# Patient Record
Sex: Female | Born: 1955
Health system: Southern US, Community
[De-identification: ages and names within clinical notes are randomized; demographics above are authoritative.]

## PROBLEM LIST (undated history)

## (undated) DIAGNOSIS — I214 Non-ST elevation (NSTEMI) myocardial infarction: Secondary | ICD-10-CM

## (undated) DIAGNOSIS — G35D Multiple sclerosis, unspecified: Secondary | ICD-10-CM

## (undated) DIAGNOSIS — Z86010 Personal history of colonic polyps: Secondary | ICD-10-CM

## (undated) DIAGNOSIS — C4491 Basal cell carcinoma of skin, unspecified: Secondary | ICD-10-CM

## (undated) DIAGNOSIS — R011 Cardiac murmur, unspecified: Secondary | ICD-10-CM

## (undated) DIAGNOSIS — E039 Hypothyroidism, unspecified: Secondary | ICD-10-CM

## (undated) DIAGNOSIS — Z87891 Personal history of nicotine dependence: Secondary | ICD-10-CM

## (undated) DIAGNOSIS — E119 Type 2 diabetes mellitus without complications: Secondary | ICD-10-CM

## (undated) DIAGNOSIS — Z720 Tobacco use: Secondary | ICD-10-CM

## (undated) DIAGNOSIS — I619 Nontraumatic intracerebral hemorrhage, unspecified: Secondary | ICD-10-CM

## (undated) DIAGNOSIS — G35 Multiple sclerosis: Secondary | ICD-10-CM

## (undated) DIAGNOSIS — B029 Zoster without complications: Secondary | ICD-10-CM

## (undated) DIAGNOSIS — IMO0002 Reserved for concepts with insufficient information to code with codable children: Secondary | ICD-10-CM

## (undated) DIAGNOSIS — E049 Nontoxic goiter, unspecified: Secondary | ICD-10-CM

## (undated) DIAGNOSIS — I10 Essential (primary) hypertension: Secondary | ICD-10-CM

## (undated) DIAGNOSIS — C4492 Squamous cell carcinoma of skin, unspecified: Secondary | ICD-10-CM

## (undated) HISTORY — DX: Multiple sclerosis, unspecified: G35.D

## (undated) HISTORY — DX: Essential (primary) hypertension: I10

## (undated) HISTORY — PX: TONSILLECTOMY: SUR1361

## (undated) HISTORY — DX: Basal cell carcinoma of skin, unspecified: C44.91

## (undated) HISTORY — DX: Type 2 diabetes mellitus without complications: E11.9

## (undated) HISTORY — DX: Reserved for concepts with insufficient information to code with codable children: IMO0002

## (undated) HISTORY — DX: Non-ST elevation (NSTEMI) myocardial infarction: I21.4

## (undated) HISTORY — DX: Personal history of colonic polyps: Z86.010

## (undated) HISTORY — PX: CORONARY ANGIOPLASTY WITH STENT PLACEMENT: SHX49

## (undated) HISTORY — PX: KNEE SURGERY: SHX244

## (undated) HISTORY — DX: Squamous cell carcinoma of skin, unspecified: C44.92

## (undated) HISTORY — DX: Zoster without complications: B02.9

## (undated) HISTORY — DX: Nontoxic goiter, unspecified: E04.9

## (undated) HISTORY — DX: Personal history of nicotine dependence: Z87.891

## (undated) HISTORY — DX: Tobacco use: Z72.0

## (undated) HISTORY — PX: VESICOVAGINAL FISTULA CLOSURE W/ TAH: SUR271

## (undated) HISTORY — DX: Hypothyroidism, unspecified: E03.9

## (undated) HISTORY — PX: OTHER SURGICAL HISTORY: SHX169

## (undated) HISTORY — DX: Multiple sclerosis: G35

---

## 2003-08-15 ENCOUNTER — Other Ambulatory Visit: Payer: Self-pay

## 2004-07-09 ENCOUNTER — Ambulatory Visit: Payer: Self-pay | Admitting: Unknown Physician Specialty

## 2005-07-12 ENCOUNTER — Ambulatory Visit: Payer: Self-pay | Admitting: Unknown Physician Specialty

## 2005-11-10 ENCOUNTER — Ambulatory Visit: Payer: Self-pay | Admitting: Unknown Physician Specialty

## 2005-11-12 ENCOUNTER — Ambulatory Visit: Payer: Self-pay | Admitting: Unknown Physician Specialty

## 2006-01-13 ENCOUNTER — Encounter: Admission: RE | Admit: 2006-01-13 | Discharge: 2006-01-13 | Payer: Self-pay | Admitting: Psychiatry

## 2006-07-14 ENCOUNTER — Ambulatory Visit: Payer: Self-pay | Admitting: Unknown Physician Specialty

## 2007-07-25 ENCOUNTER — Ambulatory Visit: Payer: Self-pay | Admitting: Unknown Physician Specialty

## 2008-07-30 ENCOUNTER — Ambulatory Visit: Payer: Self-pay | Admitting: Unknown Physician Specialty

## 2009-06-19 ENCOUNTER — Encounter: Payer: Self-pay | Admitting: Cardiovascular Disease

## 2009-07-02 HISTORY — PX: OTHER SURGICAL HISTORY: SHX169

## 2009-08-02 HISTORY — PX: CORONARY ANGIOPLASTY WITH STENT PLACEMENT: SHX49

## 2009-08-07 ENCOUNTER — Ambulatory Visit: Payer: Self-pay | Admitting: Unknown Physician Specialty

## 2009-09-29 ENCOUNTER — Ambulatory Visit: Payer: Self-pay | Admitting: Family Medicine

## 2009-09-29 ENCOUNTER — Ambulatory Visit: Payer: Self-pay | Admitting: Cardiovascular Disease

## 2009-09-29 ENCOUNTER — Inpatient Hospital Stay (HOSPITAL_COMMUNITY): Admission: EM | Admit: 2009-09-29 | Discharge: 2009-10-01 | Payer: Self-pay | Admitting: Emergency Medicine

## 2009-09-29 DIAGNOSIS — I214 Non-ST elevation (NSTEMI) myocardial infarction: Secondary | ICD-10-CM

## 2009-09-29 HISTORY — DX: Non-ST elevation (NSTEMI) myocardial infarction: I21.4

## 2009-10-03 ENCOUNTER — Encounter: Payer: Self-pay | Admitting: Cardiovascular Disease

## 2009-10-15 DIAGNOSIS — E039 Hypothyroidism, unspecified: Secondary | ICD-10-CM | POA: Insufficient documentation

## 2009-10-15 DIAGNOSIS — I10 Essential (primary) hypertension: Secondary | ICD-10-CM | POA: Insufficient documentation

## 2009-10-15 DIAGNOSIS — G35 Multiple sclerosis: Secondary | ICD-10-CM | POA: Insufficient documentation

## 2009-10-15 DIAGNOSIS — E119 Type 2 diabetes mellitus without complications: Secondary | ICD-10-CM | POA: Insufficient documentation

## 2009-10-15 DIAGNOSIS — E049 Nontoxic goiter, unspecified: Secondary | ICD-10-CM | POA: Insufficient documentation

## 2009-10-23 ENCOUNTER — Ambulatory Visit: Payer: Self-pay | Admitting: Cardiovascular Disease

## 2009-10-23 DIAGNOSIS — E782 Mixed hyperlipidemia: Secondary | ICD-10-CM | POA: Insufficient documentation

## 2009-10-23 DIAGNOSIS — R0602 Shortness of breath: Secondary | ICD-10-CM | POA: Insufficient documentation

## 2009-10-23 DIAGNOSIS — I2510 Atherosclerotic heart disease of native coronary artery without angina pectoris: Secondary | ICD-10-CM | POA: Insufficient documentation

## 2009-10-23 DIAGNOSIS — R011 Cardiac murmur, unspecified: Secondary | ICD-10-CM | POA: Insufficient documentation

## 2009-10-28 ENCOUNTER — Encounter: Payer: Self-pay | Admitting: Cardiovascular Disease

## 2009-11-06 ENCOUNTER — Telehealth: Payer: Self-pay | Admitting: Cardiovascular Disease

## 2009-12-11 ENCOUNTER — Telehealth (INDEPENDENT_AMBULATORY_CARE_PROVIDER_SITE_OTHER): Payer: Self-pay

## 2009-12-15 ENCOUNTER — Encounter (HOSPITAL_COMMUNITY): Admission: RE | Admit: 2009-12-15 | Discharge: 2009-12-15 | Payer: Self-pay | Admitting: Cardiovascular Disease

## 2009-12-15 ENCOUNTER — Ambulatory Visit: Payer: Self-pay | Admitting: Internal Medicine

## 2009-12-15 ENCOUNTER — Ambulatory Visit: Payer: Self-pay

## 2010-01-01 ENCOUNTER — Encounter: Payer: Self-pay | Admitting: Cardiovascular Disease

## 2010-01-13 ENCOUNTER — Ambulatory Visit: Payer: Self-pay | Admitting: Cardiovascular Disease

## 2010-07-16 ENCOUNTER — Encounter: Payer: Self-pay | Admitting: Cardiovascular Disease

## 2010-07-22 ENCOUNTER — Ambulatory Visit: Payer: Self-pay | Admitting: Cardiovascular Disease

## 2010-08-02 DIAGNOSIS — Z860100 Personal history of colon polyps, unspecified: Secondary | ICD-10-CM

## 2010-08-02 HISTORY — DX: Personal history of colon polyps, unspecified: Z86.0100

## 2010-08-02 HISTORY — DX: Personal history of colonic polyps: Z86.010

## 2010-08-10 ENCOUNTER — Ambulatory Visit: Payer: Self-pay | Admitting: Unknown Physician Specialty

## 2010-09-01 NOTE — Assessment & Plan Note (Signed)
Summary: per check out/sf   Visit Type:  Follow-up  CC:  c/o chest burning .  History of Present Illness: Daisy Bennett is seen today post hospital D/C.  She had a non ST elevation MI with subsequent stenting of an OM.  She has diffuse 3VD with a total right that is collateralized.  She had smoewhat atypical presentation with her DM.  She had a myriad of questions.  I told her to wait on her final dental implant surgery fo r3 months but a cleaning was ok.  She has a benign systolic murmur and does not need SBE prophylaxis.  She does not want to do cardiac rehab.  She is walking two miles/day and is motivated so I think this is ok.  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  She continues to have some exertional dyspnea. Recent myovue reviewed and showed no significant ishcemia.  .  She had labs at Dr Elige Ko office and they were reviewed.  HbA1c 6.0.  LDL 100 LFT's ok and TSH mildly elevated now on low dose synthroid. She denies SSCP, edema, palpitations.  Her cath site has healed well  She has a lot of gas with fish oil and I told her to try a different formulations  Current Problems (verified): 1)  Cardiac Murmur  (ICD-785.2) 2)  Mixed Hyperlipidemia  (ICD-272.2) 3)  Cad  (ICD-414.00) 4)  Dyspnea  (ICD-786.05) 5)  Multiple Sclerosis  (ICD-340) 6)  Hypertension  (ICD-401.9) 7)  Dm  (ICD-250.00) 8)  Goiter  (ICD-240.9) 9)  Hypothyroidism  (ICD-244.9)  Current Medications (verified): 1)  Lotrel 10-20 Mg Caps (Amlodipine Besy-Benazepril Hcl) .[name].. 1 Tab By Mouth Once Daily 2)  Metformin Hcl 500 Mg Tabs (Metformin Hcl) .[name].. 1 Tab By Mouth Once Daily 3)  Levothroid 100 Mcg Tabs (Levothyroxine Sodium) .[name].. 1 Tab By Mouth Once Daily 4)  Neurontin 600 Mg Tabs (Gabapentin) .[name].. 1  Tab By Mouth Three Times A Day 5)  Amitriptyline Hcl 50 Mg Tabs (Amitriptyline Hcl) .[name].. 1 Once Daily 6)  Carvedilol 3.125 Mg Tabs (Carvedilol) .... Take One Tablet By Mouth Twice A Day 7)  Crestor 10 Mg  Tabs (Rosuvastatin Calcium) .... Take One Tablet By Mouth Daily. 8)  Aspirin Ec 325 Mg Tbec (Aspirin) .... Take One Tablet By Mouth Daily 9)  Effient 10 Mg Tabs (Prasugrel Hcl) .[name].. 1 Tab By Mouth Once Daily 10)  Nitrostat 0.4 Mg Subl (Nitroglycerin) .[name].. 1 Tablet Under Tongue At Onset of Chest Pain; You May Repeat Every 5 Minutes For Up To 3 Doses. 11)  Multivitamins   Tabs (Multiple Vitamin) .[name].. 1 Tab By Mouth Once Daily 12)  Prilosec Otc 20 Mg Tbec (Omeprazole Magnesium) .[name].. 1 Tab By Mouth Once Daily 13)  Fish Oil   Oil (Fish Oil) .[name].. 1  Tab By Mouth Once Daily 14)  Cinnamon 2000 Mg Caps (Cinnamon) .... Daily 15)  Effexor Xr 37.5 Mg Xr24h-Cap (Venlafaxine Hcl) .[name].. 1  Once Daily 16)  Betaseron 0.3 Mg Solr (Interferon Beta-1b) .... 0.25 Mg Every Other Day Inj Subq  Allergies (verified): No Known Drug Allergies  Past History:  Past Medical History: Last updated: 10/15/2009 Current Problems:  SEMI: 09/29/09:  Total RCA with stent to OM branch. MULTIPLE SCLEROSIS (ICD-340) HYPERTENSION (ICD-401.9) DM (ICD-250.00) GOITER (ICD-240.9) HYPOTHYROIDISM (ICD-244.9) Non-ST-segment elevation myocardial infarction.   Past Surgical History: Last updated: 10/15/2009  The patient did have cataract surgery in  December 2010.   She also had  a hysterectomy  2 C-sections  tonsillectomy.      Family History: Last updated: 10/15/2009   Her mother has hypertension.  Father is deceased   recently within the last 2 weeks, died of congestive heart failure   complications.  She also has a brother who has multiple aneurysms in his   body, which include his thoracic aneurysm and abdominal aneurysm.      REVIEW OF SYSTEMS:  The patient was only positive for what was said in   HPI.      Social History: Last updated: 10/15/2009  The patient lives with husband.  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  She does not smoke, quit   more than 2 years ago, but does have a  history of smoking 1 pack per day   off and on for approximately 10 years.  Occasional alcohol use and no   drugs.   Review of Systems       Denies fever, malais, weight loss, blurry vision, decreased visual acuity, cough, sputum, SOB, hemoptysis, pleuritic pain, palpitaitons, heartburn, abdominal pain, melena, lower extremity edema, claudication, or rash.   Vital Signs:  Patient profile:   55 year old female Height:      66 inches Weight:      187 pounds BMI:     30.29 Pulse rate:   69 / minute Pulse rhythm:   regular BP sitting:   154 / 80  (left arm) Cuff size:   large  Vitals Entered By: Scherrie Bateman, LPN (January 13, 2010 2:02 PM)  Physical Exam  General:  Affect appropriate Healthy:  appears stated age HEENT: normal Neck supple with no adenopathy JVP normal no bruits no thyromegaly Lungs clear with no wheezing and good diaphragmatic motion Heart:  S1/S2 sytolic  murmur no ,rub, gallop or click PMI normal Abdomen: benighn, BS positve, no tenderness, no AAA no bruit.  No HSM or HJR Distal pulses intact with no bruits No edema Neuro non-focal Skin warm and dry    Impression & Recommendations:  Problem # 1:  CARDIAC MURMUR (ICD-785.2) Benign no need for echo Her updated medication list for this problem includes:    Lotrel 10-20 Mg Caps (Amlodipine besy-benazepril hcl) .[name]... 1 tab by mouth once daily    Carvedilol 3.125 Mg Tabs (Carvedilol) .[name]... Take one tablet by mouth twice a day    Nitrostat 0.4 Mg Subl (Nitroglycerin) .[name]... 1 tablet under tongue at onset of chest pain; you may repeat every 5 minutes for up to 3 doses.  Problem # 2:  MIXED HYPERLIPIDEMIA (ICD-272.2)  At goal continue statin Her updated medication list for this problem includes:    Crestor 10 Mg Tabs (Rosuvastatin calcium) .[name]... Take one tablet by mouth daily.  Her updated medication list for this problem includes:    Crestor 10 Mg Tabs (Rosuvastatin calcium) .[name]... Take one tablet by mouth  daily.  Problem # 3:  CAD (ICD-414.00) Stable no angina and nonischemic myovue 5/11  Normal ECG today Her updated medication list for this problem includes:    Lotrel 10-20 Mg Caps (Amlodipine besy-benazepril hcl) .[name]... 1 tab by mouth once daily    Carvedilol 3.125 Mg Tabs (Carvedilol) .[name]... Take one tablet by mouth twice a day    Aspirin Ec 325 Mg Tbec (Aspirin) .[name]... Take one tablet by mouth daily    Effient 10 Mg Tabs (Prasugrel hcl) .[name]... 1 tab by mouth once daily    Nitrostat 0.4  Mg Subl (Nitroglycerin) .[name]... 1 tablet under tongue at onset of chest pain; you may repeat every 5 minutes for up to 3 doses.  Problem # 4:  HYPERTENSION (ICD-401.9)  Well controlled Her updated medication list for this problem includes:    Lotrel 10-20 Mg Caps (Amlodipine besy-benazepril hcl) .[name]... 1 tab by mouth once daily    Carvedilol 3.125 Mg Tabs (Carvedilol) .[name]... Take one tablet by mouth twice a day    Aspirin Ec 325 Mg Tbec (Aspirin) .[name]... Take one tablet by mouth daily  Orders: EKG w/ Interpretation (93000)  Her updated medication list for this problem includes:    Lotrel 10-20 Mg Caps (Amlodipine besy-benazepril hcl) .[name]... 1 tab by mouth once daily    Carvedilol 3.125 Mg Tabs (Carvedilol) .[name]... Take one tablet by mouth twice a day    Aspirin Ec 325 Mg Tbec (Aspirin) .[name]... Take one tablet by mouth daily  Patient Instructions: 1)  Your physician recommends that you schedule a follow-up appointment in: 6 months with dr Eden Emms 2)  Your physician recommends that you continue on your current medications as directed. Please refer to the Current Medication list given to you today.   EKG Report  Procedure date:  01/13/2010  Findings:      NSR 73 Normal ECG

## 2010-09-01 NOTE — Assessment & Plan Note (Signed)
Summary: wt 182/c r/s/786.05/nishan/uch prec. req/saf  Nuclear Med Background Indications for Stress Test: Evaluation for Ischemia, Stent Patency  Indications Comments: Discharged 10/02/09 NSTEMI, assess to rule out large residual ischemic burden.  History: Heart Catheterization, Myocardial Infarction, Stents  History Comments: 09/29/09 NSTEMI> Cath: diffuse 3 VD with total RCA with collaterals>Stent-OM2, EF=55%.  Symptoms: Chest Pain, DOE, Palpitations  Symptoms Comments: Last episode of CP:1 month ago, "burning".   Nuclear Pre-Procedure Cardiac Risk Factors: History of Smoking, Hypertension, Lipids, NIDDM, Overweight Caffeine/Decaff Intake: none Lungs: Clear IV 0.9% NS with Angio Cath: 22g     IV Site: (L) inner wrist IV Started by: Burna Mortimer Deal RT-N Chest Size (in) 38     Cup Size C     Height (in): 66 Weight (lb): 186 BMI: 30.13  Nuclear Med Study 1 or 2 day study:  1 day     Stress Test Type:  Stress Reading MD:  Dietrich Pates, MD     Referring MD:  Charlton Haws, MD Resting Radionuclide:  Technetium 72m Tetrofosmin     Resting Radionuclide Dose:  11 mCi  Stress Radionuclide:  Technetium 79m Tetrofosmin     Stress Radionuclide Dose:  33 mCi   Stress Protocol Exercise Time (min):  11:00 min     Max HR:  148 bpm     Predicted Max HR:  165 bpm  Max Systolic BP: 186 mm Hg     Percent Max HR:  89.70 %     METS: 13.5 Rate Pressure Product:  45409    Stress Test Technologist:  Rea College CMA-N     Nuclear Technologist:  Domenic Polite CNMT  Rest Procedure  Myocardial perfusion imaging was performed at rest 45 minutes following the intravenous administration of Myoview Technetium 52m Tetrofosmin.  Stress Procedure  The patient exercised for eleven minutes.  The patient stopped due to fatigue and denied any chest pain.  There were no diagnostic ST-T wave changes, only nonspecific changes and occasional PAC's in recovery.  Myoview was injected at peak exercise and myocardial  perfusion imaging was performed after a brief delay.  QPS Raw Data Images:  Soft tissue (diaphragm, breast tissue) surround heart. Stress Images:  Normal perfusion and mild apical thinning. Rest Images:  No significant change from the stress images Subtraction (SDS):  No evidence of ischemia. Transient Ischemic Dilatation:  1.04  (Normal <1.22)  Lung/Heart Ratio:  .26  (Normal <0.45)  Quantitative Gated Spect Images QGS EDV:  88 ml QGS ESV:  23 ml QGS EF:  74 %   Overall Impression  Exercise Capacity: Excellent exercise capacity. BP Response: Normal blood pressure response. Clinical Symptoms: No chest pain ECG Impression: 1 mm flat ST depression in Stage III (leadsV5/V6  Increased to 2 mm in Stage IV (leads V4-V5)  1 mm flat to upsloping ST depression III, AVF in recovery.  All  normalizing by 2 min recovery. Overall Impression Comments: Clincically negatvie.  Electrically positive.  Note excellent exercise tolerance.  Myoview scan with normal perfusion.  Appended Document: wt 182/c r/s/786.05/nishan/uch prec. req/saf low risk scan continue medical Rx  Appended Document: wt 182/c r/s/786.05/nishan/uch prec. req/saf pt aware of results

## 2010-09-01 NOTE — Miscellaneous (Signed)
Summary: [facility] Cardiac Physician Order/Treatment Plan  [facility] Card[facility] Physician Order/Treatment Plan   Imported By: Roderic Ovens 10/17/2009 14:25:07  _____________________________________________________________________  External Attachment:    Type:   Image     Comment:   External Document

## 2010-09-01 NOTE — Progress Notes (Signed)
Summary: Nuc. Pre-Procedure  Phone Note Outgoing Call Call back at [facility] Phone [phone]   Call placed by: Irean Hong, RN,  Dec 11, 2009 12:05 PM Summary of Call: Reviewed information on Myoview Information Sheet (see scanned document for further details).  Spoke with patient.     Nuclear Med Background Indications for Stress Test: Evaluation for Ischemia, Stent Patency  Indications Comments: Discharged 10/02/09 NSTEMI, assess to rule out large residual ischemic burden.  History: Heart Catheterization, Myocardial Infarction, Stents  History Comments: 09/29/09 NSTEMI> Cath: diffuse 3 VD with total RCA with collaterals> stent OM2, EF=55%.  Symptoms: DOE    Nuclear Pre-Procedure Cardiac Risk Factors: History of Smoking, Hypertension, Lipids, NIDDM Height (in): 66

## 2010-09-01 NOTE — Progress Notes (Signed)
Summary: PT REQUEST CALL FEELING DEPRESSED QUESTION ABOT MEDICATIONS  Phone Note Call from Patient Call back at Home Phone [phone]   Caller: Patient Reason for Call: Talk to Nurse Initial call taken by: Judie Grieve,  November 06, 2009 9:34 AM  Follow-up for Phone Call        spoke with pt, when she left the hosp her estradiol was stopped. she is now in full menopause, not sleeping and can not stop crying. pt states her GYN had told her she could give her something for the menopause that was not hormones. the pt to call GYN Deliah Goody, RN  November 06, 2009 9:40 AM

## 2010-09-01 NOTE — Assessment & Plan Note (Signed)
Summary: post cath/lg   History of Present Illness: Daisy Bennett is seen today post hospital D/C.  She had a non ST elevation MI with subsequent stenting of an OM.  She has diffuse 3VD with a total right that is collateralized.  She had smoewhat atypical presentation with her DM.  She had a myriad of questions.  I told her to wait on her final dental implant surgery fo r3 months but a cleaning was ok.  She has a benign systolic murmur and does not need SBE prophylaxis.  She does not want to do cardiac rehab.  She is walking two miles/day and is motivated so I think this is ok.  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  She continues to have some exertional dyspnea.  Since she was not completly revascularized I think it would be good to do an 8 week myovue to R/O any large residual ischemic burden.  She had labs at Dr Elige Ko office and they were reviewed.  HbA1c 6.0.  LDL 100 LFT's ok and TSH mildly elevated now on low dose synthroid. She denies SSCP, edema, palpitations.  Her cath site has healed well  Current Problems (verified): 1)  Dyspnea  (ICD-786.05) 2)  Multiple Sclerosis  (ICD-340) 3)  Hypertension  (ICD-401.9) 4)  Dm  (ICD-250.00) 5)  Goiter  (ICD-240.9) 6)  Hypothyroidism  (ICD-244.9)  Current Medications (verified): 1)  Lotrel 10-20 Mg Caps (Amlodipine Besy-Benazepril Hcl) .[name].. 1 Tab By Mouth Once Daily 2)  Metformin Hcl 500 Mg Tabs (Metformin Hcl) .[name].. 1 Tab By Mouth Once Daily 3)  Levothroid 100 Mcg Tabs (Levothyroxine Sodium) .[name].. 1 Tab By Mouth Once Daily 4)  Neurontin 600 Mg Tabs (Gabapentin) .[name].. 1  Tab By Mouth Three Times A Day 5)  Amitriptyline Hcl 25 Mg Tabs (Amitriptyline Hcl) .[name].. 1 Tab By Mouth Once Daily 6)  Carvedilol 3.125 Mg Tabs (Carvedilol) .... Take One Tablet By Mouth Twice A Day 7)  Crestor 10 Mg Tabs (Rosuvastatin Calcium) .... Take One Tablet By Mouth Daily. 8)  Aspirin Ec 325 Mg Tbec (Aspirin) .... Take One Tablet By Mouth Daily 9)   Effient 10 Mg Tabs (Prasugrel Hcl) .[name].. 1 Tab By Mouth Once Daily 10)  Nitrostat 0.4 Mg Subl (Nitroglycerin) .[name].. 1 Tablet Under Tongue At Onset of Chest Pain; You May Repeat Every 5 Minutes For Up To 3 Doses. 11)  Multivitamins   Tabs (Multiple Vitamin) .[name].. 1 Tab By Mouth Once Daily 12)  Prilosec Otc 20 Mg Tbec (Omeprazole Magnesium) .[name].. 1 Tab By Mouth Once Daily 13)  Fish Oil   Oil (Fish Oil) .[name].. 1  Tab By Mouth Once Daily 14)  Cinnamon 2000 Mg Caps (Cinnamon) .... Daily  Allergies (verified): No Known Drug Allergies  Past History:  Past Medical History: Last updated: 10/15/2009 Current Problems:  SEMI: 09/29/09:  Total RCA with stent to OM branch. MULTIPLE SCLEROSIS (ICD-340) HYPERTENSION (ICD-401.9) DM (ICD-250.00) GOITER (ICD-240.9) HYPOTHYROIDISM (ICD-244.9) Non-ST-segment elevation myocardial infarction.   Past Surgical History: Last updated: 10/15/2009  The patient did have cataract surgery in  December 2010.   She also had a hysterectomy  2 C-sections  tonsillectomy.      Family History: Last updated: 10/15/2009   Her mother has hypertension.  Father is deceased   recently within the last 2 weeks, died of congestive heart failure   complications.  She also has a brother who has multiple aneurysms in his   body, which include  his thoracic aneurysm and abdominal aneurysm.      REVIEW OF SYSTEMS:  The patient was only positive for what was said in   HPI.      Social History: Last updated: 10/15/2009  The patient lives with husband.  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  She does not smoke, quit   more than 2 years ago, but does have a history of smoking 1 pack per day   off and on for approximately 10 years.  Occasional alcohol use and no   drugs.   Review of Systems       Denies fever, malais, weight loss, blurry vision, decreased visual acuity, cough, sputum, SOB, hemoptysis, pleuritic pain, palpitaitons, heartburn, abdominal  pain, melena, lower extremity edema, claudication, or rash. All other systems reviewed and negative except as noted in HPI  Vital Signs:  Patient profile:   55 year old female Height:      66 inches Weight:      182 pounds BMI:     29.48 Pulse rate:   74 / minute Resp:     12 per minute BP sitting:   140 / 80  (left arm)  Vitals Entered By: Kem Parkinson (October 23, 2009 1:44 PM)  Physical Exam  General:  Affect appropriate Healthy:  appears stated age HEENT: normal Neck supple with no adenopathy JVP normal no bruits no thyromegaly Lungs clear with no wheezing and good diaphragmatic motion Heart:  S1/S2 SEMmurmur,rub, gallop or click PMI normal Abdomen: benighn, BS positve, no tenderness, no AAA no bruit.  No HSM or HJR Distal pulses intact with no bruits No edema Neuro non-focal Skin warm and dry    Impression & Recommendations:  Problem # 1:  HYPERTENSION (ICD-401.9) Well contorlled Her updated medication list for this problem includes:    Lotrel 10-20 Mg Caps (Amlodipine besy-benazepril hcl) .[name]... 1 tab by mouth once daily    Carvedilol 3.125 Mg Tabs (Carvedilol) .[name]... Take one tablet by mouth twice a day    Aspirin Ec 325 Mg Tbec (Aspirin) .[name]... Take one tablet by mouth daily  Problem # 2:  CAD (ICD-414.00) SEMI with stent OM. Effient for residual 3VD and collateralized RCA.  8 week myovue.  Nitro to carry Her updated medication list for this problem includes:    Lotrel 10-20 Mg Caps (Amlodipine besy-benazepril hcl) .[name]... 1 tab by mouth once daily    Carvedilol 3.125 Mg Tabs (Carvedilol) .[name]... Take one tablet by mouth twice a day    Aspirin Ec 325 Mg Tbec (Aspirin) .[name]... Take one tablet by mouth daily    Effient 10 Mg Tabs (Prasugrel hcl) .[name]... 1 tab by mouth once daily    Nitrostat 0.4 Mg Subl (Nitroglycerin) .[name]... 1 tablet under tongue at onset of chest pain; you may repeat every 5 minutes for up to 3 doses.  Problem # 3:  DM (ICD-250.00) A1c in good range  low carb diet Her updated medication list for this problem includes:    Lotrel 10-20 Mg Caps (Amlodipine besy-benazepril hcl) .[name]... 1 tab by mouth once daily    Metformin Hcl 500 Mg Tabs (Metformin hcl) .[name]... 1 tab by mouth once daily    Aspirin Ec 325 Mg Tbec (Aspirin) .[name]... Take one tablet by mouth daily  Problem # 4:  MIXED HYPERLIPIDEMIA (ICD-272.2) Continue statin.  Consdier increasing dose next visit Her updated medication list for this problem includes:    Crestor 10 Mg Tabs (Rosuvastatin  calcium) .[name]... Take one tablet by mouth daily.  Problem # 5:  HYPOTHYROIDISM (ICD-244.9) Careful with replacement On low dose synthroid Her updated medication list for this problem includes:    Levothroid 100 Mcg Tabs (Levothyroxine sodium) .[name]... 1 tab by mouth once daily  Problem # 6:  CARDIAC MURMUR (ICD-785.2) Benign no SBE prophlaxis.  But with MI postpone dental implant for at least 3 months Her updated medication list for this problem includes:    Lotrel 10-20 Mg Caps (Amlodipine besy-benazepril hcl) .[name]... 1 tab by mouth once daily    Carvedilol 3.125 Mg Tabs (Carvedilol) .[name]... Take one tablet by mouth twice a day    Nitrostat 0.4 Mg Subl (Nitroglycerin) .[name]... 1 tablet under tongue at onset of chest pain; you may repeat every 5 minutes for up to 3 doses.  Other Orders: Nuclear Stress Test (Nuc Stress Test)  Patient Instructions: 1)  Your physician has requested that you have an exercise stress myoview in 8 weeks.  For further information please visit [email or web address]  Please follow instruction sheet, as given. 2)  Your physician recommends that you schedule a follow-up appointment in: 12 weeks with Dr Eden Emms 3)  Your physician recommends that you continue on your current medications as directed. Please refer to the Current Medication list given to you today. Prescriptions: EFFIENT 10 MG TABS (PRASUGREL HCL) 1 tab by mouth once daily  #90 x 3   Entered by:   Optometrist BSN    Authorized by:   Colon Branch, MD, [facility]   Signed by:   Gypsy Balsam RN BSN on 10/23/2009   Method used:   Electronically to        [facility]* (mail-order)             ,          Ph: [phone]       Fax: [phone]   RxID:   [id number] CRESTOR 10 MG TABS (ROSUVASTATIN CALCIUM) Take one tablet by mouth daily.  #90 x 3   Entered by:   Optometrist BSN   Authorized by:   Colon Branch, MD, [facility]   Signed by:   Gypsy Balsam RN BSN on 10/23/2009   Method used:   Electronically to        [facility]* (mail-order)             ,          Ph: [phone]       Fax: [phone]   RxID:   [id number] CARVEDILOL 3.125 MG TABS (CARVEDILOL) Take one tablet by mouth twice a day  #180 x 3   Entered by:   Optometrist BSN   Authorized by:   Colon Branch, MD, [facility]   Signed by:   Gypsy Balsam RN BSN on 10/23/2009   Method used:   Electronically to        [facility]* (mail-order)             ,          Ph: [phone]       Fax: [phone]   RxID:   [id number]    EKG Report  Procedure date:  10/23/2009  Findings:      NSR 77 Normal ECG

## 2010-09-01 NOTE — Miscellaneous (Signed)
Summary: [facility] Cardiac Progress Note  [facility] Cardiac Progress Note   Imported By: Roderic Ovens 11/17/2009 11:12:52  _____________________________________________________________________  External Attachment:    Type:   Image     Comment:   External Document

## 2010-09-03 NOTE — Assessment & Plan Note (Signed)
Summary: 6 mo f/u ./cy   History of Present Illness: Daisy Bennett is seen today post hospital D/C 2/11 .  She had a non ST elevation MI with subsequent stenting of an OM.  She has diffuse 3VD with a total right that is collateralized.  She had smoewhat atypical presentation with her DM.  She had a myriad of questions.  I told her to wait on her final dental implant surgery fo r3 months but a cleaning was ok.  She has a benign systolic murmur and does not need SBE prophylaxis.  She does not want to do cardiac rehab.  She is walking two miles/day and is motivated so I think this is ok.  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  She continues to have some exertional dyspnea. Recent myovue 5/11  reviewed and showed no significant ishcemia.  .  She had labs at Dr Elige Ko office and they were reviewed.  HbA1c 6.0.  LDL 100 LFT's ok and TSH mildly elevated now on low dose synthroid. She denies SSCP, edema, palpitations.  Her cath site has healed well  Reviewed labs from Dr Letta Kocher office. 07/16/10: K 4.0 LFT;s mildly elevated AST 55 ULN 39   ALT 47 ULN 38 Cr .7 LDL 58  BP been high.  Discussed changing lotrel to Hyzaar  Current Problems (verified): 1)  Cardiac Murmur  (ICD-785.2) 2)  Mixed Hyperlipidemia  (ICD-272.2) 3)  Cad  (ICD-414.00) 4)  Dyspnea  (ICD-786.05) 5)  Multiple Sclerosis  (ICD-340) 6)  Hypertension  (ICD-401.9) 7)  Dm  (ICD-250.00) 8)  Goiter  (ICD-240.9) 9)  Hypothyroidism  (ICD-244.9)  Current Medications (verified): 1)  Lotrel 10-20 Mg Caps (Amlodipine Besy-Benazepril Hcl) .[name].. 1 Tab By Mouth Once Daily 2)  Metformin Hcl 500 Mg Tabs (Metformin Hcl) .[name].. 1 Tab By Mouth Two Times A Day 3)  Levothroid 100 Mcg Tabs (Levothyroxine Sodium) .[name].. 1 Tab By Mouth Once Daily 4)  Neurontin 600 Mg Tabs (Gabapentin) .[name].. 1  Tab By Mouth Three Times A Day 5)  Amitriptyline Hcl 50 Mg Tabs (Amitriptyline Hcl) .[name].. 1 Once Daily 6)  Carvedilol 3.125 Mg Tabs (Carvedilol) ....  Take One Tablet By Mouth Twice A Day 7)  Crestor 10 Mg Tabs (Rosuvastatin Calcium) .... Take One Tablet By Mouth Daily. 8)  Aspirin Ec 325 Mg Tbec (Aspirin) .... Take One Tablet By Mouth Daily 9)  Effient 10 Mg Tabs (Prasugrel Hcl) .[name].. 1 Tab By Mouth Once Daily 10)  Nitrostat 0.4 Mg Subl (Nitroglycerin) .[name].. 1 Tablet Under Tongue At Onset of Chest Pain; You May Repeat Every 5 Minutes For Up To 3 Doses. 11)  Multivitamins   Tabs (Multiple Vitamin) .[name].. 1 Tab By Mouth Once Daily 12)  Prilosec Otc 20 Mg Tbec (Omeprazole Magnesium) .[name].. 1 Tab By Mouth Once Daily 13)  Fish Oil   Oil (Fish Oil) .[name].. 1  Tab By Mouth Once Daily 14)  Cinnamon 2000 Mg Caps (Cinnamon) .... Daily 15)  Effexor Xr 37.5 Mg Xr24h-Cap (Venlafaxine Hcl) .[name].. 1  Once Daily 16)  Betaseron 0.3 Mg Solr (Interferon Beta-1b) .... 0.25 Mg Every Other Day Inj Subq  Allergies (verified): No Known Drug Allergies  Past History:  Past Medical History: Last updated: 10/15/2009 Current Problems:  SEMI: 09/29/09:  Total RCA with stent to OM branch. MULTIPLE SCLEROSIS (ICD-340) HYPERTENSION (ICD-401.9) DM (ICD-250.00) GOITER (ICD-240.9) HYPOTHYROIDISM (ICD-244.9) Non-ST-segment elevation myocardial infarction.   Past Surgical History: Last updated: 10/15/2009  The patient  did have cataract surgery in  December 2010.   She also had a hysterectomy  2 C-sections  tonsillectomy.      Family History: Last updated: 10/15/2009   Her mother has hypertension.  Father is deceased   recently within the last 2 weeks, died of congestive heart failure   complications.  She also has a brother who has multiple aneurysms in his   body, which include his thoracic aneurysm and abdominal aneurysm.      REVIEW OF SYSTEMS:  The patient was only positive for what was said in   HPI.      Social History: Last updated: 10/15/2009  The patient lives with husband.  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  She does not  smoke, quit   more than 2 years ago, but does have a history of smoking 1 pack per day   off and on for approximately 10 years.  Occasional alcohol use and no   drugs.   Review of Systems       Denies fever, malais, weight loss, blurry vision, decreased visual acuity, cough, sputum,  hemoptysis, pleuritic pain, palpitaitons, heartburn, abdominal pain, melena, lower extremity edema, claudication, or rash.   Vital Signs:  Patient profile:   55 year old female Height:      66 inches Weight:      188 pounds BMI:     30.45 Pulse rate:   70 / minute Resp:     14 per minute BP sitting:   160 / 78  (left arm)  Vitals Entered By: Kem Parkinson (July 22, 2010 9:12 AM)  Physical Exam  General:  Affect appropriate Healthy:  appears stated age HEENT: normal Neck supple with no adenopathy JVP normal no bruits no thyromegaly Lungs clear with no wheezing and good diaphragmatic motion Heart:  S1/S2 SEM  murmur,rub, gallop or click PMI normal Abdomen: benighn, BS positve, no tenderness, no AAA no bruit.  No HSM or HJR Distal pulses intact with no bruits No edema Neuro non-focal Skin warm and dry    Impression & Recommendations:  Problem # 1:  HYPERTENSION (ICD-401.9) Adjust meds.  Low sodium diet  F/U 8 weeks Her updated medication list for this problem includes:    Hyzaar 100-25 Mg Tabs (Losartan potassium-hctz) .[name]... Take 1 tablet daily    Carvedilol 3.125 Mg Tabs (Carvedilol) .[name]... Take one tablet by mouth twice a day    Aspirin Ec 325 Mg Tbec (Aspirin) .[name]... Take one tablet by mouth daily  Problem # 2:  MIXED HYPERLIPIDEMIA (ICD-272.2) Follow LFT;s may need to decrease crestor LDL is fine Her updated medication list for this problem includes:    Crestor 10 Mg Tabs (Rosuvastatin calcium) .[name]... Take one tablet by mouth daily.  Problem # 3:  MULTIPLE SCLEROSIS (ICD-340) Stable  Elevated LFTs may be related to [facility]  F/U neurology  Problem # 4:  CAD  (ICD-414.00) Stable no angina nonischemic myovue 5/11  [place] EFFIENT  Copay card give n Her updated medication list for this problem includes:    Carvedilol 3.125 Mg Tabs (Carvedilol) .[name]... Take one tablet by mouth twice a day    Aspirin Ec 325 Mg Tbec (Aspirin) .[name]... Take one tablet by mouth daily    Effient 10 Mg Tabs (Prasugrel hcl) .[name]... 1 tab by mouth once daily    Nitrostat 0.4 Mg Subl (Nitroglycerin) .[name]... 1 tablet under tongue at onset of chest pain; you may repeat every 5  minutes for up to 3 doses.  Problem # 5:  CARDIAC MURMUR (ICD-785.2) Benign SEM  consider echo in a year Her updated medication list for this problem includes:    Hyzaar 100-25 Mg Tabs (Losartan potassium-hctz) .[name]... Take 1 tablet daily    Carvedilol 3.125 Mg Tabs (Carvedilol) .[name]... Take one tablet by mouth twice a day    Nitrostat 0.4 Mg Subl (Nitroglycerin) .[name]... 1 tablet under tongue at onset of chest pain; you may repeat every 5 minutes for up to 3 doses.  Patient Instructions: 1)  Your physician recommends that you schedule a follow-up appointment in: 8 weeks with Dr. Eden Emms 09-23-10 at 9:00am 2)  Your physician has recommended you make the following change in your medication:  Prescriptions: HYZAAR 100-25 MG TABS (LOSARTAN POTASSIUM-HCTZ) Take 1 tablet daily  #30 x 3   Entered by:   Lisabeth Devoid RN   Authorized by:   Colon Branch, MD, [facility]   Signed by:   Lisabeth Devoid RN on 07/22/2010   Method used:   [facility]  [address] [phone][facility]ail)       [address] [place], [place]  [address]       Ph: [phone] or [phone]       Fax: [phone]   RxID:   [id number]

## 2010-09-23 ENCOUNTER — Encounter: Payer: Self-pay | Admitting: Cardiovascular Disease

## 2010-09-23 ENCOUNTER — Ambulatory Visit (INDEPENDENT_AMBULATORY_CARE_PROVIDER_SITE_OTHER): Payer: 59 | Admitting: Cardiovascular Disease

## 2010-09-23 DIAGNOSIS — E785 Hyperlipidemia, unspecified: Secondary | ICD-10-CM

## 2010-09-23 DIAGNOSIS — E119 Type 2 diabetes mellitus without complications: Secondary | ICD-10-CM

## 2010-09-23 DIAGNOSIS — I10 Essential (primary) hypertension: Secondary | ICD-10-CM

## 2010-09-23 DIAGNOSIS — I2510 Atherosclerotic heart disease of native coronary artery without angina pectoris: Secondary | ICD-10-CM

## 2010-09-29 NOTE — Assessment & Plan Note (Signed)
Summary: F8W/DFG/JT appt confirm=mj   CC:  check up.  History of Present Illness: Daisy Bennett is seen today post stenting 2/11  She had a non ST elevation MI with subsequent stenting of an OM.  She has diffuse 3VD with a total right that is collateralized.  She had smoewhat atypical presentation with her DM. [name]  She has a benign systolic murmur and does not need SBE prophylaxis. She is walking two miles/day and is motivated so I think this is ok.  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  She continues to have some exertional dyspnea. MJyovue 5/11  reviewed and showed no significant ischemia.  .  She had labs at Dr Elige Ko office  12/11 and they were reviewed.  HbA1c 6.0.  LDL 57 LFT's ok and TSH mildly elevated now on low dose synthroid. She denies SSCP, edema, palpitations.  Her cath site has healed well  Her BP has been running high and needs further Rx  Current Problems (verified): 1)  Cardiac Murmur  (ICD-785.2) 2)  Mixed Hyperlipidemia  (ICD-272.2) 3)  Cad  (ICD-414.00) 4)  Dyspnea  (ICD-786.05) 5)  Multiple Sclerosis  (ICD-340) 6)  Hypertension  (ICD-401.9) 7)  Dm  (ICD-250.00) 8)  Goiter  (ICD-240.9) 9)  Hypothyroidism  (ICD-244.9)  Current Medications (verified): 1)  Hyzaar 100-25 Mg Tabs (Losartan Potassium-Hctz) .... Take 1 Tablet Daily 2)  Metformin Hcl 500 Mg Tabs (Metformin Hcl) .[name].. 1 Tab By Mouth Two Times A Day 3)  Levothroid 100 Mcg Tabs (Levothyroxine Sodium) .[name].. 1 Tab By Mouth Once Daily 4)  Neurontin 600 Mg Tabs (Gabapentin) .[name].. 1  Tab By Mouth Three Times A Day 5)  Amitriptyline Hcl 50 Mg Tabs (Amitriptyline Hcl) .[name].. 1 Once Daily 6)  Carvedilol 3.125 Mg Tabs (Carvedilol) .... Take One Tablet By Mouth Twice A Day 7)  Crestor 10 Mg Tabs (Rosuvastatin Calcium) .... Take One Tablet By Mouth Daily. 8)  Aspirin Ec 325 Mg Tbec (Aspirin) .... Take One Tablet By Mouth Daily 9)  Effient 10 Mg Tabs (Prasugrel Hcl) .[name].. 1 Tab By Mouth Once Daily 10)   Nitrostat 0.4 Mg Subl (Nitroglycerin) .[name].. 1 Tablet Under Tongue At Onset of Chest Pain; You May Repeat Every 5 Minutes For Up To 3 Doses. 11)  Multivitamins   Tabs (Multiple Vitamin) .[name].. 1 Tab By Mouth Once Daily 12)  Prilosec Otc 20 Mg Tbec (Omeprazole Magnesium) .[name].. 1 Tab By Mouth Once Daily 13)  Fish Oil   Oil (Fish Oil) .[name].. 1  Tab By Mouth Once Daily 14)  Cinnamon 2000 Mg Caps (Cinnamon) .... Daily 15)  Effexor Xr 75 Mg Xr24h-Cap (Venlafaxine Hcl) .[name].. 1 Tab By Mouth Once Daily 16)  Betaseron 0.3 Mg Solr (Interferon Beta-1b) .... 0.25 Mg Every Other Day Inj Subq 17)  Lotemax 0.5 % Susp (Loteprednol Etabonate) .... As Directed 18)  Restasis 0.05 % Emul (Cyclosporine) .... As Diected 19)  Refresh Eye Lubricant .... As Directed  Allergies (verified): No Known Drug Allergies  Past History:  Past Medical History: Last updated: 10/15/2009 Current Problems:  SEMI: 09/29/09:  Total RCA with stent to OM branch. MULTIPLE SCLEROSIS (ICD-340) HYPERTENSION (ICD-401.9) DM (ICD-250.00) GOITER (ICD-240.9) HYPOTHYROIDISM (ICD-244.9) Non-ST-segment elevation myocardial infarction.   Past Surgical History: Last updated: 10/15/2009  The patient did have cataract surgery in  December 2010.   She also had a hysterectomy  2 C-sections  tonsillectomy.      Family History: Last updated: 10/15/2009  Her mother has hypertension.  Father is deceased   recently within the last 2 weeks, died of congestive heart failure   complications.  She also has a brother who has multiple aneurysms in his   body, which include his thoracic aneurysm and abdominal aneurysm.      REVIEW OF SYSTEMS:  The patient was only positive for what was said in   HPI.      Social History: Last updated: 10/15/2009  The patient lives with husband.  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  She does not smoke, quit   more than 2 years ago, but does have a history of smoking 1 pack per day   off  and on for approximately 10 years.  Occasional alcohol use and no   drugs.   Review of Systems       Denies fever, malais, weight loss, blurry vision, decreased visual acuity, cough, sputum, SOB, hemoptysis, pleuritic pain, palpitaitons, heartburn, abdominal pain, melena, lower extremity edema, claudication, or rash.   Vital Signs:  Patient profile:   55 year old female Height:      66 inches Weight:      188 pounds BMI:     30.45 Resp:     14 per minute BP sitting:   170 / 100  (left arm)  Vitals Entered By: Kem Parkinson (September 23, 2010 9:14 AM)  Physical Exam  General:  Affect appropriate Healthy:  appears stated age HEENT: normal Neck supple with no adenopathy JVP normal no bruits no thyromegaly Lungs clear with no wheezing and good diaphragmatic motion Heart:  S1/S2 soft systolic murmur no ,rub, gallop or click PMI normal Abdomen: benighn, BS positve, no tenderness, no AAA no bruit.  No HSM or HJR Distal pulses intact with no bruits No edema Neuro non-focal Skin warm and dry    Impression & Recommendations:  Problem # 1:  CARDIAC MURMUR (ICD-785.2) Benign Follow Her updated medication list for this problem includes:    Hyzaar 100-25 Mg Tabs (Losartan potassium-hctz) .[name]... Take 1 tablet daily    Carvedilol 3.125 Mg Tabs (Carvedilol) .[name]... Take one tablet by mouth twice a day    Nitrostat 0.4 Mg Subl (Nitroglycerin) .[name]... 1 tablet under tongue at onset of chest pain; you may repeat every 5 minutes for up to 3 doses.  Problem # 2:  MIXED HYPERLIPIDEMIA (ICD-272.2) Continue statin  Do not think she needs to be in Reveal trial Her updated medication list for this problem includes:    Crestor 10 Mg Tabs (Rosuvastatin calcium) .[name]... Take one tablet by mouth daily.  Problem # 3:  CAD (ICD-414.00) Stable no angina Her updated medication list for this problem includes:    Carvedilol 3.125 Mg Tabs (Carvedilol) .[name]... Take one tablet by mouth twice a day     Aspirin Ec 325 Mg Tbec (Aspirin) .[name]... Take one tablet by mouth daily    Effient 10 Mg Tabs (Prasugrel hcl) .[name]... 1 tab by mouth once daily    Nitrostat 0.4 Mg Subl (Nitroglycerin) .[name]... 1 tablet under tongue at onset of chest pain; you may repeat every 5 minutes for up to 3 doses.  Orders: EKG w/ Interpretation (93000)  Problem # 4:  HYPERTENSION (ICD-401.9) Suboptimal control  Add norvasc and increase coreg Her updated medication list for this problem includes:    Hyzaar 100-25 Mg Tabs (Losartan potassium-hctz) .[name]... Take 1 tablet daily    Carvedilol 3.125 Mg Tabs (Carvedilol) .[name]... Take  one tablet by mouth twice a day    Aspirin Ec 325 Mg Tbec (Aspirin) .[name]... Take one tablet by mouth daily  Orders: EKG w/ Interpretation ([address])  Appended Document: F8W/DFG/JT appt confirm=mj    Clinical Lists Changes  Medications: Changed medication from CARVEDILOL 3.125 MG TABS (CARVEDILOL) Take one tablet by mouth twice a day to CARVEDILOL 6.25 MG TABS (CARVEDILOL) Take one tablet by mouth twice a day - Signed Added new medication of AMLODIPINE BESYLATE 10 MG TABS (AMLODIPINE BESYLATE) Take one tablet by mouth daily - Signed Rx of CARVEDILOL 6.25 MG TABS (CARVEDILOL) Take one tablet by mouth twice a day;  #60 x 3;  Signed;  Entered by: Lisabeth Devoid RN;  Authorized by: Colon Branch, MD, [facility];  Method used: [facility]  [phone] [address] [place], [place], [place]  [address], Ph: [phone] or [phone], Fax: [phone] Rx of AMLODIPINE BESYLATE 10 MG TABS (AMLODIPINE BESYLATE) Take one tablet by mouth daily;  #30 x 3;  Signed;  Entered by: Lisabeth Devoid RN;  Authorized by: Colon Branch, MD, [facility];  Method used: [facility]  [address] [phone][facility] [address] [place], [place], [place]  [address], Ph: [phone] or [phone], Fax: [phone] Rx of HYZAAR 100-25 MG TABS (LOSARTAN POTASSIUM-HCTZ) Take 1 tablet daily;  #90 x 3;  Signed;  Entered by: Lisabeth Devoid RN;   Authorized by: Colon Branch, MD, [facility];  Method used: Faxed to [facility], , ,   , Ph: , Fax: [phone] Rx of CARVEDILOL 6.25 MG TABS (CARVEDILOL) Take one tablet by mouth twice a day;  #180 x 3;  Signed;  Entered by: Lisabeth Devoid RN;  Authorized by: Colon Branch, MD, [facility];  Method used: Faxed to [facility], , ,   , Ph: , Fax: [phone] Rx of AMLODIPINE BESYLATE 10 MG TABS (AMLODIPINE BESYLATE) Take one tablet by mouth daily;  #90 x 3;  Signed;  Entered by: Lisabeth Devoid RN;  Authorized by: Colon Branch, MD, [facility];  Method used: Faxed to [facility], , , [place]  , Ph: , Fax: [phone] Observations: Added new observation of PI CARDIO: Your physician recommends that you schedule a follow-up appointment in: 3 months with Dr. Eden Emms 12-07-10 at 9:00 Your physician has recommended you make the following change in your medication: increase carvedilol ( coreg)  start norvasc ( amlodipine) (09/23/2010 9:36)    Prescriptions: AMLODIPINE BESYLATE 10 MG TABS (AMLODIPINE BESYLATE) Take one tablet by mouth daily  #90 x 3   Entered by:   Lisabeth Devoid RN   Authorized by:   Colon Branch, MD, [facility]   Signed by:   Lisabeth Devoid RN on 09/23/2010   Method used:   Faxed to ...       [facility] (mail-order)             , [place]         Ph:        Fax: [phone]   RxID:   [id number] CARVEDILOL 6.25 MG TABS (CARVEDILOL) Take one tablet by mouth twice a day  #180 x 3   Entered by:   Lisabeth Devoid RN   Authorized by:   Colon Branch, MD, [facility]   Signed by:   Lisabeth Devoid RN on 09/23/2010   Method used:   Faxed to ...       [facility])             ,  [place]         Ph:        Fax: [phone]   RxID:   [id number] HYZAAR 100-25 MG TABS (LOSARTAN POTASSIUM-HCTZ) Take 1 tablet daily  #90 x 3   Entered by:   Lisabeth Devoid RN   Authorized by:   Colon Branch, MD, [facility]   Signed by:   Lisabeth Devoid RN on 09/23/2010   Method used:   Faxed to ...       [facility]  (mail-order)             , [place]         Ph:        Fax: [phone]   RxID:   [id number] AMLODIPINE BESYLATE 10 MG TABS (AMLODIPINE BESYLATE) Take one tablet by mouth daily  #30 x 3   Entered by:   Lisabeth Devoid RN   Authorized by:   Colon Branch, MD, [facility]   Signed by:   Lisabeth Devoid RN on 09/23/2010   Method used:   [facility]        [facility]  [address] [phone]* (retail)       [address]       [place], [place]  [address]       Ph: [phone] or [phone]       Fax: [phone]   RxID:   [id number] CARVEDILOL 6.25 MG TABS (CARVEDILOL) Take one tablet by mouth twice a day  #60 x 3   Entered by:   Lisabeth Devoid RN   Authorized by:   Colon Branch, MD, [facility]   Signed by:   Lisabeth Devoid RN on 09/23/2010   Method used:   [facility]        [facility]  [address] [phone]* (retail)       [address] [place], [place]  [address]       Ph: [phone] or [phone]       Fax: [phone]   RxID:   [id number]     Patient Instructions: 1)  Your physician recommends that you schedule a follow-up appointment in: 3 months with Dr. Eden Emms 12-07-10 at 9:00 2)  Your physician has recommended you make the following change in your medication: increase carvedilol ( coreg)  start norvasc ( amlodipine)

## 2010-10-22 LAB — TROPONIN I: Troponin I: 0.27 ng/mL — ABNORMAL HIGH (ref 0.00–0.06)

## 2010-10-22 LAB — URINALYSIS, ROUTINE W REFLEX MICROSCOPIC
Bilirubin Urine: NEGATIVE
Glucose, UA: NEGATIVE mg/dL
Hgb urine dipstick: NEGATIVE
Ketones, ur: NEGATIVE mg/dL
Nitrite: NEGATIVE
Protein, ur: NEGATIVE mg/dL
Specific Gravity, Urine: 1.003 — ABNORMAL LOW (ref 1.005–1.030)
Urobilinogen, UA: 0.2 mg/dL (ref 0.0–1.0)
pH: 5.5 (ref 5.0–8.0)

## 2010-10-22 LAB — BASIC METABOLIC PANEL
BUN: 10 mg/dL (ref 6–23)
CO2: 24 mEq/L (ref 19–32)
Calcium: 9.8 mg/dL (ref 8.4–10.5)
Chloride: 104 mEq/L (ref 96–112)
Creatinine, Ser: 0.73 mg/dL (ref 0.4–1.2)
GFR calc Af Amer: >60 mL/min (ref >60)
GFR calc non Af Amer: >60 mL/min (ref >60)
Glucose, Bld: 144 mg/dL — ABNORMAL HIGH (ref 70–99)
Potassium: 4.1 mEq/L (ref 3.5–5.1)
Sodium: 137 mEq/L (ref 135–145)

## 2010-10-22 LAB — POCT CARDIAC MARKERS
CKMB, poc: 1.1 ng/mL (ref 1.0–8.0)
CKMB, poc: 1.2 ng/mL (ref 1.0–8.0)
Myoglobin, poc: 49.3 ng/mL (ref 12–200)
Myoglobin, poc: 61.9 ng/mL (ref 12–200)
Troponin i, poc: 0.06 ng/mL (ref 0.00–0.09)
Troponin i, poc: <0.05 ng/mL (ref 0.00–0.09)

## 2010-10-22 LAB — CBC
HCT: 35.7 % — ABNORMAL LOW (ref 36.0–46.0)
Hemoglobin: 12.8 g/dL (ref 12.0–15.0)
MCHC: 35.9 g/dL (ref 30.0–36.0)
MCV: 94.5 fL (ref 78.0–100.0)
Platelets: 219 10*3/uL (ref 150–400)
RBC: 3.78 MIL/uL — ABNORMAL LOW (ref 3.87–5.11)
RDW: 11.8 % (ref 11.5–15.5)
WBC: 6.4 10*3/uL (ref 4.0–10.5)

## 2010-10-22 LAB — DIFFERENTIAL
Basophils Absolute: 0 10*3/uL (ref 0.0–0.1)
Basophils Relative: 1 % (ref 0–1)
Eosinophils Absolute: 0.2 10*3/uL (ref 0.0–0.7)
Eosinophils Relative: 3 % (ref 0–5)
Lymphocytes Relative: 24 % (ref 12–46)
Lymphs Abs: 1.5 10*3/uL (ref 0.7–4.0)
Monocytes Absolute: 0.4 10*3/uL (ref 0.1–1.0)
Monocytes Relative: 6 % (ref 3–12)
Neutro Abs: 4.3 10*3/uL (ref 1.7–7.7)
Neutrophils Relative %: 67 % (ref 43–77)

## 2010-10-22 LAB — CK TOTAL AND CKMB (NOT AT ARMC)
CK, MB: 4.5 ng/mL — ABNORMAL HIGH (ref 0.3–4.0)
Relative Index: 4.5 — ABNORMAL HIGH (ref 0.0–2.5)
Total CK: 101 U/L (ref 7–177)

## 2010-10-26 LAB — CBC
HCT: 28.3 % — ABNORMAL LOW (ref 36.0–46.0)
HCT: 33 % — ABNORMAL LOW (ref 36.0–46.0)
Hemoglobin: 10 g/dL — ABNORMAL LOW (ref 12.0–15.0)
Hemoglobin: 11.6 g/dL — ABNORMAL LOW (ref 12.0–15.0)
MCHC: 35.2 g/dL (ref 30.0–36.0)
MCHC: 35.4 g/dL (ref 30.0–36.0)
MCV: 94.7 fL (ref 78.0–100.0)
MCV: 95.3 fL (ref 78.0–100.0)
Platelets: 169 10*3/uL (ref 150–400)
Platelets: 207 10*3/uL (ref 150–400)
RBC: 2.99 MIL/uL — ABNORMAL LOW (ref 3.87–5.11)
RBC: 3.46 MIL/uL — ABNORMAL LOW (ref 3.87–5.11)
RDW: 11.6 % (ref 11.5–15.5)
RDW: 12 % (ref 11.5–15.5)
WBC: 4.7 10*3/uL (ref 4.0–10.5)
WBC: 5.6 10*3/uL (ref 4.0–10.5)

## 2010-10-26 LAB — COMPREHENSIVE METABOLIC PANEL
ALT: 43 U/L — ABNORMAL HIGH (ref 0–35)
AST: 47 U/L — ABNORMAL HIGH (ref 0–37)
Albumin: 3.6 g/dL (ref 3.5–5.2)
Alkaline Phosphatase: 88 U/L (ref 39–117)
BUN: 12 mg/dL (ref 6–23)
CO2: 26 mEq/L (ref 19–32)
Calcium: 8.8 mg/dL (ref 8.4–10.5)
Chloride: 103 mEq/L (ref 96–112)
Creatinine, Ser: 0.82 mg/dL (ref 0.4–1.2)
GFR calc Af Amer: >60 mL/min (ref >60)
GFR calc non Af Amer: >60 mL/min (ref >60)
Glucose, Bld: 135 mg/dL — ABNORMAL HIGH (ref 70–99)
Potassium: 4 mEq/L (ref 3.5–5.1)
Sodium: 136 mEq/L (ref 135–145)
Total Bilirubin: 0.4 mg/dL (ref 0.3–1.2)
Total Protein: 6.6 g/dL (ref 6.0–8.3)

## 2010-10-26 LAB — GLUCOSE, CAPILLARY
Glucose-Capillary: 118 mg/dL — ABNORMAL HIGH (ref 70–99)
Glucose-Capillary: 119 mg/dL — ABNORMAL HIGH (ref 70–99)
Glucose-Capillary: 124 mg/dL — ABNORMAL HIGH (ref 70–99)
Glucose-Capillary: 125 mg/dL — ABNORMAL HIGH (ref 70–99)
Glucose-Capillary: 127 mg/dL — ABNORMAL HIGH (ref 70–99)
Glucose-Capillary: 177 mg/dL — ABNORMAL HIGH (ref 70–99)
Glucose-Capillary: 83 mg/dL (ref 70–99)

## 2010-10-26 LAB — LIPID PANEL
Cholesterol: 179 mg/dL (ref 0–200)
HDL: 35 mg/dL — ABNORMAL LOW (ref >39)
LDL Cholesterol: UNDETERMINED mg/dL (ref 0–99)
Total CHOL/HDL Ratio: 5.1 RATIO
Triglycerides: 726 mg/dL — ABNORMAL HIGH (ref <150)
VLDL: UNDETERMINED mg/dL (ref 0–40)

## 2010-10-26 LAB — BASIC METABOLIC PANEL
BUN: 11 mg/dL (ref 6–23)
CO2: 25 mEq/L (ref 19–32)
Calcium: 8.2 mg/dL — ABNORMAL LOW (ref 8.4–10.5)
Chloride: 106 mEq/L (ref 96–112)
Creatinine, Ser: 0.71 mg/dL (ref 0.4–1.2)
GFR calc Af Amer: >60 mL/min (ref >60)
GFR calc non Af Amer: >60 mL/min (ref >60)
Glucose, Bld: 153 mg/dL — ABNORMAL HIGH (ref 70–99)
Potassium: 3.2 mEq/L — ABNORMAL LOW (ref 3.5–5.1)
Sodium: 137 mEq/L (ref 135–145)

## 2010-10-26 LAB — CARDIAC PANEL(CRET KIN+CKTOT+MB+TROPI)
CK, MB: 11.6 ng/mL (ref 0.3–4.0)
CK, MB: 15.2 ng/mL (ref 0.3–4.0)
Relative Index: 7 — ABNORMAL HIGH (ref 0.0–2.5)
Relative Index: 8.1 — ABNORMAL HIGH (ref 0.0–2.5)
Total CK: 165 U/L (ref 7–177)
Total CK: 188 U/L — ABNORMAL HIGH (ref 7–177)
Troponin I: 1.72 ng/mL (ref 0.00–0.06)
Troponin I: 2.26 ng/mL (ref 0.00–0.06)

## 2010-10-26 LAB — MRSA PCR SCREENING: MRSA by PCR: NEGATIVE

## 2010-10-26 LAB — HEMOGLOBIN A1C
Hgb A1c MFr Bld: 6.1 % (ref 4.6–6.1)
Mean Plasma Glucose: 128 mg/dL

## 2010-10-26 LAB — PROTIME-INR
INR: 0.95 (ref 0.00–1.49)
Prothrombin Time: 12.6 seconds (ref 11.6–15.2)

## 2010-10-26 LAB — TSH: TSH: 0.414 u[IU]/mL (ref 0.350–4.500)

## 2010-10-26 LAB — HEPARIN LEVEL (UNFRACTIONATED)
Heparin Unfractionated: 0.17 IU/mL — ABNORMAL LOW (ref 0.30–0.70)
Heparin Unfractionated: <0.1 IU/mL — ABNORMAL LOW (ref 0.30–0.70)

## 2010-10-26 LAB — PLATELET FUNCTION ASSAY
Collagen / ADP: >300 seconds (ref 0–114)
Collagen / Epinephrine: 280 seconds (ref 0–184)

## 2010-12-01 ENCOUNTER — Other Ambulatory Visit: Payer: Self-pay | Admitting: Cardiovascular Disease

## 2010-12-02 ENCOUNTER — Encounter: Payer: Self-pay | Admitting: Cardiovascular Disease

## 2010-12-07 ENCOUNTER — Ambulatory Visit (INDEPENDENT_AMBULATORY_CARE_PROVIDER_SITE_OTHER): Payer: 59 | Admitting: Cardiovascular Disease

## 2010-12-07 ENCOUNTER — Encounter: Payer: Self-pay | Admitting: Cardiovascular Disease

## 2010-12-07 DIAGNOSIS — I2510 Atherosclerotic heart disease of native coronary artery without angina pectoris: Secondary | ICD-10-CM

## 2010-12-07 DIAGNOSIS — E782 Mixed hyperlipidemia: Secondary | ICD-10-CM

## 2010-12-07 DIAGNOSIS — I10 Essential (primary) hypertension: Secondary | ICD-10-CM

## 2010-12-07 NOTE — Progress Notes (Signed)
Daisy Bennett is seen today post stenting 2/11  She had a non ST elevation MI with subsequent stenting of an OM.  She has diffuse 3VD with a total right that is collateralized.  She had smoewhat atypical presentation with her DM. [name]  She has a benign systolic murmur and does not need SBE prophylaxis. She is walking two miles/day and is motivated so I think this is ok.  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 Myovue 5/11  reviewed and showed no significant ischemia.  .  She had labs at Dr Elige Ko office  12/11 and they were reviewed.  HbA1c 6.0.  LDL 57 LFT's ok and TSH mildly elevated now on low dose synthroid. She denies SSCP, edema, palpitations. Her BP has been running high and needs further Rx Last visit she had her coreg increased and norvasc added.  BP improved.  Some easy bruising   ROS: Denies fever, malais, weight loss, blurry vision, decreased visual acuity, cough, sputum, SOB, hemoptysis, pleuritic pain, palpitaitons, heartburn, abdominal pain, melena, lower extremity edema, claudication, or rash.   General: Affect appropriate Healthy:  appears stated age HEENT: normal Neck supple with no adenopathy JVP normal no bruits no thyromegaly Lungs clear with no wheezing and good diaphragmatic motion Heart:  S1/S2 SEM murmur,rub, gallop or click PMI normal Abdomen: benighn, BS positve, no tenderness, no AAA no bruit.  No HSM or HJR Distal pulses intact with no bruits No edema Neuro non-focal Skin warm and dry No muscular weakness   Current Outpatient Prescriptions  Medication Sig Dispense Refill  . amitriptyline (ELAVIL) 50 MG tablet Take 50 mg by mouth daily.        [name] amLODipine (NORVASC) 10 MG tablet Take 10 mg by mouth daily.        [name] aspirin EC 325 MG EC tablet Take 325 mg by mouth daily.        . carboxymethylcellulose (REFRESH PLUS) 0.5 % SOLN as directed.        . carvedilol (COREG) 6.25 MG tablet Take 1 tablet (6.25 mg total) by mouth 2 (two) times daily.  180  tablet  3  . CINNAMON PO Take by mouth. 2000 mg caps. daily       . cycloSPORINE (RESTASIS) 0.05 % ophthalmic emulsion as directed.        . fish oil-omega-3 fatty acids 1000 MG capsule Take 1 g by mouth daily.        [name] gabapentin (NEURONTIN) 600 MG tablet Take 600 mg by mouth 3 (three) times daily.        . interferon beta-1b (BETASERON) 0.3 MG injection Inject 0.25 mg into the skin every other day.        . levothyroxine (SYNTHROID, LEVOTHROID) 100 MCG tablet Take 100 mcg by mouth daily.        [name] losartan-hydrochlorothiazide (HYZAAR) 100-25 MG per tablet Take 1 tablet by mouth daily.        [name] loteprednol (LOTEMAX) 0.5 % ophthalmic suspension as directed.        . metFORMIN (GLUCOPHAGE) 500 MG tablet Take 500 mg by mouth 2 (two) times daily with a meal.        . Multiple Vitamin (MULTIVITAMINS PO) Take by mouth. daily       . nitroGLYCERIN (NITROSTAT) 0.4 MG SL tablet Place 0.4 mg under the tongue every 5 (five) minutes as needed.        [name] omeprazole (PRILOSEC OTC) 20 MG tablet  Take 20 mg by mouth daily.        . prasugrel (EFFIENT) 10 MG TABS Take by mouth. daily       . rosuvastatin (CRESTOR) 10 MG tablet Take 10 mg by mouth daily.        [name] venlafaxine (EFFEXOR-XR) 75 MG 24 hr capsule Take 75 mg by mouth daily.          Allergies  Review of patient's allergies indicates not on file.  Electrocardiogram:  Assessment and Plan

## 2010-12-07 NOTE — Assessment & Plan Note (Signed)
Well controlled.  Continue current medications and low sodium Dash type diet.    

## 2010-12-07 NOTE — Assessment & Plan Note (Signed)
Cholesterol is at goal.  Continue current dose of statin and diet Rx.  No myalgias or side effects.  F/U  LFT's in 6 months. Lab Results  Component Value Date   LDLCALC  Value: UNABLE TO CALCULATE IF TRIGLYCERIDE OVER 400 mg/dL        Total Cholesterol/HDL:CHD Risk Coronary Heart Disease Risk Table                     Men   Women  1/2 Average Risk   3.4   3.3  Average Risk       5.0   4.4  2 X Average Risk   9.6   7.1  3 X Average Risk  23.4   11.0        Use the calculated Patient Ratio above and the CHD Risk Table to determine the patient's CHD Risk.        ATP III CLASSIFICATION (LDL):  <100     mg/dL   Optimal  100-129  mg/dL   Near or Above                    Optimal  130-159  mg/dL   Borderline  160-189  mg/dL   High  >190     mg/dL   Very High 09/30/2009             

## 2010-12-07 NOTE — Assessment & Plan Note (Signed)
Stable no angina.  Myovue nonischemic 5/11

## 2011-01-15 ENCOUNTER — Ambulatory Visit: Payer: Self-pay | Admitting: Unknown Physician Specialty

## 2011-05-08 ENCOUNTER — Other Ambulatory Visit: Payer: Self-pay | Admitting: Cardiovascular Disease

## 2011-06-06 ENCOUNTER — Other Ambulatory Visit: Payer: Self-pay | Admitting: Cardiovascular Disease

## 2011-07-15 ENCOUNTER — Ambulatory Visit (INDEPENDENT_AMBULATORY_CARE_PROVIDER_SITE_OTHER): Payer: 59 | Admitting: Cardiovascular Disease

## 2011-07-15 ENCOUNTER — Encounter: Payer: Self-pay | Admitting: Cardiovascular Disease

## 2011-07-15 ENCOUNTER — Telehealth: Payer: Self-pay | Admitting: Cardiovascular Disease

## 2011-07-15 VITALS — BP 140/82 | HR 75 | Ht 66.0 in | Wt 187.0 lb

## 2011-07-15 DIAGNOSIS — I2510 Atherosclerotic heart disease of native coronary artery without angina pectoris: Secondary | ICD-10-CM

## 2011-07-15 MED ORDER — NITROGLYCERIN 0.4 MG SL SUBL: 0.4000 mg | SUBLINGUAL_TABLET | SUBLINGUAL | Status: DC | PRN

## 2011-07-15 NOTE — Assessment & Plan Note (Signed)
Stable with no angina and good activity level.  Continue medical Rx  

## 2011-07-15 NOTE — Assessment & Plan Note (Signed)
Discussed low carb diet.  Target hemoglobin A1c is 6.5 or less.  Continue current medications.  

## 2011-07-15 NOTE — Telephone Encounter (Signed)
Close  

## 2011-07-15 NOTE — Assessment & Plan Note (Signed)
Well controlled.  Continue current medications and low sodium Dash type diet.    

## 2011-07-15 NOTE — Assessment & Plan Note (Signed)
Cholesterol is at goal.  Continue current dose of statin and diet Rx.  No myalgias or side effects.  F/U  LFT's in 6 months. Lab Results  Component Value Date   LDLCALC  Value: UNABLE TO CALCULATE IF TRIGLYCERIDE OVER 400 mg/dL        Total Cholesterol/HDL:CHD Risk Coronary Heart Disease Risk Table                     Men   Women  1/2 Average Risk   3.4   3.3  Average Risk       5.0   4.4  2 X Average Risk   9.6   7.1  3 X Average Risk  23.4   11.0        Use the calculated Patient Ratio above and the CHD Risk Table to determine the patient's CHD Risk.        ATP III CLASSIFICATION (LDL):  <100     mg/dL   Optimal  100-129  mg/dL   Near or Above                    Optimal  130-159  mg/dL   Borderline  160-189  mg/dL   High  >190     mg/dL   Very High 09/30/2009             

## 2011-07-15 NOTE — Patient Instructions (Signed)
Your physician wants you to follow-up in:  6 months. You will receive a reminder letter in the mail two months in advance. If you don't receive a letter, please call our office to schedule the follow-up appointment.   

## 2011-07-15 NOTE — Progress Notes (Signed)
Daisy Bennett is seen today post stenting 2/11 She had a non ST elevation MI with subsequent stenting of an OM. She has diffuse 3VD with a total right that is collateralized. She had smoewhat atypical presentation with her DM. [name] She has a benign systolic murmur and does not need SBE prophylaxis. She is walking two miles/day and is motivated so I think this is ok. 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 Myovue 5/11 reviewed and showed no significant ischemia. . She had labs at Dr Elige Ko office 12/11 and they were reviewed. HbA1c 6.0. LDL 57 LFT's ok and TSH mildly elevated now on low dose synthroid. She denies edema, palpitations. Her BP has been running high and needs further Rx Last visit she had her coreg increased and norvasc added. BP improved. Some easy bruising   Some atypical sharp fleeting chest pain that she has not taken nitro for.  Needs refill on nitro  ROS: Denies fever, malais, weight loss, blurry vision, decreased visual acuity, cough, sputum, SOB, hemoptysis, pleuritic pain, palpitaitons, heartburn, abdominal pain, melena, lower extremity edema, claudication, or rash.  All other systems reviewed and negative  General: Affect appropriate Healthy:  appears stated age HEENT: normal Neck supple with no adenopathy JVP normal no bruits no thyromegaly Lungs clear with no wheezing and good diaphragmatic motion Heart:  S1/S2 no murmur,rub, gallop or click PMI normal Abdomen: benighn, BS positve, no tenderness, no AAA no bruit.  No HSM or HJR Distal pulses intact with no bruits No edema Neuro non-focal Skin warm and dry No muscular weakness   Current Outpatient Prescriptions  Medication Sig Dispense Refill  . amitriptyline (ELAVIL) 50 MG tablet Take 50 mg by mouth daily.        [name] amLODipine (NORVASC) 10 MG tablet Take 10 mg by mouth daily.        [name] aspirin EC 325 MG EC tablet Take 325 mg by mouth daily.        . carboxymethylcellulose (REFRESH PLUS) 0.5 % SOLN as  directed.        . carvedilol (COREG) 6.25 MG tablet Take 1 tablet (6.25 mg total) by mouth 2 (two) times daily.  180 tablet  3  . CINNAMON PO Take by mouth. 2000 mg caps. daily       . CRESTOR 10 MG tablet TAKE 1 TABLET DAILY  90 tablet  1  . cycloSPORINE (RESTASIS) 0.05 % ophthalmic emulsion as directed.        [name] EFFIENT 10 MG TABS TAKE 1 TABLET DAILY  30 tablet  12  . Ferrous Sulfate (IRON) 325 (65 FE) MG TABS Take 1 tablet by mouth daily.        . fish oil-omega-3 fatty acids 1000 MG capsule Take 1 g by mouth daily.        [name] gabapentin (NEURONTIN) 600 MG tablet Take 600 mg by mouth 3 (three) times daily.        . interferon beta-1b (BETASERON) 0.3 MG injection Inject 0.25 mg into the skin every other day.        . levothyroxine (SYNTHROID, LEVOTHROID) 100 MCG tablet Take 100 mcg by mouth daily.        [name] losartan-hydrochlorothiazide (HYZAAR) 100-25 MG per tablet Take 1 tablet by mouth daily.        [name] loteprednol (LOTEMAX) 0.5 % ophthalmic suspension as directed.        . metFORMIN (GLUCOPHAGE) 500 MG tablet Take 500 mg by  mouth 2 (two) times daily with a meal.        . Multiple Vitamin (MULTIVITAMINS PO) Take by mouth. daily       . nitroGLYCERIN (NITROSTAT) 0.4 MG SL tablet Place 0.4 mg under the tongue every 5 (five) minutes as needed.        [name] omeprazole (PRILOSEC OTC) 20 MG tablet Take 20 mg by mouth daily.        [name] venlafaxine (EFFEXOR-XR) 75 MG 24 hr capsule Take 75 mg by mouth daily.          Allergies  Review of patient's allergies indicates no known allergies.  Electrocardiogram:  NSR 69  Normal ECG  Assessment and Plan

## 2011-08-05 ENCOUNTER — Other Ambulatory Visit: Payer: Self-pay | Admitting: Cardiovascular Disease

## 2011-08-12 ENCOUNTER — Other Ambulatory Visit: Payer: Self-pay | Admitting: Cardiovascular Disease

## 2011-08-12 MED ORDER — CARVEDILOL 6.25 MG PO TABS: 6.2500 mg | ORAL_TABLET | Freq: Two times a day (BID) | ORAL | Status: DC

## 2011-08-12 MED ORDER — AMLODIPINE BESYLATE 10 MG PO TABS: 10.0000 mg | ORAL_TABLET | Freq: Every day | ORAL | Status: DC

## 2011-08-12 MED ORDER — PRASUGREL HCL 10 MG PO TABS: 10.0000 mg | ORAL_TABLET | Freq: Every day | ORAL | Status: DC

## 2011-08-12 MED ORDER — ROSUVASTATIN CALCIUM 10 MG PO TABS: 10.0000 mg | ORAL_TABLET | Freq: Every day | ORAL | Status: DC

## 2011-08-12 MED ORDER — LOSARTAN POTASSIUM-HCTZ 100-25 MG PO TABS: 1.0000 | ORAL_TABLET | Freq: Every day | ORAL | Status: DC

## 2011-08-18 ENCOUNTER — Ambulatory Visit: Payer: Self-pay | Admitting: Unknown Physician Specialty

## 2012-01-27 ENCOUNTER — Other Ambulatory Visit: Payer: Self-pay | Admitting: *Deleted

## 2012-01-27 MED ORDER — PRASUGREL HCL 10 MG PO TABS: 10.0000 mg | ORAL_TABLET | Freq: Every day | ORAL | Status: DC

## 2012-03-06 ENCOUNTER — Ambulatory Visit: Payer: 59 | Admitting: Cardiovascular Disease

## 2012-03-21 ENCOUNTER — Ambulatory Visit (INDEPENDENT_AMBULATORY_CARE_PROVIDER_SITE_OTHER): Payer: 59 | Admitting: Cardiovascular Disease

## 2012-03-21 ENCOUNTER — Encounter: Payer: Self-pay | Admitting: Cardiovascular Disease

## 2012-03-21 VITALS — BP 135/77 | HR 67 | Ht 66.0 in | Wt 193.0 lb

## 2012-03-21 DIAGNOSIS — I2510 Atherosclerotic heart disease of native coronary artery without angina pectoris: Secondary | ICD-10-CM

## 2012-03-21 DIAGNOSIS — E782 Mixed hyperlipidemia: Secondary | ICD-10-CM

## 2012-03-21 DIAGNOSIS — E119 Type 2 diabetes mellitus without complications: Secondary | ICD-10-CM

## 2012-03-21 DIAGNOSIS — I10 Essential (primary) hypertension: Secondary | ICD-10-CM

## 2012-03-21 DIAGNOSIS — R011 Cardiac murmur, unspecified: Secondary | ICD-10-CM

## 2012-03-21 MED ORDER — METFORMIN HCL 1000 MG PO TABS: 1000.0000 mg | ORAL_TABLET | Freq: Two times a day (BID) | ORAL | Status: DC

## 2012-03-21 NOTE — Assessment & Plan Note (Signed)
Cholesterol is at goal.  Continue current dose of statin and diet Rx.  No myalgias or side effects.  F/U  LFT's in 6 months. Lab Results  Component Value Date   LDLCALC  Value: UNABLE TO CALCULATE IF TRIGLYCERIDE OVER 400 mg/dL        Total Cholesterol/HDL:CHD Risk Coronary Heart Disease Risk Table                     Men   Women  1/2 Average Risk   3.4   3.3  Average Risk       5.0   4.4  2 X Average Risk   9.6   7.1  3 X Average Risk  23.4   11.0        Use the calculated Patient Ratio above and the CHD Risk Table to determine the patient's CHD Risk.        ATP III CLASSIFICATION (LDL):  <100     mg/dL   Optimal  100-129  mg/dL   Near or Above                    Optimal  130-159  mg/dL   Borderline  160-189  mg/dL   High  >190     mg/dL   Very High 09/30/2009             

## 2012-03-21 NOTE — Assessment & Plan Note (Signed)
AV sclerosis murmur unchanged on exam

## 2012-03-21 NOTE — Patient Instructions (Signed)
Your physician wants you to follow-up in:  6 MONTHS WITH DR NISHAN  You will receive a reminder letter in the mail two months in advance. If you don't receive a letter, please call our office to schedule the follow-up appointment. Your physician recommends that you continue on your current medications as directed. Please refer to the Current Medication list given to you today. 

## 2012-03-21 NOTE — Progress Notes (Signed)
Patient ID: Daisy Bennett, female   DOB: 1955/12/28, 56 y.o.   MRN: [id number] Daisy Bennett is seen today post stenting 2/11 She had a non ST elevation MI with subsequent stenting of an OM. She has diffuse 3VD with a total right that is collateralized. She had smoewhat atypical presentation with her DM. [name] She has a benign systolic murmur and does not need SBE prophylaxis. She is walking two miles/day and is motivated so I think this is ok. 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 Myovue 5/11 reviewed and showed no significant ischemia. . She had labs at Dr Elige Ko office recently  and they were reviewed. HbA1c 7.2 . LDL 57 LFT's ok and TSH mildly elevated now on low dose synthroid. She denies SSCP, edema, palpitations. Her BP has been running high and needs further Rx Last visit she had her coreg increased and norvasc added. BP improved. Some easy bruising   Right foot surgery healing well.     ROS: Denies fever, malais, weight loss, blurry vision, decreased visual acuity, cough, sputum, SOB, hemoptysis, pleuritic pain, palpitaitons, heartburn, abdominal pain, melena, lower extremity edema, claudication, or rash.  All other systems reviewed and negative  General: Affect appropriate Healthy:  appears stated age HEENT: normal Neck supple with no adenopathy JVP normal no bruits no thyromegaly Lungs clear with no wheezing and good diaphragmatic motion Heart:  S1/S2 SEM murmur, no rub, gallop or click PMI normal Abdomen: benighn, BS positve, no tenderness, no AAA no bruit.  No HSM or HJR Distal pulses intact with no bruits No edema Neuro non-focal Skin warm and dry No muscular weakness   Current Outpatient Prescriptions  Medication Sig Dispense Refill  . amitriptyline (ELAVIL) 50 MG tablet Take 50 mg by mouth daily.        [name] amLODipine (NORVASC) 10 MG tablet Take 1 tablet (10 mg total) by mouth daily.  90 tablet  2  . aspirin EC 325 MG EC tablet Take 325 mg by mouth daily.         . carvedilol (COREG) 6.25 MG tablet Take 1 tablet (6.25 mg total) by mouth 2 (two) times daily.  180 tablet  3  . CINNAMON PO Take by mouth. 2000 mg caps. daily       . Ferrous Sulfate (IRON) 325 (65 FE) MG TABS Take 1 tablet by mouth daily.        . fish oil-omega-3 fatty acids 1000 MG capsule Take 1 g by mouth daily.        [name] gabapentin (NEURONTIN) 600 MG tablet Take 600 mg by mouth 3 (three) times daily.        [name] levothyroxine (SYNTHROID, LEVOTHROID) 100 MCG tablet Take 100 mcg by mouth daily.        [name] losartan-hydrochlorothiazide (HYZAAR) 100-25 MG per tablet Take 1 tablet by mouth daily.  90 tablet  2  . metFORMIN (GLUCOPHAGE) 500 MG tablet Take 500 mg by mouth 2 (two) times daily with a meal.        . nitroGLYCERIN (NITROSTAT) 0.4 MG SL tablet Place 1 tablet (0.4 mg total) under the tongue every 5 (five) minutes as needed for chest pain.  100 tablet  3  . omeprazole (PRILOSEC OTC) 20 MG tablet Take 20 mg by mouth daily.        . prasugrel (EFFIENT) 10 MG TABS Take 1 tablet (10 mg total) by mouth daily.  90 tablet  2  . rosuvastatin (  CRESTOR) 10 MG tablet Take 1 tablet (10 mg total) by mouth daily.  90 tablet  1  . venlafaxine (EFFEXOR-XR) 75 MG 24 hr capsule Take 75 mg by mouth daily.        . Multiple Vitamin (MULTIVITAMINS PO) Take by mouth. daily       . DISCONTD: nitroGLYCERIN (NITROSTAT) 0.4 MG SL tablet Place 0.4 mg under the tongue every 5 (five) minutes as needed.          Allergies  Review of patient's allergies indicates no known allergies.  Electrocardiogram:  NSR rate 63 normal ECCG  Assessment and Plan

## 2012-03-21 NOTE — Assessment & Plan Note (Signed)
Stable with no angina and good activity level.  Continue medical Rx  

## 2012-03-21 NOTE — Addendum Note (Signed)
Addended by: Scherrie Bateman E on: 03/21/2012 04:58 PM   Modules accepted: Orders

## 2012-03-21 NOTE — Assessment & Plan Note (Signed)
Discussed low carb diet.  Target hemoglobin A1c is 6.5 or less.  Continue current medications. glucophage increased to 1gr bid

## 2012-03-21 NOTE — Assessment & Plan Note (Signed)
Well controlled.  Continue current medications and low sodium Dash type diet.    

## 2012-05-04 ENCOUNTER — Other Ambulatory Visit: Payer: Self-pay | Admitting: *Deleted

## 2012-05-04 MED ORDER — LOSARTAN POTASSIUM-HCTZ 100-25 MG PO TABS: 1.0000 | ORAL_TABLET | Freq: Every day | ORAL | Status: DC

## 2012-05-09 ENCOUNTER — Other Ambulatory Visit: Payer: Self-pay | Admitting: *Deleted

## 2012-05-09 MED ORDER — LOSARTAN POTASSIUM-HCTZ 100-25 MG PO TABS: 1.0000 | ORAL_TABLET | Freq: Every day | ORAL | Status: DC

## 2012-06-27 ENCOUNTER — Other Ambulatory Visit: Payer: Self-pay | Admitting: *Deleted

## 2012-06-27 MED ORDER — AMLODIPINE BESYLATE 10 MG PO TABS: 10.0000 mg | ORAL_TABLET | Freq: Every day | ORAL | Status: DC

## 2012-06-27 MED ORDER — ROSUVASTATIN CALCIUM 10 MG PO TABS: 10.0000 mg | ORAL_TABLET | Freq: Every day | ORAL | Status: DC

## 2012-07-11 ENCOUNTER — Telehealth: Payer: Self-pay | Admitting: Cardiovascular Disease

## 2012-07-11 NOTE — Telephone Encounter (Signed)
New Problem: ° ° ° °Patient called in needing a refill of her rosuvastatin (CRESTOR) 10 MG tablet. °

## 2012-07-12 MED ORDER — ROSUVASTATIN CALCIUM 10 MG PO TABS: 10.0000 mg | ORAL_TABLET | Freq: Every day | ORAL | Status: DC

## 2012-09-20 ENCOUNTER — Telehealth: Payer: Self-pay | Admitting: Cardiovascular Disease

## 2012-09-20 NOTE — Telephone Encounter (Signed)
New problem   C/o chest pain. Pain level is  3-4. On/off for couples of days.  Her nitro tables has expire.

## 2012-09-22 ENCOUNTER — Ambulatory Visit (INDEPENDENT_AMBULATORY_CARE_PROVIDER_SITE_OTHER): Payer: 59 | Admitting: Cardiovascular Disease

## 2012-09-22 ENCOUNTER — Encounter: Payer: Self-pay | Admitting: Cardiovascular Disease

## 2012-09-22 ENCOUNTER — Other Ambulatory Visit: Payer: Self-pay | Admitting: *Deleted

## 2012-09-22 VITALS — BP 144/88 | HR 81 | Ht 66.0 in | Wt 199.1 lb

## 2012-09-22 DIAGNOSIS — I2510 Atherosclerotic heart disease of native coronary artery without angina pectoris: Secondary | ICD-10-CM

## 2012-09-22 DIAGNOSIS — E782 Mixed hyperlipidemia: Secondary | ICD-10-CM

## 2012-09-22 DIAGNOSIS — I10 Essential (primary) hypertension: Secondary | ICD-10-CM

## 2012-09-22 DIAGNOSIS — R079 Chest pain, unspecified: Secondary | ICD-10-CM

## 2012-09-22 DIAGNOSIS — G35 Multiple sclerosis: Secondary | ICD-10-CM

## 2012-09-22 MED ORDER — NITROGLYCERIN 0.4 MG SL SUBL: 0.4000 mg | SUBLINGUAL_TABLET | SUBLINGUAL | Status: DC | PRN

## 2012-09-22 NOTE — Telephone Encounter (Signed)
PT HERE FOR APPT WITH DR Eden Emms TODAY  2.21.14 STRESS MYOVIEW ORDERED AND FRESH NTG SENT TO PHARMACY VIA EPIC .Zack Seal

## 2012-09-22 NOTE — Assessment & Plan Note (Signed)
F/U stress myovue Symptoms have never been typical  Myovue may show inferior ischemia from collateralized RCA but will r/o high risk features that would warrant cath

## 2012-09-22 NOTE — Assessment & Plan Note (Signed)
Well controlled.  Continue current medications and low sodium Dash type diet.    

## 2012-09-22 NOTE — Assessment & Plan Note (Signed)
Cholesterol is at goal.  Continue current dose of statin and diet Rx.  No myalgias or side effects.  F/U  LFT's in 6 months. Lab Results  Component Value Date   LDLCALC  Value: UNABLE TO CALCULATE IF TRIGLYCERIDE OVER 400 mg/dL        Total Cholesterol/HDL:CHD Risk Coronary Heart Disease Risk Table                     Men   Women  1/2 Average Risk   3.4   3.3  Average Risk       5.0   4.4  2 X Average Risk   9.6   7.1  3 X Average Risk  23.4   11.0        Use the calculated Patient Ratio above and the CHD Risk Table to determine the patient's CHD Risk.        ATP III CLASSIFICATION (LDL):  <100     mg/dL   Optimal  696-295  mg/dL   Near or Above                    Optimal  130-159  mg/dL   Borderline  284-132  mg/dL   High  >440     mg/dL   Very High 1/0/2725

## 2012-09-22 NOTE — Patient Instructions (Signed)
Your physician recommends that you schedule a follow-up appointment in:   3 MONTHS   WITH  DR NISHAN Your physician recommends that you continue on your current medications as directed. Please refer to the Current Medication list given to you today.  Your physician has requested that you have en exercise stress myoview. For further information please visit [email or web address]. Please follow instruction sheet, as given.  

## 2012-09-22 NOTE — Progress Notes (Signed)
Patient ID: Daisy Bennett, female   DOB: 11/06/1955, 57 y.o.   MRN: [id number] Daisy Bennett is seen today post stenting 2/11 She had a non ST elevation MI with subsequent stenting of an OM. She has diffuse 3VD with a total right that is collateralized. She had smoewhat atypical presentation with her DM. Daisy Bennett She has a benign systolic murmur and does not need SBE  Had SSCP Wendsday nitro not fresh.  Has been doing cross fit new exercises.  Pain started in shoulder blades but then to chest. Lasted less than an hour but recurred a couple times.  Working out last two days with no issues  . Labs reviewed 08/21/12 LDL 49  Normal LFT;s and TSH HbA1c 6.4  ROS: Denies fever, malais, weight loss, blurry vision, decreased visual acuity, cough, sputum, SOB, hemoptysis, pleuritic pain, palpitaitons, heartburn, abdominal pain, melena, lower extremity edema, claudication, or rash.  All other systems reviewed and negative  General: Affect appropriate Healthy:  appears stated age HEENT: normal Neck supple with no adenopathy JVP normal no bruits no thyromegaly Lungs clear with no wheezing and good diaphragmatic motion Heart:  S1/S2 SEM  murmur, no rub, gallop or click PMI normal Abdomen: benighn, BS positve, no tenderness, no AAA no bruit.  No HSM or HJR Distal pulses intact with no bruits No edema Neuro non-focal Skin warm and dry No muscular weakness   Current Outpatient Prescriptions  Medication Sig Dispense Refill  . amitriptyline (ELAVIL) 50 MG tablet Take 50 mg by mouth daily.        Daisy Bennett amLODipine (NORVASC) 10 MG tablet Take 1 tablet (10 mg total) by mouth daily.  90 tablet  2  . aspirin EC 325 MG EC tablet Take 325 mg by mouth daily.        . carvedilol (COREG) 6.25 MG tablet Take 6.25 mg by mouth 2 (two) times daily with a meal.      . CINNAMON PO Take by mouth. 2000 mg caps. daily       . Ferrous Sulfate (IRON) 325 (65 FE) MG TABS Take 1 tablet by mouth daily.        . fish oil-omega-3 fatty acids  1000 MG capsule Take 1 g by mouth daily.        Daisy Bennett gabapentin (NEURONTIN) 600 MG tablet Take 600 mg by mouth 2 (two) times daily.       Daisy Bennett levothyroxine (SYNTHROID, LEVOTHROID) 100 MCG tablet Take 100 mcg by mouth daily.        Daisy Bennett losartan-hydrochlorothiazide (HYZAAR) 100-25 MG per tablet Take 1 tablet by mouth daily.  90 tablet  2  . metFORMIN (GLUCOPHAGE) 1000 MG tablet Take 1 tablet (1,000 mg total) by mouth 2 (two) times daily with a meal.      . Multiple Vitamin (MULTIVITAMINS PO) Take by mouth. daily       . nitroGLYCERIN (NITROSTAT) 0.4 MG SL tablet Place 1 tablet (0.4 mg total) under the tongue every 5 (five) minutes as needed for chest pain.  25 tablet  3  . omeprazole (PRILOSEC OTC) 20 MG tablet Take 20 mg by mouth daily.        . pioglitazone (ACTOS) 30 MG tablet Take 30 mg by mouth daily.      . prasugrel (EFFIENT) 10 MG TABS Take 1 tablet (10 mg total) by mouth daily.  90 tablet  2  . rosuvastatin (CRESTOR) 10 MG tablet Take 1 tablet (10 mg total) by mouth daily.  90 tablet  3  . TECFIDERA 240 MG CPDR 240 mg 2 (two) times daily.       Daisy Bennett venlafaxine (EFFEXOR-XR) 75 MG 24 hr capsule Take 75 mg by mouth daily.         No current facility-administered medications for this visit.    Allergies  Review of patient's allergies indicates no known allergies.  Electrocardiogram:  Assessment and Plan

## 2012-09-22 NOTE — Assessment & Plan Note (Signed)
STable LFT;s down to normal with stopping beta serone

## 2012-09-27 ENCOUNTER — Ambulatory Visit (HOSPITAL_COMMUNITY): Payer: 59 | Attending: Cardiovascular Disease | Admitting: Radiology

## 2012-09-27 VITALS — BP 136/82 | Ht 66.0 in | Wt 200.0 lb

## 2012-09-27 DIAGNOSIS — R0602 Shortness of breath: Secondary | ICD-10-CM | POA: Insufficient documentation

## 2012-09-27 DIAGNOSIS — I10 Essential (primary) hypertension: Secondary | ICD-10-CM | POA: Insufficient documentation

## 2012-09-27 DIAGNOSIS — R42 Dizziness and giddiness: Secondary | ICD-10-CM | POA: Insufficient documentation

## 2012-09-27 DIAGNOSIS — R0789 Other chest pain: Secondary | ICD-10-CM | POA: Insufficient documentation

## 2012-09-27 DIAGNOSIS — E119 Type 2 diabetes mellitus without complications: Secondary | ICD-10-CM | POA: Insufficient documentation

## 2012-09-27 DIAGNOSIS — R000 Tachycardia, unspecified: Secondary | ICD-10-CM | POA: Insufficient documentation

## 2012-09-27 DIAGNOSIS — I252 Old myocardial infarction: Secondary | ICD-10-CM | POA: Insufficient documentation

## 2012-09-27 DIAGNOSIS — R079 Chest pain, unspecified: Secondary | ICD-10-CM

## 2012-09-27 DIAGNOSIS — R002 Palpitations: Secondary | ICD-10-CM | POA: Insufficient documentation

## 2012-09-27 DIAGNOSIS — E785 Hyperlipidemia, unspecified: Secondary | ICD-10-CM | POA: Insufficient documentation

## 2012-09-27 DIAGNOSIS — R9431 Abnormal electrocardiogram [ECG] [EKG]: Secondary | ICD-10-CM | POA: Insufficient documentation

## 2012-09-27 DIAGNOSIS — I2510 Atherosclerotic heart disease of native coronary artery without angina pectoris: Secondary | ICD-10-CM | POA: Insufficient documentation

## 2012-09-27 MED ORDER — TECHNETIUM TC 99M SESTAMIBI GENERIC - CARDIOLITE
11.0000 | Freq: Once | INTRAVENOUS | Status: AC | PRN
  Administered 2012-09-27: 11 via INTRAVENOUS

## 2012-09-27 MED ORDER — TECHNETIUM TC 99M SESTAMIBI GENERIC - CARDIOLITE
33.0000 | Freq: Once | INTRAVENOUS | Status: AC | PRN
  Administered 2012-09-27: 33 via INTRAVENOUS

## 2012-09-27 NOTE — Progress Notes (Signed)
[facility] SITE 3 NUCLEAR MED [address] [place], [place] [address] [phone]    Cardiology Nuclear Med Study  Daisy Bennett is a 57 y.o. female     MRN : [id number]     DOB: [birth year]  Procedure Date: 09/27/2012  Nuclear Med Background Indication for Stress Test:  Evaluation for Ischemia and Stent Patency History:  2/11 MI, Heart Catheterization-Ef 55%, Diffuse CAD, Stent-OM,  11/2009- MPS EF 74% Normal Cardiac Risk Factors: History of Smoking, Hypertension, Lipids and NIDDM  Symptoms:  Chest Pain, Dizziness, Palpitations, Rapid HR and SOB   Nuclear Pre-Procedure Caffeine/Decaff Intake:  None NPO After: 7:30pm   Lungs:  clear O2 Sat: 97% on room air. IV 0.9% NS with Angio Cath:  22g  IV Site: R Wrist  IV Started by:  Doyne Keel, CNMT  Chest Size (in):  38 Cup Size: C  Height: 5\' 6"  (1.676 m)  Weight:  200 lb (90.719 kg)  BMI:  Body mass index is 32.3 kg/(m^2). Tech Comments:  Held metformin and actos this am; pt took Visual merchandiser Med Study 1 or 2 day study: 1 day  Stress Test Type:  Stress  Reading MD: Cassell Clement, MD  Order Authorizing Provider:  P. Eden Emms, MD  Resting Radionuclide: Technetium 47m Sestamibi  Resting Radionuclide Dose: 11.0 mCi   Stress Radionuclide:  Technetium 8m Sestamibi  Stress Radionuclide Dose: 33.0 mCi           Stress Protocol Rest HR: 72 Stress HR: 141  Rest BP: 136/82 Stress BP: 175/78  Exercise Time (min): 9:44 METS: 10.1   Predicted Max HR: 162 bpm % Max HR: 87.04 bpm Rate Pressure Product: 21308   Dose of Adenosine (mg):  n/a Dose of Lexiscan: n/a mg  Dose of Atropine (mg): n/a Dose of Dobutamine: n/a mcg/kg/min (at max HR)  Stress Test Technologist: Bonnita Levan, RN  Nuclear Technologist:  Domenic Polite, CNMT     Rest Procedure:  Myocardial perfusion imaging was performed at rest 45 minutes following the intravenous administration of Technetium 15m Sestamibi. Rest ECG: NSR. No ischemic changes at  rest.  Stress Procedure:  The patient exercised on the treadmill utilizing the Bruce Protocol for 9:44 minutes. The patient stopped due to fatigue and dyspnea. She complained of CP (2/10) at peak exercise. The CP lasted into recovery and resolved after NTG SL was given x 1. Technetium 55m Sestamibi was injected at peak exercise and myocardial perfusion imaging was performed after a brief delay. Stress ECG: With exercise develops downsloping ST segments in inferolatera leads.  QPS Raw Data Images:  Normal; no motion artifact; normal heart/lung ratio. Stress Images:  Normal homogeneous uptake in all areas of the myocardium. Rest Images:  Normal homogeneous uptake in all areas of the myocardium. Subtraction (SDS):  No evidence of ischemia. Transient Ischemic Dilatation (Normal <1.22):  1.08 Lung/Heart Ratio (Normal <0.45):  0.26  Quantitative Gated Spect Images QGS EDV:  88 ml QGS ESV:  23 ml  Impression Exercise Capacity:  Fair exercise capacity. BP Response:  Normal blood pressure response. Clinical Symptoms:  Mild chest pain/dyspnea. ECG Impression:  Downsloping ST segments in inferolateral leads with exercise. Comparison with Prior Nuclear Study: No images to compare  Overall Impression:  Low risk stress nuclear study. No evidence of perfusion defect to suggest ischemia or scar.   Good LV systolic function. Abnormal EKG response to exercise.  LV Ejection Fraction: 74%.  LV Wall Motion:  NL LV Function;  NL Wall Motion   Signed by Ricky Ala on 09/27/2012 at 11:52 AM.  Cassell Clement

## 2012-09-28 ENCOUNTER — Encounter: Payer: Self-pay | Admitting: Cardiovascular Disease

## 2012-09-28 NOTE — Telephone Encounter (Signed)
Pt was called :overall impression of stress test given to pt. Pt is aware that Dr.Nisan will review test and make recommendations. We will let her know then. Pt will hold on doing normal exercises until hearing the official stress test results.

## 2012-10-02 ENCOUNTER — Telehealth: Payer: Self-pay | Admitting: *Deleted

## 2012-10-02 NOTE — Telephone Encounter (Signed)
Follow Up ° ° ° ° ° ° ° °Pt returning phone call °

## 2012-10-02 NOTE — Progress Notes (Unsigned)
Follow Up ° ° ° ° ° ° ° °Pt returning phone call °

## 2012-10-02 NOTE — Telephone Encounter (Signed)
PT AWARE OF MYOVIEW RESULTS./CY 

## 2012-11-06 ENCOUNTER — Encounter: Payer: Self-pay | Admitting: Cardiovascular Disease

## 2012-11-06 MED ORDER — CARVEDILOL 6.25 MG PO TABS: 6.2500 mg | ORAL_TABLET | Freq: Two times a day (BID) | ORAL | Status: DC

## 2012-11-08 ENCOUNTER — Encounter: Payer: Self-pay | Admitting: Cardiovascular Disease

## 2012-11-08 ENCOUNTER — Other Ambulatory Visit: Payer: Self-pay | Admitting: *Deleted

## 2012-11-08 MED ORDER — CARVEDILOL 6.25 MG PO TABS: 6.2500 mg | ORAL_TABLET | Freq: Two times a day (BID) | ORAL | Status: DC

## 2012-12-20 ENCOUNTER — Ambulatory Visit: Payer: 59 | Admitting: Cardiovascular Disease

## 2013-01-12 ENCOUNTER — Encounter: Payer: Self-pay | Admitting: Cardiovascular Disease

## 2013-01-12 ENCOUNTER — Other Ambulatory Visit: Payer: Self-pay | Admitting: *Deleted

## 2013-01-12 MED ORDER — LOSARTAN POTASSIUM-HCTZ 100-25 MG PO TABS: 1.0000 | ORAL_TABLET | Freq: Every day | ORAL | Status: DC

## 2013-02-01 ENCOUNTER — Encounter: Payer: Self-pay | Admitting: Cardiovascular Disease

## 2013-02-01 ENCOUNTER — Other Ambulatory Visit: Payer: Self-pay

## 2013-02-01 MED ORDER — PRASUGREL HCL 10 MG PO TABS: 10.0000 mg | ORAL_TABLET | Freq: Every day | ORAL | Status: DC

## 2013-02-01 NOTE — Telephone Encounter (Signed)
Effient 10 mg refill sent to pharmacy 90 day supply.

## 2013-02-15 ENCOUNTER — Ambulatory Visit: Payer: Self-pay | Admitting: Neurology

## 2013-02-19 ENCOUNTER — Ambulatory Visit (INDEPENDENT_AMBULATORY_CARE_PROVIDER_SITE_OTHER): Payer: 59 | Admitting: Neurology

## 2013-02-19 ENCOUNTER — Encounter: Payer: Self-pay | Admitting: Neurology

## 2013-02-19 VITALS — BP 146/79 | HR 79 | Ht 65.0 in | Wt 202.0 lb

## 2013-02-19 DIAGNOSIS — G35 Multiple sclerosis: Secondary | ICD-10-CM

## 2013-02-19 NOTE — Patient Instructions (Addendum)
Overall you are doing fairly well but I do want to suggest a few things today:   As far as your medications are concerned, we are not making any changes to your regimen  As far as diagnostic testing: we would like to check your blood work. You reported you will have this done with your PCP next month. Please send [place] a copy of these results.  I would like to see you back in 1 year, sooner if we need to. Please call us with any interim questions, concerns, problems, updates or refill requests.   Please also call us for any test results so we can go over those with you on the phone.  My clinical assistant and will answer any of your questions and relay your messages to me and also relay most of my messages to you.   Our phone number is [phone]. We also have an after hours call service for urgent matters and there is a physician on-call for urgent questions. For any emergencies you know to call 911 or go to the nearest emergency room  Please email me a copy of your blood work. My email is [email or web address]

## 2013-02-19 NOTE — Progress Notes (Signed)
HPI: Daisy Bennett is a 57y/o woman with hx of RRMS, currently well controlled on Tecfidera, presenting for routine follow up visit with last appointment being 07/2012.   Overall: Reports doing well. Denies any acute interval changes since her last visit. No new symptos/flare ups. Continues to take tecfidera bid. Tolerating it well, denies any GI upset or flushing. Reports brief episodes upon initiating the medication but this has since resolved. She has been on this medication since Oct 2013 after having taken Betaseron. She noted good response from Betaseron but discontinued due to skin irritation and ? Elevated LFTs. She reports she has not had any MRI imaging in >2 years. Last blood work in Dec 2013 and scheduled to have repeat blood work with PCP next month.  Reports that her back pain is stable and is not currently an issue. Does report intermittent episodes of muscle spams in LLE. Does note some tenderness to palpation in L post tibial region. Also notes increased swelling in bilat LE. Is scheduled to see cardiology in 1 month.     ROS: Constitutional: Denies fever, weight loss, weight gain Eyes: Denies blurry vision, loss of vision, eye pain CV: Denies chest pain, palpitations, syncope Pulm: Denies SOB, dyspnea, cough GI:  Denies constipation, diarrhea, abdominal pain MSK: Denies spasms, muscle pain, weakness Neuro:+ dizziness, + memory loss Denies HA, vertigo, falls, tremor Psyc:  Denies depression, hallucinatons, confusion Hem/lymph: Denies easy bleeding, bruising, no swollen nodes Allergic: No runny nose, hives, rashes  All other ROS are negative   Exam: Gen: NAD, conversant Eyes: anicteric sclerae, moist conjunctivae HENT: Atraumati Neck: Trachea midline; supple,  Lungs: CTA, no wheezing, rales, rhonic                          CV: RRR, no MRG Abdomen: Soft, non-tender;  Extremities: marked bilat LE pitting edema Skin: Normal temperature, no rash,  Psych:  Appropriate affect, pleasant   Neuro: Daisy: AA&Ox3, appropriately interactive, normal affect   Attention: WORLD backwards  Speech: fluent w/o paraphasic error  Memory: good recent and remote recall  CN: S/p cataract surgery L pupil, EOMI no nystagmus, no ptosis, sensation intact to LT V1-V3 bilat, face symmetric, no weakness, hearing decreased on L, palate elevates symmetrically, shoulder shrug 5/5 bilat,  tongue protrudes midline, no fasiculations noted.  Motor: normal bulk and tone Strength: 5/5  In all extremities  Coord: mild bilat extension tremor of hands, mild difficulty with RAM bilat hands  Reflexes: symmetrical, bilat downgoing toes  Sens: LT intact in all extremities  Gait: posture, stance, stride and arm-swing normal. Tandem gait difficult to do. Able to walk on heels and toes.     Assessment/Plan: 57y/o woman with hx of RRMS for >20years, currently well controlled on regimen of Tecfidera 240mg  bid. Returns to clinic today for follow up visit, reports she is doing well overall, no acute flare ups. Notes that her back pain and LLE pain are improved compared to prior visit.   1) RRMS  -appears clinically stable -will continue on Tecfidera  -patient scheduled to have CBC checked with PCP next month and email results to our office -will continue with q73month CBC checks for now -discussed likely need to repeat MRI imaging in the future, patient wishes to hold off at this point. It has been >69yrs since imaging (suggest every 1 to 2 years).   2)Chronic lumbar back pain:  -appears clinically stable -continue on Neurontin for symptomatic relief -in future if  symptoms worsen could consider increase neurontin, PT referral, steroid injection and/or botox injections for LE spasticity   A total of 30 minutes was spent in with this patient. Over half this time was spent on counseling patient on the diagnosis and different therapeutic options available. We discussed  progression and prognosis of Daisy. Questions about Tecfidera, including monitoring was discussed. The need for follow up with cardiology for further evaluation of LE edema was also discussed in detail.

## 2013-03-14 ENCOUNTER — Ambulatory Visit (INDEPENDENT_AMBULATORY_CARE_PROVIDER_SITE_OTHER): Payer: 59 | Admitting: Cardiovascular Disease

## 2013-03-14 VITALS — BP 140/80 | HR 64 | Ht 66.0 in | Wt 204.0 lb

## 2013-03-14 DIAGNOSIS — E782 Mixed hyperlipidemia: Secondary | ICD-10-CM

## 2013-03-14 DIAGNOSIS — I10 Essential (primary) hypertension: Secondary | ICD-10-CM

## 2013-03-14 DIAGNOSIS — E119 Type 2 diabetes mellitus without complications: Secondary | ICD-10-CM

## 2013-03-14 DIAGNOSIS — I2510 Atherosclerotic heart disease of native coronary artery without angina pectoris: Secondary | ICD-10-CM

## 2013-03-14 DIAGNOSIS — R609 Edema, unspecified: Secondary | ICD-10-CM

## 2013-03-14 MED ORDER — NITROGLYCERIN 0.4 MG SL SUBL: 0.4000 mg | SUBLINGUAL_TABLET | SUBLINGUAL | Status: DC | PRN

## 2013-03-14 MED ORDER — HYDROCHLOROTHIAZIDE 12.5 MG PO CAPS: 12.5000 mg | ORAL_CAPSULE | Freq: Every day | ORAL | Status: DC | PRN

## 2013-03-14 NOTE — Patient Instructions (Addendum)
Your physician wants you to follow-up in:   6 MONTHS WITH DR Haywood Filler will receive a reminder letter in the mail two months in advance. If you don't receive a letter, please call our office to schedule the follow-up appointment. Your physician has recommended you make the following change in your medication: HCTZ 12.5 MG  1 DAILY  AS NEEDED FOR  SWELLING Your physician has requested that you have a lower or upper extremity venous duplex. This test is an ultrasound of the veins in the legs or arms. It looks at venous blood flow that carries blood from the heart to the legs or arms. Allow one hour for a Lower Venous exam. Allow thirty minutes for an Upper Venous exam. There are no restrictions or special instructions.

## 2013-03-14 NOTE — Progress Notes (Signed)
Patient ID: Daisy Bennett, female   DOB: 01/21/1956, 57 y.o.   MRN: [id number] Daisy Bennett is seen today post stenting 2/11 She had a non ST elevation MI with subsequent stenting of an OM. She has diffuse 3VD with a total right that is collateralized. She had smoewhat atypical presentation with her DM. [name] She has a benign systolic murmur and does not need SBE No chest pain.  [place] more.  Has LE edema that seems to be getting worse  . Labs reviewed 02/27/13  LDL 43  Normal LFT;s and TSH  HbA1c 6.3  ROS: Denies fever, malais, weight loss, blurry vision, decreased visual acuity, cough, sputum, SOB, hemoptysis, pleuritic pain, palpitaitons, heartburn, abdominal pain, melena, lower extremity edema, claudication, or rash.  All other systems reviewed and negative  General: Affect appropriate Healthy:  appears stated age HEENT: normal Neck supple with no adenopathy JVP normal no bruits no thyromegaly Lungs clear with no wheezing and good diaphragmatic motion Heart:  S1/S2 no murmur, no rub, gallop or click PMI normal Abdomen: benighn, BS positve, no tenderness, no AAA no bruit.  No HSM or HJR Distal pulses intact with no bruits Trace bilateral edema Neuro non-focal Skin warm and dry No muscular weakness   Current Outpatient Prescriptions  Medication Sig Dispense Refill  . amitriptyline (ELAVIL) 50 MG tablet Take 50 mg by mouth daily.        [name] amLODipine (NORVASC) 10 MG tablet Take 1 tablet (10 mg total) by mouth daily.  90 tablet  2  . aspirin EC 325 MG EC tablet Take 325 mg by mouth daily.        . carvedilol (COREG) 6.25 MG tablet Take 1 tablet (6.25 mg total) by mouth 2 (two) times daily with a meal.  60 tablet  1  . CINNAMON PO Take by mouth. 2000 mg caps. daily       . Ferrous Sulfate (IRON) 325 (65 FE) MG TABS Take 1 tablet by mouth daily.        . fish oil-omega-3 fatty acids 1000 MG capsule Take 1 g by mouth daily.        [name] gabapentin (NEURONTIN) 600 MG tablet Take 600 mg by mouth 2  (two) times daily.       [name] levothyroxine (SYNTHROID, LEVOTHROID) 100 MCG tablet Take 100 mcg by mouth daily.        [name] losartan-hydrochlorothiazide (HYZAAR) 100-25 MG per tablet Take 1 tablet by mouth daily.  90 tablet  3  . metFORMIN (GLUCOPHAGE) 1000 MG tablet Take 1 tablet (1,000 mg total) by mouth 2 (two) times daily with a meal.      . Multiple Vitamin (MULTIVITAMINS PO) Take by mouth. daily       . Nitrofurantoin Macrocrystal (MACRODANTIN PO) Take by mouth as needed.      . nitroGLYCERIN (NITROSTAT) 0.4 MG SL tablet Place 1 tablet (0.4 mg total) under the tongue every 5 (five) minutes as needed for chest pain.  25 tablet  3  . omeprazole (PRILOSEC OTC) 20 MG tablet Take 20 mg by mouth daily.        . pioglitazone (ACTOS) 30 MG tablet Take 30 mg by mouth daily.      . prasugrel (EFFIENT) 10 MG TABS Take 1 tablet (10 mg total) by mouth daily.  90 tablet  3  . rosuvastatin (CRESTOR) 10 MG tablet Take 1 tablet (10 mg total) by mouth daily.  90 tablet  3  . TECFIDERA 240 MG CPDR  240 mg 2 (two) times daily.       [name] venlafaxine (EFFEXOR-XR) 75 MG 24 hr capsule Take 75 mg by mouth daily.        . carvedilol (COREG) 6.25 MG tablet Take 1 tablet (6.25 mg total) by mouth 2 (two) times daily.  180 tablet  3   No current facility-administered medications for this visit.    Allergies  Review of patient's allergies indicates no known allergies.  Electrocardiogram:  SR rate 64 normal ECG   Assessment and Plan

## 2013-03-14 NOTE — Assessment & Plan Note (Signed)
Well controlled.  Continue current medications and low sodium Dash type diet.    

## 2013-03-14 NOTE — Assessment & Plan Note (Signed)
Low sodium diet  Stop Actos if possible PRN HCTZ  F/U LE duplex for venous insuficiency

## 2013-03-14 NOTE — Assessment & Plan Note (Signed)
Stable with no angina and good activity level.  Continue medical Rx Nitro called into [facility]

## 2013-03-14 NOTE — Assessment & Plan Note (Signed)
Cholesterol is at goal.  Continue current dose of statin and diet Rx.  No myalgias or side effects.  F/U  LFT's in 6 months. Lab Results  Component Value Date   LDLCALC  Value: UNABLE TO CALCULATE IF TRIGLYCERIDE OVER 400 mg/dL        Total Cholesterol/HDL:CHD Risk Coronary Heart Disease Risk Table                     Men   Women  1/2 Average Risk   3.4   3.3  Average Risk       5.0   4.4  2 X Average Risk   9.6   7.1  3 X Average Risk  23.4   11.0        Use the calculated Patient Ratio above and the CHD Risk Table to determine the patient's CHD Risk.        ATP III CLASSIFICATION (LDL):  <100     mg/dL   Optimal  161-096  mg/dL   Near or Above                    Optimal  130-159  mg/dL   Borderline  045-409  mg/dL   High  >811     mg/dL   Very High 04/02/4781

## 2013-03-14 NOTE — Assessment & Plan Note (Signed)
Would stop Actos given her worsening LE edema

## 2013-03-15 ENCOUNTER — Encounter (INDEPENDENT_AMBULATORY_CARE_PROVIDER_SITE_OTHER): Payer: 59

## 2013-03-15 DIAGNOSIS — R609 Edema, unspecified: Secondary | ICD-10-CM

## 2013-03-29 ENCOUNTER — Encounter: Payer: Self-pay | Admitting: Cardiovascular Disease

## 2013-03-29 MED ORDER — AMLODIPINE BESYLATE 10 MG PO TABS: 10.0000 mg | ORAL_TABLET | Freq: Every day | ORAL | Status: DC

## 2013-04-23 ENCOUNTER — Telehealth: Payer: Self-pay | Admitting: Neurology

## 2013-04-24 ENCOUNTER — Other Ambulatory Visit: Payer: Self-pay | Admitting: Neurology

## 2013-04-24 DIAGNOSIS — G35 Multiple sclerosis: Secondary | ICD-10-CM

## 2013-04-24 NOTE — Telephone Encounter (Signed)
Called patient back. Will get MRI brain and C spine. Follow up appointment 1 wk after MRI

## 2013-04-25 ENCOUNTER — Telehealth: Payer: Self-pay

## 2013-04-25 NOTE — Telephone Encounter (Signed)
I called patient and let her know that MRI has been approved by [employer]. She should her from someone within about a week now to schedule that. Please call us as soon as MRI is scheduled sop we can set up follow up. Patient stated she will.

## 2013-05-09 ENCOUNTER — Other Ambulatory Visit: Payer: Self-pay | Admitting: *Deleted

## 2013-05-09 DIAGNOSIS — F40240 Claustrophobia: Secondary | ICD-10-CM

## 2013-05-09 MED ORDER — ALPRAZOLAM 0.5 MG PO TABS: ORAL_TABLET | ORAL | Status: DC

## 2013-05-10 ENCOUNTER — Ambulatory Visit (INDEPENDENT_AMBULATORY_CARE_PROVIDER_SITE_OTHER): Payer: 59

## 2013-05-10 DIAGNOSIS — G35 Multiple sclerosis: Secondary | ICD-10-CM

## 2013-05-10 MED ORDER — GADOPENTETATE DIMEGLUMINE 469.01 MG/ML IV SOLN: 20.0000 mL | Freq: Once | INTRAVENOUS | Status: AC | PRN

## 2013-05-17 ENCOUNTER — Ambulatory Visit: Payer: 59 | Admitting: Neurology

## 2013-05-31 ENCOUNTER — Encounter: Payer: Self-pay | Admitting: Neurology

## 2013-06-07 ENCOUNTER — Other Ambulatory Visit: Payer: Self-pay

## 2013-06-11 ENCOUNTER — Other Ambulatory Visit: Payer: Self-pay | Admitting: Neurology

## 2013-06-11 MED ORDER — DIMETHYL FUMARATE 240 MG PO CPDR: 240.0000 mg | DELAYED_RELEASE_CAPSULE | Freq: Two times a day (BID) | ORAL | Status: DC

## 2013-06-13 ENCOUNTER — Other Ambulatory Visit: Payer: Self-pay

## 2013-06-13 MED ORDER — DIMETHYL FUMARATE 240 MG PO CPDR: 240.0000 mg | DELAYED_RELEASE_CAPSULE | Freq: Two times a day (BID) | ORAL | Status: DC

## 2013-06-14 ENCOUNTER — Encounter: Payer: Self-pay | Admitting: Neurology

## 2013-06-15 ENCOUNTER — Other Ambulatory Visit: Payer: Self-pay | Admitting: Neurology

## 2013-06-15 MED ORDER — AMITRIPTYLINE HCL 50 MG PO TABS: 50.0000 mg | ORAL_TABLET | Freq: Every day | ORAL | Status: DC

## 2013-06-15 MED ORDER — GABAPENTIN 600 MG PO TABS: 600.0000 mg | ORAL_TABLET | Freq: Two times a day (BID) | ORAL | Status: DC

## 2013-07-01 ENCOUNTER — Telehealth: Payer: Self-pay

## 2013-07-01 NOTE — Telephone Encounter (Signed)
[employer] sent [place] a letter stating the Prior Auth for Daisy Bennett has been approved effective until 05/12/2014.  No reference number was provided.

## 2013-07-23 ENCOUNTER — Other Ambulatory Visit: Payer: Self-pay | Admitting: Cardiovascular Disease

## 2013-07-25 ENCOUNTER — Other Ambulatory Visit: Payer: Self-pay | Admitting: Cardiovascular Disease

## 2013-07-27 ENCOUNTER — Other Ambulatory Visit: Payer: Self-pay | Admitting: Cardiovascular Disease

## 2013-07-30 NOTE — Telephone Encounter (Signed)
July is fine. Thanks!

## 2013-07-30 NOTE — Telephone Encounter (Signed)
Patient has had MRI and per last message was suppose to follow up one week after test was done. The patient is scheduled in July. Is this fine or needs to be seen sooner? Please advise so the encounter can be closed.

## 2013-08-24 ENCOUNTER — Encounter: Payer: Self-pay | Admitting: Cardiovascular Disease

## 2013-08-27 ENCOUNTER — Other Ambulatory Visit: Payer: Self-pay

## 2013-08-27 MED ORDER — ROSUVASTATIN CALCIUM 10 MG PO TABS: 10.0000 mg | ORAL_TABLET | Freq: Every day | ORAL | Status: DC

## 2013-08-31 ENCOUNTER — Encounter: Payer: Self-pay | Admitting: Cardiovascular Disease

## 2013-09-03 ENCOUNTER — Other Ambulatory Visit: Payer: Self-pay

## 2013-09-03 MED ORDER — ROSUVASTATIN CALCIUM 10 MG PO TABS: 10.0000 mg | ORAL_TABLET | Freq: Every day | ORAL | Status: DC

## 2013-09-13 ENCOUNTER — Ambulatory Visit (INDEPENDENT_AMBULATORY_CARE_PROVIDER_SITE_OTHER): Payer: 59 | Admitting: Cardiovascular Disease

## 2013-09-13 ENCOUNTER — Encounter: Payer: Self-pay | Admitting: Cardiovascular Disease

## 2013-09-13 VITALS — BP 140/64 | HR 72 | Ht 66.0 in | Wt 203.1 lb

## 2013-09-13 DIAGNOSIS — R011 Cardiac murmur, unspecified: Secondary | ICD-10-CM

## 2013-09-13 DIAGNOSIS — I10 Essential (primary) hypertension: Secondary | ICD-10-CM

## 2013-09-13 DIAGNOSIS — E119 Type 2 diabetes mellitus without complications: Secondary | ICD-10-CM

## 2013-09-13 DIAGNOSIS — I2510 Atherosclerotic heart disease of native coronary artery without angina pectoris: Secondary | ICD-10-CM

## 2013-09-13 DIAGNOSIS — E782 Mixed hyperlipidemia: Secondary | ICD-10-CM

## 2013-09-13 NOTE — Progress Notes (Signed)
Patient ID: Daisy Bennett, female   DOB: 10-31-1955, 58 y.o.   MRN: [id number] Daisy Bennett is seen today post stenting 2/11 She had a non ST elevation MI with subsequent stenting of an OM. She has diffuse 3VD with a total right that is collateralized. She had smoewhat atypical presentation with her DM. [name] She has a benign systolic murmur and does not need SBE No chest pain. [place] more. Has LE edema that seems to be getting worse  . Labs reviewed 02/27/13  LDL 43  Normal LFT;s and TSH  HbA1c 6.3       ROS: Denies fever, malais, weight loss, blurry vision, decreased visual acuity, cough, sputum, SOB, hemoptysis, pleuritic pain, palpitaitons, heartburn, abdominal pain, melena, lower extremity edema, claudication, or rash.  All other systems reviewed and negative  General: Affect appropriate Healthy:  appears stated age 58: normal Neck supple with no adenopathy JVP normal no bruits no thyromegaly Lungs clear with no wheezing and good diaphragmatic motion Heart:  S1/S2 1/6 SEM  murmur, no rub, gallop or click PMI normal Abdomen: benighn, BS positve, no tenderness, no AAA no bruit.  No HSM or HJR Distal pulses intact with no bruits No edema Neuro non-focal Skin warm and dry No muscular weakness   Current Outpatient Prescriptions  Medication Sig Dispense Refill  . ALPRAZolam (XANAX) 0.5 MG tablet Pt to take 1-2 tablets 30 minutes prior to MRI.  Can take an additional tablet when entering scanner if needed.  Will cause drowsiness.  3 tablet  0  . amitriptyline (ELAVIL) 50 MG tablet Take 1 tablet (50 mg total) by mouth daily.  90 tablet  3  . amLODipine (NORVASC) 10 MG tablet Take 1 tablet (10 mg total) by mouth daily.  90 tablet  3  . aspirin EC 325 MG EC tablet Take 325 mg by mouth daily.        . carvedilol (COREG) 6.25 MG tablet Take 1 tablet (6.25 mg total) by mouth 2 (two) times daily.  180 tablet  3  . carvedilol (COREG) 6.25 MG tablet Take 1 tablet (6.25 mg total) by mouth 2 (two)  times daily with a meal.  60 tablet  1  . CINNAMON PO Take by mouth. 2000 mg caps. daily       . Dimethyl Fumarate (TECFIDERA) 240 MG CPDR Take 1 capsule (240 mg total) by mouth 2 (two) times daily.  60 capsule  11  . Ferrous Sulfate (IRON) 325 (65 FE) MG TABS Take 1 tablet by mouth daily.        . fish oil-omega-3 fatty acids 1000 MG capsule Take 1 g by mouth daily.        [name] gabapentin (NEURONTIN) 600 MG tablet Take 1 tablet (600 mg total) by mouth 2 (two) times daily.  180 tablet  3  . glimepiride (AMARYL) 2 MG tablet Take 2 mg by mouth 2 (two) times daily.      . hydrochlorothiazide (MICROZIDE) 12.5 MG capsule TAKE 1 CAPSULE BY MOUTH EVERY DAY AS NEEDED.  30 capsule  3  . levothyroxine (SYNTHROID, LEVOTHROID) 100 MCG tablet Take 100 mcg by mouth daily.        [name] losartan-hydrochlorothiazide (HYZAAR) 100-25 MG per tablet Take 1 tablet by mouth daily.  90 tablet  3  . metFORMIN (GLUCOPHAGE) 1000 MG tablet Take 1 tablet (1,000 mg total) by mouth 2 (two) times daily with a meal.      . Multiple Vitamin (MULTIVITAMINS PO) Take by mouth. daily       .  Nitrofurantoin Macrocrystal (MACRODANTIN PO) Take by mouth as needed.      . nitroGLYCERIN (NITROSTAT) 0.4 MG SL tablet Place 1 tablet (0.4 mg total) under the tongue every 5 (five) minutes as needed for chest pain.  25 tablet  4  . omeprazole (PRILOSEC OTC) 20 MG tablet Take 20 mg by mouth daily.        . prasugrel (EFFIENT) 10 MG TABS Take 1 tablet (10 mg total) by mouth daily.  90 tablet  3  . rosuvastatin (CRESTOR) 10 MG tablet Take 1 tablet (10 mg total) by mouth daily.  90 tablet  3  . venlafaxine (EFFEXOR-XR) 75 MG 24 hr capsule Take 75 mg by mouth daily.         No current facility-administered medications for this visit.    Allergies  Review of patient's allergies indicates no known allergies.  Electrocardiogram:  8/13  SR normal rate 64   Assessment and Plan

## 2013-09-13 NOTE — Assessment & Plan Note (Signed)
Well controlled.  Continue current medications and low sodium Dash type diet.    

## 2013-09-13 NOTE — Assessment & Plan Note (Signed)
Discussed low carb diet.  Target hemoglobin A1c is 6.5 or less.  Continue current medications. Reviewed labs and A1c excellent low 6 range

## 2013-09-13 NOTE — Patient Instructions (Signed)
Your physician wants you to follow-up in: YEAR WITH DR NISHAN  You will receive a reminder letter in the mail two months in advance. If you don't receive a letter, please call our office to schedule the follow-up appointment.  Your physician recommends that you continue on your current medications as directed. Please refer to the Current Medication list given to you today. 

## 2013-09-13 NOTE — Assessment & Plan Note (Signed)
Stable with no angina and good activity level.  Continue medical Rx  

## 2013-09-13 NOTE — Assessment & Plan Note (Signed)
Benign soft systolic murmur no change no need for echo

## 2013-09-13 NOTE — Assessment & Plan Note (Signed)
Cholesterol is at goal.  Continue current dose of statin and diet Rx.  No myalgias or side effects.  F/U  LFT's in 6 months. Lab Results  Component Value Date   LDLCALC  Value: UNABLE TO CALCULATE IF TRIGLYCERIDE OVER 400 mg/dL        Total Cholesterol/HDL:CHD Risk Coronary Heart Disease Risk Table                     Men   Women  1/2 Average Risk   3.4   3.3  Average Risk       5.0   4.4  2 X Average Risk   9.6   7.1  3 X Average Risk  23.4   11.0        Use the calculated Patient Ratio above and the CHD Risk Table to determine the patient's CHD Risk.        ATP III CLASSIFICATION (LDL):  <100     mg/dL   Optimal  100-129  mg/dL   Near or Above                    Optimal  130-159  mg/dL   Borderline  160-189  mg/dL   High  >190     mg/dL   Very High 09/30/2009  Reviewed labs from primary 2014

## 2013-09-26 ENCOUNTER — Other Ambulatory Visit: Payer: Self-pay

## 2013-09-26 MED ORDER — CARVEDILOL 6.25 MG PO TABS: 6.2500 mg | ORAL_TABLET | Freq: Two times a day (BID) | ORAL | Status: DC

## 2013-09-26 MED ORDER — AMLODIPINE BESYLATE 10 MG PO TABS: 10.0000 mg | ORAL_TABLET | Freq: Every day | ORAL | Status: DC

## 2013-09-26 MED ORDER — PRASUGREL HCL 10 MG PO TABS: 10.0000 mg | ORAL_TABLET | Freq: Every day | ORAL | Status: DC

## 2013-09-26 MED ORDER — LOSARTAN POTASSIUM-HCTZ 100-25 MG PO TABS: 1.0000 | ORAL_TABLET | Freq: Every day | ORAL | Status: DC

## 2013-12-26 ENCOUNTER — Emergency Department (HOSPITAL_COMMUNITY): Payer: 59

## 2013-12-26 ENCOUNTER — Emergency Department (HOSPITAL_COMMUNITY)
Admission: EM | Admit: 2013-12-26 | Discharge: 2013-12-27 | Disposition: A | Discharge status: Home / Self Care | Payer: 59 | Attending: Emergency Medicine | Admitting: Emergency Medicine

## 2013-12-26 ENCOUNTER — Encounter (HOSPITAL_COMMUNITY): Payer: Self-pay | Admitting: Emergency Medicine

## 2013-12-26 DIAGNOSIS — Y9389 Activity, other specified: Secondary | ICD-10-CM | POA: Insufficient documentation

## 2013-12-26 DIAGNOSIS — Z87891 Personal history of nicotine dependence: Secondary | ICD-10-CM | POA: Insufficient documentation

## 2013-12-26 DIAGNOSIS — Z79899 Other long term (current) drug therapy: Secondary | ICD-10-CM | POA: Insufficient documentation

## 2013-12-26 DIAGNOSIS — S46909A Unspecified injury of unspecified muscle, fascia and tendon at shoulder and upper arm level, unspecified arm, initial encounter: Secondary | ICD-10-CM | POA: Insufficient documentation

## 2013-12-26 DIAGNOSIS — S7000XA Contusion of unspecified hip, initial encounter: Secondary | ICD-10-CM | POA: Insufficient documentation

## 2013-12-26 DIAGNOSIS — I10 Essential (primary) hypertension: Secondary | ICD-10-CM | POA: Insufficient documentation

## 2013-12-26 DIAGNOSIS — Y92009 Unspecified place in unspecified non-institutional (private) residence as the place of occurrence of the external cause: Secondary | ICD-10-CM | POA: Insufficient documentation

## 2013-12-26 DIAGNOSIS — M25511 Pain in right shoulder: Secondary | ICD-10-CM

## 2013-12-26 DIAGNOSIS — E039 Hypothyroidism, unspecified: Secondary | ICD-10-CM | POA: Insufficient documentation

## 2013-12-26 DIAGNOSIS — G35 Multiple sclerosis: Secondary | ICD-10-CM | POA: Insufficient documentation

## 2013-12-26 DIAGNOSIS — IMO0002 Reserved for concepts with insufficient information to code with codable children: Secondary | ICD-10-CM | POA: Insufficient documentation

## 2013-12-26 DIAGNOSIS — W19XXXA Unspecified fall, initial encounter: Secondary | ICD-10-CM

## 2013-12-26 DIAGNOSIS — E119 Type 2 diabetes mellitus without complications: Secondary | ICD-10-CM | POA: Insufficient documentation

## 2013-12-26 DIAGNOSIS — W1809XA Striking against other object with subsequent fall, initial encounter: Secondary | ICD-10-CM | POA: Insufficient documentation

## 2013-12-26 DIAGNOSIS — S4980XA Other specified injuries of shoulder and upper arm, unspecified arm, initial encounter: Secondary | ICD-10-CM | POA: Insufficient documentation

## 2013-12-26 DIAGNOSIS — Z7982 Long term (current) use of aspirin: Secondary | ICD-10-CM | POA: Insufficient documentation

## 2013-12-26 DIAGNOSIS — Z7902 Long term (current) use of antithrombotics/antiplatelets: Secondary | ICD-10-CM | POA: Insufficient documentation

## 2013-12-26 DIAGNOSIS — T148XXA Other injury of unspecified body region, initial encounter: Secondary | ICD-10-CM

## 2013-12-26 DIAGNOSIS — S7001XA Contusion of right hip, initial encounter: Secondary | ICD-10-CM

## 2013-12-26 DIAGNOSIS — I252 Old myocardial infarction: Secondary | ICD-10-CM | POA: Insufficient documentation

## 2013-12-26 DIAGNOSIS — W010XXA Fall on same level from slipping, tripping and stumbling without subsequent striking against object, initial encounter: Secondary | ICD-10-CM | POA: Insufficient documentation

## 2013-12-26 LAB — CBC WITH DIFFERENTIAL/PLATELET
Basophils Absolute: 0 10*3/uL (ref 0.0–0.1)
Basophils Relative: 0 % (ref 0–1)
Eosinophils Absolute: 0.2 10*3/uL (ref 0.0–0.7)
Eosinophils Relative: 2 % (ref 0–5)
HCT: 32.2 % — ABNORMAL LOW (ref 36.0–46.0)
Hemoglobin: 11.2 g/dL — ABNORMAL LOW (ref 12.0–15.0)
Lymphocytes Relative: 7 % — ABNORMAL LOW (ref 12–46)
Lymphs Abs: 0.7 10*3/uL (ref 0.7–4.0)
MCH: 32.2 pg (ref 26.0–34.0)
MCHC: 34.8 g/dL (ref 30.0–36.0)
MCV: 92.5 fL (ref 78.0–100.0)
Monocytes Absolute: 0.8 10*3/uL (ref 0.1–1.0)
Monocytes Relative: 8 % (ref 3–12)
Neutro Abs: 8.3 10*3/uL — ABNORMAL HIGH (ref 1.7–7.7)
Neutrophils Relative %: 83 % — ABNORMAL HIGH (ref 43–77)
Platelets: 227 10*3/uL (ref 150–400)
RBC: 3.48 MIL/uL — ABNORMAL LOW (ref 3.87–5.11)
RDW: 13.2 % (ref 11.5–15.5)
WBC: 10 10*3/uL (ref 4.0–10.5)

## 2013-12-26 LAB — BASIC METABOLIC PANEL
BUN: 15 mg/dL (ref 6–23)
CO2: 26 mEq/L (ref 19–32)
Calcium: 9.1 mg/dL (ref 8.4–10.5)
Chloride: 95 mEq/L — ABNORMAL LOW (ref 96–112)
Creatinine, Ser: 0.75 mg/dL (ref 0.50–1.10)
GFR calc Af Amer: >90 mL/min (ref >90)
GFR calc non Af Amer: >90 mL/min (ref >90)
Glucose, Bld: 125 mg/dL — ABNORMAL HIGH (ref 70–99)
Potassium: 3.8 mEq/L (ref 3.7–5.3)
Sodium: 137 mEq/L (ref 137–147)

## 2013-12-26 LAB — PROTIME-INR
INR: 0.88 (ref 0.00–1.49)
Prothrombin Time: 11.8 seconds (ref 11.6–15.2)

## 2013-12-26 LAB — TYPE AND SCREEN
ABO/RH(D): B POS
Antibody Screen: NEGATIVE

## 2013-12-26 LAB — ABO/RH: ABO/RH(D): B POS

## 2013-12-26 MED ORDER — FENTANYL CITRATE 0.05 MG/ML IJ SOLN
50.0000 ug | INTRAMUSCULAR | Status: DC | PRN
  Administered 2013-12-26 – 2013-12-27 (×3): 50 ug via INTRAVENOUS
  Filled 2013-12-26 (×3): qty 2

## 2013-12-26 MED ORDER — TETANUS-DIPHTH-ACELL PERTUSSIS 5-2.5-18.5 LF-MCG/0.5 IM SUSP
0.5000 mL | Freq: Once | INTRAMUSCULAR | Status: AC
  Administered 2013-12-26: 0.5 mL via INTRAMUSCULAR
  Filled 2013-12-26: qty 0.5

## 2013-12-26 MED ORDER — HYDROMORPHONE HCL PF 1 MG/ML IJ SOLN
0.5000 mg | INTRAMUSCULAR | Status: DC | PRN
  Administered 2013-12-26: 0.5 mg via INTRAVENOUS
  Filled 2013-12-26: qty 1

## 2013-12-26 NOTE — ED Notes (Signed)
Bed: WA21 Expected date:  Expected time:  Means of arrival:  Comments: ems 

## 2013-12-26 NOTE — ED Notes (Signed)
Per EMS: Pt was out in yard, took a step backwards and tripped over some stones behind her, fell onto R hip and R shoulder. Lots of swelling and pain to R hip. No deformity noted to shoulder. Small laceration on R hand. Pt given 250 mcg Fentanyl.

## 2013-12-26 NOTE — ED Provider Notes (Signed)
CSN: [id number]     Arrival date & time 12/26/13  1913 History   First MD Initiated Contact with Patient 12/26/13 1922     Chief Complaint  Patient presents with  . Hip Pain  . Shoulder Pain  . Fall     (Consider location/radiation/quality/duration/timing/severity/associated sxs/prior Treatment) HPI Comments: Patient is a 58 year old female with history of multiple sclerosis, NSTEMI, hypertension, diabetes, hypothyroidism who presents today after a fall. She was digging a hole in her yard when she stepped back and fell against a 1 foot brick wall. She landed directly on her right hip and right shoulder. She has not been ambulatory since that time. She has abrasions on her right knee and elbow, but is able to move both of these joints. She did not hit her head or lose consciousness. No other injuries. She has had frequent falls due to losing her balance from her MS. She was feeling well prior to the fall.   Patient is a 58 y.o. female presenting with hip pain, shoulder pain, and fall. The history is provided by the patient. No language interpreter was used.  Hip Pain Associated symptoms include arthralgias, joint swelling and myalgias. Pertinent negatives include no abdominal pain, chest pain, chills, fever, nausea or vomiting.  Shoulder Pain Associated symptoms include arthralgias, joint swelling and myalgias. Pertinent negatives include no abdominal pain, chest pain, chills, fever, nausea or vomiting.  Fall Associated symptoms include arthralgias, joint swelling and myalgias. Pertinent negatives include no abdominal pain, chest pain, chills, fever, nausea or vomiting.    Past Medical History  Diagnosis Date  . Multiple sclerosis   . SEMI (subendocardial myocardial infarction) 09/29/09    TOTAL RCA WITH STENT TO OM BRANCH  . Unspecified essential hypertension   . DM (diabetes mellitus)   . Goiter, unspecified   . Unspecified hypothyroidism   . Non-ST elevated myocardial infarction  (non-STEMI)    Past Surgical History  Procedure Laterality Date  . Cataract surgery  12/10  . Vesicovaginal fistula closure w/ tah    . Cesarean section    . Tonsillectomy     Family History  Problem Relation Age of Onset  . Hypertension Mother   . Heart failure Father     RECENTLY WITHIN THE LAST 2 WKS  . Aneurysm Brother     THORACIC/ABD ANEURYSM   History  Substance Use Topics  . Smoking status: Former Research scientist (life sciences)  . Smokeless tobacco: Never Used     Comment: QUIT MORE THAN 2 YRS. AGO  . Alcohol Use: Yes     Comment: OCCASIONAL ALCOHOL USE.   OB History   Grav Para Term Preterm Abortions TAB SAB Ect Mult Living                 Review of Systems  Constitutional: Negative for fever and chills.  Respiratory: Negative for shortness of breath.   Cardiovascular: Negative for chest pain.  Gastrointestinal: Negative for nausea, vomiting and abdominal pain.  Musculoskeletal: Positive for arthralgias, back pain, gait problem, joint swelling and myalgias.  Skin: Positive for wound.  All other systems reviewed and are negative.     Allergies  Review of patient's allergies indicates no known allergies.  Home Medications   Prior to Admission medications   Medication Sig Start Date End Date Taking? Authorizing Provider  amitriptyline (ELAVIL) 50 MG tablet Take 1 tablet (50 mg total) by mouth daily. 06/15/13  Yes Hulen Luster, DO  amLODipine (NORVASC) 10 MG tablet Take 1  tablet (10 mg total) by mouth daily. 09/26/13  Yes Josue Hector, MD  aspirin EC 325 MG EC tablet Take 325 mg by mouth at bedtime.    Yes Historical Provider, MD  carvedilol (COREG) 6.25 MG tablet Take 1 tablet (6.25 mg total) by mouth 2 (two) times daily with a meal. 09/26/13  Yes Josue Hector, MD  CINNAMON PO Take 2,000 mg by mouth daily.    Yes Historical Provider, MD  Dimethyl Fumarate (TECFIDERA) 240 MG CPDR Take 1 capsule (240 mg total) by mouth 2 (two) times daily. 06/13/13  Yes Hulen Luster,  DO  esomeprazole (NEXIUM) 20 MG capsule Take 20 mg by mouth daily at 12 noon.   Yes Historical Provider, MD  Ferrous Sulfate (IRON) 325 (65 FE) MG TABS Take 1 tablet by mouth daily.     Yes Historical Provider, MD  fish oil-omega-3 fatty acids 1000 MG capsule Take 1-2 g by mouth daily.    Yes Historical Provider, MD  gabapentin (NEURONTIN) 600 MG tablet Take 1 tablet (600 mg total) by mouth 2 (two) times daily. 06/15/13  Yes Hulen Luster, DO  glimepiride (AMARYL) 2 MG tablet Take 2 mg by mouth 2 (two) times daily.   Yes Historical Provider, MD  hydrochlorothiazide (MICROZIDE) 12.5 MG capsule Take 12.5 mg by mouth every Monday, Wednesday, and Friday.   Yes Historical Provider, MD  levothyroxine (SYNTHROID, LEVOTHROID) 100 MCG tablet Take 100 mcg by mouth daily.     Yes Historical Provider, MD  losartan-hydrochlorothiazide (HYZAAR) 100-25 MG per tablet Take 1 tablet by mouth daily. 09/26/13  Yes Josue Hector, MD  metFORMIN (GLUCOPHAGE) 1000 MG tablet Take 1 tablet (1,000 mg total) by mouth 2 (two) times daily with a meal. 03/21/12  Yes Josue Hector, MD  Multiple Vitamin (MULTIVITAMINS PO) Take 1 tablet by mouth at bedtime. daily   Yes Historical Provider, MD  nitroGLYCERIN (NITROSTAT) 0.4 MG SL tablet Place 1 tablet (0.4 mg total) under the tongue every 5 (five) minutes as needed for chest pain. 03/14/13 03/14/14 Yes Josue Hector, MD  prasugrel (EFFIENT) 10 MG TABS tablet Take 1 tablet (10 mg total) by mouth daily. 09/26/13  Yes Josue Hector, MD  rosuvastatin (CRESTOR) 10 MG tablet Take 1 tablet (10 mg total) by mouth daily. 09/03/13  Yes Josue Hector, MD  venlafaxine (EFFEXOR-XR) 75 MG 24 hr capsule Take 75 mg by mouth daily.     Yes Historical Provider, MD   BP 127/60  Pulse 71  Temp(Src) 98.2 F (36.8 C) (Oral)  Resp 22  SpO2 97% Physical Exam  Nursing note and vitals reviewed. Constitutional: She is oriented to person, place, and time. She appears well-developed and  well-nourished. No distress.  HENT:  Head: Normocephalic and atraumatic.  Right Ear: External ear normal.  Left Ear: External ear normal.  Nose: Nose normal.  Mouth/Throat: Oropharynx is clear and moist.  Eyes: Conjunctivae are normal.  Neck: Normal range of motion.  Cardiovascular: Normal rate, regular rhythm, normal heart sounds, intact distal pulses and normal pulses.   Pulses:      Radial pulses are 2+ on the right side, and 2+ on the left side.       Dorsalis pedis pulses are 2+ on the right side, and 2+ on the left side.       Posterior tibial pulses are 2+ on the right side, and 2+ on the left side.  Pulmonary/Chest: Effort normal and breath sounds normal. No  stridor. No respiratory distress. She has no wheezes. She has no rales.  Abdominal: Soft. She exhibits no distension.  Musculoskeletal: Normal range of motion.  Significant swelling to right hip. The swelling extends into her entire right leg. Sensation intact. Compartment soft.  There is a small abrasion to her anterior right knee 3 cm abrasion to right elbow.   Neurological: She is alert and oriented to person, place, and time. She has normal strength. GCS eye subscore is 4. GCS verbal subscore is 5. GCS motor subscore is 6.  Neurovascularly intact in both right arm and leg.   Skin: Skin is warm and dry. She is not diaphoretic. No erythema.  Psychiatric: She has a normal mood and affect. Her behavior is normal.    ED Course  Procedures (including critical care time) Labs Review Labs Reviewed  BASIC METABOLIC PANEL - Abnormal; Notable for the following:    Chloride 95 (*)    Glucose, Bld 125 (*)    All other components within normal limits  CBC WITH DIFFERENTIAL - Abnormal; Notable for the following:    RBC 3.48 (*)    Hemoglobin 11.2 (*)    HCT 32.2 (*)    Neutrophils Relative % 83 (*)    Neutro Abs 8.3 (*)    Lymphocytes Relative 7 (*)    All other components within normal limits  PROTIME-INR  TYPE AND SCREEN   ABO/RH    Imaging Review Dg Shoulder Right  12/26/2013   CLINICAL DATA:  Right shoulder pain secondary to a fall today.  EXAM: RIGHT SHOULDER - 2+ VIEW  COMPARISON:  None.  FINDINGS: There is no evidence of fracture or dislocation. There is no evidence of arthropathy or other focal bone abnormality. Soft tissues are unremarkable.  IMPRESSION: Normal exam.   Electronically Signed   By: Rozetta Nunnery M.D.   On: 12/26/2013 21:36   Dg Hip Complete Right  12/26/2013   CLINICAL DATA:  Status post fall; pain, swelling and bruising about the right hip.  EXAM: RIGHT HIP - COMPLETE 2+ VIEW  COMPARISON:  None.  FINDINGS: There is no evidence of fracture or dislocation. Both femoral heads are seated normally within their respective acetabula. The proximal right femur appears intact. No significant degenerative change is appreciated. The sacroiliac joints are unremarkable in appearance.  The visualized bowel gas pattern is grossly unremarkable in appearance. An external partially metallic device is seen overlying the upper pelvis.  IMPRESSION: No evidence of fracture or dislocation.   Electronically Signed   By: Garald Balding M.D.   On: 12/26/2013 21:37   Dg Femur Right  12/26/2013   CLINICAL DATA:  Status post fall; right hip pain, radiating to the knee.  EXAM: RIGHT FEMUR - 2 VIEW  COMPARISON:  None.  FINDINGS: The right femur appears intact. There is no evidence of fracture or dislocation. The right femoral head remains seated at the acetabulum. The knee joint is grossly unremarkable in appearance. No knee joint effusion is identified. No significant soft tissue abnormalities are characterized on radiograph. A partially metallic device is seen overlying the upper right hemipelvis.  IMPRESSION: No evidence of fracture or dislocation.   Electronically Signed   By: Garald Balding M.D.   On: 12/26/2013 21:37   Ct Hip Right Wo Contrast  12/26/2013   CLINICAL DATA:  Right hip pain, status post fall.  EXAM: CT OF THE  RIGHT HIP WITHOUT CONTRAST  TECHNIQUE: Multidetector CT imaging was performed according to the standard protocol.  Multiplanar CT image reconstructions were also generated.  COMPARISON:  Right hip radiographs performed earlier today at 9:13 p.m.  FINDINGS: There is no evidence of fracture or dislocation. The right femoral neck appears intact. The right femoral head remains seated at the acetabulum. The superior and inferior pubic rami remain intact. The pubic symphysis is grossly unremarkable in appearance. The visualized portions of the right sacroiliac joint are within normal limits. No significant joint space narrowing is seen.  A prominent soft tissue hematoma is noted along the right hip, superficial to the right greater femoral trochanter, measuring approximately 9.9 x 6.9 x 3.8 cm, with surrounding soft tissue injury extending along the right thigh and right flank. There is no additional evidence of soft tissue injury.  The visualized small and large bowel loops are grossly unremarkable in appearance. The bladder is mildly distended and grossly unremarkable. The patient is status post hysterectomy. No suspicious adnexal masses are seen. Minimal vascular calcifications are noted.  IMPRESSION: 1. No evidence of fracture or dislocation. 2. Prominent soft tissue hematoma along the right hip, superficial to the right greater femoral trochanter, measuring 9.9 x 6.9 x 3.8 cm, with surrounding soft tissue injury extending along the right thigh and right flank.   Electronically Signed   By: Garald Balding M.D.   On: 12/26/2013 23:52   Dg Knee Complete 4 Views Right  12/26/2013   CLINICAL DATA:  Status post fall; right knee pain.  EXAM: RIGHT KNEE - COMPLETE 4+ VIEW  COMPARISON:  None.  FINDINGS: There is no evidence of fracture or dislocation. The joint spaces are preserved. No significant degenerative change is seen; the patellofemoral joint is grossly unremarkable in appearance.  No significant joint effusion is  seen. The visualized soft tissues are normal in appearance.  IMPRESSION: No evidence of fracture or dislocation.   Electronically Signed   By: Garald Balding M.D.   On: 12/26/2013 21:38     EKG Interpretation None      MDM   Final diagnoses:  Fall  Hematoma of right hip  Right shoulder pain  Abrasion    Patient presents after a mechanical fall. XR show no acute abnormality. CT scan was done of right hip due to significant amount of swelling. Ct scan shows no fx or dislocation, prominent soft tissue hematoma without active bleeding. A compression wrap was applied around the hematoma and patient was given crutches as well as a prescription for a walker. Her tdap was updated given abrasions. Patient was given strict return instructions. Follow up with PCP. Vital signs stable for discharge. Dr. Dina Rich evaluated this patient and agrees with plan. Patient / Family / Caregiver informed of clinical course, understand medical decision-making process, and agree with plan.     Elwyn Lade, PA-C 12/27/13 1226

## 2013-12-27 MED ORDER — ONDANSETRON HCL 4 MG PO TABS: 4.0000 mg | ORAL_TABLET | Freq: Four times a day (QID) | ORAL | Status: DC

## 2013-12-27 MED ORDER — OXYCODONE-ACETAMINOPHEN 5-325 MG PO TABS: 1.0000 | ORAL_TABLET | Freq: Four times a day (QID) | ORAL | Status: DC | PRN

## 2013-12-27 NOTE — Discharge Instructions (Signed)
Hematoma A hematoma is a collection of blood. The collection of blood can turn into a hard, painful lump under the skin. Your skin may turn blue or yellow if the hematoma is close to the surface of the skin. Most hematomas get better in a few days to weeks. Some hematomas are serious and need medical care. Hematomas can be very small or very big. HOME CARE  Apply ice to the injured area:  Put ice in a plastic bag.  Place a towel between your skin and the bag.  Leave the ice on for 20 minutes, 2 3 times a day for the first 1 to 2 days.  After the first 2 days, switch to using warm packs on the injured area.  Raise (elevate) the injured area to lessen pain and puffiness (swelling). You may also wrap the area with an elastic bandage. Make sure the bandage is not wrapped too tight.  If you have a painful hematoma on your leg or foot, you may use crutches for a couple days.  Only take medicines as told by your doctor. GET HELP RIGHT AWAY IF:   Your pain gets worse.  Your pain is not controlled with medicine.  You have a fever.  Your puffiness gets worse.  Your skin turns more blue or yellow.  Your skin over the hematoma breaks or starts bleeding.  Your hematoma is in your chest or belly (abdomen) and you are short of breath, feel weak, or have a change in consciousness.  Your hematoma is on your scalp and you have a headache that gets worse or a change in alertness or consciousness. MAKE SURE YOU:   Understand these instructions.  Will watch your condition.  Will get help right away if you are not doing well or get worse. Document Released: 08/26/2004 Document Revised: 03/21/2013 Document Reviewed: 12/27/2012 [facility] Patient Information 2014 [place].  Fall Prevention and Home Safety Falls cause injuries and can affect all age groups. It is possible to prevent falls.  HOW TO PREVENT FALLS  Wear shoes with rubber soles that do not have an opening for your  toes.  Keep the inside and outside of your house well lit.  Use night lights throughout your home.  Remove clutter from floors.  Clean up floor spills.  Remove throw rugs or fasten them to the floor with carpet tape.  Do not place electrical cords across pathways.  Put grab bars by your tub, shower, and toilet. Do not use towel bars as grab bars.  Put handrails on both sides of the stairway. Fix loose handrails.  Do not climb on stools or stepladders, if possible.  Do not wax your floors.  Repair uneven or unsafe sidewalks, walkways, or stairs.  Keep items you use a lot within reach.  Be aware of pets.  Keep emergency numbers next to the telephone.  Put smoke detectors in your home and near bedrooms. Ask your doctor what other things you can do to prevent falls. Document Released: 05/15/2009 Document Revised: 01/18/2012 Document Reviewed: 10/19/2011 [facility] Patient Information 2014 [place], [place].  Abrasions An abrasion is a cut or scrape of the skin. Abrasions do not go through all layers of the skin. HOME CARE  If a bandage (dressing) was put on your wound, change it as told by your doctor. If the bandage sticks, soak it off with warm.  Wash the area with water and soap 2 times a day. Rinse off the soap. Pat the area dry with a  clean towel.  Put on medicated cream (ointment) as told by your doctor.  Change your bandage right away if it gets wet or dirty.  Only take medicine as told by your doctor.  See your doctor within 24 48 hours to get your wound checked.  Check your wound for redness, puffiness (swelling), or yellowish-white fluid (pus). GET HELP RIGHT AWAY IF:   You have more pain in the wound.  You have redness, swelling, or tenderness around the wound.  You have pus coming from the wound.  You have a fever or lasting symptoms for more than 2 3 days.  You have a fever and your symptoms suddenly get worse.  You have a bad smell coming from the  wound or bandage. MAKE SURE YOU:   Understand these instructions.  Will watch your condition.  Will get help right away if you are not doing well or get worse. Document Released: 01/05/2008 Document Revised: 04/12/2012 Document Reviewed: 06/22/2011 [facility] Patient Information 2014 [place], [place].

## 2013-12-28 NOTE — ED Provider Notes (Signed)
Medical screening examination/treatment/procedure(s) were conducted as a shared visit with non-physician practitioner(s) and myself.  I personally evaluated the patient during the encounter.   EKG Interpretation None      Patient presents following mechanical fall.  Nontoxic.  No LOC.  Large hematoma to the right lateral thigh.  Xrays neg.  CT with hematoma of the RLE.  Will compression and ice.  Supportive care.  Merryl Hacker, MD 12/28/13 2044

## 2014-02-06 ENCOUNTER — Other Ambulatory Visit: Payer: Self-pay | Admitting: *Deleted

## 2014-02-06 MED ORDER — ROSUVASTATIN CALCIUM 10 MG PO TABS: 10.0000 mg | ORAL_TABLET | Freq: Every day | ORAL | Status: DC

## 2014-02-19 ENCOUNTER — Encounter: Payer: Self-pay | Admitting: Neurology

## 2014-02-19 ENCOUNTER — Ambulatory Visit (INDEPENDENT_AMBULATORY_CARE_PROVIDER_SITE_OTHER): Payer: 59 | Admitting: Neurology

## 2014-02-19 VITALS — BP 145/72 | HR 76 | Ht 66.5 in | Wt 204.0 lb

## 2014-02-19 DIAGNOSIS — G35 Multiple sclerosis: Secondary | ICD-10-CM

## 2014-02-19 NOTE — Patient Instructions (Signed)
Overall you are doing fairly well but I do want to suggest a few things today:   Remember to drink plenty of fluid, eat healthy meals and do not skip any meals. Try to eat protein with a every meal and eat a healthy snack such as fruit or nuts in between meals. Try to keep a regular sleep-wake schedule and try to exercise daily, particularly in the form of walking, 20-30 minutes a day, if you can.   As far as your medications are concerned, I would like to suggest not making any changes at this time. I suggest adding in a daily vitamin D supplement  As far as diagnostic testing:  1)I would like you to have some blood work done at your next primary care appointment  Follow up in 1 year. Please call if you have any changes.  Two good primary care doctors are Derrill Memo at [place] [phone]) and Joneen Caraway at [place] [phone]  Please also call us for any test results so we can go over those with you on the phone.  My clinical assistant and will answer any of your questions and relay your messages to me and also relay most of my messages to you.   Our phone number is [phone]. We also have an after hours call service for urgent matters and there is a physician on-call for urgent questions. For any emergencies you know to call 911 or go to the nearest emergency room

## 2014-02-19 NOTE — Progress Notes (Signed)
[facility]    Provider:  Dr Janann Colonel Referring Provider: No ref. provider found Primary Care Physician:  Jani Gravel   HPI:  Daisy Bennett is a 58 y.o. female here as a MS follow up. Last week had episode where she had shaking in her right knee and then had weakness in both legs. Felt very unsteady on her feet, felt there was no strength. It was an otherwise normal day. Had not skipped a meal, had hydrated well. Lasted around 12 hours and then resolved completely. Had severe fall in May, unsure why she fell, hit her right thigh and had a large hematoma. Repeat MRI of brain and C spine from 05/2013 is overall stable.   Notes some continued difficulty with short term memory, has difficulty getting the right words out. Remote memory remains good. Continues to work full time, notes some difficulty with short term memory.   Prior visit 01/2013 Reports doing well. Denies any acute interval changes since her last visit. No new symptos/flare ups. Continues to take tecfidera bid. Tolerating it well, denies any GI upset or flushing. Reports brief episodes upon initiating the medication but this has since resolved. She has been on this medication since Oct 2013 after having taken Betaseron. She noted good response from Betaseron but discontinued due to skin irritation and ? Elevated LFTs. She reports she has not had any MRI imaging in >2 years. Last blood work in Dec 2013 and scheduled to have repeat blood work with PCP next month.  Reports that her back pain is stable and is not currently an issue. Does report intermittent episodes of muscle spams in LLE. Does note some tenderness to palpation in L post tibial region. Also notes increased swelling in bilat LE. Is scheduled to see cardiology in 1 month.   Review of Systems: Out of a complete 14 system review, the patient complains of only the following symptoms, and all other reviewed systems are negative. + weakness, flushing,  fatigue, walking difficulty, anxiety  History   Social History  . Marital Status: Single    Spouse Name: N/A    Number of Children: N/A  . Years of Education: N/A   Occupational History  . Not on file.   Social History Main Topics  . Smoking status: Former Research scientist (life sciences)  . Smokeless tobacco: Never Used     Comment: QUIT MORE THAN 2 YRS. AGO  . Alcohol Use: Yes     Comment: OCCASIONAL ALCOHOL USE.  . Drug Use: No  . Sexual Activity: Not on file   Other Topics Concern  . Not on file   Social History Narrative   The patient lives with husband. 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     Family History  Problem Relation Age of Onset  . Hypertension Mother   . Heart failure Father     RECENTLY WITHIN THE LAST 2 WKS  . Aneurysm Brother     THORACIC/ABD ANEURYSM    Past Medical History  Diagnosis Date  . Multiple sclerosis   . SEMI (subendocardial myocardial infarction) 09/29/09    TOTAL RCA WITH STENT TO OM BRANCH  . Unspecified essential hypertension   . DM (diabetes mellitus)   . Goiter, unspecified   . Unspecified hypothyroidism   . Non-ST elevated myocardial infarction (non-STEMI)     Past Surgical History  Procedure Laterality Date  . Cataract surgery  12/10  . Vesicovaginal fistula closure w/ tah    .  Cesarean section    . Tonsillectomy      Current Outpatient Prescriptions  Medication Sig Dispense Refill  . amitriptyline (ELAVIL) 50 MG tablet Take 1 tablet (50 mg total) by mouth daily.  90 tablet  3  . amLODipine (NORVASC) 10 MG tablet Take 1 tablet (10 mg total) by mouth daily.  90 tablet  3  . aspirin EC 325 MG EC tablet Take 325 mg by mouth at bedtime.       . carvedilol (COREG) 6.25 MG tablet Take 1 tablet (6.25 mg total) by mouth 2 (two) times daily with a meal.  180 tablet  3  . CINNAMON PO Take 2,000 mg by mouth daily.       . Dimethyl Fumarate (TECFIDERA) 240 MG CPDR Take 1 capsule (240 mg total) by mouth 2 (two) times daily.  60  capsule  11  . esomeprazole (NEXIUM) 20 MG capsule Take 20 mg by mouth daily at 12 noon.      . Ferrous Sulfate (IRON) 325 (65 FE) MG TABS Take 1 tablet by mouth daily.        . fish oil-omega-3 fatty acids 1000 MG capsule Take 1-2 g by mouth daily.       [name] gabapentin (NEURONTIN) 600 MG tablet Take 1 tablet (600 mg total) by mouth 2 (two) times daily.  180 tablet  3  . glimepiride (AMARYL) 2 MG tablet Take 2 mg by mouth 2 (two) times daily.      . hydrochlorothiazide (MICROZIDE) 12.5 MG capsule Take 12.5 mg by mouth every Monday, Wednesday, and Friday.      . levothyroxine (SYNTHROID, LEVOTHROID) 100 MCG tablet Take 100 mcg by mouth daily.        [name] losartan-hydrochlorothiazide (HYZAAR) 100-25 MG per tablet Take 1 tablet by mouth daily.  90 tablet  3  . metFORMIN (GLUCOPHAGE) 1000 MG tablet Take 1 tablet (1,000 mg total) by mouth 2 (two) times daily with a meal.      . Multiple Vitamin (MULTIVITAMINS PO) Take 1 tablet by mouth at bedtime. daily      . nitroGLYCERIN (NITROSTAT) 0.4 MG SL tablet Place 1 tablet (0.4 mg total) under the tongue every 5 (five) minutes as needed for chest pain.  25 tablet  4  . ondansetron (ZOFRAN) 4 MG tablet Take 1 tablet (4 mg total) by mouth every 6 (six) hours.  12 tablet  0  . oxyCODONE-acetaminophen (PERCOCET/ROXICET) 5-325 MG per tablet Take 1-2 tablets by mouth every 6 (six) hours as needed for severe pain.  30 tablet  0  . prasugrel (EFFIENT) 10 MG TABS tablet Take 1 tablet (10 mg total) by mouth daily.  90 tablet  3  . rosuvastatin (CRESTOR) 10 MG tablet Take 1 tablet (10 mg total) by mouth daily.  30 tablet  0  . venlafaxine (EFFEXOR-XR) 75 MG 24 hr capsule Take 75 mg by mouth daily.         No current facility-administered medications for this visit.    Allergies as of 02/19/2014  . (No Known Allergies)    Vitals: BP 145/72  Pulse 76  Ht 5' 6.5" (1.689 m)  Wt 204 lb (92.534 kg)  BMI 32.44 kg/m2 Last Weight:  Wt Readings from Last 1 Encounters:    02/19/14 204 lb (92.534 kg)   Last Height:   Ht Readings from Last 1 Encounters:  02/19/14 5' 6.5" (1.689 m)     Exam: Gen: NAD, conversant Eyes: anicteric sclerae, moist conjunctivae  HENT: Atraumati Neck: Trachea midline; supple,  Lungs: CTA, no wheezing, rales, rhonic                          CV: RRR, no MRG Abdomen: Soft, non-tender;  Extremities: marked bilat LE pitting edema Skin: Normal temperature, no rash,  Psych: Appropriate affect, pleasant   Neuro: MS: AA&Ox3, appropriately interactive, normal affect   Speech: fluent w/o paraphasic error  Memory: good recent and remote recall  CN: S/p cataract surgery L pupil, EOMI no nystagmus, no ptosis, sensation intact to LT V1-V3 bilat, face symmetric, no weakness, hearing decreased on L, palate elevates symmetrically, shoulder shrug 5/5 bilat,  tongue protrudes midline, no fasiculations noted.  Motor: normal bulk and tone Strength: 5/5  In all extremities  Coord: mild bilat extension tremor of hands, mild difficulty with RAM bilat hands  Reflexes: symmetrical, bilat downgoing toes  Sens: LT intact in all extremities  Gait: posture, stance, stride and arm-swing normal. Tandem gait difficult to do. Able to walk on heels and toes.   Assessment:  After physical and neurologic examination, review of laboratory studies, imaging, neurophysiology testing and pre-existing records, assessment will be reviewed on the problem list.  Plan:  Treatment plan and additional workup will be reviewed under Problem List.  58y/o woman with hx of RRMS for >20years, currently well controlled on regimen of Tecfidera 240mg  bid. Returns to clinic today for follow up visit, reports she is doing well overall, no acute flare ups. Notes that her back pain and LLE pain are improved compared to prior visit.   1) RRMS -appears clinically stable -will continue on Tecfidera  -patient scheduled to have CBC checked with PCP next month and email  results to our office -start daily Vitamin D supplement   2)Chronic lumbar back pain: -appears clinically stable -continue on Neurontin for symptomatic relief -in future if symptoms worsen could consider increase neurontin, PT referral, steroid injection  3)Cognitive decline -will check B12, B1, TSH -follow up MRI brain   A total of 30 minutes was spent in with this patient. Over half this time was spent on counseling patient on the diagnosis and different therapeutic options available. We discussed progression and prognosis of MS. Questions about Tecfidera, including monitoring was discussed. Discussed possible causes of her cognitive decline   Jim Like, DO  [facility] [address] [place], [place] [address]  Phone [phone] Fax [phone]

## 2014-03-22 ENCOUNTER — Telehealth: Payer: Self-pay

## 2014-03-22 NOTE — Telephone Encounter (Signed)
[employer] notified us they have approved our request for coverage on Tecfidera effective until 03/21/2015 Ref # [id number]

## 2014-04-04 ENCOUNTER — Encounter: Payer: Self-pay | Admitting: Cardiovascular Disease

## 2014-04-05 ENCOUNTER — Other Ambulatory Visit: Payer: Self-pay

## 2014-04-05 MED ORDER — HYDROCHLOROTHIAZIDE 12.5 MG PO CAPS: 12.5000 mg | ORAL_CAPSULE | ORAL | Status: DC

## 2014-04-11 ENCOUNTER — Encounter: Payer: Self-pay | Admitting: Cardiovascular Disease

## 2014-04-15 ENCOUNTER — Encounter: Payer: Self-pay | Admitting: Neurology

## 2014-04-15 ENCOUNTER — Other Ambulatory Visit: Payer: Self-pay

## 2014-04-15 MED ORDER — GABAPENTIN 600 MG PO TABS: 600.0000 mg | ORAL_TABLET | Freq: Two times a day (BID) | ORAL | Status: DC

## 2014-04-15 MED ORDER — HYDROCHLOROTHIAZIDE 12.5 MG PO CAPS: 12.5000 mg | ORAL_CAPSULE | Freq: Every day | ORAL | Status: DC | PRN

## 2014-04-15 MED ORDER — AMITRIPTYLINE HCL 50 MG PO TABS: 50.0000 mg | ORAL_TABLET | Freq: Every day | ORAL | Status: DC

## 2014-04-15 MED ORDER — DIMETHYL FUMARATE 240 MG PO CPDR: 240.0000 mg | DELAYED_RELEASE_CAPSULE | Freq: Two times a day (BID) | ORAL | Status: DC

## 2014-04-15 NOTE — Telephone Encounter (Signed)
I have reviewed patient's chart and see documentation from Dr. Kyla Balzarine last ov with patient 09/13/13 that patient may take HCTZ 12.5 mg once daily as needed.  I sent refill of #90 with 3 refills to patient's pharmacy

## 2014-06-07 ENCOUNTER — Encounter: Payer: Self-pay | Admitting: *Deleted

## 2014-09-17 ENCOUNTER — Encounter: Payer: Self-pay | Admitting: Neurology

## 2014-09-17 MED ORDER — AMITRIPTYLINE HCL 50 MG PO TABS: 50.0000 mg | ORAL_TABLET | Freq: Every day | ORAL | Status: DC

## 2014-09-17 NOTE — Progress Notes (Signed)
Patient ID: POLA FURNO, female   DOB: 1956/03/22, 59 y.o.   MRN: [id number]   Daisy Bennett is seen today post stenting 2/11 She had a non ST elevation MI with subsequent stenting of an OM. She has diffuse 3VD with a total right that is collateralized. She had smoewhat atypical presentation with her DM. [name] She has a benign systolic murmur and does not need SBE No chest pain. [place] more. Has LE edema that seems to be getting worse    Labs 9/15   Remarkable for Ht 32 and Cr .75  LDL 45 TC 123 normal LFTs  She is walking 2 miles / day She started smoking again in January Counseled for less than 10 minutes on cessation I suspect she can quit as she had been off for 6 years   ROS: Denies fever, malais, weight loss, blurry vision, decreased visual acuity, cough, sputum, SOB, hemoptysis, pleuritic pain, palpitaitons, heartburn, abdominal pain, melena, lower extremity edema, claudication, or rash.  All other systems reviewed and negative  General: Affect appropriate Healthy:  appears stated age 60: normal Neck supple with no adenopathy JVP normal no bruits no thyromegaly Lungs clear with no wheezing and good diaphragmatic motion Heart:  S1/S2 1/6 SEM  murmur, no rub, gallop or click PMI normal Abdomen: benighn, BS positve, no tenderness, no AAA no bruit.  No HSM or HJR Distal pulses intact with no bruits No edema Neuro non-focal Skin warm and dry No muscular weakness   Current Outpatient Prescriptions  Medication Sig Dispense Refill  . amitriptyline (ELAVIL) 50 MG tablet Take 1 tablet (50 mg total) by mouth daily. 90 tablet 1  . amLODipine (NORVASC) 10 MG tablet Take 1 tablet (10 mg total) by mouth daily. 90 tablet 3  . aspirin EC 325 MG EC tablet Take 325 mg by mouth at bedtime.     . carvedilol (COREG) 6.25 MG tablet Take 1 tablet (6.25 mg total) by mouth 2 (two) times daily with a meal. 180 tablet 3  . CINNAMON PO Take 2,000 mg by mouth daily.     . Dimethyl Fumarate (TECFIDERA) 240 MG  CPDR Take 1 capsule (240 mg total) by mouth 2 (two) times daily. 60 capsule 11  . esomeprazole (NEXIUM) 20 MG capsule Take 20 mg by mouth daily at 12 noon.    . Ferrous Sulfate (IRON) 325 (65 FE) MG TABS Take 1 tablet by mouth daily.      . fish oil-omega-3 fatty acids 1000 MG capsule Take 1-2 g by mouth daily.     [name] gabapentin (NEURONTIN) 600 MG tablet Take 1 tablet (600 mg total) by mouth 2 (two) times daily. 180 tablet 3  . glimepiride (AMARYL) 2 MG tablet Take 2 mg by mouth daily with breakfast.     . hydrochlorothiazide (HYDRODIURIL) 25 MG tablet Take 25 mg by mouth daily.    [name] levothyroxine (SYNTHROID, LEVOTHROID) 100 MCG tablet Take 100 mcg by mouth daily.      [name] losartan-hydrochlorothiazide (HYZAAR) 100-25 MG per tablet Take 1 tablet by mouth daily. 90 tablet 3  . metFORMIN (GLUCOPHAGE) 1000 MG tablet Take 1 tablet (1,000 mg total) by mouth 2 (two) times daily with a meal.    . Multiple Vitamin (MULTIVITAMINS PO) Take 1 tablet by mouth at bedtime. daily    . ondansetron (ZOFRAN) 4 MG tablet Take 1 tablet (4 mg total) by mouth every 6 (six) hours. 12 tablet 0  . oxyCODONE-acetaminophen (PERCOCET/ROXICET) 5-325 MG per tablet Take 1-2 tablets  by mouth every 6 (six) hours as needed for severe pain. 30 tablet 0  . prasugrel (EFFIENT) 10 MG TABS tablet Take 1 tablet (10 mg total) by mouth daily. 90 tablet 3  . rosuvastatin (CRESTOR) 10 MG tablet Take 1 tablet (10 mg total) by mouth daily. 30 tablet 0  . venlafaxine (EFFEXOR-XR) 75 MG 24 hr capsule Take 75 mg by mouth daily.      . nitroGLYCERIN (NITROSTAT) 0.4 MG SL tablet Place 1 tablet (0.4 mg total) under the tongue every 5 (five) minutes as needed for chest pain. 25 tablet 4   No current facility-administered medications for this visit.    Allergies  Review of patient's allergies indicates no known allergies.  Electrocardiogram:  03/14/13   SR normal rate 64   SR rate 67  Normal no change 09/18/14   Assessment and Plan

## 2014-09-18 ENCOUNTER — Encounter: Payer: Self-pay | Admitting: Cardiovascular Disease

## 2014-09-18 ENCOUNTER — Ambulatory Visit (INDEPENDENT_AMBULATORY_CARE_PROVIDER_SITE_OTHER): Payer: 59 | Admitting: Cardiovascular Disease

## 2014-09-18 VITALS — BP 132/78 | HR 67 | Ht 66.0 in | Wt 195.0 lb

## 2014-09-18 DIAGNOSIS — I2510 Atherosclerotic heart disease of native coronary artery without angina pectoris: Secondary | ICD-10-CM

## 2014-09-18 DIAGNOSIS — I10 Essential (primary) hypertension: Secondary | ICD-10-CM

## 2014-09-18 DIAGNOSIS — I25119 Atherosclerotic heart disease of native coronary artery with unspecified angina pectoris: Secondary | ICD-10-CM

## 2014-09-18 MED ORDER — ROSUVASTATIN CALCIUM 10 MG PO TABS: 10.0000 mg | ORAL_TABLET | Freq: Every day | ORAL | Status: DC

## 2014-09-18 MED ORDER — LOSARTAN POTASSIUM-HCTZ 100-25 MG PO TABS
1.0000 | ORAL_TABLET | Freq: Every day | ORAL | Status: DC
Start: 2014-09-18 — End: 2015-05-19

## 2014-09-18 MED ORDER — CARVEDILOL 6.25 MG PO TABS: 6.2500 mg | ORAL_TABLET | Freq: Two times a day (BID) | ORAL | Status: DC

## 2014-09-18 MED ORDER — PRASUGREL HCL 10 MG PO TABS: 10.0000 mg | ORAL_TABLET | Freq: Every day | ORAL | Status: DC

## 2014-09-18 MED ORDER — NITROGLYCERIN 0.4 MG SL SUBL: 0.4000 mg | SUBLINGUAL_TABLET | SUBLINGUAL | Status: DC | PRN

## 2014-09-18 MED ORDER — AMLODIPINE BESYLATE 10 MG PO TABS: 10.0000 mg | ORAL_TABLET | Freq: Every day | ORAL | Status: DC

## 2014-09-18 NOTE — Assessment & Plan Note (Signed)
Discussed low carb diet.  Target hemoglobin A1c is 6.5 or less.  Continue current medications.  A1c with primary was 6.2

## 2014-09-18 NOTE — Assessment & Plan Note (Signed)
Improved no weakness f/u neuro labs for MS meds normal LFTls Thyroid and CBC September

## 2014-09-18 NOTE — Patient Instructions (Signed)
Your physician wants you to follow-up in:  6 MONTHS WITH DR NISHAN  You will receive a reminder letter in the mail two months in advance. If you don't receive a letter, please call our office to schedule the follow-up appointment. Your physician recommends that you continue on your current medications as directed. Please refer to the Current Medication list given to you today. 

## 2014-09-18 NOTE — Assessment & Plan Note (Signed)
Stable with no angina and good activity level.  Continue medical Rx Refill on nitro given

## 2014-09-18 NOTE — Assessment & Plan Note (Signed)
Well controlled.  Continue current medications and low sodium Dash type diet.    

## 2014-09-24 ENCOUNTER — Encounter: Payer: Self-pay | Admitting: Cardiovascular Disease

## 2014-11-19 ENCOUNTER — Encounter: Payer: Self-pay | Admitting: Neurology

## 2014-11-20 MED ORDER — AMITRIPTYLINE HCL 50 MG PO TABS: 50.0000 mg | ORAL_TABLET | Freq: Every day | ORAL | Status: DC

## 2014-11-20 NOTE — Telephone Encounter (Signed)
Originally ordered on 11/14

## 2015-01-27 ENCOUNTER — Other Ambulatory Visit: Payer: Self-pay

## 2015-02-25 ENCOUNTER — Ambulatory Visit: Payer: 59 | Admitting: Neurology

## 2015-03-10 ENCOUNTER — Ambulatory Visit (INDEPENDENT_AMBULATORY_CARE_PROVIDER_SITE_OTHER): Payer: 59 | Admitting: Neurology

## 2015-03-10 ENCOUNTER — Encounter: Payer: Self-pay | Admitting: Neurology

## 2015-03-10 VITALS — BP 162/77 | HR 78 | Ht 66.0 in | Wt 190.8 lb

## 2015-03-10 DIAGNOSIS — G35 Multiple sclerosis: Secondary | ICD-10-CM | POA: Diagnosis not present

## 2015-03-10 DIAGNOSIS — Z5181 Encounter for therapeutic drug level monitoring: Secondary | ICD-10-CM | POA: Diagnosis not present

## 2015-03-10 NOTE — Patient Instructions (Addendum)
   We will check blood work today, check the blood count. I will set you up for MRI evaluation of the brain to follow-up for the multiple sclerosis. We will continue Tecfidera for now, follow-up in about 6-8 months.   Multiple Sclerosis Multiple sclerosis (MS) is a disease of the central nervous system. It leads to the loss of the insulating covering of the nerves (myelin sheath) of your brain. When this happens, brain signals do not get sent properly or may not get sent at all. The age of onset of MS varies.  CAUSES The cause of MS is unknown. However, it is more common in the [place] than in the [place]. RISK FACTORS There is a higher number of women with MS than men. MS is not an illness that is passed down to you from your family members (inherited). However, your risk of MS is higher if you have a relative with MS. SIGNS AND SYMPTOMS  The symptoms of MS occur in episodes or attacks. These attacks may last weeks to months. There may be long periods of almost no symptoms between attacks. The symptoms of MS vary. This is because of the many different ways it affects the central nervous system. The main symptoms of MS include:  Vision problems and eye pain.  Numbness.  Weakness.  Inability to move your arms, hands, feet, or legs (paralysis).  Balance problems.  Tremors. DIAGNOSIS  Your health care provider can diagnose MS with the help of imaging exams and lab tests. These may include specialized X-ray exams and spinal fluid tests. The best imaging exam to confirm a diagnosis of MS is an MRI. TREATMENT  There is no known cure for MS, but there are medicines that can decrease the number and frequency of attacks. Steroids are often used for short-term relief. Physical and occupational therapy may also help. There are also many new alternative or complementary treatments available to help control the symptoms of MS. Ask your health care provider if any of these  other options are right for you. HOME CARE INSTRUCTIONS   Take medicines as directed by your health care provider.  Exercise as directed by your health care provider. SEEK MEDICAL CARE IF: You begin to feel depressed. SEEK IMMEDIATE MEDICAL CARE IF:  You develop paralysis.  You have problems with bladder, bowel, or sexual function.  You develop mental changes, such as forgetfulness or mood swings.  You have a period of uncontrolled movements (seizure). Document Released: 07/16/2000 Document Revised: 07/24/2013 Document Reviewed: 03/26/2013 [facility] Patient Information 2015 [place], [place]. This information is not intended to replace advice given to you by your health care provider. Make sure you discuss any questions you have with your health care provider.

## 2015-03-10 NOTE — Progress Notes (Signed)
Reason for visit: Multiple sclerosis  Daisy Bennett is an 59 y.o. female  History of present illness:  Daisy Bennett is a 59 year old right-handed white female with a history of multiple sclerosis that was diagnosed in 49. Initially, the patient was followed through [facility], she was placed on Betaseron, and was on this medication for a number of years. In October 2013, the patient went off of Betaseron in part because she did not tolerate the subcutaneous injections, and in part secondary to liver enzyme elevations from the Betaseron. The patient indicated that her diagnosis occurred after she lost hearing in the left ear, MRI evaluation showed brain lesions, and eventually she developed numbness on the left lower body including the left leg associated with a gait disorder. She never had lumbar puncture done, she was started on medications shortly thereafter. The patient has done relatively well without significant progression of her multiple sclerosis. The patient has diabetes, she denies any significant numbness or weakness of the extremities, she does have chronic gait instability, she will fall on occasion, but she does not use a cane for ambulation. She denies any issues controlling the bowels or the bladder. She denies any visual field changes, dizziness, double vision, or weakness. She recently had blood work done in March 2016 that included a liver panel that did not show a liver enzyme elevation. She has not had a CBC in greater than one year. She did sustain a bout of shingles on the right T4 level. She has had 2 episodes of near-syncope, one before the outbreak of shingles, one afterwards. The patient did not check her blood sugar during the episodes that left her with fatigue afterwards. She denies any palpitations of the heart. She does have a history of heart disease. She comes to this office for an evaluation.  Past Medical History  Diagnosis Date  . Multiple sclerosis   .  SEMI (subendocardial myocardial infarction) 09/29/09    TOTAL RCA WITH STENT TO OM BRANCH  . Unspecified essential hypertension   . DM (diabetes mellitus)   . Goiter, unspecified   . Unspecified hypothyroidism   . Non-ST elevated myocardial infarction (non-STEMI)   . Shingles     Past Surgical History  Procedure Laterality Date  . Cataract surgery  12/10  . Vesicovaginal fistula closure w/ tah    . Cesarean section    . Tonsillectomy      Family History  Problem Relation Age of Onset  . Hypertension Mother   . Heart failure Father     RECENTLY WITHIN THE LAST 2 WKS  . Aneurysm Brother     THORACIC/ABD ANEURYSM  . Multiple sclerosis Neg Hx   . Fibromyalgia Sister     Social history:  reports that she has been smoking.  She has never used smokeless tobacco. She reports that she drinks alcohol. She reports that she does not use illicit drugs.    Allergies  Allergen Reactions  . Betaseron [Interferon Beta-1b]     Increased LFT's    Medications:  Prior to Admission medications   Medication Sig Start Date End Date Taking? Authorizing Provider  amitriptyline (ELAVIL) 50 MG tablet Take 1 tablet (50 mg total) by mouth daily. 11/20/14  Yes Kathrynn Ducking, MD  amLODipine (NORVASC) 10 MG tablet Take 1 tablet (10 mg total) by mouth daily. 09/18/14  Yes Josue Hector, MD  aspirin EC 325 MG EC tablet Take 325 mg by mouth at bedtime.  Yes Historical Provider, MD  carvedilol (COREG) 6.25 MG tablet Take 1 tablet (6.25 mg total) by mouth 2 (two) times daily with a meal. 09/18/14  Yes Josue Hector, MD  CINNAMON PO Take 2,000 mg by mouth daily.    Yes Historical Provider, MD  Dimethyl Fumarate (TECFIDERA) 240 MG CPDR Take 1 capsule (240 mg total) by mouth 2 (two) times daily. 04/15/14  Yes Drema Dallas, DO  esomeprazole (NEXIUM) 20 MG capsule Take 20 mg by mouth daily at 12 noon.   Yes Historical Provider, MD  fenofibrate 160 MG tablet Take 160 mg by mouth daily.   Yes Historical  Provider, MD  Ferrous Sulfate (IRON) 325 (65 FE) MG TABS Take 1 tablet by mouth daily.     Yes Historical Provider, MD  fish oil-omega-3 fatty acids 1000 MG capsule Take 1-2 g by mouth daily.    Yes Historical Provider, MD  gabapentin (NEURONTIN) 600 MG tablet Take 1 tablet (600 mg total) by mouth 2 (two) times daily. 04/15/14  Yes Drema Dallas, DO  glimepiride (AMARYL) 2 MG tablet Take 2 mg by mouth 2 (two) times daily.    Yes Historical Provider, MD  hydrochlorothiazide (HYDRODIURIL) 25 MG tablet Take 25 mg by mouth daily.   Yes Historical Provider, MD  levothyroxine (SYNTHROID, LEVOTHROID) 100 MCG tablet Take 100 mcg by mouth daily.     Yes Historical Provider, MD  losartan-hydrochlorothiazide (HYZAAR) 100-25 MG per tablet Take 1 tablet by mouth daily. 09/18/14  Yes Josue Hector, MD  metFORMIN (GLUCOPHAGE) 1000 MG tablet Take 1 tablet (1,000 mg total) by mouth 2 (two) times daily with a meal. 03/21/12  Yes Josue Hector, MD  Multiple Vitamin (MULTIVITAMINS PO) Take 1 tablet by mouth at bedtime. daily   Yes Historical Provider, MD  nitroGLYCERIN (NITROSTAT) 0.4 MG SL tablet Place 1 tablet (0.4 mg total) under the tongue every 5 (five) minutes as needed for chest pain. 09/18/14 09/18/15 Yes Josue Hector, MD  prasugrel (EFFIENT) 10 MG TABS tablet Take 1 tablet (10 mg total) by mouth daily. 09/18/14  Yes Josue Hector, MD  rosuvastatin (CRESTOR) 10 MG tablet Take 1 tablet (10 mg total) by mouth daily. 09/18/14  Yes Josue Hector, MD  venlafaxine (EFFEXOR-XR) 75 MG 24 hr capsule Take 75 mg by mouth daily.     Yes Historical Provider, MD    ROS:  Out of a complete 14 system review of symptoms, the patient complains only of the following symptoms, and all other reviewed systems are negative.  Fatigue Hearing loss, ringing in the ears Heart murmur Bruising easily Memory loss Depression Joint pain  Blood pressure 162/77, pulse 78, height 5\' 6"  (1.676 m), weight 190 lb 12.8 oz (86.546  kg).  Physical Exam  General: The patient is alert and cooperative at the time of the examination.  Skin: No significant peripheral edema is noted.   Neurologic Exam  Mental status: The patient is alert and oriented x 3 at the time of the examination. The patient has apparent normal recent and remote memory, with an apparently normal attention span and concentration ability. Mini-Mental Status Examination done today shows a total score of 30/30. The patient is able to name 14 animals in 30 seconds.   Cranial nerves: Facial symmetry is present. Speech is normal, no aphasia or dysarthria is noted. Extraocular movements are full. Visual fields are full. Pupils are equal, round, and reactive to light. Discs are flat bilaterally.  Motor: The  patient has good strength in all 4 extremities.  Sensory examination: Soft touch sensation is symmetric on the face, arms, and legs.  Coordination: The patient has good finger-nose-finger and heel-to-shin bilaterally.  Gait and station: The patient has a normal gait. Tandem gait is minimally unsteady. Romberg is negative. No drift is seen.  Reflexes: Deep tendon reflexes are symmetric.   Assessment/Plan:  1. Multiple sclerosis  2. Episodes of near-syncope  3. Recent shingles outbreak, right T4 level  The patient is doing overall relatively well. Her last MRI of the brain was done in October 2014. The patient will have a repeat MRI done. She will have blood work done today to include a CBC. She will continue the Tecfidera for now. She will follow-up in 6 months. I have asked her to check a blood sugar if she has another event of near-syncope, this may represent a vasovagal event, likely not related to the multiple sclerosis.  Jill Alexanders MD 03/10/2015 8:13 PM  [facility] [name] [place], [place] [address]  Phone [phone] Fax [phone]

## 2015-03-11 ENCOUNTER — Encounter: Payer: Self-pay | Admitting: Family Medicine

## 2015-03-11 DIAGNOSIS — R55 Syncope and collapse: Secondary | ICD-10-CM | POA: Insufficient documentation

## 2015-03-11 LAB — BASIC METABOLIC PANEL
BUN/Creatinine Ratio: 19 (ref 11–26)
BUN: 13 mg/dL (ref 8–27)
CO2: 26 mmol/L (ref 18–29)
Calcium: 10.2 mg/dL (ref 8.7–10.3)
Chloride: 95 mmol/L — ABNORMAL LOW (ref 97–108)
Creatinine, Ser: 0.68 mg/dL (ref 0.57–1.00)
GFR calc Af Amer: 110 mL/min/{1.73_m2} (ref >59)
GFR calc non Af Amer: 95 mL/min/{1.73_m2} (ref >59)
Glucose: 168 mg/dL — ABNORMAL HIGH (ref 65–99)
Potassium: 3.4 mmol/L — ABNORMAL LOW (ref 3.5–5.2)
Sodium: 139 mmol/L (ref 134–144)

## 2015-03-11 LAB — CBC WITH DIFFERENTIAL/PLATELET
Basophils Absolute: 0 10*3/uL (ref 0.0–0.2)
Basos: 0 %
EOS (ABSOLUTE): 0.4 10*3/uL (ref 0.0–0.4)
Eos: 6 %
Hematocrit: 41.3 % (ref 34.0–46.6)
Hemoglobin: 13.6 g/dL (ref 11.1–15.9)
Immature Grans (Abs): 0 10*3/uL (ref 0.0–0.1)
Immature Granulocytes: 0 %
Lymphocytes Absolute: 0.7 10*3/uL (ref 0.7–3.1)
Lymphs: 9 %
MCH: 31.8 pg (ref 26.6–33.0)
MCHC: 32.9 g/dL (ref 31.5–35.7)
MCV: 97 fL (ref 79–97)
Monocytes Absolute: 0.5 10*3/uL (ref 0.1–0.9)
Monocytes: 6 %
Neutrophils Absolute: 6.1 10*3/uL (ref 1.4–7.0)
Neutrophils: 79 %
Platelets: 257 10*3/uL (ref 150–379)
RBC: 4.28 x10E6/uL (ref 3.77–5.28)
RDW: 13.4 % (ref 12.3–15.4)
WBC: 7.7 10*3/uL (ref 3.4–10.8)

## 2015-03-18 ENCOUNTER — Encounter: Payer: Self-pay | Admitting: Neurology

## 2015-03-18 ENCOUNTER — Encounter: Payer: Self-pay | Admitting: Family Medicine

## 2015-03-18 ENCOUNTER — Ambulatory Visit (INDEPENDENT_AMBULATORY_CARE_PROVIDER_SITE_OTHER): Payer: 59 | Admitting: Family Medicine

## 2015-03-18 VITALS — BP 131/89 | HR 69 | Temp 98.0°F | Resp 18 | Ht 66.5 in | Wt 189.0 lb

## 2015-03-18 DIAGNOSIS — E782 Mixed hyperlipidemia: Secondary | ICD-10-CM

## 2015-03-18 DIAGNOSIS — E119 Type 2 diabetes mellitus without complications: Secondary | ICD-10-CM

## 2015-03-18 DIAGNOSIS — G35 Multiple sclerosis: Secondary | ICD-10-CM

## 2015-03-18 DIAGNOSIS — I10 Essential (primary) hypertension: Secondary | ICD-10-CM

## 2015-03-18 DIAGNOSIS — E039 Hypothyroidism, unspecified: Secondary | ICD-10-CM

## 2015-03-18 DIAGNOSIS — Z716 Tobacco abuse counseling: Secondary | ICD-10-CM

## 2015-03-18 DIAGNOSIS — I2510 Atherosclerotic heart disease of native coronary artery without angina pectoris: Secondary | ICD-10-CM

## 2015-03-18 DIAGNOSIS — Z0000 Encounter for general adult medical examination without abnormal findings: Secondary | ICD-10-CM

## 2015-03-18 MED ORDER — GLIMEPIRIDE 1 MG PO TABS: 1.0000 mg | ORAL_TABLET | Freq: Every day | ORAL | Status: DC

## 2015-03-18 NOTE — Progress Notes (Signed)
Subjective:    Patient ID: Daisy Bennett, female    DOB: 12/19/1955, 59 y.o.   MRN: [id number]  HPI   Patient presents to office for establishment of care.  Multiple sclerosis: Patient has a history of multiple sclerosis she is followed by neurology closely. She sees Dr. Jannifer Franklin at [facility]. She currently takesTecfidera 240 mg 2 times daily. She has no concerns in her current medication regimen.  Diabetes:  Patient has had diabetes since 2008. Patient is currently prescribed glimepiride and metformin 1000 mg twice a day. She reports her glipizide dose has been changed a few times over the last year, she is now taking 1 mg twice a day. Used to exam completed February 2016.Last few a1c 6.5-->6.1. Patient denies any neuropathy symptoms in hands or feet. Patient denies any nonhealing wounds on feet.  Heart disease/hypertension/high cholesterol: Patient has had high blood pressure since 2002., She had a heart attack with stent placement in 2011. Her medications currently include amlodipine 10 mg daily, Coreg 6.25 mg twice a day, Crestor 10 mg daily, Effient 10 mg daily HCTZ 25 mg, losartan/HCTZ 100-25 milligrams daily and nitroglycerin when necessary for chest pain. She follows with [facility], Dr. Johnsie Cancel. Patient takes a daily 325 mg aspirin. And an iron supplement daily. She takes omega-3 and 6 fish oil 1200 mg daily. She follows regularly with her cardiologist, denies any chest pain, shortness of breath, orthopnea, lower extremity edema, dizziness, syncope or dyspnea on exertion.  Hypothyroid: Patient has had hyperthyroidism since 2010. Patient currently is prescribed levothyroxine 100 g daily. Patient denies any increased fatigue, constipation, dry skin or cold intolerance.  Tobacco abuse: Patient states she used to smoke, and quite successfully. She started back approximately 2 months ago.  Health maintenance:  Colonoscopy: 2012 Mammogram: May  2016 Cervical cancer screening: May 2016, no abnormal Paps, gravida 4 para 2. Immunizations: Tetanus shot up-to-date 2015, Pneumovax shot up-to-date 2009. Zostavax up-to-date 2014. Recent shingles outbreak resolving. Infectious disease screening: unknown  Current everyday smoker (quit once and recently restarted)  Past Medical History  Diagnosis Date  . Multiple sclerosis   . SEMI (subendocardial myocardial infarction) 09/29/09    TOTAL RCA WITH STENT TO OM BRANCH  . Unspecified essential hypertension   . DM (diabetes mellitus)   . Goiter, unspecified   . Unspecified hypothyroidism   . Non-ST elevated myocardial infarction (non-STEMI)   . Shingles   . History of colonic polyps 2012  . Tobacco abuse    Allergies  Allergen Reactions  . Betaseron [Interferon Beta-1b]     Increased LFT's   Past Surgical History  Procedure Laterality Date  . Cataract surgery  12/10  . Vesicovaginal fistula closure w/ tah    . Cesarean section    . Tonsillectomy    . Coronary angioplasty with stent placement     Family History  Problem Relation Age of Onset  . Hypertension Mother   . Heart failure Father     RECENTLY WITHIN THE LAST 2 WKS  . Aneurysm Brother     THORACIC/ABD ANEURYSM  . Prostate cancer Brother 36  . Multiple sclerosis Neg Hx   . Fibromyalgia Sister   . Brain cancer Maternal Grandmother 29   Social History   Social History  . Marital Status: Single    Spouse Name: N/A  . Number of Children: 2  . Years of Education: 14   Occupational History  . Carpenter  History Main Topics  . Smoking status: Current Every Day Smoker -- 1.00 packs/day  . Smokeless tobacco: Never Used     Comment: QUIT MORE THAN 2 YRS. AGO  . Alcohol Use: Yes     Comment: OCCASIONAL ALCOHOL USE.  . Drug Use: No  . Sexual Activity: Yes   Other Topics Concern  . Not on file   Social History Narrative   The patient lives with husband. 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       Patient drinks 2 cups of caffeine daily.   Patient is right handed.   Review of Systems  Constitutional: Negative.  Negative for fever, chills, diaphoresis, activity change, appetite change, fatigue and unexpected weight change.  HENT: Negative.  Negative for facial swelling, hearing loss, sore throat, trouble swallowing and voice change.   Eyes: Negative.   Respiratory: Negative.  Negative for cough, chest tightness, shortness of breath and wheezing.   Cardiovascular: Negative.  Negative for chest pain, palpitations and leg swelling.  Gastrointestinal: Negative.  Negative for nausea, vomiting, abdominal pain, diarrhea, constipation, blood in stool, abdominal distention, anal bleeding and rectal pain.  Endocrine: Negative.  Negative for cold intolerance, heat intolerance, polydipsia, polyphagia and polyuria.  Genitourinary: Negative.  Negative for dysuria, hematuria, flank pain, vaginal bleeding, enuresis, difficulty urinating and pelvic pain.  Musculoskeletal: Positive for myalgias and arthralgias.  Skin: Positive for rash. Negative for color change, pallor and wound.  Allergic/Immunologic: Negative.   Neurological: Positive for dizziness and speech difficulty. Negative for tremors, seizures, syncope, facial asymmetry, weakness, light-headedness, numbness and headaches.  Hematological: Bruises/bleeds easily.  Psychiatric/Behavioral: Negative for suicidal ideas, hallucinations, behavioral problems, confusion, sleep disturbance, self-injury, dysphoric mood, decreased concentration and agitation. The patient is not nervous/anxious and is not hyperactive.        Objective:   Physical Exam BP 131/89 mmHg  Pulse 69  Temp(Src) 98 F (36.7 C) (Temporal)  Resp 18  Ht 5' 6.5" (1.689 m)  Wt 189 lb (85.73 kg)  BMI 30.05 kg/m2  SpO2 98% Gen: NAD. Nontoxic appearance, well-developed, well-nourished, mildly obese, Caucasian female. Pleasant. HEENT: AT.  [place]. Bilateral TM visualized and normal in appearance. MMM. Bilateral nares without erythema or swelling. Throat without erythema or exudates.  EyesPupils Equal Round Reactive to light, Extraocular movements intact, Fundi without hemorrhage or visible lesions, Conjunctiva without redness or discharge CV: RRR, 1/6  systolic appreciated Chest: CTAB, no wheeze or crackles Abd: Soft. Round. NTND. BS present. No Masses palpated.  Ext: No erythema. No edema. +2/4 PT. Skin: No rashes, purpura or petechiae.  Neuro:  Normal gait. PERLA. EOMi. Alert. Oriented. Cranial nerves II through XII intact. No focal deficits. Muscle strength 5/5 bilateral upper and lower extremities. Psych: Normal affect, demeanor and speech. Normal mood, thought content and judgment.     Assessment & Plan:  LANGSTON SUMMERFIELD is a 59 y.o. female presents for new patient establishment. Patient presents to office for establishment of care.  Multiple sclerosis: Continue current regimen and routine follow ups with Neurology.  Diabetes:   Eye  exam completed. Foot exam completed. Possible hypoglycemic events reported and lower A1c by chart review. Patient to monitor diet and blood sugars at home. She will need test strips, but does not know current brand. She will call back in. Will check a1c in 4 weeks (prior to appt) and consider stopping amaryl if <7.0.    Heart disease/hypertension/high cholesterol:  Continue current regimen and routine follow up with  cardiology.  Hypothyroid: Continue  levothyroxine 100 g daily.   Tobacco abuse: Patient states she used to smoke, and quite successfully. She started back approximately 2 months ago. Discussed in great detail the improtnace of smoking cessation, especially with her cardiac condition.   Health maintenance:  Colonoscopy: 2012 Mammogram: May 2016 Cervical cancer screening: May 2016, no abnormal Paps, gravida 4 para 2. Immunizations: Tetanus shot up-to-date 2015, Pneumovax shot  up-to-date 2009. Zostavax up-to-date 2014. Recent shingles outbreak resolving. Infectious disease screening: HIV completed  4 week f/u on diabetes and a1c before rooming.

## 2015-03-18 NOTE — Progress Notes (Signed)
Pre visit review using our clinic review tool, if applicable. No additional management support is needed unless otherwise documented below in the visit note. 

## 2015-03-18 NOTE — Patient Instructions (Signed)

## 2015-03-19 ENCOUNTER — Encounter: Payer: Self-pay | Admitting: Family Medicine

## 2015-03-20 ENCOUNTER — Encounter: Payer: Self-pay | Admitting: Family Medicine

## 2015-03-20 ENCOUNTER — Other Ambulatory Visit: Payer: Self-pay

## 2015-03-20 DIAGNOSIS — Z716 Tobacco abuse counseling: Secondary | ICD-10-CM | POA: Insufficient documentation

## 2015-03-20 DIAGNOSIS — Z0000 Encounter for general adult medical examination without abnormal findings: Secondary | ICD-10-CM | POA: Insufficient documentation

## 2015-03-20 MED ORDER — DIMETHYL FUMARATE 240 MG PO CPDR: 240.0000 mg | DELAYED_RELEASE_CAPSULE | Freq: Two times a day (BID) | ORAL | Status: DC

## 2015-03-26 ENCOUNTER — Ambulatory Visit (INDEPENDENT_AMBULATORY_CARE_PROVIDER_SITE_OTHER): Payer: 59

## 2015-03-26 DIAGNOSIS — G35 Multiple sclerosis: Secondary | ICD-10-CM | POA: Diagnosis not present

## 2015-03-26 MED ORDER — GADOPENTETATE DIMEGLUMINE 469.01 MG/ML IV SOLN: 18.0000 mL | Freq: Once | INTRAVENOUS | Status: DC | PRN

## 2015-03-27 ENCOUNTER — Telehealth: Payer: Self-pay | Admitting: Neurology

## 2015-03-27 NOTE — Telephone Encounter (Signed)
I called the patient. The MRI of the brain was unchanged from 10/14. I discussed this with her.   MRI brain 03/27/2015:  IMPRESSION: This is an abnormal MRI of the brain with and without contrast showing T2/FLAIR hyperintense foci in the hemispheres in a pattern and configuration consistent with multiple sclerosis. There are no acute or enhancing foci. When compared to an MRI of the brain dated 05/10/2013, there is no interim change.

## 2015-04-03 ENCOUNTER — Ambulatory Visit: Payer: 59 | Admitting: Cardiovascular Disease

## 2015-04-16 ENCOUNTER — Encounter: Payer: Self-pay | Admitting: Family Medicine

## 2015-04-16 ENCOUNTER — Ambulatory Visit (INDEPENDENT_AMBULATORY_CARE_PROVIDER_SITE_OTHER): Payer: 59 | Admitting: Family Medicine

## 2015-04-16 VITALS — BP 153/86 | HR 72 | Temp 97.7°F | Resp 18 | Ht 66.5 in | Wt 189.0 lb

## 2015-04-16 DIAGNOSIS — E119 Type 2 diabetes mellitus without complications: Secondary | ICD-10-CM | POA: Diagnosis not present

## 2015-04-16 DIAGNOSIS — Z716 Tobacco abuse counseling: Secondary | ICD-10-CM

## 2015-04-16 DIAGNOSIS — Z23 Encounter for immunization: Secondary | ICD-10-CM

## 2015-04-16 DIAGNOSIS — Z0000 Encounter for general adult medical examination without abnormal findings: Secondary | ICD-10-CM | POA: Diagnosis not present

## 2015-04-16 DIAGNOSIS — I10 Essential (primary) hypertension: Secondary | ICD-10-CM

## 2015-04-16 LAB — POCT GLYCOSYLATED HEMOGLOBIN (HGB A1C): Hemoglobin A1C: 6.2

## 2015-04-16 MED ORDER — GLUCOSE BLOOD VI STRP: ORAL_STRIP | Status: DC

## 2015-04-16 NOTE — Progress Notes (Signed)
Pre visit review using our clinic review tool, if applicable. No additional management support is needed unless otherwise documented below in the visit note. 

## 2015-04-16 NOTE — Progress Notes (Signed)
Subjective:    Patient ID: Daisy Bennett, female    DOB: 07-22-1956, 59 y.o.   MRN: [id number]  HPI Patient presents for diabetes and hypertension follow-up.  Diabetes: Patient presents for scheduled office visit for follow-up on her diabetes. Patient did not bring any diabetic logs, and had not been taking her blood glucose. Patient had had a few hypoglycemic events prior to her first evaluation at this clinic a little over a month ago. At that time we decreased her Amaryl to daily only. Patient states that she had "one incident "around Labor Day where she felt dizzy. She states she was out of town at the time and was not eating her fruit like she usually does. Patient also continues to take metformin 1000 mg twice a day. Foot exam and eye exam completed to share. He denies any nonhealing wounds or numbness and tingling in her extremities.  Smoking cessation: <1 pack a day now, she states she's cut down a little bit at a time and will not be smoking when she sees me next time in December. Patient states she used to smoke, and quite successfully. She started back approximately 3 months ago.    Hypertension: Patient states that her blood pressure is elevated at all of her appointments. Sometimes she is able to calm herself down her blood pressure will be at goal, but at the higher end of her goals. Patient does not add salt to her diet, and states the only thing with a higher level of sodium in her diet is canned soup. She denies any visual changes, chest pain, palpitations or lower extremity edema. She does admit to some dizziness that is suspected to be from hypoglycemia. Patient does not monitor her blood pressures at home. She does exercise daily.  Health maintenance:  Colonoscopy: 2012, 10 year follow-up Mammogram: May 2016 Cervical cancer screening: May 2016, no abnormal Paps, gravida 4 para 2. Pelvics yearly through GYN (hysterectomy). Immunizations: Tetanus shot up-to-date 2015,  Pneumovax shot up-to-date 2009. Zostavax up-to-date 2014. Flu shot needed.  Infectious disease screening: HIV completed  Past Medical History  Diagnosis Date  . Multiple sclerosis   . SEMI (subendocardial myocardial infarction) 09/29/09    TOTAL RCA WITH STENT TO OM BRANCH  . Unspecified essential hypertension   . DM (diabetes mellitus)   . Goiter, unspecified   . Unspecified hypothyroidism   . Non-ST elevated myocardial infarction (non-STEMI)   . Shingles   . History of colonic polyps 2012  . Tobacco abuse    Allergies  Allergen Reactions  . Betaseron [Interferon Beta-1b]     Increased LFT's   Family History  Problem Relation Age of Onset  . Hypertension Mother   . Heart failure Father   . Aneurysm Brother     THORACIC/ABD ANEURYSM  . Prostate cancer Brother 68  . Multiple sclerosis Neg Hx   . Fibromyalgia Sister   . Brain cancer Maternal Grandmother 37   Social: current smoker  Review of Systems Negative, with the exception of above mentioned in HPI    Objective:   Physical Exam BP 153/86 mmHg  Pulse 72  Temp(Src) 97.7 F (36.5 C) (Temporal)  Resp 18  Ht 5' 6.5" (1.689 m)  Wt 189 lb (85.73 kg)  BMI 30.05 kg/m2  SpO2 100% Gen: Afebrile. No acute distress. Nontoxic in appearance, well-developed, well-nourished, obese Caucasian female HENT: WNL Eyes:Pupils Equal Round Reactive to light, Extraocular movements intact Conjunctiva without redness, discharge or icterus. CV: RRR 2/6 SM,  No edema, +2/4 P posterior tibialis pulses Chest: CTAB, no wheeze or crackles Abd: Soft. Obese. NTND. BS Present. No Masses palpated.  Skin: No rashes, purpura or petechiae. Skin intact, no non-healing wounds.      Assessment & Plan:  1. Diabetes type 2, controlled - A1c today 6.2, pt with possible reports of hypoglycemic events. Discontinue Amaryl. Continue metformin 1000 mg twice a day - Goal A1c 6.5-7 - Continue daily exercise and diabetic diet. - Prescription for test strips  given today (hand script and sent one to mail-in pharmacy as well), patient encouraged to check her fasting blood glucose daily and put this in a log book and bring to her next appointment. - POCT glycosylated hemoglobin (Hb A1C)  2. Essential hypertension - Blood pressure not at goal today, patient is to monitor her blood pressure at home 3 times a week and write them down. If her blood pressure is consistently above 140/80 we will need to add another blood pressure medication to her regimen. Patient currently on losartan 100 mg and a total of HCTZ 50 mg. - Continue daily exercise, totaling at least 150 minutes a week. Continue watching low-salt diet.  3. Health care maintenance - Flu Vaccine QUAD 36+ mos IM administered today  4. Tobacco abuse counseling - patient is beginning to slowly taper the amount of cigarettes she smokes daily. Patient has set a date to quit smoking by her appointment in December. Patient counseled extensively on the negative impact her tobacco use is having on her health especially with her cardiac and diabetic history. Discussed with patient, she  may also be having a knee surgery,  and tobacco use hinders proper healing.  - AVS on smoking cessation and 1-800 Quit number/assistance.  - Pt declines further assistance at this time.   Follow-up: 3 months, after December 15. With A1c before rooming.

## 2015-04-16 NOTE — Patient Instructions (Addendum)
Your a1c today 6.2 Discontinue the glimepiride all together , and take your metformin only. Continue to watch your diet and exercise.  - Low salt, low sugar/carb - Continue exercising as tolerated. I hope your knee gets better. - Flu shot given today.  - Take BP 3 x a week, write these down next to your sugars and if consistently above 140 on the top or 80 on the bottom, we will need to add another BP medication.   F/u 3 months (after 07/17/2015) for DM re-check. Sooner if needed or BP not at range. BRING YOUR LOG BOOK    Smoking Cessation Quitting smoking is important to your health and has many advantages. However, it is not always easy to quit since nicotine is a very addictive drug. Oftentimes, people try 3 times or more before being able to quit. This document explains the best ways for you to prepare to quit smoking. Quitting takes hard work and a lot of effort, but you can do it. ADVANTAGES OF QUITTING SMOKING  You will live longer, feel better, and live better.  Your body will feel the impact of quitting smoking almost immediately.  Within 20 minutes, blood pressure decreases. Your pulse returns to its normal level.  After 8 hours, carbon monoxide levels in the blood return to normal. Your oxygen level increases.  After 24 hours, the chance of having a heart attack starts to decrease. Your breath, hair, and body stop smelling like smoke.  After 48 hours, damaged nerve endings begin to recover. Your sense of taste and smell improve.  After 72 hours, the body is virtually free of nicotine. Your bronchial tubes relax and breathing becomes easier.  After 2 to 12 weeks, lungs can hold more air. Exercise becomes easier and circulation improves.  The risk of having a heart attack, stroke, cancer, or lung disease is greatly reduced.  After 1 year, the risk of coronary heart disease is cut in half.  After 5 years, the risk of stroke falls to the same as a nonsmoker.  After 10  years, the risk of lung cancer is cut in half and the risk of other cancers decreases significantly.  After 15 years, the risk of coronary heart disease drops, usually to the level of a nonsmoker.  If you are pregnant, quitting smoking will improve your chances of having a healthy baby.  The people you live with, especially any children, will be healthier.  You will have extra money to spend on things other than cigarettes. QUESTIONS TO THINK ABOUT BEFORE ATTEMPTING TO QUIT You may want to talk about your answers with your health care provider.  Why do you want to quit?  If you tried to quit in the past, what helped and what did not?  What will be the most difficult situations for you after you quit? How will you plan to handle them?  Who can help you through the tough times? Your family? Friends? A health care provider?  What pleasures do you get from smoking? What ways can you still get pleasure if you quit? Here are some questions to ask your health care provider:  How can you help me to be successful at quitting?  What medicine do you think would be best for me and how should I take it?  What should I do if I need more help?  What is smoking withdrawal like? How can I get information on withdrawal? GET READY  Set a quit date.  Change your environment  by getting rid of all cigarettes, ashtrays, matches, and lighters in your home, car, or work. Do not let people smoke in your home.  Review your past attempts to quit. Think about what worked and what did not. GET SUPPORT AND ENCOURAGEMENT You have a better chance of being successful if you have help. You can get support in many ways.  Tell your family, friends, and coworkers that you are going to quit and need their support. Ask them not to smoke around you.  Get individual, group, or telephone counseling and support. Programs are available at [employer] and health centers. Call your local health department for  information about programs in your area.  Spiritual beliefs and practices may help some smokers quit.  Download a "quit meter" on your computer to keep track of quit statistics, such as how long you have gone without smoking, cigarettes not smoked, and money saved.  Get a self-help book about quitting smoking and staying off tobacco. [place] yourself from urges to smoke. Talk to someone, go for a walk, or occupy your time with a task.  Change your normal routine. Take a different route to work. Drink tea instead of coffee. Eat breakfast in a different place.  Reduce your stress. Take a hot bath, exercise, or read a book.  Plan something enjoyable to do every day. Reward yourself for not smoking.  Explore interactive web-based programs that specialize in helping you quit. GET MEDICINE AND USE IT CORRECTLY Medicines can help you stop smoking and decrease the urge to smoke. Combining medicine with the above behavioral methods and support can greatly increase your chances of successfully quitting smoking.  Nicotine replacement therapy helps deliver nicotine to your body without the negative effects and risks of smoking. Nicotine replacement therapy includes nicotine gum, lozenges, inhalers, nasal sprays, and skin patches. Some may be available over-the-counter and others require a prescription.  Antidepressant medicine helps people abstain from smoking, but how this works is unknown. This medicine is available by prescription.  Nicotinic receptor partial agonist medicine simulates the effect of nicotine in your brain. This medicine is available by prescription. Ask your health care provider for advice about which medicines to use and how to use them based on your health history. Your health care provider will tell you what side effects to look out for if you choose to be on a medicine or therapy. Carefully read the information on the package. Do not use any  other product containing nicotine while using a nicotine replacement product.  RELAPSE OR DIFFICULT SITUATIONS Most relapses occur within the first 3 months after quitting. Do not be discouraged if you start smoking again. Remember, most people try several times before finally quitting. You may have symptoms of withdrawal because your body is used to nicotine. You may crave cigarettes, be irritable, feel very hungry, cough often, get headaches, or have difficulty concentrating. The withdrawal symptoms are only temporary. They are strongest when you first quit, but they will go away within 10-14 days. To reduce the chances of relapse, try to:  Avoid drinking alcohol. Drinking lowers your chances of successfully quitting.  Reduce the amount of caffeine you consume. Once you quit smoking, the amount of caffeine in your body increases and can give you symptoms, such as a rapid heartbeat, sweating, and anxiety.  Avoid smokers because they can make you want to smoke.  Do not let weight gain distract you. Many smokers will gain weight when they  quit, usually less than 10 pounds. Eat a healthy diet and stay active. You can always lose the weight gained after you quit.  Find ways to improve your mood other than smoking. FOR MORE INFORMATION  www.smokefree.gov  Document Released: 07/13/2001 Document Revised: 12/03/2013 Document Reviewed: 10/28/2011 [facility] Patient Information 2015 [place], [place]. This information is not intended to replace advice given to you by your health care provider. Make sure you discuss any questions you have with your health care provider.   Please think about quitting smoking.  This is very important for your health.  There are many things we can do to help you quit.  Let  us know if you are interested.  You can also call 1-800-QUIT-NOW 317-187-9236) for free smoking cessation counseling.

## 2015-05-09 ENCOUNTER — Encounter: Payer: Self-pay | Admitting: Neurology

## 2015-05-09 ENCOUNTER — Other Ambulatory Visit: Payer: Self-pay | Admitting: Neurology

## 2015-05-09 ENCOUNTER — Other Ambulatory Visit: Payer: Self-pay | Admitting: Family Medicine

## 2015-05-09 ENCOUNTER — Encounter: Payer: Self-pay | Admitting: Family Medicine

## 2015-05-09 MED ORDER — FENOFIBRATE 160 MG PO TABS: 160.0000 mg | ORAL_TABLET | Freq: Every day | ORAL | Status: DC

## 2015-05-09 MED ORDER — AMITRIPTYLINE HCL 50 MG PO TABS: 50.0000 mg | ORAL_TABLET | Freq: Every day | ORAL | Status: DC

## 2015-05-09 MED ORDER — LEVOTHYROXINE SODIUM 100 MCG PO TABS
100.0000 ug | ORAL_TABLET | Freq: Every day | ORAL | Status: DC
Start: 2015-05-09 — End: 2015-10-14

## 2015-05-09 MED ORDER — GABAPENTIN 600 MG PO TABS: 600.0000 mg | ORAL_TABLET | Freq: Two times a day (BID) | ORAL | Status: DC

## 2015-05-09 MED ORDER — METFORMIN HCL 1000 MG PO TABS: 1000.0000 mg | ORAL_TABLET | Freq: Two times a day (BID) | ORAL | Status: DC

## 2015-05-12 ENCOUNTER — Other Ambulatory Visit: Payer: Self-pay | Admitting: Family Medicine

## 2015-05-12 MED ORDER — METFORMIN HCL 1000 MG PO TABS: 1000.0000 mg | ORAL_TABLET | Freq: Two times a day (BID) | ORAL | Status: DC

## 2015-05-15 ENCOUNTER — Other Ambulatory Visit: Payer: Self-pay

## 2015-05-15 NOTE — Telephone Encounter (Signed)
rosuvastatin (CRESTOR) 10 MG tablet 90 tablet 3 09/18/2014      Sig - Route: Take 1 tablet (10 mg total) by mouth daily. - Oral    E-Prescribing Status: Receipt confirmed by pharmacy (09/18/2014 11:56 AM EST)     Pharmacy    [facility] - [place], [place] - [address]   Medication Detail      Disp Refills Start End     amLODipine (NORVASC) 10 MG tablet 90 tablet 3 09/18/2014     Sig - Route: Take 1 tablet (10 mg total) by mouth daily. - Oral    E-Prescribing Status: Receipt confirmed by pharmacy (09/18/2014 11:56 AM EST)     Pharmacy    [facility] - [place] - [address]

## 2015-05-15 NOTE — Telephone Encounter (Signed)
Medication Detail      Disp Refills Start End     carvedilol (COREG) 6.25 MG tablet 180 tablet 3 09/18/2014     Sig - Route: Take 1 tablet (6.25 mg total) by mouth 2 (two) times daily with a meal. - Oral    E-Prescribing Status: Receipt confirmed by pharmacy (09/18/2014 11:56 AM EST)

## 2015-05-15 NOTE — Telephone Encounter (Signed)
Medication Detail      Disp Refills Start End     losartan-hydrochlorothiazide (HYZAAR) 100-25 MG per tablet 90 tablet 3 09/18/2014     Sig - Route: Take 1 tablet by mouth daily. - Oral    E-Prescribing Status: Receipt confirmed by pharmacy (09/18/2014 11:56 AM EST)

## 2015-05-19 ENCOUNTER — Other Ambulatory Visit: Payer: Self-pay | Admitting: Cardiovascular Disease

## 2015-05-19 ENCOUNTER — Other Ambulatory Visit: Payer: Self-pay

## 2015-05-19 MED ORDER — LOSARTAN POTASSIUM-HCTZ 100-25 MG PO TABS: 1.0000 | ORAL_TABLET | Freq: Every day | ORAL | Status: DC

## 2015-05-19 MED ORDER — AMLODIPINE BESYLATE 10 MG PO TABS: 10.0000 mg | ORAL_TABLET | Freq: Every day | ORAL | Status: DC

## 2015-05-19 MED ORDER — CARVEDILOL 6.25 MG PO TABS: 6.2500 mg | ORAL_TABLET | Freq: Two times a day (BID) | ORAL | Status: DC

## 2015-05-19 MED ORDER — ROSUVASTATIN CALCIUM 10 MG PO TABS: 10.0000 mg | ORAL_TABLET | Freq: Every day | ORAL | Status: DC

## 2015-06-08 NOTE — Progress Notes (Signed)
Patient ID: Daisy Bennett, female   DOB: October 24, 1955, 59 y.o.   MRN: [id number]   Daisy Bennett is seen f/u CAD  post stenting 2011  She had a non ST elevation MI with subsequent stenting of an OM. She has diffuse 3VD with a total right that is collateralized. She had smoewhat atypical presentation with her DM. [name] She has a benign systolic murmur and does not need SBE No chest pain. [place] more. Has LE edema that seems to be getting worse    Labs 9/15   Remarkable for Ht 32 and Cr .75  LDL 45 TC 123 normal LFTs  She is walking 2 miles / day She started smoking again in January Counseled for less than 10 minutes on cessation I suspect she can quit as she had been off for 6 years  Did run first 5 K with her dog  Had shingles on right side of her back over to her breast  Had shingles shot  ROS: Denies fever, malais, weight loss, blurry vision, decreased visual acuity, cough, sputum, SOB, hemoptysis, pleuritic pain, palpitaitons, heartburn, abdominal pain, melena, lower extremity edema, claudication, or rash.  All other systems reviewed and negative  General: Affect appropriate Healthy:  appears stated age 38: normal Neck supple with no adenopathy JVP normal no bruits no thyromegaly Lungs clear with no wheezing and good diaphragmatic motion Heart:  S1/S2 1/6 SEM  murmur, no rub, gallop or click PMI normal Abdomen: benighn, BS positve, no tenderness, no AAA no bruit.  No HSM or HJR Distal pulses intact with no bruits No edema Neuro non-focal Skin warm and dry No muscular weakness   Current Outpatient Prescriptions  Medication Sig Dispense Refill  . amitriptyline (ELAVIL) 50 MG tablet Take 1 tablet (50 mg total) by mouth daily. 90 tablet 1  . amLODipine (NORVASC) 10 MG tablet Take 1 tablet (10 mg total) by mouth daily. 90 tablet 3  . aspirin EC 325 MG EC tablet Take 325 mg by mouth at bedtime.     . carvedilol (COREG) 6.25 MG tablet Take 1 tablet (6.25 mg total) by mouth 2 (two) times daily  with a meal. 180 tablet 1  . CINNAMON PO Take 2,000 mg by mouth daily.     . Dimethyl Fumarate (TECFIDERA) 240 MG CPDR Take 1 capsule (240 mg total) by mouth 2 (two) times daily. 60 capsule 6  . esomeprazole (NEXIUM) 20 MG capsule Take 20 mg by mouth daily at 12 noon.    . fenofibrate 160 MG tablet Take 1 tablet (160 mg total) by mouth daily. 90 tablet 0  . Ferrous Sulfate (IRON) 325 (65 FE) MG TABS Take 1 tablet by mouth daily.      . fish oil-omega-3 fatty acids 1000 MG capsule Take 1-2 g by mouth daily.     [name] gabapentin (NEURONTIN) 600 MG tablet Take 1 tablet (600 mg total) by mouth 2 (two) times daily. 180 tablet 3  . glucose blood test strip DX:E11.9, monitor fasting BG daily. 100 each 11  . hydrochlorothiazide (HYDRODIURIL) 25 MG tablet Take 25 mg by mouth daily.    [name] levothyroxine (SYNTHROID, LEVOTHROID) 100 MCG tablet Take 1 tablet (100 mcg total) by mouth daily. 90 tablet 0  . losartan-hydrochlorothiazide (HYZAAR) 100-25 MG tablet Take 1 tablet by mouth daily. 90 tablet 3  . metFORMIN (GLUCOPHAGE) 1000 MG tablet Take 1 tablet (1,000 mg total) by mouth 2 (two) times daily with a meal. 60 tablet 5  . Multiple Vitamin (MULTIVITAMINS  PO) Take 1 tablet by mouth at bedtime. daily    . nitroGLYCERIN (NITROSTAT) 0.4 MG SL tablet Place 1 tablet (0.4 mg total) under the tongue every 5 (five) minutes as needed for chest pain. 25 tablet 4  . prasugrel (EFFIENT) 10 MG TABS tablet Take 1 tablet (10 mg total) by mouth daily. 90 tablet 3  . rosuvastatin (CRESTOR) 10 MG tablet Take 1 tablet (10 mg total) by mouth daily. 90 tablet 3  . venlafaxine (EFFEXOR-XR) 75 MG 24 hr capsule Take 75 mg by mouth daily.       No current facility-administered medications for this visit.   Facility-Administered Medications Ordered in Other Visits  Medication Dose Route Frequency Provider Last Rate Last Dose  . gadopentetate dimeglumine (MAGNEVIST) injection 18 mL  18 mL Intravenous Once PRN Kathrynn Ducking, MD         Allergies  Betaseron  Electrocardiogram:  03/14/13   SR normal rate 64   SR rate 67  Normal no change 09/18/14   Assessment and Plan CAD: Collateralized RCA and stent to OM in 2011 no angina continue medical Rx  Has nitro HTN: Well controlled.  Continue current medications and low sodium Dash type diet.   DM:  F/u primary meds being adjusted   Lab Results  Component Value Date   HGBA1C 6.2 04/16/2015    MS:  Stable continue activity Chol: continue statin  Smoking  Counseled for less than 10 minutes regarding cessation and risk of progressive vascular disease  Jenkins [place]

## 2015-06-09 ENCOUNTER — Other Ambulatory Visit: Payer: Self-pay | Admitting: *Deleted

## 2015-06-09 MED ORDER — METFORMIN HCL 1000 MG PO TABS: 1000.0000 mg | ORAL_TABLET | Freq: Two times a day (BID) | ORAL | Status: DC

## 2015-06-09 NOTE — Telephone Encounter (Signed)
Metformin refilled per refill protocol

## 2015-06-10 ENCOUNTER — Encounter: Payer: Self-pay | Admitting: Cardiovascular Disease

## 2015-06-10 ENCOUNTER — Ambulatory Visit (INDEPENDENT_AMBULATORY_CARE_PROVIDER_SITE_OTHER): Payer: 59 | Admitting: Cardiovascular Disease

## 2015-06-10 VITALS — BP 130/74 | HR 77 | Ht 66.0 in | Wt 191.4 lb

## 2015-06-10 DIAGNOSIS — I2510 Atherosclerotic heart disease of native coronary artery without angina pectoris: Secondary | ICD-10-CM

## 2015-06-10 DIAGNOSIS — I2583 Coronary atherosclerosis due to lipid rich plaque: Principal | ICD-10-CM

## 2015-06-10 NOTE — Patient Instructions (Signed)

## 2015-06-11 ENCOUNTER — Other Ambulatory Visit: Payer: Self-pay | Admitting: *Deleted

## 2015-06-11 ENCOUNTER — Other Ambulatory Visit: Payer: Self-pay

## 2015-06-11 MED ORDER — CARVEDILOL 6.25 MG PO TABS: 6.2500 mg | ORAL_TABLET | Freq: Two times a day (BID) | ORAL | Status: DC

## 2015-06-11 MED ORDER — METFORMIN HCL 1000 MG PO TABS: 1000.0000 mg | ORAL_TABLET | Freq: Two times a day (BID) | ORAL | Status: DC

## 2015-06-11 NOTE — Telephone Encounter (Signed)
Josue Hector, MD at 06/08/2015 2:31 PM  carvedilol (COREG) 6.25 MG tabletTake 1 tablet (6.25 mg total) by mouth 2 (two) times daily with a meal losartan-hydrochlorothiazide (HYZAAR) 100-25 MG tabletTake 1 tablet by mouth daily Patient Instructions     Medication Instructions:  Your physician recommends that you continue on your current medications as directed. Please refer to the Current Medication list given to you today.

## 2015-06-17 ENCOUNTER — Other Ambulatory Visit: Payer: Self-pay | Admitting: *Deleted

## 2015-06-17 MED ORDER — LOSARTAN POTASSIUM-HCTZ 100-25 MG PO TABS: 1.0000 | ORAL_TABLET | Freq: Every day | ORAL | Status: DC

## 2015-06-17 MED ORDER — CARVEDILOL 6.25 MG PO TABS: 6.2500 mg | ORAL_TABLET | Freq: Two times a day (BID) | ORAL | Status: DC

## 2015-07-22 ENCOUNTER — Ambulatory Visit (INDEPENDENT_AMBULATORY_CARE_PROVIDER_SITE_OTHER): Payer: 59 | Admitting: Family Medicine

## 2015-07-22 ENCOUNTER — Encounter: Payer: Self-pay | Admitting: Family Medicine

## 2015-07-22 VITALS — BP 143/85 | HR 65 | Temp 98.1°F | Resp 20 | Wt 192.8 lb

## 2015-07-22 DIAGNOSIS — Z716 Tobacco abuse counseling: Secondary | ICD-10-CM

## 2015-07-22 DIAGNOSIS — R5383 Other fatigue: Secondary | ICD-10-CM | POA: Diagnosis not present

## 2015-07-22 DIAGNOSIS — E119 Type 2 diabetes mellitus without complications: Secondary | ICD-10-CM | POA: Diagnosis not present

## 2015-07-22 LAB — VITAMIN B12: Vitamin B-12: 682 pg/mL (ref 211–911)

## 2015-07-22 LAB — VITAMIN D 25 HYDROXY (VIT D DEFICIENCY, FRACTURES): VITD: 33.17 ng/mL (ref 30.00–100.00)

## 2015-07-22 LAB — POCT GLYCOSYLATED HEMOGLOBIN (HGB A1C): Hemoglobin A1C: 6.4

## 2015-07-22 NOTE — Patient Instructions (Signed)
Seasonal Affective Disorder  Seasonal affective disorder (SAD) is a form of depression. It is when you feel sad, down, or blue at specific times of the year. The most common time of year for this is late fall and winter, when the days are shorter and most people spend less time outdoors. This is why SAD is also known as the "winter blues." SAD occurs less commonly in the spring or summer.    SAD can vary in severity and can interfere with work, school, relationships, and normal daily activities.  RISK FACTORS  This condition is more common in:  · Young women.  · People who have a history of clinical depression or bipolar disorder.  · People who live far north or far south of the equator.  SYMPTOMS  Symptoms of this condition include:  · Depressed mood, such as:    Feeling sad, down, blue, or teary.    Having crying spells.  · Irritability.  · Trouble sleeping or sleeping more than usual.  · Loss of interest in activities that you usually enjoy.  · Feelings of guilt or worthlessness.  · Restlessness or loss of energy.  · Difficulty concentrating, remembering, or making decisions.  · Significant change in appetite or weight.  · Recurrent wishes for death, recurrent thoughts of self-harm, or an attempt at suicide.  DIAGNOSIS  Diagnosis of this condition is usually made through an assessment by your health care provider. You will be asked about your moods, thoughts, and behaviors. You will also be asked about your medical history, any major life changes, medicines, and substance use. A physical exam and lab work may be done to make sure there is no other cause for your depression. You may be referred to a mental health specialist for further evaluation.   TREATMENT  Treatment for this condition may include:  · Light therapy. Light therapy involves sitting in front of a light source for 15-30 minutes every day. It is thought to work by increasing the duration of daylight that is sensed by the brain.  · Antidepressant  medicine.  · Cognitive behavioral therapy (CBT). CBT is a form of talk therapy that helps to identify and change negative thoughts that are associated with SAD.  HOME CARE INSTRUCTIONS  · Take over-the-counter and prescription medicines only as told by your healthcare provider. This is important.  · Check with your health care provider before you start taking any new prescriptions, over-the-counter medicines, herbs, or supplements.  · Keep all follow-up visits as told by your health care provider. This is important.  · Maintain a healthy lifestyle.    Eat healthy foods.    Get plenty of sleep.    Exercise regularly.    Do not drink alcohol.    Do not use recreational drugs.  · Make your home and work environment as sunny or bright as possible. Open blinds and spend as much time as possible outside.  SEEK MEDICAL CARE IF:  · Your medicines do not seem to be helping.  · Your symptoms do not improve or they get worse.  · You have trouble taking care of yourself.  · You experience side effects of medicines, such as changes in sleep patterns, dizziness, drowsiness, weight gain, restlessness, movement changes, or tremors.  SEEK IMMEDIATE MEDICAL CARE IF:  · You have serious thoughts about hurting yourself or others.  · You experience serious side effects of medicine, such as:    Swelling of your face, lips, tongue, or throat.    

## 2015-07-22 NOTE — Progress Notes (Signed)
   Subjective:    Patient ID: Daisy Bennett, female    DOB: [birth year], 59 y.o.   MRN: [id number]  HPI  Diabetes: Patient presents for routine scheduled diabetes follow-up. Last A1c was 6.2, at that time patient was having hypoglycemic episodes. We discontinued her Amaryl, and she was asked to monitor her blood sugars at least a few times a week fasting. Patient brings with her fasting blood sugar numbers that range between 99- 148. She also has recorded blood pressure parameters, all of which are normal. Patient denies any dizziness, states it has completely resolved. She continues to take the metformin 1000 mg twice a day. Her last A1c was 6.2.   Tobacco dependence: Patient had set a goal on her prior appointment to have quit all tobacco products by December. She states she has not done so yet, but she has cut back to less than a pack a day. Patient is considering acupuncture as possible help with her smoking cessation. She states her friend recently had acupuncture completed, and she has the name of a local acupuncturist. She has not made an appointment with his person yet Current every day smoker Past Medical History  Diagnosis Date  . Multiple sclerosis ([place])   . SEMI (subendocardial myocardial infarction) ([place]) 09/29/09    TOTAL RCA WITH STENT TO OM BRANCH  . Unspecified essential hypertension   . DM (diabetes mellitus) ([place])   . Goiter, unspecified   . Unspecified hypothyroidism   . Non-ST elevated myocardial infarction (non-STEMI) ([place])   . Shingles   . History of colonic polyps 2012  . Tobacco abuse    Allergies  Allergen Reactions  . Betaseron [Interferon Beta-1b]     Increased LFT's    Review of Systems Negative, with the exception of above mentioned in HPI     Objective:   Physical Exam BP 143/85 mmHg  Pulse 65  Temp(Src) 98.1 F (36.7 C) (Oral)  Resp 20  Wt 192 lb 12 oz (87.431 kg)  SpO2 98% Gen: Afebrile. No acute distress. Nontoxic in appearance,  well-developed, well-nourished, Caucasian female. Very pleasant. HENT: AT. [place]. Bilateral TM visualized and normal in appearance. MMM, no oral lesions.  Eyes:Pupils Equal Round Reactive to light, Extraocular movements intact,  Conjunctiva without redness, discharge or icterus. CV: RRR , no edema, +2/4 P posterior tibialis pulses Chest: CTAB, no wheeze or crackles Skin: No rashes, purpura or petechiae.  Neuro:  Normal gait. PERLA. EOMi. Alert. Oriented x3  Psych: Normal affect, dress and demeanor. Normal speech. Normal thought content and judgment.     Assessment & Plan:  1. Diabetes mellitus without complication ([place]) - POCT HgB A1C--> 6.4, goal for her is approximately 6.5-7. Patient has hypoglycemic episodes. - Hypoglycemic episodes have resolved.  2. Tobacco abuse counseling - Patient considering acupuncture. Discussed with her cutting back on her cigarettes at least weekly. In making the appointment for acupuncture today.  4. Other fatigue - Labs repeat vitamin D collected today, if insufficient or deficient - B12 - Vitamin D (25 hydroxy)  Three-month follow-up for diabetes

## 2015-07-23 ENCOUNTER — Telehealth: Payer: Self-pay | Admitting: Family Medicine

## 2015-07-23 NOTE — Telephone Encounter (Signed)
Spoke with patient reviewed lab results and instructions. Patient verbalized understanding. 

## 2015-07-23 NOTE — Telephone Encounter (Signed)
Please call pt: - Her B12 level is excellent.  - Her Vitamin D is low normal. Lower levels of Vit d can cause some of the symptoms she is experiencing.  - I would encourage her to take 1000 u OTC supplementation daily. She should take this for bone health as well, regardless of summer or winter seasons.

## 2015-08-03 DIAGNOSIS — I619 Nontraumatic intracerebral hemorrhage, unspecified: Secondary | ICD-10-CM

## 2015-08-03 HISTORY — DX: Nontraumatic intracerebral hemorrhage, unspecified: I61.9

## 2015-09-11 ENCOUNTER — Encounter: Payer: Self-pay | Admitting: Adult Health

## 2015-09-11 ENCOUNTER — Ambulatory Visit (INDEPENDENT_AMBULATORY_CARE_PROVIDER_SITE_OTHER): Payer: 59 | Admitting: Adult Health

## 2015-09-11 VITALS — BP 135/74 | HR 63 | Ht 66.0 in | Wt 192.0 lb

## 2015-09-11 DIAGNOSIS — Z5181 Encounter for therapeutic drug level monitoring: Secondary | ICD-10-CM

## 2015-09-11 DIAGNOSIS — G35 Multiple sclerosis: Secondary | ICD-10-CM

## 2015-09-11 NOTE — Progress Notes (Signed)
I have read the note, and I agree with the clinical assessment and plan.  Leinaala Catanese KEITH   

## 2015-09-11 NOTE — Patient Instructions (Signed)
Continue Tecfidera Blood work today If your symptoms worsen or you develop new symptoms please let us know.   

## 2015-09-11 NOTE — Progress Notes (Signed)
PATIENT: Daisy Bennett DOB: [birth year]  REASON FOR VISIT: follow up- multiple sclerosis HISTORY FROM: patient  HISTORY OF PRESENT ILLNESS: Daisy Bennett is a 60 year old female with a history of multiple sclerosis. She returns today for follow-up. The patient is currently taking Tecfidera and tolerating it well. She denies any new neurological symptoms. She states that she does not have any new numbness or weakness. No changes with the bowels or bladder. She states that she has issues with her balance but no significant changes. No changes with her gait. No changes with the vision. She states that the syncopal episodes that was described at the last visit was due to low blood sugar. Overall she feels that she is doing well. She returns today for an evaluation.  HISTORY 03/10/15: Daisy Bennett is a 60 year old right-handed white female with a history of multiple sclerosis that was diagnosed in 1996. Initially, the patient was followed through [facility], she was placed on Betaseron, and was on this medication for a number of years. In October 2013, the patient went off of Betaseron in part because she did not tolerate the subcutaneous injections, and in part secondary to liver enzyme elevations from the Betaseron. The patient indicated that her diagnosis occurred after she lost hearing in the left ear, MRI evaluation showed brain lesions, and eventually she developed numbness on the left lower body including the left leg associated with a gait disorder. She never had lumbar puncture done, she was started on medications shortly thereafter. The patient has done relatively well without significant progression of her multiple sclerosis. The patient has diabetes, she denies any significant numbness or weakness of the extremities, she does have chronic gait instability, she will fall on occasion, but she does not use a cane for ambulation. She denies any issues controlling the bowels or the bladder. She  denies any visual field changes, dizziness, double vision, or weakness. She recently had blood work done in March 2016 that included a liver panel that did not show a liver enzyme elevation. She has not had a CBC in greater than one year. She did sustain a bout of shingles on the right T4 level. She has had 2 episodes of near-syncope, one before the outbreak of shingles, one afterwards. The patient did not check her blood sugar during the episodes that left her with fatigue afterwards. She denies any palpitations of the heart. She does have a history of heart disease. She comes to this office for an evaluation.  REVIEW OF SYSTEMS: Out of a complete 14 system review of symptoms, the patient complains only of the following symptoms, and all other reviewed systems are negative.  See history of present illness  ALLERGIES: Allergies  Allergen Reactions  . Betaseron [Interferon Beta-1b]     Increased LFT's    HOME MEDICATIONS: Outpatient Prescriptions Prior to Visit  Medication Sig Dispense Refill  . amitriptyline (ELAVIL) 50 MG tablet Take 1 tablet (50 mg total) by mouth daily. 90 tablet 1  . amLODipine (NORVASC) 10 MG tablet Take 1 tablet (10 mg total) by mouth daily. 90 tablet 3  . aspirin EC 325 MG EC tablet Take 325 mg by mouth at bedtime.     . carvedilol (COREG) 6.25 MG tablet Take 1 tablet (6.25 mg total) by mouth 2 (two) times daily with a meal. 180 tablet 3  . CINNAMON PO Take 2,000 mg by mouth daily.     . Dimethyl Fumarate (TECFIDERA) 240 MG CPDR Take 1 capsule (240  mg total) by mouth 2 (two) times daily. 60 capsule 6  . esomeprazole (NEXIUM) 20 MG capsule Take 20 mg by mouth daily at 12 noon.    . fenofibrate 160 MG tablet Take 1 tablet (160 mg total) by mouth daily. 90 tablet 0  . Ferrous Sulfate (IRON) 325 (65 FE) MG TABS Take 1 tablet by mouth daily.      . fish oil-omega-3 fatty acids 1000 MG capsule Take 1-2 g by mouth daily.     [name] gabapentin (NEURONTIN) 600 MG tablet Take 1  tablet (600 mg total) by mouth 2 (two) times daily. 180 tablet 3  . glucose blood test strip DX:E11.9, monitor fasting BG daily. 100 each 11  . hydrochlorothiazide (HYDRODIURIL) 25 MG tablet Take 25 mg by mouth daily.    [name] levothyroxine (SYNTHROID, LEVOTHROID) 100 MCG tablet Take 1 tablet (100 mcg total) by mouth daily. 90 tablet 0  . losartan-hydrochlorothiazide (HYZAAR) 100-25 MG tablet Take 1 tablet by mouth daily. 90 tablet 3  . metFORMIN (GLUCOPHAGE) 1000 MG tablet Take 1 tablet (1,000 mg total) by mouth 2 (two) times daily with a meal. 60 tablet 5  . Multiple Vitamin (MULTIVITAMINS PO) Take 1 tablet by mouth at bedtime. daily    . nitroGLYCERIN (NITROSTAT) 0.4 MG SL tablet Place 1 tablet (0.4 mg total) under the tongue every 5 (five) minutes as needed for chest pain. 25 tablet 4  . prasugrel (EFFIENT) 10 MG TABS tablet Take 1 tablet (10 mg total) by mouth daily. 90 tablet 3  . rosuvastatin (CRESTOR) 10 MG tablet Take 1 tablet (10 mg total) by mouth daily. 90 tablet 3  . venlafaxine (EFFEXOR-XR) 75 MG 24 hr capsule Take 75 mg by mouth daily.       Facility-Administered Medications Prior to Visit  Medication Dose Route Frequency Provider Last Rate Last Dose  . gadopentetate dimeglumine (MAGNEVIST) injection 18 mL  18 mL Intravenous Once PRN Kathrynn Ducking, MD        PAST MEDICAL HISTORY: Past Medical History  Diagnosis Date  . Multiple sclerosis ([place])   . SEMI (subendocardial myocardial infarction) ([place]) 09/29/09    TOTAL RCA WITH STENT TO OM BRANCH  . Unspecified essential hypertension   . DM (diabetes mellitus) ([place])   . Goiter, unspecified   . Unspecified hypothyroidism   . Non-ST elevated myocardial infarction (non-STEMI) ([place])   . Shingles   . History of colonic polyps 2012  . Tobacco abuse     PAST SURGICAL HISTORY: Past Surgical History  Procedure Laterality Date  . Cataract surgery  12/10  . Vesicovaginal fistula closure w/ tah    . Cesarean section    .  Tonsillectomy    . Coronary angioplasty with stent placement      FAMILY HISTORY: Family History  Problem Relation Age of Onset  . Hypertension Mother   . Heart failure Father   . Aneurysm Brother     THORACIC/ABD ANEURYSM  . Prostate cancer Brother 24  . Multiple sclerosis Neg Hx   . Fibromyalgia Sister   . Brain cancer Maternal Grandmother 70    SOCIAL HISTORY: Social History   Social History  . Marital Status: Single    Spouse Name: N/A  . Number of Children: 2  . Years of Education: 14   Occupational History  . [employer]    Social History Main Topics  . Smoking status: Current Every Day Smoker -- 1.00 packs/day  . Smokeless tobacco: Never Used  .  Alcohol Use: 0.0 oz/week    0 Standard drinks or equivalent per week     Comment: OCCASIONAL ALCOHOL USE.  . Drug Use: No  . Sexual Activity: Yes   Other Topics Concern  . Not on file   Social History Narrative   The patient lives with husband. 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    Patient drinks occasionally, does not chew tobacco, does not use recreational drugs. She does drink occasional caffeine. She does wear her seatbelt. She does wear bike helmet riding a bike. She does exercise 3 times a week. She does not wear hearing aids or dentures. There is a smoke alarm in her home. There are no firearms in her home. She feels safe in her relationship. She has never experienced physical abuse.   Patient sleeps 7-8 hours a night.   Patient drinks 2 cups of caffeine daily.   Patient is right handed.      PHYSICAL EXAM  Filed Vitals:   09/11/15 0920  BP: 135/74  Pulse: 63  Height: 5\' 6"  (1.676 m)  Weight: 192 lb (87.091 kg)   Body mass index is 31 kg/(m^2).  Generalized: Well developed, in no acute distress   Neurological examination  Mentation: Alert oriented to time, place, history taking. Follows all commands speech and language fluent Cranial nerve II-XII:  Pupils were equal round reactive to light. Extraocular movements were full, visual field were full on confrontational test. Facial sensation and strength were normal. Uvula tongue midline. Head turning and shoulder shrug  were normal and symmetric. Motor: The motor testing reveals 5 over 5 strength of all 4 extremities. Good symmetric motor tone is noted throughout.  Sensory: Sensory testing is intact to soft touch on all 4 extremities. No evidence of extinction is noted.  Coordination: Cerebellar testing reveals good finger-nose-finger and heel-to-shin bilaterally.  Gait and station: Gait is normal. Tandem gait is unsteady. Romberg is negative. But the patient does sway. No drift is seen.  Reflexes: Deep tendon reflexes are symmetric and normal bilaterally.   DIAGNOSTIC DATA (LABS, IMAGING, TESTING) - I reviewed patient records, labs, notes, testing and imaging myself where available.  Lab Results  Component Value Date   WBC 7.7 03/10/2015   HGB 11.2* 12/26/2013   HCT 41.3 03/10/2015   MCV 97 03/10/2015   PLT 257 03/10/2015      Component Value Date/Time   NA 139 03/10/2015 1247   NA 137 12/26/2013 2015   K 3.4* 03/10/2015 1247   CL 95* 03/10/2015 1247   CO2 26 03/10/2015 1247   GLUCOSE 168* 03/10/2015 1247   GLUCOSE 125* 12/26/2013 2015   BUN 13 03/10/2015 1247   BUN 15 12/26/2013 2015   CREATININE 0.68 03/10/2015 1247   CALCIUM 10.2 03/10/2015 1247   PROT 6.6 09/30/2009 0540   ALBUMIN 3.6 09/30/2009 0540   AST 47* 09/30/2009 0540   ALT 43* 09/30/2009 0540   ALKPHOS 88 09/30/2009 0540   BILITOT 0.4 09/30/2009 0540   GFRNONAA 95 03/10/2015 1247   GFRAA 110 03/10/2015 1247    Lab Results  Component Value Date   HGBA1C 6.4 07/22/2015   Lab Results  Component Value Date   VITAMINB12 682 07/22/2015       ASSESSMENT AND PLAN 60 y.o. year old female  has a past medical history of Multiple sclerosis ([place]); SEMI (subendocardial myocardial infarction) ([place]) (09/29/09);  Unspecified essential hypertension; DM (diabetes mellitus) ([place]); Goiter, unspecified; Unspecified hypothyroidism; Non-ST elevated myocardial  infarction (non-STEMI) ([place]); Shingles; History of colonic polyps (2012); and Tobacco abuse. here with:  1. Multiple sclerosis  Overall the patient is doing well. She will continue on Tecfidera. I will check blood work today. Patient was advised that if her symptoms worsen or she develops any new symptoms she should let us know. She will follow-up in 6 months or sooner if needed.     Ward Givens, MSN, NP-C 09/11/2015, 10:01 AM [facility] [address] [place], [place] [address] [phone]

## 2015-09-12 LAB — CBC WITH DIFFERENTIAL/PLATELET
Basophils Absolute: 0 10*3/uL (ref 0.0–0.2)
Basos: 0 %
EOS (ABSOLUTE): 0.2 10*3/uL (ref 0.0–0.4)
Eos: 2 %
Hematocrit: 39 % (ref 34.0–46.6)
Hemoglobin: 13.2 g/dL (ref 11.1–15.9)
Immature Grans (Abs): 0 10*3/uL (ref 0.0–0.1)
Immature Granulocytes: 0 %
Lymphocytes Absolute: 0.7 10*3/uL (ref 0.7–3.1)
Lymphs: 7 %
MCH: 32 pg (ref 26.6–33.0)
MCHC: 33.8 g/dL (ref 31.5–35.7)
MCV: 95 fL (ref 79–97)
Monocytes Absolute: 0.5 10*3/uL (ref 0.1–0.9)
Monocytes: 5 %
Neutrophils Absolute: 8.9 10*3/uL — ABNORMAL HIGH (ref 1.4–7.0)
Neutrophils: 86 %
Platelets: 243 10*3/uL (ref 150–379)
RBC: 4.12 x10E6/uL (ref 3.77–5.28)
RDW: 13.5 % (ref 12.3–15.4)
WBC: 10.3 10*3/uL (ref 3.4–10.8)

## 2015-09-12 LAB — COMPREHENSIVE METABOLIC PANEL
ALT: 15 IU/L (ref 0–32)
AST: 12 IU/L (ref 0–40)
Albumin/Globulin Ratio: 1.8 (ref 1.1–2.5)
Albumin: 4.4 g/dL (ref 3.6–4.8)
Alkaline Phosphatase: 79 IU/L (ref 39–117)
BUN/Creatinine Ratio: 19 (ref 11–26)
BUN: 14 mg/dL (ref 8–27)
Bilirubin Total: <0.2 mg/dL (ref 0.0–1.2)
CO2: 28 mmol/L (ref 18–29)
Calcium: 9.9 mg/dL (ref 8.7–10.3)
Chloride: 95 mmol/L — ABNORMAL LOW (ref 96–106)
Creatinine, Ser: 0.75 mg/dL (ref 0.57–1.00)
GFR calc Af Amer: 99 mL/min/{1.73_m2} (ref >59)
GFR calc non Af Amer: 86 mL/min/{1.73_m2} (ref >59)
Globulin, Total: 2.4 g/dL (ref 1.5–4.5)
Glucose: 134 mg/dL — ABNORMAL HIGH (ref 65–99)
Potassium: 4.3 mmol/L (ref 3.5–5.2)
Sodium: 139 mmol/L (ref 134–144)
Total Protein: 6.8 g/dL (ref 6.0–8.5)

## 2015-09-15 ENCOUNTER — Telehealth: Payer: Self-pay | Admitting: Adult Health

## 2015-09-15 NOTE — Telephone Encounter (Signed)
Called and relayed to patient labs were ok. Patient understood.

## 2015-09-15 NOTE — Telephone Encounter (Signed)
-----   Message from Ward Givens, NP sent at 09/15/2015  7:43 AM EST ----- Lab work ok. Please cal the patient.

## 2015-09-18 ENCOUNTER — Other Ambulatory Visit: Payer: Self-pay

## 2015-09-18 MED ORDER — DIMETHYL FUMARATE 240 MG PO CPDR: 240.0000 mg | DELAYED_RELEASE_CAPSULE | Freq: Two times a day (BID) | ORAL | Status: DC

## 2015-09-22 LAB — HM DIABETES EYE EXAM

## 2015-10-14 ENCOUNTER — Ambulatory Visit (INDEPENDENT_AMBULATORY_CARE_PROVIDER_SITE_OTHER): Payer: 59 | Admitting: Family Medicine

## 2015-10-14 ENCOUNTER — Other Ambulatory Visit (INDEPENDENT_AMBULATORY_CARE_PROVIDER_SITE_OTHER): Payer: 59

## 2015-10-14 ENCOUNTER — Telehealth: Payer: Self-pay | Admitting: Family Medicine

## 2015-10-14 ENCOUNTER — Encounter: Payer: Self-pay | Admitting: Family Medicine

## 2015-10-14 VITALS — BP 148/84 | HR 68 | Temp 97.8°F | Resp 20 | Wt 192.5 lb

## 2015-10-14 DIAGNOSIS — E119 Type 2 diabetes mellitus without complications: Secondary | ICD-10-CM | POA: Diagnosis not present

## 2015-10-14 DIAGNOSIS — Z1159 Encounter for screening for other viral diseases: Secondary | ICD-10-CM | POA: Insufficient documentation

## 2015-10-14 DIAGNOSIS — L089 Local infection of the skin and subcutaneous tissue, unspecified: Secondary | ICD-10-CM | POA: Diagnosis not present

## 2015-10-14 DIAGNOSIS — E039 Hypothyroidism, unspecified: Secondary | ICD-10-CM

## 2015-10-14 DIAGNOSIS — I10 Essential (primary) hypertension: Secondary | ICD-10-CM

## 2015-10-14 DIAGNOSIS — Z23 Encounter for immunization: Secondary | ICD-10-CM | POA: Diagnosis not present

## 2015-10-14 LAB — POCT GLYCOSYLATED HEMOGLOBIN (HGB A1C): Hemoglobin A1C: 7.1

## 2015-10-14 LAB — TSH: TSH: 1.78 u[IU]/mL (ref 0.35–4.50)

## 2015-10-14 MED ORDER — MUPIROCIN CALCIUM 2 % NA OINT: TOPICAL_OINTMENT | NASAL | Status: DC

## 2015-10-14 MED ORDER — LEVOTHYROXINE SODIUM 100 MCG PO TABS: 100.0000 ug | ORAL_TABLET | Freq: Every day | ORAL | Status: DC

## 2015-10-14 MED ORDER — FLUCONAZOLE 150 MG PO TABS: 150.0000 mg | ORAL_TABLET | Freq: Once | ORAL | Status: DC

## 2015-10-14 MED ORDER — AMOXICILLIN-POT CLAVULANATE 875-125 MG PO TABS: 1.0000 | ORAL_TABLET | Freq: Two times a day (BID) | ORAL | Status: DC

## 2015-10-14 NOTE — Progress Notes (Addendum)
Patient ID: Daisy Bennett, female   DOB: 09-06-1955, 60 y.o.   MRN: [id number]   Subjective:    Patient ID: Daisy Bennett, female    DOB: 08/25/1955, 60 y.o.   MRN: [id number]  HPI  Diabetes: Patient presents for routine scheduled diabetes follow-up. Last A1c was 6.4. Patient was able to exercise more, since she has had some knee trouble and not exercising as frequently. Amaryl, was discontinue ~6 months ago secondary to dizziness (pt thought her sugars were low). After discontinuation  Dizziness resolved. She continues to take the metformin 1000 mg twice a day, and tolerating well. She denies neuropathy, but does admit to a non-healing area on her right shin of 1 month duration. She states she had a skin cancer a few inches away from that area in the past and she feels it is either cancer or an infection. She denies redness or drainage, but states it had a "pus pocket", then turned brown and hard, then another small lesion formed next to it and it now has a pus-pocket. She is not using anything on the area, but keeps it clean and covered with a tegaderm. She denies fever. She is on gabapentin 600 mg BID.  Hypothyroid: Patient has had hyperthyroidism since 2010. Patient currently is prescribed levothyroxine 100 g daily. Patient denies any increased fatigue, constipation, dry skin or cold intolerance. Last TSH- 04/2014 0.961. She is in need of refills today.    Hypertension: Pt states when she is at home her BP is normal, below 140/90. She reports compliance with her medication regimen of Norvasc 10 mg, Coreg 6.25mg  BID, HCTZ 25 mg QD, Losartan/HCTZ 100-25. She had been exercising, but unable to do so now secondary to knee pain. H/O CAD, NSTEMI, post stent 2011 (OM)-collateralized RCA. Pt denies CP, DOE. She endorses LE edema that has not worsened. Pt is on BB, statin (low dose), ACE, fenofibrate, ASA 325. She follows with Dr. Johnsie Cancel, cardiology.  Current every day smoker -> states she will stop after  her cruise  Past Medical History  Diagnosis Date  . Multiple sclerosis ([place])   . SEMI (subendocardial myocardial infarction) ([place]) 09/29/09    TOTAL RCA WITH STENT TO OM BRANCH  . Unspecified essential hypertension   . DM (diabetes mellitus) ([place])   . Goiter, unspecified   . Unspecified hypothyroidism   . Non-ST elevated myocardial infarction (non-STEMI) ([place])   . Shingles   . History of colonic polyps 2012  . Tobacco abuse    Allergies  Allergen Reactions  . Betaseron [Interferon Beta-1b]     Increased LFT's   Social History   Social History  . Marital Status: Single    Spouse Name: N/A  . Number of Children: 2  . Years of Education: 14   Occupational History  . [employer]    Social History Main Topics  . Smoking status: Current Every Day Smoker -- 1.00 packs/day  . Smokeless tobacco: Never Used  . Alcohol Use: 0.0 oz/week    0 Standard drinks or equivalent per week     Comment: OCCASIONAL ALCOHOL USE.  . Drug Use: No  . Sexual Activity: Yes   Other Topics Concern  . Not on file   Social History Narrative   The patient lives with husband. 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    Patient drinks occasionally, does not chew tobacco, does not use recreational drugs. She does drink occasional caffeine. She does wear her  seatbelt. She does wear bike helmet riding a bike. She does exercise 3 times a week. She does not wear hearing aids or dentures. There is a smoke alarm in her home. There are no firearms in her home. She feels safe in her relationship. She has never experienced physical abuse.   Patient sleeps 7-8 hours a night.   Patient drinks 2 cups of caffeine daily.   Patient is right handed.    Review of Systems Negative, with the exception of above mentioned in HPI     Objective:   Physical Exam BP 148/84 mmHg  Pulse 68  Temp(Src) 97.8 F (36.6 C)  Resp 20  Wt 192 lb 8 oz (87.317 kg)  SpO2 98% Gen: Afebrile.  No acute distress. Nontoxic in appearance, well-developed, well-nourished, Caucasian female. Very pleasant. HENT: AT. [place]. MMM, no oral lesions.  Eyes:Pupils Equal Round Reactive to light, Extraocular movements intact,  Conjunctiva without redness, discharge or icterus. CV: RRR , trace edema, +2/4 P posterior tibialis pulses Chest: CTAB, no wheeze or crackles Skin: No rashes, purpura or petechiae. No erythema, x2 fluctuant round <1cm lesions mid right anterior shin with a Tegaderm over. No drainage noted.  Neuro:  Normal gait. PERLA. EOMi. Alert. Oriented x3  Psych: Normal affect, dress and demeanor. Normal speech. Normal thought content and judgment. Diabetic Foot Exam - Simple   Simple Foot Form  Diabetic Foot exam was performed with the following findings:  Yes 10/14/2015  9:39 AM  Visual Inspection  No deformities, no ulcerations, no other skin breakdown bilaterally:  Yes  See comments:  Yes  Sensation Testing  Intact to touch and monofilament testing bilaterally:  Yes  Pulse Check  Posterior Tibialis and Dorsalis pulse intact bilaterally:  Yes  Comments  Small corn formation right 4 th lateral toe.          Assessment & Plan:  1. Diabetes mellitus without complication ([place]) - POCT HgB A1C--> 7.1 goal for her is approximately 6.5-7.  - Pick back up the exercise regimen once knee is better,and continue dietary modification.  - Pt cautioned in callus/corn and skin infections with diabetes, She is to monitor feet closely and if any progression or open/nonhealing wounds needs to be seen immediately.  - Hypoglycemic episodes have resolved.  2. Hypothyroid: last tested 04/2014. Refills will be provided once level is received. - TSH today - follow yearly if normal  3. Hypertension:  - decrease salt, increase exercise.  - Continue to monitor at home if above 140/90 will need to see sooner and discuss.   4. Skin infection: - > 1 month infection. H/O skin cancer near site. Diabetic   Pt.  - Keep area clean and dry, Bactroban ointment and Augmentin provided (as well as diflucan) - F/U 1 week if no improvement.   5. Health maintanence: - Pt amendable to hep c screen today - PNA 23 today, restart of series (smoker/diabetes).  - prevnar 1 year.   Three-month follow-up for diabetes (a1c before room)

## 2015-10-14 NOTE — Telephone Encounter (Addendum)
Please call pt: - her thyroid is functioning normal. Refills called into mail order pharmacy, if needs immediately may call in 30 d at location of choice.  - Hep C negative

## 2015-10-14 NOTE — Addendum Note (Signed)
Addended by: Leota Jacobsen on: 10/14/2015 10:35 AM   Modules accepted: Orders

## 2015-10-14 NOTE — Patient Instructions (Signed)
Your A1c today is 7.1, goal is 6.5-7. After your cruise, make certain to get back to your diet and exercise. I do not want to have to increase your medications.  We have collected labs today to check your thyroid level and then I will refill your thyroid medication.   Hypertension: LOW salt diet, increase exercise.  Continue to monitor it at home--> if above 140/90, we will need to see you sooner and change medications.   You received your Hep C screen today.  You received the pneumonia vaccination  (#1). You will need the (#2) in 1 year from today.   Skin infection: Take medication as directed, and if not improved will need to see you when you return from your trip.   F/U: after June 14th for diabetes/HTN and then physical/preventive at end of August.  TAKE YOUR VITAMIN D

## 2015-10-15 LAB — HEPATITIS C ANTIBODY: HCV Ab: NEGATIVE

## 2015-10-15 NOTE — Telephone Encounter (Signed)
Spoke with patient reviewed lab results. 

## 2015-10-16 ENCOUNTER — Other Ambulatory Visit: Payer: Self-pay | Admitting: Family Medicine

## 2015-10-16 MED ORDER — FENOFIBRATE 160 MG PO TABS: 160.0000 mg | ORAL_TABLET | Freq: Every day | ORAL | Status: DC

## 2015-10-27 ENCOUNTER — Encounter: Payer: Self-pay | Admitting: Family Medicine

## 2015-10-27 ENCOUNTER — Ambulatory Visit (INDEPENDENT_AMBULATORY_CARE_PROVIDER_SITE_OTHER): Payer: 59 | Admitting: Family Medicine

## 2015-10-27 VITALS — BP 149/88 | HR 69 | Temp 98.1°F | Resp 20 | Wt 195.2 lb

## 2015-10-27 DIAGNOSIS — R062 Wheezing: Secondary | ICD-10-CM

## 2015-10-27 DIAGNOSIS — J209 Acute bronchitis, unspecified: Secondary | ICD-10-CM | POA: Diagnosis not present

## 2015-10-27 MED ORDER — IPRATROPIUM-ALBUTEROL 0.5-2.5 (3) MG/3ML IN SOLN
3.0000 mL | Freq: Once | RESPIRATORY_TRACT | Status: AC
  Administered 2015-10-27: 3 mL via RESPIRATORY_TRACT

## 2015-10-27 MED ORDER — DOXYCYCLINE HYCLATE 100 MG PO TABS: 100.0000 mg | ORAL_TABLET | Freq: Two times a day (BID) | ORAL | Status: DC

## 2015-10-27 NOTE — Patient Instructions (Signed)

## 2015-10-27 NOTE — Progress Notes (Signed)
Patient ID: Daisy Bennett, female   DOB: 02-Jul-1956, 60 y.o.   MRN: [id number]   Subjective:    Patient ID: Daisy Bennett, female    DOB: 09/17/1955, 60 y.o.   MRN: [id number]  HPI   Cough: patient went on a cruise last weekend started to noticed a cough the first day. Associated symptoms include hoarseness, shortness of breath and fatigue.She is a smoker and endorses smoking more while on vacation.  She also states she is having her house remodeling and there is dust in her home with sanding. She denies fever, chills, nausea, vomit, diarrhea. She reports feeling rattling in her chest with a cough. She does not have known lung disease, everyday smoker.  Past Medical History  Diagnosis Date  . Multiple sclerosis ([place])   . SEMI (subendocardial myocardial infarction) ([place]) 09/29/09    TOTAL RCA WITH STENT TO OM BRANCH  . Unspecified essential hypertension   . DM (diabetes mellitus) ([place])   . Goiter, unspecified   . Unspecified hypothyroidism   . Non-ST elevated myocardial infarction (non-STEMI) ([place])   . Shingles   . History of colonic polyps 2012  . Tobacco abuse    Allergies  Allergen Reactions  . Betaseron [Interferon Beta-1b]     Increased LFT's   Social History   Social History  . Marital Status: Single    Spouse Name: N/A  . Number of Children: 2  . Years of Education: 14   Occupational History  . [employer]    Social History Main Topics  . Smoking status: Current Every Day Smoker -- 1.00 packs/day  . Smokeless tobacco: Never Used  . Alcohol Use: 0.0 oz/week    0 Standard drinks or equivalent per week     Comment: OCCASIONAL ALCOHOL USE.  . Drug Use: No  . Sexual Activity: Yes   Other Topics Concern  . Not on file   Social History Narrative   The patient lives with husband. 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    Patient drinks occasionally, does not chew tobacco, does not use recreational drugs. She does drink  occasional caffeine. She does wear her seatbelt. She does wear bike helmet riding a bike. She does exercise 3 times a week. She does not wear hearing aids or dentures. There is a smoke alarm in her home. There are no firearms in her home. She feels safe in her relationship. She has never experienced physical abuse.   Patient sleeps 7-8 hours a night.   Patient drinks 2 cups of caffeine daily.   Patient is right handed.    Review of Systems Negative, with the exception of above mentioned in HPI     Objective:   Physical Exam BP 149/88 mmHg  Pulse 69  Temp(Src) 98.1 F (36.7 C)  Resp 20  Wt 195 lb 4 oz (88.565 kg)  SpO2 99% Gen: Afebrile. No acute distress. Nontoxic in appearance, well-developed, well-nourished, Caucasian female. Very pleasant. HENT: AT. [place]. MMM, no oral lesions.  Eyes:Pupils Equal Round Reactive to light, Extraocular movements intact,  Conjunctiva without redness, discharge or icterus. CV: RRR Chest: mild wheeze, RUL. No crackles Neuro:  Normal gait. PERLA. EOMi. Alert. Oriented x3      Assessment & Plan:  Daisy Bennett is a 60 y.o. female present for acute OV for cough 1. Wheezing - better air movement and wheeze resolved after treatment.  - ipratropium-albuterol (DUONEB) 0.5-2.5 (3) MG/3ML nebulizer solution 3 mL; Take  3 mLs by nebulization once.  2. Acute bronchitis, unspecified organism - rest, hydrate, mucinex --> STOP SMOKING - doxycycline (VIBRA-TABS) 100 MG tablet; Take 1 tablet (100 mg total) by mouth 2 (two) times daily.  Dispense: 20 tablet; Refill: 0  Electronically Signed by: Howard Pouch, DO [facility] [place]

## 2015-10-29 ENCOUNTER — Telehealth: Payer: Self-pay | Admitting: Family Medicine

## 2015-10-29 DIAGNOSIS — B370 Candidal stomatitis: Secondary | ICD-10-CM

## 2015-10-29 MED ORDER — FLUCONAZOLE 100 MG PO TABS
ORAL_TABLET | ORAL | Status: DC
Start: 2015-10-29 — End: 2015-11-21

## 2015-10-29 NOTE — Telephone Encounter (Signed)
Patient has developed thrush due to the antibiotic. Her tongue is "raw" and her mouth is white. Can she get an Rx?

## 2015-10-29 NOTE — Telephone Encounter (Signed)
Spoke with patient reviewed  instructions. Patient verbalized understanding. 

## 2015-10-29 NOTE — Telephone Encounter (Signed)
Diflucan 7 day treatment called in to pharmacy. If thrush does not resolve or returns after treatment she will need to be evaluated for other potential causes.

## 2015-11-21 ENCOUNTER — Encounter: Payer: Self-pay | Admitting: Family Medicine

## 2015-11-21 ENCOUNTER — Ambulatory Visit (INDEPENDENT_AMBULATORY_CARE_PROVIDER_SITE_OTHER): Payer: 59 | Admitting: Family Medicine

## 2015-11-21 VITALS — BP 160/83 | HR 63 | Temp 98.1°F | Resp 20 | Wt 198.2 lb

## 2015-11-21 DIAGNOSIS — L988 Other specified disorders of the skin and subcutaneous tissue: Secondary | ICD-10-CM | POA: Diagnosis not present

## 2015-11-21 DIAGNOSIS — R238 Other skin changes: Secondary | ICD-10-CM | POA: Insufficient documentation

## 2015-11-21 MED ORDER — TRIAMCINOLONE ACETONIDE 0.1 % EX CREA: 1.0000 "application " | TOPICAL_CREAM | Freq: Two times a day (BID) | CUTANEOUS | Status: DC

## 2015-11-21 NOTE — Progress Notes (Signed)
Patient ID: Daisy Bennett, female   DOB: 05/13/1956, 60 y.o.   MRN: [id number]    Daisy Bennett , 1956-01-16, 60 y.o., female MRN: [id number]   CC: Blisters of right foot Subjective: Pt presents for an acute OV with complaints of blisters on right foot (large toe and 3rd toe) of 6 day duration. She had small blisters on right shin a few months prior. She admits to small blisters in her mouth after doxycycline use in the past. Associated symptoms include flacid bullae, no pain or itching associated. They are getting larger. She does not feel they came from friction . She has a history of diabetes and Multiple sclerosis. She denies fever or redness, she states her husband stepped on her toe yesterday and broke the blister on her large toe.  Allergies  Allergen Reactions  . Betaseron [Interferon Beta-1b]     Increased LFT's  . Doxycycline Other (See Comments)    Thrush,dizziness   Social History  Substance Use Topics  . Smoking status: Current Every Day Smoker -- 1.00 packs/day  . Smokeless tobacco: Never Used  . Alcohol Use: 0.0 oz/week    0 Standard drinks or equivalent per week     Comment: OCCASIONAL ALCOHOL USE.   Past Medical History  Diagnosis Date  . Multiple sclerosis ([place])   . SEMI (subendocardial myocardial infarction) ([place]) 09/29/09    TOTAL RCA WITH STENT TO OM BRANCH  . Unspecified essential hypertension   . DM (diabetes mellitus) ([place])   . Goiter, unspecified   . Unspecified hypothyroidism   . Non-ST elevated myocardial infarction (non-STEMI) ([place])   . Shingles   . History of colonic polyps 2012  . Tobacco abuse    Past Surgical History  Procedure Laterality Date  . Cataract surgery  12/10  . Vesicovaginal fistula closure w/ tah    . Cesarean section    . Tonsillectomy    . Coronary angioplasty with stent placement     Family History  Problem Relation Age of Onset  . Hypertension Mother   . Heart failure Father   . Aneurysm Brother     THORACIC/ABD  ANEURYSM  . Prostate cancer Brother 34  . Multiple sclerosis Neg Hx   . Fibromyalgia Sister   . Brain cancer Maternal Grandmother 88     Medication List       This list is accurate as of: 11/21/15 11:36 AM.  Always use your most recent med list.               amitriptyline 50 MG tablet  Commonly known as:  ELAVIL  Take 1 tablet (50 mg total) by mouth daily.     amLODipine 10 MG tablet  Commonly known as:  NORVASC  Take 1 tablet (10 mg total) by mouth daily.     aspirin EC 325 MG tablet  Take 325 mg by mouth at bedtime.     carvedilol 6.25 MG tablet  Commonly known as:  COREG  Take 1 tablet (6.25 mg total) by mouth 2 (two) times daily with a meal.     cholecalciferol 1000 units tablet  Commonly known as:  VITAMIN D  Take 1,000 Units by mouth daily.     CINNAMON PO  Take 2,000 mg by mouth daily.     Dimethyl Fumarate 240 MG Cpdr  Commonly known as:  TECFIDERA  Take 1 capsule (240 mg total) by mouth 2 (two) times daily.     esomeprazole 20 MG capsule  Commonly known as:  NEXIUM  Take 20 mg by mouth daily at 12 noon.     fenofibrate 160 MG tablet  Take 1 tablet (160 mg total) by mouth daily.     fish oil-omega-3 fatty acids 1000 MG capsule  Take 1-2 g by mouth daily.     gabapentin 600 MG tablet  Commonly known as:  NEURONTIN  Take 1 tablet (600 mg total) by mouth 2 (two) times daily.     glucose blood test strip  DX:E11.9, monitor fasting BG daily.     hydrochlorothiazide 25 MG tablet  Commonly known as:  HYDRODIURIL  Take 25 mg by mouth daily.     Iron 325 (65 Fe) MG Tabs  Take 1 tablet by mouth daily.     levothyroxine 100 MCG tablet  Commonly known as:  SYNTHROID, LEVOTHROID  Take 1 tablet (100 mcg total) by mouth daily.     losartan-hydrochlorothiazide 100-25 MG tablet  Commonly known as:  HYZAAR  Take 1 tablet by mouth daily.     metFORMIN 1000 MG tablet  Commonly known as:  GLUCOPHAGE  Take 1 tablet (1,000 mg total) by mouth 2 (two) times  daily with a meal.     MULTIVITAMINS PO  Take 1 tablet by mouth at bedtime. daily     mupirocin nasal ointment 2 %  Commonly known as:  [place] over affected area TID     nitroGLYCERIN 0.4 MG SL tablet  Commonly known as:  NITROSTAT  Place 1 tablet (0.4 mg total) under the tongue every 5 (five) minutes as needed for chest pain.     prasugrel 10 MG Tabs tablet  Commonly known as:  EFFIENT  Take 1 tablet (10 mg total) by mouth daily.     rosuvastatin 10 MG tablet  Commonly known as:  CRESTOR  Take 1 tablet (10 mg total) by mouth daily.     venlafaxine XR 75 MG 24 hr capsule  Commonly known as:  EFFEXOR-XR  Take 75 mg by mouth daily.        ROS: Negative, with the exception of above mentioned in HPI  Objective:  BP 160/83 mmHg  Pulse 63  Temp(Src) 98.1 F (36.7 C) (Oral)  Resp 20  Wt 198 lb 4 oz (89.926 kg)  SpO2 100% Body mass index is 32.01 kg/(m^2). Gen: Afebrile. No acute distress. Toxic appearance, well-developed, well-nourished, Caucasian female, no acute distress. HENT: AT. .  MMM, no oral lesions.  Eyes:Pupils Equal Round Reactive to light, Extraocular movements intact,  Conjunctiva without redness, discharge or icterus. Skin: No rashes, purpura or petechiae. Ruptured blister right great toe, no signs of infection, mild serous drainage. Approximately 3 cm oval shaped large bullae over third toe. No erythema. No tenderness. Neuro: Normal gait. PERLA. EOMi. Alert. Oriented x3  Psych: Normal affect, dress and demeanor. Normal speech. Normal thought content and judgment..   Assessment/Plan: [name] is a 60 y.o. female present for acute OV for  1. Bullae - Discussed with patient her prior shin "infection" and in her mouth lesions could all be connected to what we are now seeing with her 2 blisters on her large toe and third toe today. She does have multiple sclerosis and diabetes, therefore differential of ileus diabetic and pemphigus/pemphigoid  diagnoses. - Acutely main concern is her being a diabetic and making sure these do not become infected upon healing. Patient was advised not to break open the blisters. Do not or she is to  put pressure over the area. Attempt to keep covered when outside. Use Bactroban ointment to keep a broken blister from becoming infected. In a referral to wound center was placed today. - Triamcinolone cream for intact blister until able to get into wound center. - Patient is to follow-up within a week. - Dermatology referral placed for pemphigus workup.  -  AVS on bullous pemphigoid was provided to patient today. - AMB referral to wound care center  electronically signed by:  Howard Pouch, DO  [place]

## 2015-11-21 NOTE — Patient Instructions (Signed)
Keep covered if outside the home. Wound care referral today to make sure it heals without infection.  Bullous Pemphigoid Bullous pemphigoid is a skin disease that causes blisters to form. It ranges in severity and can last for a long time. The disease can come back months or years after it goes away.  CAUSES The cause of this condition is not known. Certain medicines and conditions, such as diabetes and multiple sclerosis, may be causes. Bullous pemphigoid is an autoimmune disease. This means that the body's own defense system (immune system) attacks the body. RISK FACTORS This condition is more likely to develop in people over the age of 69. SYMPTOMS This condition causes blisters to form on the skin. In mild cases, only a few small blisters form. In severe cases, many large blisters form in several areas of the body. The most common places blisters form are the groin, armpits, trunk, thighs, and forearms. About one third of people with this condition develop blisters in the mouth. The blisters may break open, forming ulcers.  Other symptoms of this condition include:  Redness.  Irritation.  Itching.  Bleeding gums.  Difficulty eating.  Cough.  Pain with swallowing.  Nosebleeds. In most people, the symptoms of bullous pemphigoid go away within 5 years. DIAGNOSIS This condition may be diagnosed with a physical exam and blood tests. A procedure in which a skin sample is taken for testing (skin biopsy) may be done to confirm the diagnosis. TREATMENT This condition may be managed with medicines, such as:  Antibiotic medicines.  Steroid medicines. These may be applied to the skin, taken by mouth, or given as injections.  Medicines that suppress the immune system. If the mouth or lips are affected, a change in diet may also be recommended. People with severe symptoms may need to be treated at a hospital, where they may receive:  Care for their ulcers.  Medicines given through an  IV tube.  Feedings given through an IV tube. This may be done if the mouth or lips are affected. HOME CARE INSTRUCTIONS  Take medicines only as directed by your health care provider.  Keep your skin clean.  Do not scratch, break, or drain your blisters. Doing so can cause them to become infected.  Follow your health care provider's instructions about bandage (dressing) changes and removal.  If your mouth or lips are affected:  Eat a diet made up of soft foods and liquids only.  Avoid drinking very hot liquids. SEEK MEDICAL CARE IF:  Your itching or pain is not helped by medicine.  You develop redness, swelling, or pain that extends beyond your blisters or ulcers.  There is pus coming from a blister or ulcer.  You have a fever.  You have confusion.  You feel unusually tired or weak. SEEK IMMEDIATE MEDICAL CARE IF:  Your pain becomes severe.  You cannot eat or drink because of blisters, ulcers, or pain in your lips or mouth.  You cannot care for yourself because of blisters, ulcers, or pain in your hands or in the soles of your feet.   This information is not intended to replace advice given to you by your health care provider. Make sure you discuss any questions you have with your health care provider.   Document Released: 05/16/2007 Document Revised: 12/03/2014 Document Reviewed: 07/15/2014 Elsevier Interactive Patient Education [employer].

## 2015-11-28 ENCOUNTER — Telehealth: Payer: Self-pay | Admitting: *Deleted

## 2015-11-28 NOTE — Telephone Encounter (Signed)
Patient called states she saw Dermatology and they told her she didn't need to go to wound center she has cancelled that appt . Patient states her wounds are healing nicely and dermatology is treating. She just wanted [place] to know.

## 2015-12-09 ENCOUNTER — Encounter (HOSPITAL_BASED_OUTPATIENT_CLINIC_OR_DEPARTMENT_OTHER): Payer: 59

## 2015-12-15 ENCOUNTER — Telehealth: Payer: Self-pay | Admitting: Family Medicine

## 2015-12-15 ENCOUNTER — Encounter: Payer: Self-pay | Admitting: Family Medicine

## 2015-12-15 DIAGNOSIS — Z86010 Personal history of colonic polyps: Secondary | ICD-10-CM

## 2015-12-15 NOTE — Telephone Encounter (Signed)
Pt due for follow colonoscopy and would like to stay with [facility] if possible. Prior colonoscopy completed in [place] ? 01/05/2011, with 5 year follow up. Pt received a letter from prior GI stating it was time for follow-up. Pt has a history of colon polyps, we do not have record of pathology or details of her procedure.

## 2015-12-23 ENCOUNTER — Encounter: Payer: Self-pay | Admitting: Family Medicine

## 2015-12-23 ENCOUNTER — Encounter: Payer: Self-pay | Admitting: Adult Health

## 2015-12-24 ENCOUNTER — Other Ambulatory Visit: Payer: Self-pay | Admitting: *Deleted

## 2015-12-24 MED ORDER — GABAPENTIN 600 MG PO TABS: 600.0000 mg | ORAL_TABLET | Freq: Two times a day (BID) | ORAL | Status: DC

## 2015-12-24 MED ORDER — AMITRIPTYLINE HCL 50 MG PO TABS: 50.0000 mg | ORAL_TABLET | Freq: Every day | ORAL | Status: DC

## 2015-12-24 NOTE — Telephone Encounter (Signed)
Done per request.  Has appt 03-10-17 at 0900 with Ward Givens, NP.

## 2016-01-12 NOTE — Progress Notes (Signed)
Patient ID: Daisy Bennett, female   DOB: 09-24-1955, 60 y.o.   MRN: [id number]   Daisy Bennett is seen f/u CAD  post stenting 2011  She had a non ST elevation MI with subsequent stenting of an OM. She has diffuse 3VD with a total right that is collateralized. She had smoewhat atypical presentation with her DM. [name] She has a benign systolic murmur and does not need SBE No chest pain. [place] more. Has LE edema that seems to be getting worse   Labs 9/15   Remarkable for Ht 32 and Cr .75  LDL 45 TC 123 normal LFTs  She is walking 2 miles / day She started smoking again in January Counseled for less than 10 minutes on cessation I suspect she can quit as she had been off for 6 years  Did run first 5 K with her dog  Lots of dermatology issues had multiple lesions removed from right leg and thigh She is fatigued Husband having hip surgery in [place] next month   She has stopped smoking a month ago   ROS: Denies fever, malais, weight loss, blurry vision, decreased visual acuity, cough, sputum, SOB, hemoptysis, pleuritic pain, palpitaitons, heartburn, abdominal pain, melena, lower extremity edema, claudication, or rash.  All other systems reviewed and negative  General: Affect appropriate Healthy:  appears stated age 54: normal Neck supple with no adenopathy JVP normal no bruits no thyromegaly Lungs clear with no wheezing and good diaphragmatic motion Heart:  S1/S2 1/6 SEM  murmur, no rub, gallop or click PMI normal Abdomen: benighn, BS positve, no tenderness, no AAA no bruit.  No HSM or HJR Distal pulses intact with no bruits No edema Neuro non-focal Skin warm and dry  Recent excision and biopsy right leg  No muscular weakness   Current Outpatient Prescriptions  Medication Sig Dispense Refill  . amitriptyline (ELAVIL) 50 MG tablet Take 1 tablet (50 mg total) by mouth daily. 90 tablet 3  . amLODipine (NORVASC) 10 MG tablet Take 1 tablet (10 mg total) by mouth daily. 90 tablet 3  . aspirin  EC 325 MG EC tablet Take 325 mg by mouth at bedtime.     . carvedilol (COREG) 6.25 MG tablet Take 1 tablet (6.25 mg total) by mouth 2 (two) times daily with a meal. 180 tablet 3  . cholecalciferol (VITAMIN D) 1000 units tablet Take 1,000 Units by mouth daily.    [name] CINNAMON PO Take 2,000 mg by mouth daily.     . Dimethyl Fumarate (TECFIDERA) 240 MG CPDR Take 1 capsule (240 mg total) by mouth 2 (two) times daily. 60 capsule 6  . esomeprazole (NEXIUM) 20 MG capsule Take 20 mg by mouth daily at 12 noon.    . fenofibrate 160 MG tablet Take 1 tablet (160 mg total) by mouth daily. 90 tablet 1  . Ferrous Sulfate (IRON) 325 (65 FE) MG TABS Take 1 tablet by mouth daily.      . fish oil-omega-3 fatty acids 1000 MG capsule Take 1-2 g by mouth daily.     [name] gabapentin (NEURONTIN) 600 MG tablet Take 1 tablet (600 mg total) by mouth 2 (two) times daily. 180 tablet 3  . glucose blood test strip DX:E11.9, monitor fasting BG daily. 100 each 11  . hydrochlorothiazide (HYDRODIURIL) 25 MG tablet Take 25 mg by mouth daily.    [name] levothyroxine (SYNTHROID, LEVOTHROID) 100 MCG tablet Take 1 tablet (100 mcg total) by mouth daily. 90 tablet 3  . losartan-hydrochlorothiazide (HYZAAR) 100-25  MG tablet Take 1 tablet by mouth daily. 90 tablet 3  . metFORMIN (GLUCOPHAGE) 1000 MG tablet Take 1 tablet (1,000 mg total) by mouth 2 (two) times daily with a meal. 60 tablet 5  . Multiple Vitamin (MULTIVITAMINS PO) Take 1 tablet by mouth at bedtime. daily    . mupirocin nasal ointment (BACTROBAN) 2 % Place over affected area TID 10 g 0  . prasugrel (EFFIENT) 10 MG TABS tablet Take 1 tablet (10 mg total) by mouth daily. 90 tablet 3  . rosuvastatin (CRESTOR) 10 MG tablet Take 1 tablet (10 mg total) by mouth daily. 90 tablet 3  . triamcinolone cream (KENALOG) 0.1 % Apply 1 application topically 2 (two) times daily. 30 g 0  . venlafaxine (EFFEXOR-XR) 75 MG 24 hr capsule Take 75 mg by mouth daily.      . nitroGLYCERIN (NITROSTAT) 0.4 MG SL  tablet Place 1 tablet (0.4 mg total) under the tongue every 5 (five) minutes as needed for chest pain. 25 tablet 4   No current facility-administered medications for this visit.   Facility-Administered Medications Ordered in Other Visits  Medication Dose Route Frequency Provider Last Rate Last Dose  . gadopentetate dimeglumine (MAGNEVIST) injection 18 mL  18 mL Intravenous Once PRN Kathrynn Ducking, MD        Allergies  Betaseron and Doxycycline  Electrocardiogram:  03/14/13   SR normal rate 64   SR rate 67  Normal no change 09/18/14  01/13/16  SR rate 73  Nonspecific ST changes   Assessment and Plan CAD: Collateralized RCA and stent to OM in 2011 no angina continue medical Rx  Has nitro HTN: Well controlled.  Continue current medications and low sodium Dash type diet.   DM:  F/u primary meds being adjusted   Lab Results  Component Value Date   HGBA1C 7.1 10/14/2015    MS:  Stable continue activity Chol: continue statin  Smoking  Been quit for lat 63months congratulated her on this Derm:  F/u with them for biopsy results.  Incisions healing well   Jenkins Rouge

## 2016-01-13 ENCOUNTER — Encounter: Payer: Self-pay | Admitting: Cardiovascular Disease

## 2016-01-13 ENCOUNTER — Ambulatory Visit (INDEPENDENT_AMBULATORY_CARE_PROVIDER_SITE_OTHER): Payer: 59 | Admitting: Cardiovascular Disease

## 2016-01-13 VITALS — BP 140/84 | HR 76 | Ht 66.5 in | Wt 193.8 lb

## 2016-01-13 DIAGNOSIS — I2510 Atherosclerotic heart disease of native coronary artery without angina pectoris: Secondary | ICD-10-CM

## 2016-01-13 DIAGNOSIS — I10 Essential (primary) hypertension: Secondary | ICD-10-CM | POA: Diagnosis not present

## 2016-01-13 DIAGNOSIS — I2583 Coronary atherosclerosis due to lipid rich plaque: Principal | ICD-10-CM

## 2016-01-13 NOTE — Patient Instructions (Signed)

## 2016-01-16 NOTE — Addendum Note (Signed)
Addended by: Freada Bergeron on: 01/16/2016 05:35 PM   Modules accepted: Orders

## 2016-02-10 ENCOUNTER — Encounter: Payer: Self-pay | Admitting: Family Medicine

## 2016-02-10 ENCOUNTER — Ambulatory Visit (INDEPENDENT_AMBULATORY_CARE_PROVIDER_SITE_OTHER): Payer: 59 | Admitting: Family Medicine

## 2016-02-10 VITALS — BP 126/79 | HR 81 | Temp 98.4°F | Resp 20 | Ht 66.5 in | Wt 191.2 lb

## 2016-02-10 DIAGNOSIS — E119 Type 2 diabetes mellitus without complications: Secondary | ICD-10-CM

## 2016-02-10 DIAGNOSIS — T8189XA Other complications of procedures, not elsewhere classified, initial encounter: Secondary | ICD-10-CM

## 2016-02-10 DIAGNOSIS — I10 Essential (primary) hypertension: Secondary | ICD-10-CM

## 2016-02-10 LAB — POCT GLYCOSYLATED HEMOGLOBIN (HGB A1C): Hemoglobin A1C: 7.4

## 2016-02-10 MED ORDER — HYDROCHLOROTHIAZIDE 25 MG PO TABS: 25.0000 mg | ORAL_TABLET | Freq: Every day | ORAL | Status: DC

## 2016-02-10 MED ORDER — MUPIROCIN CALCIUM 2 % NA OINT: TOPICAL_OINTMENT | NASAL | Status: DC

## 2016-02-10 MED ORDER — METFORMIN HCL 1000 MG PO TABS: 1000.0000 mg | ORAL_TABLET | Freq: Two times a day (BID) | ORAL | Status: DC

## 2016-02-10 NOTE — Progress Notes (Signed)
Patient ID: BECKHAM MACKELLAR, female   DOB: May 12, 1956, 60 y.o.   MRN: [id number]   Subjective:    Patient ID: KIIRA DOMINO, female    DOB: [birth year], 60 y.o.   MRN: [id number]  HPI  Diabetes/nonhealing wound: Patient presents for routine scheduled diabetes follow-up. Last A1c was 6.4--> 7.1. Patient was not able to exercise secondary to knee problems and right shin SCC excision. Amaryl, was discontinue ~9 months ago secondary to dizziness (pt thought her sugars were low). After discontinuation  Dizziness resolved. She continues to take the metformin 1000 mg twice a day, and tolerating well. She denies neuropathy, but does admit to a non-healing area on her right shin after excision of SCC by dermatologist. She is not using anything on the area, but keeps it clean and covered.  She denies fever. She is on gabapentin 600 mg BID.  Hypertension: Pt states when she is at home her BP is normal, below 140/90. She reports compliance with her medication regimen of Norvasc 10 mg, Coreg 6.25mg  BID, HCTZ 25 mg QD, Losartan/HCTZ 100-25. She had been exercising, but unable to do so now secondary to knee pain. H/O CAD, NSTEMI, post stent 2011 (OM)-collateralized RCA. Pt denies CP, DOE. She endorses LE edema that has not worsened. Pt is on BB, statin (low dose), ACE, fenofibrate, ASA 325. She follows with Dr. Johnsie Cancel, cardiology.  Stopped smoking!  Past Medical History  Diagnosis Date  . Multiple sclerosis ([place])   . SEMI (subendocardial myocardial infarction) ([place]) 09/29/09    TOTAL RCA WITH STENT TO OM BRANCH  . Unspecified essential hypertension   . DM (diabetes mellitus) ([place])   . Goiter, unspecified   . Unspecified hypothyroidism   . Non-ST elevated myocardial infarction (non-STEMI) ([place])   . Shingles   . History of colonic polyps 2012  . Tobacco abuse   . Cancer ([place])     basal and squamous cell skin cancer   Allergies  Allergen Reactions  . Betaseron [Interferon Beta-1b]     Increased LFT's    . Doxycycline Other (See Comments)    Thrush,dizziness   Social History   Social History  . Marital Status: Single    Spouse Name: N/A  . Number of Children: 2  . Years of Education: 14   Occupational History  . [employer]    Social History Main Topics  . Smoking status: Former Smoker -- 1.00 packs/day    Quit date: 12/16/2015  . Smokeless tobacco: Never Used  . Alcohol Use: 0.0 oz/week    0 Standard drinks or equivalent per week     Comment: OCCASIONAL ALCOHOL USE.  . Drug Use: No  . Sexual Activity: Yes   Other Topics Concern  . Not on file   Social History Narrative   The patient lives with husband. 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    Patient drinks occasionally, does not chew tobacco, does not use recreational drugs. She does drink occasional caffeine. She does wear her seatbelt. She does wear bike helmet riding a bike. She does exercise 3 times a week. She does not wear hearing aids or dentures. There is a smoke alarm in her home. There are no firearms in her home. She feels safe in her relationship. She has never experienced physical abuse.   Patient sleeps 7-8 hours a night.   Patient drinks 2 cups of caffeine daily.   Patient is right handed.    Review of Systems  Negative, with the exception of above mentioned in HPI     Objective:   Physical Exam BP 126/79 mmHg  Pulse 81  Temp(Src) 98.4 F (36.9 C) (Oral)  Resp 20  Ht 5' 6.5" (1.689 m)  Wt 191 lb 4 oz (86.75 kg)  BMI 30.41 kg/m2  SpO2 95% Gen: Afebrile. No acute distress. Nontoxic in appearance, well-developed, well-nourished, Caucasian female. Very pleasant. HENT: AT. [place]. MMM, no oral lesions.  Eyes:Pupils Equal Round Reactive to light, Extraocular movements intact,  Conjunctiva without redness, discharge or icterus. CV: RRR , No edema, +2/4 P posterior tibialis pulses Chest: CTAB, no wheeze or crackles Skin: right shin incision with mild erythema,  nonhealing wound middle of incision with granulation tissue, no drainage.  Neuro:  Normal gait. PERLA. EOMi. Alert. Oriented x3  Psych: Normal affect, dress and demeanor. Normal speech. Normal thought content and judgment.      Assessment & Plan:  Diabetes mellitus without complication (HCC)/nonhealing wound - Worsening.  - POCT HgB A1C--> 7.1--> 7.4,  goal for her is approximately 6.5-7.  - continue metformin 1000 mg BID - restart amaryl 1 mg daily.  - Prevnar due 09/2016, foot exam UTD, Eye exam UTD - Microalbumin collection with CPE - increase exercise regimen and continue dietary modification.  - Pt cautioned in callus/corn and skin infections with diabetes, She is to monitor feet closely. Monitor right shin wound. Bactroban prescribed and pt to call derm office if not completely healed in 1-2 weeks.  - 4 weeks touch base on sugars/dose. Refills at that time.  - F/U 3 months for DM  Hypertension:  - Stable today - low salt, increase exercise.  - Continue to monitor at home if above 140/90 will need to see sooner and discuss.  - refills on HCTZ, Continue Norvasc, Hyzaar      > 25 minutes spent with patient, >50% of time spent face to face counseling patient and coordinating care.  Electronically Signed by: Howard Pouch, DO [facility]

## 2016-02-10 NOTE — Patient Instructions (Signed)
Check your fasting glucose in the morning. If above 120 routinely after a few weeks of restarting the Amaryl once a day, I would increase the Amaryl to two times a day.  I have also called in cream for you r leg, if not improving in 1-2 weeks or looks infected please contact your dermatology office.  You will just need to call in at that time to discuss.   Follow up with me in 3 months for diabetes Physical in September.

## 2016-02-19 ENCOUNTER — Telehealth: Payer: Self-pay | Admitting: *Deleted

## 2016-02-19 NOTE — Telephone Encounter (Signed)
PA approved to [employer] by Christella Scheuermann - request [phone] - valid through 02/15/17.

## 2016-02-20 ENCOUNTER — Encounter: Payer: Self-pay | Admitting: Family Medicine

## 2016-03-01 ENCOUNTER — Encounter: Payer: Self-pay | Admitting: Family Medicine

## 2016-03-02 ENCOUNTER — Encounter: Payer: Self-pay | Admitting: Family Medicine

## 2016-03-02 ENCOUNTER — Other Ambulatory Visit: Payer: Self-pay | Admitting: *Deleted

## 2016-03-02 MED ORDER — FENOFIBRATE 160 MG PO TABS: 160.0000 mg | ORAL_TABLET | Freq: Every day | ORAL | 1 refills | Status: DC

## 2016-03-02 NOTE — Telephone Encounter (Signed)
Fenofibrate refilled. 

## 2016-03-09 ENCOUNTER — Encounter: Payer: Self-pay | Admitting: Gastroenterology

## 2016-03-10 ENCOUNTER — Telehealth: Payer: Self-pay | Admitting: Gastroenterology

## 2016-03-10 ENCOUNTER — Encounter: Payer: Self-pay | Admitting: Cardiovascular Disease

## 2016-03-10 ENCOUNTER — Encounter: Payer: Self-pay | Admitting: Adult Health

## 2016-03-10 ENCOUNTER — Ambulatory Visit (INDEPENDENT_AMBULATORY_CARE_PROVIDER_SITE_OTHER): Payer: 59 | Admitting: Adult Health

## 2016-03-10 VITALS — BP 140/71 | HR 74 | Ht 66.5 in | Wt 194.2 lb

## 2016-03-10 DIAGNOSIS — R413 Other amnesia: Secondary | ICD-10-CM | POA: Diagnosis not present

## 2016-03-10 DIAGNOSIS — G35 Multiple sclerosis: Secondary | ICD-10-CM | POA: Diagnosis not present

## 2016-03-10 DIAGNOSIS — G4719 Other hypersomnia: Secondary | ICD-10-CM

## 2016-03-10 DIAGNOSIS — Z5181 Encounter for therapeutic drug level monitoring: Secondary | ICD-10-CM

## 2016-03-10 NOTE — Progress Notes (Signed)
I have read the note, and I agree with the clinical assessment and plan.  Daisy Bennett KEITH   

## 2016-03-10 NOTE — Telephone Encounter (Signed)
Received colon report. Patient states that she is due for another colonoscopy and is requesting to see Dr. Loletha Carrow. Records placed on Dr. Corena Pilgrim desk for review.

## 2016-03-10 NOTE — Patient Instructions (Signed)
Continue tecfidera Blood work today Mri brain ordered Referral to sleep medicine If your symptoms worsen or you develop new symptoms please let us know.

## 2016-03-10 NOTE — Progress Notes (Signed)
PATIENT: Daisy Bennett DOB: 1956/07/13  REASON FOR VISIT: follow up HISTORY FROM: patient  HISTORY OF PRESENT ILLNESS: Daisy Bennett is a 60 year old female with a history of multiple sclerosis. She returns today for follow-up. She continues to take Tecfidera and tolerates it well. Denies any new numbness or weakness. No change in the balance or gait. no Changes in vision. No change in the bowels or bladder. She does report that within the last several months she's noticed excessive daytime sleepiness. She works from home and finds that she has a hard time staying awake during the day. She is also noticed some changes with her cognition. She reports word finding difficulty. States that she will interchange words in conversations. For example she will say the word couch when she means chair. So states that with her job people will ask about a reports she completed 3 months prior and she has no recollection of it. She is able to complete all ADLs independently. She operates a Teacher, music without difficulty. She handles her finances without difficulty. She states that she does prepare meals and occasionally she has found herself using incorrect spices for familiar recipes. She denies any significant stress or anxiety. Denies depression. She is not aware if she snores or if there is been any apnea events. She returns today for an evaluation.  HISTORY 09/11/15: Daisy Bennett is a 60 year old female with a history of multiple sclerosis. She returns today for follow-up. The patient is currently taking Tecfidera and tolerating it well. She denies any new neurological symptoms. She states that she does not have any new numbness or weakness. No changes with the bowels or bladder. She states that she has issues with her balance but no significant changes. No changes with her gait. No changes with the vision. She states that the syncopal episodes that was described at the last visit was due to low blood sugar.  Overall she feels that she is doing well. She returns today for an evaluation.  HISTORY 03/10/15: Daisy Bennett is a 60 year old right-handed white female with a history of multiple sclerosis that was diagnosed in 1996. Initially, the patient was followed through [facility], she was placed on Betaseron, and was on this medication for a number of years. In October 2013, the patient went off of Betaseron in part because she did not tolerate the subcutaneous injections, and in part secondary to liver enzyme elevations from the Betaseron. The patient indicated that her diagnosis occurred after she lost hearing in the left ear, MRI evaluation showed brain lesions, and eventually she developed numbness on the left lower body including the left leg associated with a gait disorder. She never had lumbar puncture done, she was started on medications shortly thereafter. The patient has done relatively well without significant progression of her multiple sclerosis. The patient has diabetes, she denies any significant numbness or weakness of the extremities, she does have chronic gait instability, she will fall on occasion, but she does not use a cane for ambulation. She denies any issues controlling the bowels or the bladder. She denies any visual field changes, dizziness, double vision, or weakness. She recently had blood work done in March 2016 that included a liver panel that did not show a liver enzyme elevation. She has not had a CBC in greater than one year. She did sustain a bout of shingles on the right T4 level. She has had 2 episodes of near-syncope, one before the outbreak of shingles, one afterwards. The patient  did not check her blood sugar during the episodes that left her with fatigue afterwards. She denies any palpitations of the heart. She does have a history of heart disease. She comes to this office for an evaluation.  REVIEW OF SYSTEMS: Out of a complete 14 system review of symptoms, the patient  complains only of the following symptoms, and all other reviewed systems are negative.  Memory loss, speech difficulty, daytime sleepiness, fatigue  ALLERGIES: Allergies  Allergen Reactions  . Betaseron [Interferon Beta-1b]     Increased LFT's  . Doxycycline Other (See Comments)    Thrush,dizziness    HOME MEDICATIONS: Outpatient Medications Prior to Visit  Medication Sig Dispense Refill  . amitriptyline (ELAVIL) 50 MG tablet Take 1 tablet (50 mg total) by mouth daily. 90 tablet 3  . amLODipine (NORVASC) 10 MG tablet Take 1 tablet (10 mg total) by mouth daily. 90 tablet 3  . aspirin EC 325 MG EC tablet Take 325 mg by mouth at bedtime.     . carvedilol (COREG) 6.25 MG tablet Take 1 tablet (6.25 mg total) by mouth 2 (two) times daily with a meal. 180 tablet 3  . cholecalciferol (VITAMIN D) 1000 units tablet Take 1,000 Units by mouth daily.    [name] CINNAMON PO Take 2,000 mg by mouth daily.     . Dimethyl Fumarate (TECFIDERA) 240 MG CPDR Take 1 capsule (240 mg total) by mouth 2 (two) times daily. 60 capsule 6  . esomeprazole (NEXIUM) 20 MG capsule Take 20 mg by mouth daily at 12 noon.    . fenofibrate 160 MG tablet Take 1 tablet (160 mg total) by mouth daily. 90 tablet 1  . Ferrous Sulfate (IRON) 325 (65 FE) MG TABS Take 1 tablet by mouth daily.      . fish oil-omega-3 fatty acids 1000 MG capsule Take 1-2 g by mouth daily.     [name] gabapentin (NEURONTIN) 600 MG tablet Take 1 tablet (600 mg total) by mouth 2 (two) times daily. 180 tablet 3  . glucose blood test strip DX:E11.9, monitor fasting BG daily. 100 each 11  . hydrochlorothiazide (HYDRODIURIL) 25 MG tablet Take 1 tablet (25 mg total) by mouth daily. 90 tablet 1  . levothyroxine (SYNTHROID, LEVOTHROID) 100 MCG tablet Take 1 tablet (100 mcg total) by mouth daily. 90 tablet 3  . losartan-hydrochlorothiazide (HYZAAR) 100-25 MG tablet Take 1 tablet by mouth daily. 90 tablet 3  . metFORMIN (GLUCOPHAGE) 1000 MG tablet Take 1 tablet (1,000 mg  total) by mouth 2 (two) times daily with a meal. 180 tablet 5  . Multiple Vitamin (MULTIVITAMINS PO) Take 1 tablet by mouth at bedtime. daily    . mupirocin nasal ointment (BACTROBAN) 2 % Place over affected area TID 10 g 0  . prasugrel (EFFIENT) 10 MG TABS tablet Take 1 tablet (10 mg total) by mouth daily. 90 tablet 3  . rosuvastatin (CRESTOR) 10 MG tablet Take 1 tablet (10 mg total) by mouth daily. 90 tablet 3  . venlafaxine (EFFEXOR-XR) 75 MG 24 hr capsule Take 75 mg by mouth daily.      . nitroGLYCERIN (NITROSTAT) 0.4 MG SL tablet Place 1 tablet (0.4 mg total) under the tongue every 5 (five) minutes as needed for chest pain. 25 tablet 4  . triamcinolone cream (KENALOG) 0.1 % Apply 1 application topically 2 (two) times daily. (Patient not taking: Reported on 02/10/2016) 30 g 0   Facility-Administered Medications Prior to Visit  Medication Dose Route Frequency Provider Last Rate Last  Dose  . gadopentetate dimeglumine (MAGNEVIST) injection 18 mL  18 mL Intravenous Once PRN Kathrynn Ducking, MD        PAST MEDICAL HISTORY: Past Medical History:  Diagnosis Date  . Basal cell carcinoma   . Cancer ([place])    basal and squamous cell skin cancer  . DM (diabetes mellitus) ([place])   . Goiter, unspecified   . History of colonic polyps 2012  . Multiple sclerosis ([place])   . Non-ST elevated myocardial infarction (non-STEMI) ([place])   . SCCA (squamous cell carcinoma) of skin    right shin  . SEMI (subendocardial myocardial infarction) ([place]) 09/29/09   TOTAL RCA WITH STENT TO OM BRANCH  . Shingles   . Squamous cell carcinoma ([place])   . Tobacco abuse   . Unspecified essential hypertension   . Unspecified hypothyroidism     PAST SURGICAL HISTORY: Past Surgical History:  Procedure Laterality Date  . CATARACT SURGERY  12/10  . CESAREAN SECTION    . CORONARY ANGIOPLASTY WITH STENT PLACEMENT    . TONSILLECTOMY    . VESICOVAGINAL FISTULA CLOSURE W/ TAH      FAMILY HISTORY: Family History  Problem  Relation Age of Onset  . Hypertension Mother   . Heart failure Father   . Aneurysm Brother     THORACIC/ABD ANEURYSM  . Prostate cancer Brother 48  . Multiple sclerosis Neg Hx   . Fibromyalgia Sister   . Brain cancer Maternal Grandmother 70    SOCIAL HISTORY: Social History   Social History  . Marital status: Married    Spouse name: N/A  . Number of children: 2  . Years of education: 14   Occupational History  . [employer]    Social History Main Topics  . Smoking status: Former Smoker    Packs/day: 1.00    Quit date: 12/16/2015  . Smokeless tobacco: Never Used  . Alcohol use 0.0 oz/week     Comment: OCCASIONAL ALCOHOL USE.  . Drug use: No  . Sexual activity: Yes   Other Topics Concern  . Not on file   Social History Narrative   The patient lives with husband. 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    Patient drinks occasionally, does not chew tobacco, does not use recreational drugs. She does drink occasional caffeine. She does wear her seatbelt. She does wear bike helmet riding a bike. She does exercise 3 times a week. She does not wear hearing aids or dentures. There is a smoke alarm in her home. There are no firearms in her home. She feels safe in her relationship. She has never experienced physical abuse.   Patient sleeps 7-8 hours a night.   Patient drinks 2 cups of caffeine daily.   Patient is right handed.      PHYSICAL EXAM  Vitals:   03/10/16 0900  BP: 140/71  Pulse: 74  Weight: 194 lb 3.2 oz (88.1 kg)  Height: 5' 6.5" (1.689 m)   Body mass index is 30.88 kg/m. MMSE - Mini Mental State Exam 03/10/2016 03/10/2015  Orientation to time 5 5  Orientation to Place 5 5  Registration 3 3  Attention/ Calculation 5 5  Recall 2 3  Language- name 2 objects 2 2  Language- repeat 1 1  Language- follow 3 step command 3 3  Language- read & follow direction 1 1  Write a sentence 1 1  Copy design 1 1  Total score 29 30  Generalized: Well developed, in no acute distress   Neurological examination  Mentation: Alert oriented to time, place, history taking. Follows all commands speech and language fluent Cranial nerve II-XII: Pupils were equal round reactive to light. Extraocular movements were full, visual field were full on confrontational test. Facial sensation and strength were normal. Uvula tongue midline. Head turning and shoulder shrug  were normal and symmetric. Motor: The motor testing reveals 5 over 5 strength of all 4 extremities. Good symmetric motor tone is noted throughout.  Sensory: Sensory testing is intact to soft touch on all 4 extremities. No evidence of extinction is noted.  Coordination: Cerebellar testing reveals good finger-nose-finger and heel-to-shin bilaterally.  Gait and station: Gait is normal. Tandem gait is unsteady. Romberg is negative. No drift is seen.  Reflexes: Deep tendon reflexes are symmetric and normal bilaterally.   DIAGNOSTIC DATA (LABS, IMAGING, TESTING) - I reviewed patient records, labs, notes, testing and imaging myself where available.  Lab Results  Component Value Date   WBC 10.3 09/11/2015   HGB 11.2 (L) 12/26/2013   HCT 39.0 09/11/2015   MCV 95 09/11/2015   PLT 243 09/11/2015      Component Value Date/Time   NA 139 09/11/2015 1006   K 4.3 09/11/2015 1006   CL 95 (L) 09/11/2015 1006   CO2 28 09/11/2015 1006   GLUCOSE 134 (H) 09/11/2015 1006   GLUCOSE 125 (H) 12/26/2013 2015   BUN 14 09/11/2015 1006   CREATININE 0.75 09/11/2015 1006   CALCIUM 9.9 09/11/2015 1006   PROT 6.8 09/11/2015 1006   ALBUMIN 4.4 09/11/2015 1006   AST 12 09/11/2015 1006   ALT 15 09/11/2015 1006   ALKPHOS 79 09/11/2015 1006   BILITOT <0.2 09/11/2015 1006   GFRNONAA 86 09/11/2015 1006   GFRAA 99 09/11/2015 1006    Lab Results  Component Value Date   HGBA1C 7.4 02/10/2016    Lab Results  Component Value Date   TSH 1.78 10/14/2015      ASSESSMENT AND PLAN 60  y.o. year old female  has a past medical history of Basal cell carcinoma; Cancer ([place]); DM (diabetes mellitus) ([place]); Goiter, unspecified; History of colonic polyps (2012); Multiple sclerosis ([place]); Non-ST elevated myocardial infarction (non-STEMI) ([place]); SCCA (squamous cell carcinoma) of skin; SEMI (subendocardial myocardial infarction) ([place]) (09/29/09); Shingles; Squamous cell carcinoma ([place]); Tobacco abuse; Unspecified essential hypertension; and Unspecified hypothyroidism. here with:  1. Multiple sclerosis 2. Memory disturbance 3. Excessive daytime sleepiness  The patient will continue on Tecfidera. I will check blood work today. We'll also repeat an MRI of the brain to look for any acute changes. The patient is reporting cognition changes. MMSE 29/30. Blood work in the past has been unremarkable. Patient does report excessive daytime sleepiness. She does have elevated BMI but denies snoring or witnessed apneas. I will refer the patient for a sleep consult for potential sleep study to rule out obstructive sleep apnea. Patient advised that if her symptoms worsen or she develops any new symptoms she should let us know. Follow-up in 6 months with Dr. Jannifer Franklin or sooner if needed.    Ward Givens, MSN, NP-C 03/10/2016, 9:23 AM [facility] [address] [place],  [address] [phone]

## 2016-03-11 ENCOUNTER — Other Ambulatory Visit: Payer: Self-pay | Admitting: *Deleted

## 2016-03-11 DIAGNOSIS — R413 Other amnesia: Secondary | ICD-10-CM

## 2016-03-11 LAB — COMPREHENSIVE METABOLIC PANEL
ALT: 18 IU/L (ref 0–32)
AST: 17 IU/L (ref 0–40)
Albumin/Globulin Ratio: 1.6 (ref 1.2–2.2)
Albumin: 4.4 g/dL (ref 3.6–4.8)
Alkaline Phosphatase: 63 IU/L (ref 39–117)
BUN/Creatinine Ratio: 17 (ref 12–28)
BUN: 15 mg/dL (ref 8–27)
Bilirubin Total: <0.2 mg/dL (ref 0.0–1.2)
CO2: 28 mmol/L (ref 18–29)
Calcium: 10.2 mg/dL (ref 8.7–10.3)
Chloride: 96 mmol/L (ref 96–106)
Creatinine, Ser: 0.86 mg/dL (ref 0.57–1.00)
GFR calc Af Amer: 84 mL/min/{1.73_m2} (ref >59)
GFR calc non Af Amer: 73 mL/min/{1.73_m2} (ref >59)
Globulin, Total: 2.7 g/dL (ref 1.5–4.5)
Glucose: 239 mg/dL — ABNORMAL HIGH (ref 65–99)
Potassium: 4.3 mmol/L (ref 3.5–5.2)
Sodium: 139 mmol/L (ref 134–144)
Total Protein: 7.1 g/dL (ref 6.0–8.5)

## 2016-03-11 LAB — CBC WITH DIFFERENTIAL/PLATELET
Basophils Absolute: 0 10*3/uL (ref 0.0–0.2)
Basos: 0 %
EOS (ABSOLUTE): 0.2 10*3/uL (ref 0.0–0.4)
Eos: 2 %
Hematocrit: 39.1 % (ref 34.0–46.6)
Hemoglobin: 12.8 g/dL (ref 11.1–15.9)
Immature Grans (Abs): 0 10*3/uL (ref 0.0–0.1)
Immature Granulocytes: 0 %
Lymphocytes Absolute: 0.6 10*3/uL — ABNORMAL LOW (ref 0.7–3.1)
Lymphs: 7 %
MCH: 31.8 pg (ref 26.6–33.0)
MCHC: 32.7 g/dL (ref 31.5–35.7)
MCV: 97 fL (ref 79–97)
Monocytes Absolute: 0.5 10*3/uL (ref 0.1–0.9)
Monocytes: 6 %
Neutrophils Absolute: 7.2 10*3/uL — ABNORMAL HIGH (ref 1.4–7.0)
Neutrophils: 85 %
Platelets: 216 10*3/uL (ref 150–379)
RBC: 4.02 x10E6/uL (ref 3.77–5.28)
RDW: 13.2 % (ref 12.3–15.4)
WBC: 8.6 10*3/uL (ref 3.4–10.8)

## 2016-03-12 NOTE — Telephone Encounter (Signed)
Dr. Loletha Carrow reviewed records and has accepted patient. Dr. Loletha Carrow states that patient is not due for next colon until 12/2020 unless she has a family hx(parents or sibling) of colon or rectal cancer. I spoke to patient and she states no family hx and she says that she is not having any problems. I informed her that we will contact her when time to schedule her colonoscopy and for her to call our office if she needs anything in the meantime.

## 2016-03-18 ENCOUNTER — Encounter: Payer: Self-pay | Admitting: Cardiovascular Disease

## 2016-03-18 MED ORDER — ROSUVASTATIN CALCIUM 10 MG PO TABS: 10.0000 mg | ORAL_TABLET | Freq: Every day | ORAL | 3 refills | Status: DC

## 2016-03-18 MED ORDER — PRASUGREL HCL 10 MG PO TABS: 10.0000 mg | ORAL_TABLET | Freq: Every day | ORAL | 3 refills | Status: DC

## 2016-03-18 MED ORDER — LOSARTAN POTASSIUM-HCTZ 100-25 MG PO TABS: 1.0000 | ORAL_TABLET | Freq: Every day | ORAL | 3 refills | Status: DC

## 2016-03-18 NOTE — Telephone Encounter (Signed)
Sent refills as patient has requested.

## 2016-03-22 ENCOUNTER — Telehealth: Payer: Self-pay | Admitting: Adult Health

## 2016-03-22 NOTE — Telephone Encounter (Signed)
I called and completed a pure. The approval number is [id number]. This is good for 45 days. Expires 05/06/2016.

## 2016-03-23 NOTE — Telephone Encounter (Signed)
Noted, thank you

## 2016-03-24 ENCOUNTER — Encounter: Payer: Self-pay | Admitting: Family Medicine

## 2016-03-26 ENCOUNTER — Other Ambulatory Visit: Payer: Self-pay | Admitting: *Deleted

## 2016-03-26 MED ORDER — GLIMEPIRIDE 2 MG PO TABS: 2.0000 mg | ORAL_TABLET | Freq: Two times a day (BID) | ORAL | 2 refills | Status: DC

## 2016-03-26 NOTE — Telephone Encounter (Signed)
amaryl 2 mg increased to 2 times daily per Dr Raoul Pitch. Rx sent to pharmacy.

## 2016-04-01 ENCOUNTER — Inpatient Hospital Stay: Admission: RE | Admit: 2016-04-01 | Payer: 59 | Source: Ambulatory Visit

## 2016-04-06 ENCOUNTER — Encounter: Payer: Self-pay | Admitting: Neurology

## 2016-04-07 MED ORDER — GABAPENTIN 600 MG PO TABS: 600.0000 mg | ORAL_TABLET | Freq: Two times a day (BID) | ORAL | 0 refills | Status: DC

## 2016-04-11 ENCOUNTER — Ambulatory Visit
Admission: RE | Admit: 2016-04-11 | Discharge: 2016-04-11 | Disposition: A | Discharge status: Home / Self Care | Payer: 59 | Source: Ambulatory Visit | Attending: Adult Health | Admitting: Adult Health

## 2016-04-11 DIAGNOSIS — G35 Multiple sclerosis: Secondary | ICD-10-CM

## 2016-04-11 MED ORDER — GADOBENATE DIMEGLUMINE 529 MG/ML IV SOLN
18.0000 mL | Freq: Once | INTRAVENOUS | Status: AC | PRN
  Administered 2016-04-11: 18 mL via INTRAVENOUS

## 2016-04-13 ENCOUNTER — Telehealth: Payer: Self-pay

## 2016-04-13 NOTE — Telephone Encounter (Signed)
I called patient and she is aware of results.

## 2016-04-13 NOTE — Telephone Encounter (Signed)
-----   Message from Ward Givens, NP sent at 04/12/2016  5:54 PM EDT ----- No change. Please call patient

## 2016-05-12 ENCOUNTER — Encounter: Payer: Self-pay | Admitting: Family Medicine

## 2016-05-12 ENCOUNTER — Ambulatory Visit (INDEPENDENT_AMBULATORY_CARE_PROVIDER_SITE_OTHER): Payer: 59 | Admitting: Family Medicine

## 2016-05-12 VITALS — BP 139/87 | HR 70 | Temp 98.0°F | Resp 20 | Ht 67.0 in | Wt 193.5 lb

## 2016-05-12 DIAGNOSIS — E1121 Type 2 diabetes mellitus with diabetic nephropathy: Secondary | ICD-10-CM

## 2016-05-12 DIAGNOSIS — Z23 Encounter for immunization: Secondary | ICD-10-CM | POA: Diagnosis not present

## 2016-05-12 DIAGNOSIS — I10 Essential (primary) hypertension: Secondary | ICD-10-CM

## 2016-05-12 DIAGNOSIS — Z6830 Body mass index (BMI) 30.0-30.9, adult: Secondary | ICD-10-CM

## 2016-05-12 MED ORDER — HYDROCHLOROTHIAZIDE 25 MG PO TABS: 25.0000 mg | ORAL_TABLET | Freq: Every day | ORAL | 1 refills | Status: DC

## 2016-05-12 NOTE — Progress Notes (Signed)
Patient ID: Daisy Bennett, female   DOB: 01/23/1956, 60 y.o.   MRN: [id number]   Subjective:    Patient ID: Daisy Bennett, female    DOB: [birth year], 60 y.o.   MRN: [id number]  HPI  Diabetes/BMI30: Patient presents for routine scheduled diabetes follow-up. Last A1c was 6.4--> 7.1--> 7.4. Patient was not able to exercise secondary to knee problems and right shin SCC excision. Amaryl had been restarted at 2 mg daily. She feels her sugars are still higher than she desires, highest she had seen was 140. She denies return of dizziness, which was one of the reasons she was removed from [facility], but at that time she was seeing lower end fasting sugars than she has been now.  She continues to take the metformin 1000 mg twice a day, and tolerating well. She denies worsening neuropathy. She denies any no healing wounds.  She is on gabapentin 600 mg BID. - POCT HgB A1C--> 7.1--> 7.4 > 6.5 today - PSV 23 given 10/2015, Prevnar due 09/2016 - foot exam 10/14/2015 - Eye exam 09/22/2015 - Microalbumin: on an ARB - flu shot administered today 05/12/2016  Hypertension: She reports compliance with her medication regimen of Norvasc 10 mg, Coreg 6.25mg  BID, HCTZ 25 mg QD, Losartan/HCTZ 100-25. She has started walking again. H/O CAD, NSTEMI, post stent 2011 ([facility])-collateralized RCA. Pt denies CP, DOE, or LE edema. Pt is on BB, statin (low dose), ACE, fenofibrate, ASA 325. She follows with Dr. Johnsie Cancel, cardiology.    Immunization History  Administered Date(s) Administered  . Influenza,inj,Quad PF,36+ Mos 04/16/2015  . Pneumococcal Polysaccharide-23 10/14/2015  . Tdap 12/26/2013     Past Medical History:  Diagnosis Date  . Basal cell carcinoma   . Cancer ([place])    basal and squamous cell skin cancer  . DM (diabetes mellitus) ([place])   . Goiter, unspecified   . History of colonic polyps 2012  . Multiple sclerosis ([place])   . Non-ST elevated myocardial infarction (non-STEMI) ([place])   . SCCA (squamous cell  carcinoma) of skin    right shin  . SEMI (subendocardial myocardial infarction) ([place]) 09/29/09   TOTAL RCA WITH STENT TO OM BRANCH  . Shingles   . Squamous cell carcinoma   . Tobacco abuse   . Unspecified essential hypertension   . Unspecified hypothyroidism    Allergies  Allergen Reactions  . Betaseron [Interferon Beta-1b]     Increased LFT's  . Doxycycline Other (See Comments)    Thrush,dizziness   Social History   Social History  . Marital status: Married    Spouse name: N/A  . Number of children: 2  . Years of education: 14   Occupational History  . [employer]    Social History Main Topics  . Smoking status: Former Smoker    Packs/day: 1.00    Quit date: 12/16/2015  . Smokeless tobacco: Never Used  . Alcohol use 0.0 oz/week     Comment: OCCASIONAL ALCOHOL USE.  . Drug use: No  . Sexual activity: Yes   Other Topics Concern  . Not on file   Social History Narrative   The patient lives with husband. 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    Patient drinks occasionally, does not chew tobacco, does not use recreational drugs. She does drink occasional caffeine. She does wear her seatbelt. She does wear bike helmet riding a bike. She does exercise 3 times a week. She does not wear hearing aids or  dentures. There is a smoke alarm in her home. There are no firearms in her home. She feels safe in her relationship. She has never experienced physical abuse.   Patient sleeps 7-8 hours a night.   Patient drinks 2 cups of caffeine daily.   Patient is right handed.    Review of Systems Negative, with the exception of above mentioned in HPI     Objective:   Physical Exam BP 139/87 (BP Location: Right Arm, Patient Position: Sitting, Cuff Size: Normal)   Pulse 70   Temp 98 F (36.7 C)   Resp 20   Ht 5\' 7"  (1.702 m)   Wt 193 lb 8 oz (87.8 kg)   SpO2 96%   BMI 30.31 kg/m  Gen: Afebrile. No acute distress. Nontoxic in appearance,  well-developed, well-nourished, Caucasian female. Very pleasant. HENT: AT. [place]. MMM, no oral lesions.  Eyes:Pupils Equal Round Reactive to light, Extraocular movements intact,  Conjunctiva without redness, discharge or icterus. CV: RRR , No edema, +2/4 P posterior tibialis pulses Chest: CTAB, no wheeze or crackles Skin: WWW, skin intact. Abrasion left foot healing well.  Neuro:  Normal gait. PERLA. EOMi. Alert. Oriented x3  Psych: Normal affect, dress and demeanor. Normal speech. Normal thought content and judgment.      Assessment & Plan:  Diabetes mellitus without complication (HCC)/BMI 30 - improved with increase dose last visit, responded well.  - POCT HgB A1C--> 7.1--> 7.4--. 6.5,  goal for her is approximately 6.5-7.  - continue metformin 1000 mg BID - continue amaryl 2 mg daily.  - increase exercise regimen and continue dietary modification.  - PSV 23 given 10/2015, Prevnar due 09/2016 - foot exam 10/14/2015 - Eye exam 09/22/2015 - Microalbumin: on an ARB - flu shot administered today 05/12/2016  Hypertension:  - Stable today, but borderline.  - low salt, increase exercise.  - Continue to monitor at home if above 140/90 will need to see sooner and discuss.  - refills provided.     Electronically Signed by: Howard Pouch, DO [facility]

## 2016-05-12 NOTE — Patient Instructions (Signed)
Your a1c looks good today. It is 6.5, very happy with this number.  Watch diet, exercise. Low salt.  Follow up in end of January. Watch those feet.

## 2016-05-13 LAB — POCT GLYCOSYLATED HEMOGLOBIN (HGB A1C): Hemoglobin A1C: 6.5

## 2016-05-14 ENCOUNTER — Ambulatory Visit: Payer: 59 | Admitting: Gastroenterology

## 2016-05-17 ENCOUNTER — Encounter (HOSPITAL_COMMUNITY): Payer: Self-pay | Admitting: Emergency Medicine

## 2016-05-17 DIAGNOSIS — Z85828 Personal history of other malignant neoplasm of skin: Secondary | ICD-10-CM | POA: Insufficient documentation

## 2016-05-17 DIAGNOSIS — E039 Hypothyroidism, unspecified: Secondary | ICD-10-CM

## 2016-05-17 DIAGNOSIS — R51 Headache: Secondary | ICD-10-CM

## 2016-05-17 DIAGNOSIS — Z5321 Procedure and treatment not carried out due to patient leaving prior to being seen by health care provider: Secondary | ICD-10-CM

## 2016-05-17 DIAGNOSIS — Z955 Presence of coronary angioplasty implant and graft: Secondary | ICD-10-CM

## 2016-05-17 DIAGNOSIS — W1839XA Other fall on same level, initial encounter: Secondary | ICD-10-CM

## 2016-05-17 DIAGNOSIS — Z87891 Personal history of nicotine dependence: Secondary | ICD-10-CM

## 2016-05-17 DIAGNOSIS — Z7982 Long term (current) use of aspirin: Secondary | ICD-10-CM | POA: Insufficient documentation

## 2016-05-17 DIAGNOSIS — I10 Essential (primary) hypertension: Secondary | ICD-10-CM | POA: Insufficient documentation

## 2016-05-17 DIAGNOSIS — S065X0A Traumatic subdural hemorrhage without loss of consciousness, initial encounter: Secondary | ICD-10-CM | POA: Diagnosis not present

## 2016-05-17 DIAGNOSIS — Y939 Activity, unspecified: Secondary | ICD-10-CM

## 2016-05-17 DIAGNOSIS — Z7984 Long term (current) use of oral hypoglycemic drugs: Secondary | ICD-10-CM | POA: Insufficient documentation

## 2016-05-17 DIAGNOSIS — E119 Type 2 diabetes mellitus without complications: Secondary | ICD-10-CM

## 2016-05-17 DIAGNOSIS — I252 Old myocardial infarction: Secondary | ICD-10-CM

## 2016-05-17 DIAGNOSIS — Y9239 Other specified sports and athletic area as the place of occurrence of the external cause: Secondary | ICD-10-CM

## 2016-05-17 DIAGNOSIS — Y999 Unspecified external cause status: Secondary | ICD-10-CM | POA: Insufficient documentation

## 2016-05-17 NOTE — ED Triage Notes (Signed)
Pt to ED via [facility] st's she fell backwards over a piece of equipment at gym earlier tonight.  St's approx 9pm she developed a severe headache.  Pt denies LOC at time of fall

## 2016-05-18 ENCOUNTER — Emergency Department (HOSPITAL_COMMUNITY)
Admission: EM | Admit: 2016-05-18 | Discharge: 2016-05-18 | Disposition: A | Discharge status: Home / Self Care | Payer: 59 | Source: Home / Self Care

## 2016-05-18 ENCOUNTER — Ambulatory Visit (INDEPENDENT_AMBULATORY_CARE_PROVIDER_SITE_OTHER): Payer: 59 | Admitting: Family Medicine

## 2016-05-18 ENCOUNTER — Encounter: Payer: Self-pay | Admitting: Family Medicine

## 2016-05-18 ENCOUNTER — Emergency Department (HOSPITAL_BASED_OUTPATIENT_CLINIC_OR_DEPARTMENT_OTHER)
Admit: 2016-05-18 | Discharge: 2016-05-18 | Disposition: A | Discharge status: Home / Self Care | Payer: 59 | Attending: Family Medicine | Admitting: Family Medicine

## 2016-05-18 ENCOUNTER — Encounter (HOSPITAL_BASED_OUTPATIENT_CLINIC_OR_DEPARTMENT_OTHER): Payer: Self-pay | Admitting: Emergency Medicine

## 2016-05-18 ENCOUNTER — Inpatient Hospital Stay (HOSPITAL_BASED_OUTPATIENT_CLINIC_OR_DEPARTMENT_OTHER)
Admission: EM | Admit: 2016-05-18 | Discharge: 2016-05-20 | DRG: 087 | Disposition: A | Discharge status: Home / Self Care | Payer: 59 | Attending: Family Medicine | Admitting: Family Medicine

## 2016-05-18 VITALS — BP 153/84 | HR 76 | Temp 98.0°F | Resp 20 | Wt 191.2 lb

## 2016-05-18 DIAGNOSIS — G44301 Post-traumatic headache, unspecified, intractable: Secondary | ICD-10-CM

## 2016-05-18 DIAGNOSIS — Z7902 Long term (current) use of antithrombotics/antiplatelets: Secondary | ICD-10-CM | POA: Diagnosis not present

## 2016-05-18 DIAGNOSIS — Z79899 Other long term (current) drug therapy: Secondary | ICD-10-CM

## 2016-05-18 DIAGNOSIS — I2510 Atherosclerotic heart disease of native coronary artery without angina pectoris: Secondary | ICD-10-CM | POA: Diagnosis present

## 2016-05-18 DIAGNOSIS — D6869 Other thrombophilia: Secondary | ICD-10-CM | POA: Insufficient documentation

## 2016-05-18 DIAGNOSIS — I6200 Nontraumatic subdural hemorrhage, unspecified: Secondary | ICD-10-CM

## 2016-05-18 DIAGNOSIS — I252 Old myocardial infarction: Secondary | ICD-10-CM

## 2016-05-18 DIAGNOSIS — S0990XA Unspecified injury of head, initial encounter: Secondary | ICD-10-CM | POA: Diagnosis present

## 2016-05-18 DIAGNOSIS — S065X9A Traumatic subdural hemorrhage with loss of consciousness of unspecified duration, initial encounter: Secondary | ICD-10-CM | POA: Diagnosis present

## 2016-05-18 DIAGNOSIS — W19XXXA Unspecified fall, initial encounter: Secondary | ICD-10-CM

## 2016-05-18 DIAGNOSIS — E876 Hypokalemia: Secondary | ICD-10-CM | POA: Diagnosis not present

## 2016-05-18 DIAGNOSIS — I10 Essential (primary) hypertension: Secondary | ICD-10-CM | POA: Diagnosis not present

## 2016-05-18 DIAGNOSIS — G35 Multiple sclerosis: Secondary | ICD-10-CM

## 2016-05-18 DIAGNOSIS — E039 Hypothyroidism, unspecified: Secondary | ICD-10-CM | POA: Diagnosis not present

## 2016-05-18 DIAGNOSIS — S065X0A Traumatic subdural hemorrhage without loss of consciousness, initial encounter: Principal | ICD-10-CM | POA: Diagnosis present

## 2016-05-18 DIAGNOSIS — Z8249 Family history of ischemic heart disease and other diseases of the circulatory system: Secondary | ICD-10-CM

## 2016-05-18 DIAGNOSIS — Z7982 Long term (current) use of aspirin: Secondary | ICD-10-CM

## 2016-05-18 DIAGNOSIS — G35D Multiple sclerosis, unspecified: Secondary | ICD-10-CM | POA: Diagnosis present

## 2016-05-18 DIAGNOSIS — Z87891 Personal history of nicotine dependence: Secondary | ICD-10-CM

## 2016-05-18 DIAGNOSIS — Z85828 Personal history of other malignant neoplasm of skin: Secondary | ICD-10-CM

## 2016-05-18 DIAGNOSIS — S065XAA Traumatic subdural hemorrhage with loss of consciousness status unknown, initial encounter: Secondary | ICD-10-CM

## 2016-05-18 DIAGNOSIS — Z7984 Long term (current) use of oral hypoglycemic drugs: Secondary | ICD-10-CM

## 2016-05-18 DIAGNOSIS — E119 Type 2 diabetes mellitus without complications: Secondary | ICD-10-CM

## 2016-05-18 DIAGNOSIS — F329 Major depressive disorder, single episode, unspecified: Secondary | ICD-10-CM | POA: Diagnosis present

## 2016-05-18 DIAGNOSIS — E782 Mixed hyperlipidemia: Secondary | ICD-10-CM

## 2016-05-18 DIAGNOSIS — Y9239 Other specified sports and athletic area as the place of occurrence of the external cause: Secondary | ICD-10-CM

## 2016-05-18 DIAGNOSIS — Z8679 Personal history of other diseases of the circulatory system: Secondary | ICD-10-CM | POA: Diagnosis present

## 2016-05-18 DIAGNOSIS — W010XXA Fall on same level from slipping, tripping and stumbling without subsequent striking against object, initial encounter: Secondary | ICD-10-CM | POA: Diagnosis present

## 2016-05-18 DIAGNOSIS — Z808 Family history of malignant neoplasm of other organs or systems: Secondary | ICD-10-CM

## 2016-05-18 DIAGNOSIS — Z955 Presence of coronary angioplasty implant and graft: Secondary | ICD-10-CM

## 2016-05-18 DIAGNOSIS — E049 Nontoxic goiter, unspecified: Secondary | ICD-10-CM | POA: Diagnosis present

## 2016-05-18 HISTORY — DX: Personal history of other diseases of the circulatory system: Z86.79

## 2016-05-18 LAB — CBC WITH DIFFERENTIAL/PLATELET
Basophils Absolute: 0 10*3/uL (ref 0.0–0.1)
Basophils Relative: 0 %
Eosinophils Absolute: 0.2 10*3/uL (ref 0.0–0.7)
Eosinophils Relative: 2 %
HCT: 36.2 % (ref 36.0–46.0)
Hemoglobin: 12.8 g/dL (ref 12.0–15.0)
Lymphocytes Relative: 5 %
Lymphs Abs: 0.6 10*3/uL — ABNORMAL LOW (ref 0.7–4.0)
MCH: 32.9 pg (ref 26.0–34.0)
MCHC: 35.4 g/dL (ref 30.0–36.0)
MCV: 93.1 fL (ref 78.0–100.0)
Monocytes Absolute: 1 10*3/uL (ref 0.1–1.0)
Monocytes Relative: 8 %
Neutro Abs: 11.2 10*3/uL — ABNORMAL HIGH (ref 1.7–7.7)
Neutrophils Relative %: 85 %
Platelets: 201 10*3/uL (ref 150–400)
RBC: 3.89 MIL/uL (ref 3.87–5.11)
RDW: 12.9 % (ref 11.5–15.5)
WBC: 13.1 10*3/uL — ABNORMAL HIGH (ref 4.0–10.5)

## 2016-05-18 LAB — BASIC METABOLIC PANEL
Anion gap: 10 (ref 5–15)
BUN: 8 mg/dL (ref 6–20)
CO2: 27 mmol/L (ref 22–32)
Calcium: 9.7 mg/dL (ref 8.9–10.3)
Chloride: 96 mmol/L — ABNORMAL LOW (ref 101–111)
Creatinine, Ser: 0.68 mg/dL (ref 0.44–1.00)
GFR calc Af Amer: >60 mL/min (ref >60)
GFR calc non Af Amer: >60 mL/min (ref >60)
Glucose, Bld: 113 mg/dL — ABNORMAL HIGH (ref 65–99)
Potassium: 3.1 mmol/L — ABNORMAL LOW (ref 3.5–5.1)
Sodium: 133 mmol/L — ABNORMAL LOW (ref 135–145)

## 2016-05-18 LAB — PROTIME-INR
INR: 0.95
Prothrombin Time: 12.7 seconds (ref 11.4–15.2)

## 2016-05-18 MED ORDER — ACETAMINOPHEN 325 MG PO TABS
650.0000 mg | ORAL_TABLET | Freq: Four times a day (QID) | ORAL | Status: DC | PRN
  Administered 2016-05-18 – 2016-05-19 (×2): 650 mg via ORAL
  Filled 2016-05-18 (×2): qty 2

## 2016-05-18 MED ORDER — OMEGA-3-ACID ETHYL ESTERS 1 G PO CAPS
1.0000 g | ORAL_CAPSULE | Freq: Two times a day (BID) | ORAL | Status: DC
  Administered 2016-05-19 – 2016-05-20 (×3): 1 g via ORAL
  Filled 2016-05-18 (×3): qty 1

## 2016-05-18 MED ORDER — FENOFIBRATE 160 MG PO TABS
160.0000 mg | ORAL_TABLET | Freq: Every day | ORAL | Status: DC
  Administered 2016-05-19 – 2016-05-20 (×2): 160 mg via ORAL
  Filled 2016-05-18 (×2): qty 1

## 2016-05-18 MED ORDER — DIMETHYL FUMARATE 240 MG PO CPDR
240.0000 mg | DELAYED_RELEASE_CAPSULE | Freq: Two times a day (BID) | ORAL | Status: DC
  Administered 2016-05-19 – 2016-05-20 (×2): 240 mg via ORAL
  Filled 2016-05-18 (×2): qty 1

## 2016-05-18 MED ORDER — OXYCODONE-ACETAMINOPHEN 5-325 MG PO TABS
1.0000 | ORAL_TABLET | ORAL | Status: DC | PRN
Start: 2016-05-18 — End: 2016-05-20
  Administered 2016-05-19 – 2016-05-20 (×6): 1 via ORAL
  Filled 2016-05-18 (×7): qty 1

## 2016-05-18 MED ORDER — HYDROCHLOROTHIAZIDE 25 MG PO TABS: 25.0000 mg | ORAL_TABLET | Freq: Every day | ORAL | Status: DC

## 2016-05-18 MED ORDER — SODIUM CHLORIDE 0.9 % IV SOLN
INTRAVENOUS | Status: DC
  Administered 2016-05-18: 22:00:00 via INTRAVENOUS

## 2016-05-18 MED ORDER — ZOLPIDEM TARTRATE 5 MG PO TABS: 5.0000 mg | ORAL_TABLET | Freq: Every evening | ORAL | Status: DC | PRN

## 2016-05-18 MED ORDER — OMEGA-3 FATTY ACIDS 1000 MG PO CAPS: 1.0000 g | ORAL_CAPSULE | Freq: Every day | ORAL | Status: DC

## 2016-05-18 MED ORDER — ONDANSETRON HCL 4 MG/2ML IJ SOLN: 4.0000 mg | Freq: Three times a day (TID) | INTRAMUSCULAR | Status: DC | PRN

## 2016-05-18 MED ORDER — POTASSIUM CHLORIDE 20 MEQ/15ML (10%) PO SOLN
40.0000 meq | Freq: Once | ORAL | Status: AC
  Administered 2016-05-18: 40 meq via ORAL
  Filled 2016-05-18: qty 30

## 2016-05-18 MED ORDER — MULTIVITAMINS PO CAPS: 1.0000 | ORAL_CAPSULE | Freq: Every day | ORAL | Status: DC

## 2016-05-18 MED ORDER — ADULT MULTIVITAMIN W/MINERALS CH
1.0000 | ORAL_TABLET | Freq: Every day | ORAL | Status: DC
  Administered 2016-05-19 – 2016-05-20 (×2): 1 via ORAL
  Filled 2016-05-18 (×2): qty 1

## 2016-05-18 MED ORDER — CARVEDILOL 6.25 MG PO TABS
6.2500 mg | ORAL_TABLET | Freq: Two times a day (BID) | ORAL | Status: DC
  Administered 2016-05-19 – 2016-05-20 (×4): 6.25 mg via ORAL
  Filled 2016-05-18 (×4): qty 1

## 2016-05-18 MED ORDER — NITROGLYCERIN 0.4 MG SL SUBL: 0.4000 mg | SUBLINGUAL_TABLET | SUBLINGUAL | Status: DC | PRN

## 2016-05-18 MED ORDER — SODIUM CHLORIDE 0.9% FLUSH
3.0000 mL | Freq: Two times a day (BID) | INTRAVENOUS | Status: DC
  Administered 2016-05-19 – 2016-05-20 (×2): 3 mL via INTRAVENOUS

## 2016-05-18 MED ORDER — LOSARTAN POTASSIUM-HCTZ 100-25 MG PO TABS: 1.0000 | ORAL_TABLET | Freq: Every day | ORAL | Status: DC

## 2016-05-18 MED ORDER — FERROUS SULFATE 325 (65 FE) MG PO TABS
325.0000 mg | ORAL_TABLET | Freq: Every day | ORAL | Status: DC
  Administered 2016-05-19 – 2016-05-20 (×2): 325 mg via ORAL
  Filled 2016-05-18 (×2): qty 1

## 2016-05-18 MED ORDER — PANTOPRAZOLE SODIUM 40 MG PO TBEC
40.0000 mg | DELAYED_RELEASE_TABLET | Freq: Every day | ORAL | Status: DC
  Administered 2016-05-19 – 2016-05-20 (×2): 40 mg via ORAL
  Filled 2016-05-18 (×2): qty 1

## 2016-05-18 MED ORDER — VITAMIN D 1000 UNITS PO TABS
1000.0000 [IU] | ORAL_TABLET | Freq: Every day | ORAL | Status: DC
  Administered 2016-05-19 – 2016-05-20 (×2): 1000 [IU] via ORAL
  Filled 2016-05-18 (×2): qty 1

## 2016-05-18 MED ORDER — HYDROCHLOROTHIAZIDE 25 MG PO TABS
25.0000 mg | ORAL_TABLET | Freq: Every day | ORAL | Status: DC
  Administered 2016-05-19 – 2016-05-20 (×2): 25 mg via ORAL
  Filled 2016-05-18 (×2): qty 1

## 2016-05-18 MED ORDER — AMLODIPINE BESYLATE 10 MG PO TABS
10.0000 mg | ORAL_TABLET | Freq: Every day | ORAL | Status: DC
  Administered 2016-05-19 – 2016-05-20 (×2): 10 mg via ORAL
  Filled 2016-05-18 (×2): qty 1

## 2016-05-18 MED ORDER — VENLAFAXINE HCL ER 75 MG PO CP24
75.0000 mg | ORAL_CAPSULE | Freq: Every day | ORAL | Status: DC
  Administered 2016-05-19 – 2016-05-20 (×2): 75 mg via ORAL
  Filled 2016-05-18 (×2): qty 1

## 2016-05-18 MED ORDER — LOSARTAN POTASSIUM 50 MG PO TABS
100.0000 mg | ORAL_TABLET | Freq: Every day | ORAL | Status: DC
  Administered 2016-05-19 – 2016-05-20 (×2): 100 mg via ORAL
  Filled 2016-05-18 (×2): qty 2

## 2016-05-18 MED ORDER — IRON 325 (65 FE) MG PO TABS: 1.0000 | ORAL_TABLET | Freq: Every day | ORAL | Status: DC

## 2016-05-18 MED ORDER — ROSUVASTATIN CALCIUM 5 MG PO TABS
10.0000 mg | ORAL_TABLET | Freq: Every day | ORAL | Status: DC
  Administered 2016-05-19: 10 mg via ORAL
  Filled 2016-05-18: qty 2

## 2016-05-18 MED ORDER — GABAPENTIN 600 MG PO TABS
600.0000 mg | ORAL_TABLET | Freq: Two times a day (BID) | ORAL | Status: DC
  Administered 2016-05-18 – 2016-05-20 (×4): 600 mg via ORAL
  Filled 2016-05-18 (×4): qty 1

## 2016-05-18 MED ORDER — CINNAMON 500 MG PO CAPS: 2000.0000 mg | ORAL_CAPSULE | Freq: Every day | ORAL | Status: DC

## 2016-05-18 MED ORDER — LEVOTHYROXINE SODIUM 100 MCG PO TABS
100.0000 ug | ORAL_TABLET | Freq: Every day | ORAL | Status: DC
  Administered 2016-05-19 – 2016-05-20 (×2): 100 ug via ORAL
  Filled 2016-05-18 (×2): qty 1

## 2016-05-18 MED ORDER — AMITRIPTYLINE HCL 25 MG PO TABS
50.0000 mg | ORAL_TABLET | Freq: Every day | ORAL | Status: DC
  Administered 2016-05-18 – 2016-05-19 (×2): 50 mg via ORAL
  Filled 2016-05-18 (×2): qty 2

## 2016-05-18 MED ORDER — MUPIROCIN 2 % EX OINT
TOPICAL_OINTMENT | Freq: Every day | CUTANEOUS | Status: DC
  Administered 2016-05-19 – 2016-05-20 (×2): via TOPICAL
  Filled 2016-05-18: qty 22

## 2016-05-18 NOTE — Progress Notes (Addendum)
Patient arrived from [facility] to [phone]. Safety precautions and orders reviewed with patient. TELE applied and confirmed. C/o headache 2/10. No other distress noted. On call provider paged. Will continue to monitor.  Ave Filter, RN

## 2016-05-18 NOTE — H&P (Signed)
History and Physical    Daisy Bennett [id number] DOB: 02-28-1956 DOA: 05/18/2016  Referring MD/NP/PA:   PCP: Howard Pouch, DO   Patient coming from:  The patient is coming from home.  At baseline, pt is independent for most of ADL.   Chief Complaint: fall, head jury  HPI: Daisy Bennett is a 60 y.o. female with medical history significant of hypertension, hyperlipidemia, diabetes mellitus, GERD, hypothyroidism, depression, CAD, non-STEMI, multiple sclerosis, skin cancer, who presents with fall and head injury.  Patient states that she tripped her steps and fell while she was doing exercise in gym at all about 6 PM. No prodromal symptoms, such as unilateral weakness, chest pain, dizziness, vision change, hearing loss. Patient states that she developed a severe headache over frontal and occipital area, which is the worst pain in her life. Her headache has gradually improved, currently 2 out of 10 in severity, nonradiating. It is aggravated by moving. Patient denies fever, chills, shortness of breath, cough, nausea, vomiting, diarrhea, abdominal pain, symptoms of UTI.  ED Course: pt was found to have WBC 13.1, INR 0.95, potassium 3.1, creatinine normal, temperature normal, no tachycardia, CT head showed Acute subdural hematoma on the left. This involves the interhemispheric fissure as well as the left hemisphere most notably over the convexity. This measures up to 7 mm in thickness. 3 mm midline shift to the right. Pt is placed on tele bed for obs. Neurosurgeon, Dr. Cyndy Freeze was consulted.   Review of Systems:   General: no fevers, chills, no changes in body weight, has fatigue and HA HEENT: no blurry vision, hearing changes or sore throat Respiratory: no dyspnea, coughing, wheezing CV: no chest pain, no palpitations GI: no nausea, vomiting, abdominal pain, diarrhea, constipation GU: no dysuria, burning on urination, increased urinary frequency, hematuria  Ext: no leg edema Neuro: no  unilateral weakness, numbness, or tingling, no vision change or hearing loss Skin: no rash, no skin tear. MSK: No muscle spasm, no deformity, no limitation of range of movement in spin Heme: No easy bruising.  Travel history: No recent long distant travel.  Allergy:  Allergies  Allergen Reactions  . Betaseron [Interferon Beta-1b]     Increased LFT's  . Doxycycline Other (See Comments)    Thrush,dizziness    Past Medical History:  Diagnosis Date  . Basal cell carcinoma   . Cancer ([place])    basal and squamous cell skin cancer  . DM (diabetes mellitus) ([place])   . Goiter, unspecified   . History of colonic polyps 2012  . Multiple sclerosis ([place])   . Non-ST elevated myocardial infarction (non-STEMI) ([place])   . SCCA (squamous cell carcinoma) of skin    right shin  . SEMI (subendocardial myocardial infarction) ([place]) 09/29/09   TOTAL RCA WITH STENT TO OM BRANCH  . Shingles   . Squamous cell carcinoma   . Tobacco abuse   . Unspecified essential hypertension   . Unspecified hypothyroidism     Past Surgical History:  Procedure Laterality Date  . CATARACT SURGERY  12/10  . CESAREAN SECTION    . CORONARY ANGIOPLASTY WITH STENT PLACEMENT    . TONSILLECTOMY    . VESICOVAGINAL FISTULA CLOSURE W/ TAH      Social History:  reports that she quit smoking about 5 months ago. She smoked 1.00 pack per day. She has never used smokeless tobacco. She reports that she drinks alcohol. She reports that she does not use drugs.  Family History:  Family History  Problem  Relation Age of Onset  . Hypertension Mother   . Heart failure Father   . Aneurysm Brother     THORACIC/ABD ANEURYSM  . Prostate cancer Brother 25  . Fibromyalgia Sister   . Brain cancer Maternal Grandmother 56  . Multiple sclerosis Neg Hx      Prior to Admission medications   Medication Sig Start Date End Date Taking? Authorizing Provider  amitriptyline (ELAVIL) 50 MG tablet Take 1 tablet (50 mg total) by mouth  daily. Patient taking differently: Take 50 mg by mouth at bedtime.  12/24/15   Kathrynn Ducking, MD  amLODipine (NORVASC) 10 MG tablet Take 1 tablet (10 mg total) by mouth daily. 05/19/15   Josue Hector, MD  aspirin EC 325 MG EC tablet Take 325 mg by mouth at bedtime.     Historical Provider, MD  carvedilol (COREG) 6.25 MG tablet Take 1 tablet (6.25 mg total) by mouth 2 (two) times daily with a meal. 06/17/15   Josue Hector, MD  cholecalciferol (VITAMIN D) 1000 units tablet Take 1,000 Units by mouth daily.    Historical Provider, MD  CINNAMON PO Take 2,000 mg by mouth daily.     Historical Provider, MD  Dimethyl Fumarate (TECFIDERA) 240 MG CPDR Take 1 capsule (240 mg total) by mouth 2 (two) times daily. 09/18/15   Kathrynn Ducking, MD  esomeprazole (NEXIUM) 20 MG capsule Take 20 mg by mouth daily at 12 noon.    Historical Provider, MD  fenofibrate 160 MG tablet Take 1 tablet (160 mg total) by mouth daily. 03/02/16   Renee A Kuneff, DO  Ferrous Sulfate (IRON) 325 (65 FE) MG TABS Take 1 tablet by mouth daily.      Historical Provider, MD  fish oil-omega-3 fatty acids 1000 MG capsule Take 1-2 g by mouth daily.     Historical Provider, MD  gabapentin (NEURONTIN) 600 MG tablet Take 1 tablet (600 mg total) by mouth 2 (two) times daily. 04/07/16   Kathrynn Ducking, MD  glimepiride (AMARYL) 2 MG tablet Take 1 tablet (2 mg total) by mouth 2 (two) times daily. 03/26/16   Renee A Kuneff, DO  glucose blood test strip DX:E11.9, monitor fasting BG daily. 04/16/15   Renee A Kuneff, DO  hydrochlorothiazide (HYDRODIURIL) 25 MG tablet Take 1 tablet (25 mg total) by mouth daily. 05/12/16   Renee A Kuneff, DO  levothyroxine (SYNTHROID, LEVOTHROID) 100 MCG tablet Take 1 tablet (100 mcg total) by mouth daily. 10/14/15   Renee A Kuneff, DO  losartan-hydrochlorothiazide (HYZAAR) 100-25 MG tablet Take 1 tablet by mouth daily. 03/18/16   Josue Hector, MD  metFORMIN (GLUCOPHAGE) 1000 MG tablet Take 1 tablet (1,000 mg total) by  mouth 2 (two) times daily with a meal. 02/10/16   Renee A Kuneff, DO  Multiple Vitamin (MULTIVITAMINS PO) Take 1 tablet by mouth at bedtime.     Historical Provider, MD  mupirocin ointment (BACTROBAN) 2 %  05/10/16   Historical Provider, MD  nitroGLYCERIN (NITROSTAT) 0.4 MG SL tablet Place 1 tablet (0.4 mg total) under the tongue every 5 (five) minutes as needed for chest pain. 09/18/14 05/17/16  Josue Hector, MD  prasugrel (EFFIENT) 10 MG TABS tablet Take 1 tablet (10 mg total) by mouth daily. 03/18/16   Josue Hector, MD  rosuvastatin (CRESTOR) 10 MG tablet Take 1 tablet (10 mg total) by mouth daily. 03/18/16   Josue Hector, MD  venlafaxine (EFFEXOR-XR) 75 MG 24 hr capsule Take 75  mg by mouth daily.      Historical Provider, MD    Physical Exam: Vitals:   05/18/16 1700 05/18/16 1800 05/18/16 1930 05/19/16 0131  BP: 136/65 139/67 (!) 152/65 130/70  Pulse: 79 78 78 70  Resp: 19 15 16 16   Temp:   98.5 F (36.9 C) 98.7 F (37.1 C)  TempSrc:   Oral Oral  SpO2: 100% 97% 96% 93%  Weight:   85.3 kg (188 lb 0.8 oz)   Height:   5\' 6"  (1.676 m)    General: Not in acute distress HEENT:       Eyes: PERRL, EOMI, no scleral icterus.       ENT: No discharge from the ears and nose, no pharynx injection, no tonsillar enlargement.        Neck: No JVD, no bruit, no mass felt. Heme: No neck lymph node enlargement. Cardiac: S1/S2, RRR, No murmurs, No gallops or rubs. Respiratory:  No rales, wheezing, rhonchi or rubs. GI: Soft, nondistended, nontender, no rebound pain, no organomegaly, BS present. GU: No hematuria Ext: No pitting leg edema bilaterally. 2+DP/PT pulse bilaterally. Musculoskeletal: No joint deformities, No joint redness or warmth, no limitation of ROM in spin. Skin: No rashes.  Neuro: Alert, oriented X3, cranial nerves II-XII grossly intact, moves all extremities normally. Muscle strength 5/5 in all extremities, sensation to light touch intact. Negative Babinski's sign. Normal finger to  nose test. Psych: Patient is not psychotic, no suicidal or hemocidal ideation.  Labs on Admission: I have personally reviewed following labs and imaging studies  CBC:  Recent Labs Lab 05/18/16 1305  WBC 13.1*  NEUTROABS 11.2*  HGB 12.8  HCT 36.2  MCV 93.1  PLT 123456   Basic Metabolic Panel:  Recent Labs Lab 05/18/16 1305  NA 133*  K 3.1*  CL 96*  CO2 27  GLUCOSE 113*  BUN 8  CREATININE 0.68  CALCIUM 9.7   GFR: Estimated Creatinine Clearance: 81.3 mL/min (by C-G formula based on SCr of 0.68 mg/dL). Liver Function Tests: No results for input(s): AST, ALT, ALKPHOS, BILITOT, PROT, ALBUMIN in the last 168 hours. No results for input(s): LIPASE, AMYLASE in the last 168 hours. No results for input(s): AMMONIA in the last 168 hours. Coagulation Profile:  Recent Labs Lab 05/18/16 1305  INR 0.95   Cardiac Enzymes: No results for input(s): CKTOTAL, CKMB, CKMBINDEX, TROPONINI in the last 168 hours. BNP (last 3 results) No results for input(s): PROBNP in the last 8760 hours. HbA1C: No results for input(s): HGBA1C in the last 72 hours. CBG: No results for input(s): GLUCAP in the last 168 hours. Lipid Profile: No results for input(s): CHOL, HDL, LDLCALC, TRIG, CHOLHDL, LDLDIRECT in the last 72 hours. Thyroid Function Tests: No results for input(s): TSH, T4TOTAL, FREET4, T3FREE, THYROIDAB in the last 72 hours. Anemia Panel: No results for input(s): VITAMINB12, FOLATE, FERRITIN, TIBC, IRON, RETICCTPCT in the last 72 hours. Urine analysis:    Component Value Date/Time   COLORURINE YELLOW 09/29/2009 [place] 09/29/2009 1157   LABSPEC 1.003 (L) 09/29/2009 1157   PHURINE 5.5 09/29/2009 1157   GLUCOSEU NEGATIVE 09/29/2009 1157   HGBUR NEGATIVE 09/29/2009 1157   BILIRUBINUR NEGATIVE 09/29/2009 1157   KETONESUR NEGATIVE 09/29/2009 1157   PROTEINUR NEGATIVE 09/29/2009 1157   UROBILINOGEN 0.2 09/29/2009 1157   NITRITE NEGATIVE 09/29/2009 1157    LEUKOCYTESUR  09/29/2009 1157    NEGATIVE MICROSCOPIC NOT DONE ON URINES WITH NEGATIVE PROTEIN, BLOOD, LEUKOCYTES, NITRITE, OR GLUCOSE <1000 mg/dL.  Sepsis Labs: @LABRCNTIP (procalcitonin:4,lacticidven:4) )No results found for this or any previous visit (from the past 240 hour(s)).   Radiological Exams on Admission: Ct Head Wo Contrast  Addendum Date: 05/18/2016   ADDENDUM REPORT: 05/18/2016 12:23 ADDENDUM: Critical Value/emergent results were called by telephone at the time of interpretation on 05/18/2016 at 12:23 pm to Dr. Howard Pouch , who verbally acknowledged these results. Electronically Signed   By: Franchot Gallo M.D.   On: 05/18/2016 12:23   Result Date: 05/18/2016 CLINICAL DATA:  [place] yesterday. Head injury. On blood thinner. Headache. EXAM: CT HEAD WITHOUT CONTRAST TECHNIQUE: Contiguous axial images were obtained from the base of the skull through the vertex without intravenous contrast. COMPARISON:  MRI head 04/11/2016 FINDINGS: Brain: Acute left subdural hematomas are present. 7 mm thick interhemispheric subdural hematoma anteriorly on the left. Left hemispheric subdural hematoma involving the left temporal lobe, and left frontal and parietal lobe. Much of this is over the convexity as well as some in the temporal lobe. This measures up to 7 mm in thickness in the left frontal convexity and approximately 3 mm in the temporal lobe. 3 mm midline shift towards the right due to mass-effect. Negative for hydrocephalus. No acute infarct or mass. Vascular: No hyperdense vessel or unexpected calcification. Skull: Negative for fracture. Sinuses/Orbits: Mild mucosal thickening left lateral sphenoid sinus. Remaining sinuses clear. Other: None IMPRESSION: Acute subdural hematoma on the left. This involves the interhemispheric fissure as well as the left hemisphere most notably over the convexity. This measures up to 7 mm in thickness. 3 mm midline shift to the right. Electronically Signed: By:  Franchot Gallo M.D. On: 05/18/2016 12:19     EKG: Not done in ED, will get one.   Assessment/Plan Principal Problem:   SDH (subdural hematoma) (HCC) Active Problems:   Hypothyroidism   Diabetes type 2, controlled ([place])   Mixed hyperlipidemia   Multiple sclerosis (HCC)   Essential hypertension   Coronary atherosclerosis   Head injury   Antiplatelet or antithrombotic long-term use   Fall   Hypokalemia   SDH (subdural hematoma) ([place]): as evidenced by Ct-head. There is mild 3 mm midline shift to the right. No focal neurological findings on physical examination. Her headache has improved. Neurosurgeon, Dr. Cyndy Freeze was consulted.  -will place on tele bed -Frequent neuro check -Hold aspirin and Effient -repeat CT-head at 11:00 am -When necessary Tylenol and Percocet for pain -f/u neurosurgeon's recommendations  Hypothyroidism: Last TSH was 1.78 on 10/14/15 -Continue home Synthroid  DM-II: Last A1c 6.5 on 05/13/16, well controled. Patient is taking metformin and Amaryl at home -SSI  HLD: Last LDL was noted on record, TG 726 on 09/30/09 -Continue home medications: Fenofibrate and Crestor  Multiple sclerosis ([place]): stable -continue home Tecfidera  HTN: Blood pressure 161/83 -continue amlodipine, Coreg, HCTZ, Hyzaar -IV hydralazine.  Coronary atherosclerosis: s/p stent. No CP -hold ASA and Effient -Continue Coreg and Crestor  Hypokalemia: K= 3.1 on admission. - Repleted - Check Mg level   DVT ppx: SCD Code Status: Full code Family Communication: Yes, patient's husband at bed side Disposition Plan:  Anticipate discharge back to previous home environment Consults called:  Neurosurgeon, Dr. Cyndy Freeze was consulted Admission status: Obs / tele       Date of Service 05/19/2016    Ivor Costa [facility] Pager [phone]  If 7PM-7AM, please contact night-coverage [email or web address] Password [email or web address] 05/19/2016, 4:15 AM

## 2016-05-18 NOTE — ED Notes (Signed)
Pt states "we're going home." RN encouraged pt to return if worsening symptoms. Pt a/o x 4, ambulatory. In NAD.

## 2016-05-18 NOTE — Progress Notes (Addendum)
Daisy WOHLGEMUTH , 02-Oct-1955, 60 y.o., female MRN: [id number] Patient Care Team    Relationship Specialty Notifications Start End  Ma Hillock, DO PCP - General Family Medicine  03/10/15   Devra Dopp, MD Referring Physician Dermatology  02/10/16   Kathrynn Ducking, MD Consulting Physician Neurology  05/18/16     CC: fall/head trauma Subjective: Pt presents for an acute OV with complaints of headache of 1 days duration after a fall yesterday at the gym in which she tripped over a machine fell backwards and landed on the back of her head on the gym floor around 6 pm last night. She denies LOC. She iced it through the evening, however at 9 pm she experienced the "most excruciating headache of her life". Last night the pain was behind her eyes, she was afraid to go to sleep. She called EMS, however once she was at ED her wait was > 3 hours, so she left. This morning  She states her head hurts and is swollen in the back, her left occipital and area over ear has numbness/decreased sensation. Standing or walking causes a severe throbbing headache in the back of her head. She feels unstable in her gait. She feels like "fluid is swishing in her head" with movement. She reports her eyes feel better if closed. She is on anitplatlett therapy, Effient. She denies nausea, vomit, visual changes or ringing in the ears. She has seen some difficulty with focusing. Of note, this is her second fall in a month that caused head trauma. She was not seen for a left sided facial trauma a month ago. She reports she tripped over dog and fell on thel eft side of her face, and quite significant bruising at that time (by pictures).  Pt has a significant medical history of  Multiple Sclerosis,  DM, CAD/NSTEMI with stent, HTN, hyperlipidemia and Hypothyroidism. Allergies  Allergen Reactions  . Betaseron [Interferon Beta-1b]     Increased LFT's  . Doxycycline Other (See Comments)    Thrush,dizziness   Social  History  Substance Use Topics  . Smoking status: Former Smoker    Packs/day: 1.00    Quit date: 12/16/2015  . Smokeless tobacco: Never Used  . Alcohol use 0.0 oz/week     Comment: OCCASIONAL ALCOHOL USE.   Past Medical History:  Diagnosis Date  . Basal cell carcinoma   . Cancer ([place])    basal and squamous cell skin cancer  . DM (diabetes mellitus) ([place])   . Goiter, unspecified   . History of colonic polyps 2012  . Multiple sclerosis ([place])   . Non-ST elevated myocardial infarction (non-STEMI) ([place])   . SCCA (squamous cell carcinoma) of skin    right shin  . SEMI (subendocardial myocardial infarction) ([place]) 09/29/09   TOTAL RCA WITH STENT TO OM BRANCH  . Shingles   . Squamous cell carcinoma   . Tobacco abuse   . Unspecified essential hypertension   . Unspecified hypothyroidism    Past Surgical History:  Procedure Laterality Date  . CATARACT SURGERY  12/10  . CESAREAN SECTION    . CORONARY ANGIOPLASTY WITH STENT PLACEMENT    . TONSILLECTOMY    . VESICOVAGINAL FISTULA CLOSURE W/ TAH     Family History  Problem Relation Age of Onset  . Hypertension Mother   . Heart failure Father   . Aneurysm Brother     THORACIC/ABD ANEURYSM  . Prostate cancer Brother 54  . Fibromyalgia Sister   .  Brain cancer Maternal Grandmother 58  . Multiple sclerosis Neg Hx      Medication List       Accurate as of 05/18/16 11:32 AM. Always use your most recent med list.          amitriptyline 50 MG tablet Commonly known as:  ELAVIL Take 1 tablet (50 mg total) by mouth daily.   amLODipine 10 MG tablet Commonly known as:  NORVASC Take 1 tablet (10 mg total) by mouth daily.   aspirin EC 325 MG tablet Take 325 mg by mouth at bedtime.   carvedilol 6.25 MG tablet Commonly known as:  COREG Take 1 tablet (6.25 mg total) by mouth 2 (two) times daily with a meal.   cholecalciferol 1000 units tablet Commonly known as:  VITAMIN D Take 1,000 Units by mouth daily.   CINNAMON PO Take  2,000 mg by mouth daily.   Dimethyl Fumarate 240 MG Cpdr Commonly known as:  TECFIDERA Take 1 capsule (240 mg total) by mouth 2 (two) times daily.   esomeprazole 20 MG capsule Commonly known as:  NEXIUM Take 20 mg by mouth daily at 12 noon.   fenofibrate 160 MG tablet Take 1 tablet (160 mg total) by mouth daily.   fish oil-omega-3 fatty acids 1000 MG capsule Take 1-2 g by mouth daily.   gabapentin 600 MG tablet Commonly known as:  NEURONTIN Take 1 tablet (600 mg total) by mouth 2 (two) times daily.   glimepiride 2 MG tablet Commonly known as:  AMARYL Take 1 tablet (2 mg total) by mouth 2 (two) times daily.   glucose blood test strip DX:E11.9, monitor fasting BG daily.   hydrochlorothiazide 25 MG tablet Commonly known as:  HYDRODIURIL Take 1 tablet (25 mg total) by mouth daily.   Iron 325 (65 Fe) MG Tabs Take 1 tablet by mouth daily.   levothyroxine 100 MCG tablet Commonly known as:  SYNTHROID, LEVOTHROID Take 1 tablet (100 mcg total) by mouth daily.   losartan-hydrochlorothiazide 100-25 MG tablet Commonly known as:  HYZAAR Take 1 tablet by mouth daily.   metFORMIN 1000 MG tablet Commonly known as:  GLUCOPHAGE Take 1 tablet (1,000 mg total) by mouth 2 (two) times daily with a meal.   MULTIVITAMINS PO Take 1 tablet by mouth at bedtime.   mupirocin ointment 2 % Commonly known as:  BACTROBAN   nitroGLYCERIN 0.4 MG SL tablet Commonly known as:  NITROSTAT Place 1 tablet (0.4 mg total) under the tongue every 5 (five) minutes as needed for chest pain.   prasugrel 10 MG Tabs tablet Commonly known as:  EFFIENT Take 1 tablet (10 mg total) by mouth daily.   rosuvastatin 10 MG tablet Commonly known as:  CRESTOR Take 1 tablet (10 mg total) by mouth daily.   venlafaxine XR 75 MG 24 hr capsule Commonly known as:  EFFEXOR-XR Take 75 mg by mouth daily.       No results found for this or any previous visit (from the past 24 hour(s)). No results found.   ROS:  Negative, with the exception of above mentioned in HPI   Objective:  BP (!) 153/84 (BP Location: Right Arm, Patient Position: Sitting, Cuff Size: Normal)   Pulse 76   Temp 98 F (36.7 C)   Resp 20   Wt 191 lb 4 oz (86.8 kg)   SpO2 97%   BMI 33.88 kg/m  Body mass index is 33.88 kg/m. Gen: Afebrile. No acute distress. Nontoxic in appearance, well developed, well nourished.  HENT: swelling and Golf ball sized purple bruise mid-occipital. TTP occipital and left parietal are.a Bilateral TM visualized and WNL. MMM, no oral lesions. Bilateral nares WNL, septum midline.  Eyes:Pupils Equal Round Reactive to light, Extraocular movements intact,  Conjunctiva without redness, discharge or icterus. CV: RRR  Chest: CTAB, no wheeze or crackles.  MSK: no cervical bone tenderness, decreased ROM flexion and extension secondary to occipital discomfort. Bilateral UE muscle strength 5/5.  Neuro: slow guarded gait. PERLA. EOMi (with discomfort). Alert. Oriented x3 . Psych: Normal affect, dress and demeanor. Normal speech. Normal thought content and judgment.  Assessment/Plan: ANNALAURA KUKLA is a 60 y.o. female present for acute OV for  Injury of head, initial encounter Antiplatelet or antithrombotic long-term use Intractable post-traumatic headache, unspecified chronicity pattern Fall, initial encounter - STAT CT head w/o contrast. Pt is on antiplatelet therapy with Effient and ASA 81 mg. - Obvious concern for brain bleed/skull fx, with 59, pt with high impact fall to her head (on cement) on blood thinners with her HPI of headache ("worse of her life"), unstable gait, light sensitivity.  - Awaiting pts daughter to drive her to [facility].  - If acute bleed/process will need to be seen through ED. Pt aware.  - Otherwise will need to follow for probable concussive syndrome through neurology.   - If normal CT will need to follow up before the weekend. Pt declines pain control, she states as long as she  sits still and does not lay flat or stand/walk the pain is bearable.  - verbal report from radiology: Moderate left sided subdural hematoma with midline shift. Patient, daughter and verbal communication to ED physician completed. Pt admitted to [facility] and will be transferred to main campus (locatoin of neurosurgery).   Greater than 40 minutes spent with patient, >50% of time spent face to face counseling patient and coordinating care.  electronically signed by:  Howard Pouch, DO  [place]

## 2016-05-18 NOTE — ED Triage Notes (Signed)
Pt in from outpatient CT for critical result of subdural hematoma following a fall yesterday. Pt is alert, interactive, in NAD. C/o headache.

## 2016-05-18 NOTE — Patient Instructions (Signed)
Please report to [facility].  [address], [address] is a right turn [address] [place].  The turn is near the chop house across the street. It is before you reach the [place] or [place].

## 2016-05-18 NOTE — ED Notes (Signed)
Patient using bedside toilet w/o difficulty

## 2016-05-18 NOTE — ED Provider Notes (Signed)
[facility] Provider Note   CSN: [id number] Arrival date & time: 05/18/16  1243     History   Chief Complaint Chief Complaint  Patient presents with  . Head Injury    HPI Daisy Bennett is a 60 y.o. female.  Daisy Bennett is a 60 y.o. Female who presents to the ED after a trip and fall yesterday at the gym.  She is on Effient and aspirin. She was seen by PCP today who did an outpatient CT head and found a subdural hematoma. Patient reports she was at the gym yesterday and tripped on some gym equipment and hit the back of her head. She denies LOC. She reports feeling okay until around 9 PM last night. She reports after standing up last night she developed suddenly a very bad headache. She describes the headache as frontal and she reports it seems to be worse with position change. She reports when she sits down her headache gets better. She went to the emergency department last night, but waited in the waiting room and left before being seen. Today her primary care doctor did an outpatient CT scan which showed a subdural hematoma and she was directed to the ER. She reports feeling okay right now unless she stands up. She reports feeling foggy and like her head is full of water. She denies any numbness, tingling or weakness. She denies current headache. She reports some mild pain to her posterior head. No nausea or vomiting. No double vision. She denies fevers, chest pain, shortness of breath, double vision, numbness, tingling, weakness, abdominal pain, nausea, vomiting, syncope neck pain, back pain, other injury, or rashes.   The history is provided by the patient and medical records. No language interpreter was used.  Head Injury   Pertinent negatives include no numbness, no vomiting and no weakness.    Past Medical History:  Diagnosis Date  . Basal cell carcinoma   . Cancer ([place])    basal and squamous cell skin cancer  . DM (diabetes mellitus) ([place])   . Goiter,  unspecified   . History of colonic polyps 2012  . Multiple sclerosis ([place])   . Non-ST elevated myocardial infarction (non-STEMI) ([place])   . SCCA (squamous cell carcinoma) of skin    right shin  . SEMI (subendocardial myocardial infarction) ([place]) 09/29/09   TOTAL RCA WITH STENT TO OM BRANCH  . Shingles   . Squamous cell carcinoma   . Tobacco abuse   . Unspecified essential hypertension   . Unspecified hypothyroidism     Patient Active Problem List   Diagnosis Date Noted  . Head injury 05/18/2016  . Antiplatelet or antithrombotic long-term use 05/18/2016  . Intractable post-traumatic headache 05/18/2016  . BMI 30.0-30.9,adult 05/12/2016  . History of colon polyps 12/15/2015  . Thrush 10/29/2015  . Acute bronchitis 10/27/2015  . Tobacco abuse counseling 03/20/2015  . Near syncope 03/11/2015  . Mixed hyperlipidemia 10/23/2009  . Coronary atherosclerosis 10/23/2009  . CARDIAC MURMUR 10/23/2009  . Hypothyroidism 10/15/2009  . Diabetes type 2, controlled ([place]) 10/15/2009  . Multiple sclerosis ([place]) 10/15/2009  . Essential hypertension 10/15/2009    Past Surgical History:  Procedure Laterality Date  . CATARACT SURGERY  12/10  . CESAREAN SECTION    . CORONARY ANGIOPLASTY WITH STENT PLACEMENT    . TONSILLECTOMY    . VESICOVAGINAL FISTULA CLOSURE W/ TAH      OB History    No data available       Home  Medications    Prior to Admission medications   Medication Sig Start Date End Date Taking? Authorizing Provider  amitriptyline (ELAVIL) 50 MG tablet Take 1 tablet (50 mg total) by mouth daily. Patient taking differently: Take 50 mg by mouth at bedtime.  12/24/15   Kathrynn Ducking, MD  amLODipine (NORVASC) 10 MG tablet Take 1 tablet (10 mg total) by mouth daily. 05/19/15   Josue Hector, MD  aspirin EC 325 MG EC tablet Take 325 mg by mouth at bedtime.     Historical Provider, MD  carvedilol (COREG) 6.25 MG tablet Take 1 tablet (6.25 mg total) by mouth 2 (two) times daily with  a meal. 06/17/15   Josue Hector, MD  cholecalciferol (VITAMIN D) 1000 units tablet Take 1,000 Units by mouth daily.    Historical Provider, MD  CINNAMON PO Take 2,000 mg by mouth daily.     Historical Provider, MD  Dimethyl Fumarate (TECFIDERA) 240 MG CPDR Take 1 capsule (240 mg total) by mouth 2 (two) times daily. 09/18/15   Kathrynn Ducking, MD  esomeprazole (NEXIUM) 20 MG capsule Take 20 mg by mouth daily at 12 noon.    Historical Provider, MD  fenofibrate 160 MG tablet Take 1 tablet (160 mg total) by mouth daily. 03/02/16   Renee A Kuneff, DO  Ferrous Sulfate (IRON) 325 (65 FE) MG TABS Take 1 tablet by mouth daily.      Historical Provider, MD  fish oil-omega-3 fatty acids 1000 MG capsule Take 1-2 g by mouth daily.     Historical Provider, MD  gabapentin (NEURONTIN) 600 MG tablet Take 1 tablet (600 mg total) by mouth 2 (two) times daily. 04/07/16   Kathrynn Ducking, MD  glimepiride (AMARYL) 2 MG tablet Take 1 tablet (2 mg total) by mouth 2 (two) times daily. 03/26/16   Renee A Kuneff, DO  glucose blood test strip DX:E11.9, monitor fasting BG daily. 04/16/15   Renee A Kuneff, DO  hydrochlorothiazide (HYDRODIURIL) 25 MG tablet Take 1 tablet (25 mg total) by mouth daily. 05/12/16   Renee A Kuneff, DO  levothyroxine (SYNTHROID, LEVOTHROID) 100 MCG tablet Take 1 tablet (100 mcg total) by mouth daily. 10/14/15   Renee A Kuneff, DO  losartan-hydrochlorothiazide (HYZAAR) 100-25 MG tablet Take 1 tablet by mouth daily. 03/18/16   Josue Hector, MD  metFORMIN (GLUCOPHAGE) 1000 MG tablet Take 1 tablet (1,000 mg total) by mouth 2 (two) times daily with a meal. 02/10/16   Renee A Kuneff, DO  Multiple Vitamin (MULTIVITAMINS PO) Take 1 tablet by mouth at bedtime.     Historical Provider, MD  mupirocin ointment (BACTROBAN) 2 %  05/10/16   Historical Provider, MD  nitroGLYCERIN (NITROSTAT) 0.4 MG SL tablet Place 1 tablet (0.4 mg total) under the tongue every 5 (five) minutes as needed for chest pain. 09/18/14 05/17/16   Josue Hector, MD  prasugrel (EFFIENT) 10 MG TABS tablet Take 1 tablet (10 mg total) by mouth daily. 03/18/16   Josue Hector, MD  rosuvastatin (CRESTOR) 10 MG tablet Take 1 tablet (10 mg total) by mouth daily. 03/18/16   Josue Hector, MD  venlafaxine (EFFEXOR-XR) 75 MG 24 hr capsule Take 75 mg by mouth daily.      Historical Provider, MD    Family History Family History  Problem Relation Age of Onset  . Hypertension Mother   . Heart failure Father   . Aneurysm Brother     THORACIC/ABD ANEURYSM  . Prostate cancer  Brother 46  . Fibromyalgia Sister   . Brain cancer Maternal Grandmother 46  . Multiple sclerosis Neg Hx     Social History Social History  Substance Use Topics  . Smoking status: Former Smoker    Packs/day: 1.00    Quit date: 12/16/2015  . Smokeless tobacco: Never Used  . Alcohol use 0.0 oz/week     Comment: OCCASIONAL ALCOHOL USE.     Allergies   Betaseron [interferon beta-1b] and Doxycycline   Review of Systems Review of Systems  Constitutional: Negative for chills and fever.  HENT: Negative for congestion and sore throat.   Eyes: Negative for visual disturbance.  Respiratory: Negative for cough, shortness of breath and wheezing.   Cardiovascular: Negative for chest pain and palpitations.  Gastrointestinal: Negative for abdominal pain, diarrhea, nausea and vomiting.  Genitourinary: Negative for dysuria.  Musculoskeletal: Negative for back pain and neck pain.  Skin: Negative for rash.  Neurological: Positive for headaches. Negative for dizziness, seizures, syncope, facial asymmetry, speech difficulty, weakness, light-headedness and numbness.     Physical Exam Updated Vital Signs BP 137/69   Pulse 76   Temp 97.9 F (36.6 C)   Resp 17   Ht 5\' 3"  (1.6 m)   Wt 86.6 kg   SpO2 94%   BMI 33.83 kg/m   Physical Exam  Constitutional: She is oriented to person, place, and time. She appears well-developed and well-nourished. No distress.  Nontoxic  appearing.  HENT:  Head: Normocephalic.  Right Ear: External ear normal.  Left Ear: External ear normal.  Mouth/Throat: Oropharynx is clear and moist.  Small hematoma noted to her posterior head.No other visible signs of head injury. Bilateral tympanic membranes are pearly-gray without erythema or loss of landmarks.   Eyes: Conjunctivae and EOM are normal. Pupils are equal, round, and reactive to light. Right eye exhibits no discharge. Left eye exhibits no discharge.  Neck: Normal range of motion. Neck supple. No JVD present. No tracheal deviation present.  No midline neck tenderness.  Cardiovascular: Normal rate, regular rhythm, normal heart sounds and intact distal pulses.  Exam reveals no gallop and no friction rub.   No murmur heard. Pulmonary/Chest: Effort normal and breath sounds normal. No stridor. No respiratory distress. She has no wheezes. She has no rales.  Abdominal: Soft. There is no tenderness. There is no guarding.  Musculoskeletal: Normal range of motion. She exhibits no edema or tenderness.  Lymphadenopathy:    She has no cervical adenopathy.  Neurological: She is alert and oriented to person, place, and time. No cranial nerve deficit. Coordination normal.  Patient is alert and oriented 3. Cranial nerves are intact. Speech is clear and coherent. EOMs are intact. Vision is grossly intact. No pronator drift. Finger-to-nose intact bilaterally. Sensation is intact her bilateral upper and lower extremities. Good strength to her bilateral upper and lower extremities.  Skin: Skin is warm and dry. Capillary refill takes less than 2 seconds. No rash noted. She is not diaphoretic. No erythema. No pallor.  Psychiatric: She has a normal mood and affect. Her behavior is normal.  Nursing note and vitals reviewed.    ED Treatments / Results  Labs (all labs ordered are listed, but only abnormal results are displayed) Labs Reviewed  BASIC METABOLIC PANEL - Abnormal; Notable for the  following:       Result Value   Sodium 133 (*)    Potassium 3.1 (*)    Chloride 96 (*)    Glucose, Bld 113 (*)  All other components within normal limits  CBC WITH DIFFERENTIAL/PLATELET - Abnormal; Notable for the following:    WBC 13.1 (*)    Neutro Abs 11.2 (*)    Lymphs Abs 0.6 (*)    All other components within normal limits  PROTIME-INR    EKG  EKG Interpretation None       Radiology Ct Head Wo Contrast  Addendum Date: 05/18/2016   ADDENDUM REPORT: 05/18/2016 12:23 ADDENDUM: Critical Value/emergent results were called by telephone at the time of interpretation on 05/18/2016 at 12:23 pm to Dr. Howard Pouch , who verbally acknowledged these results. Electronically Signed   By: Franchot Gallo M.D.   On: 05/18/2016 12:23   Result Date: 05/18/2016 CLINICAL DATA:  [place] yesterday. Head injury. On blood thinner. Headache. EXAM: CT HEAD WITHOUT CONTRAST TECHNIQUE: Contiguous axial images were obtained from the base of the skull through the vertex without intravenous contrast. COMPARISON:  MRI head 04/11/2016 FINDINGS: Brain: Acute left subdural hematomas are present. 7 mm thick interhemispheric subdural hematoma anteriorly on the left. Left hemispheric subdural hematoma involving the left temporal lobe, and left frontal and parietal lobe. Much of this is over the convexity as well as some in the temporal lobe. This measures up to 7 mm in thickness in the left frontal convexity and approximately 3 mm in the temporal lobe. 3 mm midline shift towards the right due to mass-effect. Negative for hydrocephalus. No acute infarct or mass. Vascular: No hyperdense vessel or unexpected calcification. Skull: Negative for fracture. Sinuses/Orbits: Mild mucosal thickening left lateral sphenoid sinus. Remaining sinuses clear. Other: None IMPRESSION: Acute subdural hematoma on the left. This involves the interhemispheric fissure as well as the left hemisphere most notably over the convexity. This measures  up to 7 mm in thickness. 3 mm midline shift to the right. Electronically Signed: By: Franchot Gallo M.D. On: 05/18/2016 12:19    Procedures Procedures (including critical care time)  Medications Ordered in ED Medications - No data to display   Initial Impression / Assessment and Plan / ED Course  I have reviewed the triage vital signs and the nursing notes.  Pertinent labs & imaging results that were available during my care of the patient were reviewed by me and considered in my medical decision making (see chart for details).  Clinical Course  Comment By Time  I consulted with neurosurgeon Dr. Cyndy Freeze who has evaluated the CT scan. He does not think this is surgical and is reassured that this occurred yesterday. He would like her admitted to hospitalist service for overnight observation.  Waynetta Pean, PA-C 10/17 1339   This is a 60 y.o. Female who presents to the ED after a trip and fall yesterday at the gym.  She is on Effient and aspirin. She was seen by PCP today who did an outpatient CT head and found a subdural hematoma. Patient reports she was at the gym yesterday and tripped on some gym equipment and hit the back of her head. She denies LOC. She reports feeling okay until around 9 PM last night. She reports after standing up last night she developed suddenly a very bad headache. She describes the headache as frontal and she reports it seems to be worse with position change. She reports when she sits down her headache gets better. She went to the emergency department last night, but waited in the waiting room and left before being seen. Today her primary care doctor did an outpatient CT scan which showed a subdural  hematoma and she was directed to the ER. She reports feeling okay right now unless she stands up. She reports feeling foggy and like her head is full of water. She denies any numbness, tingling or weakness. She denies current headache. She reports some mild pain to her  posterior head. No nausea or vomiting. No double vision. On exam the patient is afebrile nontoxic appearing. She has no focal neurological deficits on exam. He is alert and oriented 3. CT scan indicated an acute subdural hematoma on the left. It measures up to 7 mm in thickness. 3 mm of midline shift to the right. INR here is 0.95. Normal hemoglobin and hematocrit.  I consulted with neurosurgeon Dr. Cyndy Freeze who evaluated the CT scan. He does not feel this requires surgical intervention. He would like the patient admitted to the hospitalist service for overnight observation.  I spoke with hospitalist Dr. Barbaraann Faster who accepted the patient for admission and requested temporary orders for telemetry bed.  Patient and family are in agreement with plan. At recheck patient reports she is feeling much better now that she is resting in bed.   This patient was discussed with Dr. Billy Fischer who agrees with assessment and plan.   Final Clinical Impressions(s) / ED Diagnoses   Final diagnoses:  Subdural hematoma ([place])  Fall, initial encounter    New Prescriptions New Prescriptions   No medications on file     Waynetta Pean, PA-C 05/18/16 [place], MD 05/20/16 [phone]

## 2016-05-18 NOTE — ED Notes (Signed)
Called floor to give report and they will call back

## 2016-05-18 NOTE — Progress Notes (Signed)
SCDs ordered.  Ave Filter, RN

## 2016-05-18 NOTE — Progress Notes (Signed)
   Patient coming from [place]. Patient sustained a mechanical fall 1 day ago striking her head on a concrete floor. Patient on Effient and aspirin. Subsequently developed "worst headache of my life. " Seen by PCP who sent patient to [facility]. CT scan at [facility] showing "Acute subdural hematoma on the left. This involves the interhemispheric fissure as well as the left hemisphere most notably over the convexity. This measures up to 7 mm in thickness. 3 mm midline shift to the right." Dr. Cyndy Freeze of neurosurgery consult and requesting patient be admitted for overnight observation. Patient currently hemodynamically stable and with no focal neurological deficits. Headache currently only with standing and ambulation.  Linna Darner, MD [facility] 05/18/2016, 2:44 PM

## 2016-05-19 ENCOUNTER — Observation Stay (HOSPITAL_COMMUNITY): Payer: 59

## 2016-05-19 DIAGNOSIS — Z85828 Personal history of other malignant neoplasm of skin: Secondary | ICD-10-CM | POA: Diagnosis not present

## 2016-05-19 DIAGNOSIS — Z87891 Personal history of nicotine dependence: Secondary | ICD-10-CM | POA: Diagnosis not present

## 2016-05-19 DIAGNOSIS — I2510 Atherosclerotic heart disease of native coronary artery without angina pectoris: Secondary | ICD-10-CM

## 2016-05-19 DIAGNOSIS — Z808 Family history of malignant neoplasm of other organs or systems: Secondary | ICD-10-CM | POA: Diagnosis not present

## 2016-05-19 DIAGNOSIS — I252 Old myocardial infarction: Secondary | ICD-10-CM | POA: Diagnosis not present

## 2016-05-19 DIAGNOSIS — Y9239 Other specified sports and athletic area as the place of occurrence of the external cause: Secondary | ICD-10-CM | POA: Diagnosis not present

## 2016-05-19 DIAGNOSIS — E876 Hypokalemia: Secondary | ICD-10-CM | POA: Diagnosis present

## 2016-05-19 DIAGNOSIS — Z955 Presence of coronary angioplasty implant and graft: Secondary | ICD-10-CM | POA: Diagnosis not present

## 2016-05-19 DIAGNOSIS — E1121 Type 2 diabetes mellitus with diabetic nephropathy: Secondary | ICD-10-CM | POA: Diagnosis not present

## 2016-05-19 DIAGNOSIS — Z7982 Long term (current) use of aspirin: Secondary | ICD-10-CM | POA: Diagnosis not present

## 2016-05-19 DIAGNOSIS — E782 Mixed hyperlipidemia: Secondary | ICD-10-CM | POA: Diagnosis present

## 2016-05-19 DIAGNOSIS — W19XXXA Unspecified fall, initial encounter: Secondary | ICD-10-CM

## 2016-05-19 DIAGNOSIS — S065X0A Traumatic subdural hemorrhage without loss of consciousness, initial encounter: Secondary | ICD-10-CM | POA: Diagnosis present

## 2016-05-19 DIAGNOSIS — I6200 Nontraumatic subdural hemorrhage, unspecified: Secondary | ICD-10-CM | POA: Diagnosis not present

## 2016-05-19 DIAGNOSIS — E039 Hypothyroidism, unspecified: Secondary | ICD-10-CM | POA: Diagnosis present

## 2016-05-19 DIAGNOSIS — Z79899 Other long term (current) drug therapy: Secondary | ICD-10-CM | POA: Diagnosis not present

## 2016-05-19 DIAGNOSIS — E049 Nontoxic goiter, unspecified: Secondary | ICD-10-CM | POA: Diagnosis present

## 2016-05-19 DIAGNOSIS — F329 Major depressive disorder, single episode, unspecified: Secondary | ICD-10-CM | POA: Diagnosis present

## 2016-05-19 DIAGNOSIS — G35 Multiple sclerosis: Secondary | ICD-10-CM | POA: Diagnosis present

## 2016-05-19 DIAGNOSIS — E119 Type 2 diabetes mellitus without complications: Secondary | ICD-10-CM | POA: Diagnosis present

## 2016-05-19 DIAGNOSIS — I10 Essential (primary) hypertension: Secondary | ICD-10-CM | POA: Diagnosis present

## 2016-05-19 DIAGNOSIS — Z8249 Family history of ischemic heart disease and other diseases of the circulatory system: Secondary | ICD-10-CM | POA: Diagnosis not present

## 2016-05-19 DIAGNOSIS — Z7902 Long term (current) use of antithrombotics/antiplatelets: Secondary | ICD-10-CM | POA: Diagnosis not present

## 2016-05-19 DIAGNOSIS — W010XXA Fall on same level from slipping, tripping and stumbling without subsequent striking against object, initial encounter: Secondary | ICD-10-CM | POA: Diagnosis present

## 2016-05-19 DIAGNOSIS — Z7984 Long term (current) use of oral hypoglycemic drugs: Secondary | ICD-10-CM | POA: Diagnosis not present

## 2016-05-19 LAB — BASIC METABOLIC PANEL
Anion gap: 10 (ref 5–15)
BUN: 5 mg/dL — ABNORMAL LOW (ref 6–20)
CO2: 27 mmol/L (ref 22–32)
Calcium: 9.3 mg/dL (ref 8.9–10.3)
Chloride: 99 mmol/L — ABNORMAL LOW (ref 101–111)
Creatinine, Ser: 0.74 mg/dL (ref 0.44–1.00)
GFR calc Af Amer: >60 mL/min (ref >60)
GFR calc non Af Amer: >60 mL/min (ref >60)
Glucose, Bld: 145 mg/dL — ABNORMAL HIGH (ref 65–99)
Potassium: 3.2 mmol/L — ABNORMAL LOW (ref 3.5–5.1)
Sodium: 136 mmol/L (ref 135–145)

## 2016-05-19 LAB — CBC
HCT: 36 % (ref 36.0–46.0)
Hemoglobin: 12.3 g/dL (ref 12.0–15.0)
MCH: 31.9 pg (ref 26.0–34.0)
MCHC: 34.2 g/dL (ref 30.0–36.0)
MCV: 93.3 fL (ref 78.0–100.0)
Platelets: 195 10*3/uL (ref 150–400)
RBC: 3.86 MIL/uL — ABNORMAL LOW (ref 3.87–5.11)
RDW: 13.4 % (ref 11.5–15.5)
WBC: 9.1 10*3/uL (ref 4.0–10.5)

## 2016-05-19 LAB — GLUCOSE, CAPILLARY
Glucose-Capillary: 151 mg/dL — ABNORMAL HIGH (ref 65–99)
Glucose-Capillary: 132 mg/dL — ABNORMAL HIGH (ref 65–99)
Glucose-Capillary: 137 mg/dL — ABNORMAL HIGH (ref 65–99)
Glucose-Capillary: 180 mg/dL — ABNORMAL HIGH (ref 65–99)
Glucose-Capillary: 213 mg/dL — ABNORMAL HIGH (ref 65–99)

## 2016-05-19 LAB — MAGNESIUM: Magnesium: 1.5 mg/dL — ABNORMAL LOW (ref 1.7–2.4)

## 2016-05-19 MED ORDER — POTASSIUM CHLORIDE CRYS ER 20 MEQ PO TBCR
30.0000 meq | EXTENDED_RELEASE_TABLET | Freq: Two times a day (BID) | ORAL | Status: DC
  Administered 2016-05-19 – 2016-05-20 (×2): 30 meq via ORAL
  Filled 2016-05-19 (×2): qty 1

## 2016-05-19 MED ORDER — MAGNESIUM CHLORIDE 64 MG PO TBEC
1.0000 | DELAYED_RELEASE_TABLET | Freq: Every day | ORAL | Status: DC
  Administered 2016-05-19 – 2016-05-20 (×2): 64 mg via ORAL
  Filled 2016-05-19 (×2): qty 1

## 2016-05-19 MED ORDER — HYDRALAZINE HCL 20 MG/ML IJ SOLN
5.0000 mg | INTRAMUSCULAR | Status: DC | PRN
Start: 2016-05-19 — End: 2016-05-20

## 2016-05-19 MED ORDER — INSULIN ASPART 100 UNIT/ML ~~LOC~~ SOLN
0.0000 [IU] | Freq: Three times a day (TID) | SUBCUTANEOUS | Status: DC
  Administered 2016-05-19 (×2): 1 [IU] via SUBCUTANEOUS
  Administered 2016-05-19: 2 [IU] via SUBCUTANEOUS
  Administered 2016-05-20: 1 [IU] via SUBCUTANEOUS
  Administered 2016-05-20: 2 [IU] via SUBCUTANEOUS

## 2016-05-19 MED ORDER — MORPHINE SULFATE (PF) 2 MG/ML IV SOLN
1.0000 mg | INTRAVENOUS | Status: DC | PRN
  Administered 2016-05-20: 1 mg via INTRAVENOUS
  Filled 2016-05-19: qty 1

## 2016-05-19 MED ORDER — INSULIN ASPART 100 UNIT/ML ~~LOC~~ SOLN: 0.0000 [IU] | Freq: Every day | SUBCUTANEOUS | Status: DC

## 2016-05-19 NOTE — Progress Notes (Signed)
Patient home med, dimethyl fumarate, counted with patient and taken to pharmacy to be dispensed.  Home med sheet placed in shadow chart.

## 2016-05-19 NOTE — Progress Notes (Signed)
PROGRESS NOTE    Daisy Bennett  [id number] DOB: 06/09/1956 DOA: 05/18/2016 PCP: Howard Pouch, DO    Brief Narrative:  RAZIYAH Bennett is a 60 y.o. female with medical history significant of hypertension, hyperlipidemia, diabetes mellitus, GERD, hypothyroidism, depression, CAD, non-STEMI, multiple sclerosis, skin cancer, who presents with fall and head injury.  Patient states that she tripped her steps and fell while she was doing exercise in gym at all about 6 PM. No prodromal symptoms, such as unilateral weakness, chest pain, dizziness, vision change, hearing loss. Patient states that she developed a severe headache over frontal and occipital area, which is the worst pain in her life. Her headache has gradually improved, currently 2 out of 10 in severity, nonradiating. It is aggravated by moving. Patient denies fever, chills, shortness of breath, cough, nausea, vomiting, diarrhea, abdominal pain, symptoms of UTI.  Patient was found to have WBC 13.1, INR 0.95, potassium 3.1, creatinine normal, temperature normal, no tachycardia, CT head showed Acute subdural hematoma on the left. This involves the interhemispheric fissure as well as the left hemisphere most notably over the convexity. This measures up to 7 mm in thickness. 3 mm midline shift to the right. Pt is placed on tele bed for obs. Neurosurgeon, Dr. Cyndy Freeze was consulted.   Assessment & Plan:   Principal Problem:   SDH (subdural hematoma) (HCC) Active Problems:   Hypothyroidism   Diabetes type 2, controlled ([place])   Mixed hyperlipidemia   Multiple sclerosis (HCC)   Essential hypertension   Coronary atherosclerosis   Head injury   Antiplatelet or antithrombotic long-term use   Fall   Hypokalemia   SDH (subdural hematoma) ([place]): as evidenced by Ct-head. There is mild 3 mm midline shift to the right. No focal neurological findings on physical examination. Her headache has improved. Neurosurgeon, Dr. Cyndy Freeze was  consulted. -will place on tele bed -Frequent neuro check -Hold aspirin and Effient -repeat CT-head at 11:00 am showed no increase in SDH -When necessary Tylenol and Percocet for pain -f/u neurosurgeon's recommendations: appreciate their input - patient on tylenol and percocet for headache - can add a PRN morphine for headache as would like to avoid NSAIDs and aspirin at this time  Hypothyroidism: Last TSH was 1.78 on 10/14/15 -Continue home Synthroid  DM-II: Last A1c 6.5 on 05/13/16, well controled. Patient is taking metformin and Amaryl at home -SSI  HLD: Last LDL was noted on record, TG 726 on 09/30/09 -Continue home medications: Fenofibrate and Crestor  Multiple sclerosis ([place]): stable -continue home T[employer]  HTN: Blood pressure 161/83 -continue amlodipine, Coreg, HCTZ, Hyzaar -IV hydralazine.  Coronary atherosclerosis: s/p stent. No CP -hold ASA and Effient -Continue Coreg and Crestor  Hypokalemia: K= 3.1 on admission. - Repeat this am at 3.2 - KCl 4mEq PO BID - Mg low- will order MagTab   DVT ppx: SCD Code Status: Full code Family Communication: Yes, patient's husband and sister in law at bed side Disposition Plan:  Anticipate discharge back to previous home environment likely tomorrow   Consultants:   Neurosurgery  Procedures:   None  Antimicrobials:   None    Subjective: Patient seen and evaluated.  She voices her headache is still present and is worsened in the light.  She denies confusion, nausea, vomiting, loss of consciousness.  Does mention she had a significant fall about 5 weeks ago in which most of the left side of her face was an ecchymosis.  She did not seek medical evaluation at that time.  Mentions to me that she is worried as she is so clumsy that she will have another fall and possibly another bleed.  No vision changes.  Able to ambulate without concern to and from the bathroom.  Objective: Vitals:   05/18/16 1930 05/19/16 0131  05/19/16 0535 05/19/16 1017  BP: (!) 152/65 130/70 (!) 141/65 (!) 150/66  Pulse: 78 70 83 80  Resp: 16 16 16 16   Temp: 98.5 F (36.9 C) 98.7 F (37.1 C) 98.6 F (37 C) 98.3 F (36.8 C)  TempSrc: Oral Oral Oral Oral  SpO2: 96% 93% 97% 96%  Weight: 85.3 kg (188 lb 0.8 oz)     Height: 5\' 6"  (1.676 m)       Intake/Output Summary (Last 24 hours) at 05/19/16 1316 Last data filed at 05/18/16 2300  Gross per 24 hour  Intake            41.25 ml  Output                0 ml  Net            41.25 ml   Filed Weights   05/18/16 1250 05/18/16 1930  Weight: 86.6 kg (191 lb) 85.3 kg (188 lb 0.8 oz)    Examination:  General exam: Appears calm and comfortable  Respiratory system: Clear to auscultation. Respiratory effort normal. Cardiovascular system: S1 & S2 heard, RRR. No JVD, murmurs, rubs, gallops or clicks. No pedal edema. Gastrointestinal system: Abdomen is nondistended, soft and nontender. No organomegaly or masses felt. Normal bowel sounds heard. Central nervous system: Alert and oriented. No focal neurological deficits. Extremities: Symmetric 5 x 5 power. Skin: No rashes, lesions or ulcers, faint well healed ecchymosis on the left zygomatic area of face Psychiatry: Judgement and insight appear normal. Mood & affect appropriate.     Data Reviewed: I have personally reviewed following labs and imaging studies  CBC:  Recent Labs Lab 05/18/16 1305 05/19/16 0413  WBC 13.1* 9.1  NEUTROABS 11.2*  --   HGB 12.8 12.3  HCT 36.2 36.0  MCV 93.1 93.3  PLT 201 0000000   Basic Metabolic Panel:  Recent Labs Lab 05/18/16 1305 05/19/16 0413  NA 133* 136  K 3.1* 3.2*  CL 96* 99*  CO2 27 27  GLUCOSE 113* 145*  BUN 8 5*  CREATININE 0.68 0.74  CALCIUM 9.7 9.3  MG  --  1.5*   GFR: Estimated Creatinine Clearance: 81.3 mL/min (by C-G formula based on SCr of 0.74 mg/dL). Liver Function Tests: No results for input(s): AST, ALT, ALKPHOS, BILITOT, PROT, ALBUMIN in the last 168  hours. No results for input(s): LIPASE, AMYLASE in the last 168 hours. No results for input(s): AMMONIA in the last 168 hours. Coagulation Profile:  Recent Labs Lab 05/18/16 1305  INR 0.95   Cardiac Enzymes: No results for input(s): CKTOTAL, CKMB, CKMBINDEX, TROPONINI in the last 168 hours. BNP (last 3 results) No results for input(s): PROBNP in the last 8760 hours. HbA1C: No results for input(s): HGBA1C in the last 72 hours. CBG:  Recent Labs Lab 05/19/16 0615 05/19/16 1217  GLUCAP 151* 132*   Lipid Profile: No results for input(s): CHOL, HDL, LDLCALC, TRIG, CHOLHDL, LDLDIRECT in the last 72 hours. Thyroid Function Tests: No results for input(s): TSH, T4TOTAL, FREET4, T3FREE, THYROIDAB in the last 72 hours. Anemia Panel: No results for input(s): VITAMINB12, FOLATE, FERRITIN, TIBC, IRON, RETICCTPCT in the last 72 hours. Sepsis Labs: No results for input(s): PROCALCITON, LATICACIDVEN in the  last 168 hours.  No results found for this or any previous visit (from the past 240 hour(s)).       Radiology Studies: Ct Head Wo Contrast  Result Date: 05/19/2016 CLINICAL DATA:  Follow-up subdural hematoma. Fall 2 days ago. Headache. EXAM: CT HEAD WITHOUT CONTRAST TECHNIQUE: Contiguous axial images were obtained from the base of the skull through the vertex without intravenous contrast. COMPARISON:  05/18/2016 FINDINGS: Brain: Small acute subdural hematoma over the left cerebral convexity in the frontal, parietal, and temporal regions is unchanged. Subdural hematoma extending along the left aspect of the falx is also unchanged and measures up to 8 mm in thickness. Rightward midline shift measures 4 mm, not significantly changed. There is no evidence of acute cortical infarct, mass, or hydrocephalus. Vascular: Atherosclerotic calcification involving the carotid siphons. Skull: No skull fracture or focal osseous lesion identified. Sinuses/Orbits: Scattered, mild paranasal sinus mucosal  thickening. Clear mastoid air cells. Prior bilateral cataract extraction. Other: Unchanged posterior scalp soft tissue swelling. IMPRESSION: Unchanged left-sided subdural hemorrhage and mild rightward midline shift. Electronically Signed   By: Logan Bores M.D.   On: 05/19/2016 12:37   Ct Head Wo Contrast  Addendum Date: 05/18/2016   ADDENDUM REPORT: 05/18/2016 12:23 ADDENDUM: Critical Value/emergent results were called by telephone at the time of interpretation on 05/18/2016 at 12:23 pm to Dr. Howard Pouch , who verbally acknowledged these results. Electronically Signed   By: Franchot Gallo M.D.   On: 05/18/2016 12:23   Result Date: 05/18/2016 CLINICAL DATA:  [place] yesterday. Head injury. On blood thinner. Headache. EXAM: CT HEAD WITHOUT CONTRAST TECHNIQUE: Contiguous axial images were obtained from the base of the skull through the vertex without intravenous contrast. COMPARISON:  MRI head 04/11/2016 FINDINGS: Brain: Acute left subdural hematomas are present. 7 mm thick interhemispheric subdural hematoma anteriorly on the left. Left hemispheric subdural hematoma involving the left temporal lobe, and left frontal and parietal lobe. Much of this is over the convexity as well as some in the temporal lobe. This measures up to 7 mm in thickness in the left frontal convexity and approximately 3 mm in the temporal lobe. 3 mm midline shift towards the right due to mass-effect. Negative for hydrocephalus. No acute infarct or mass. Vascular: No hyperdense vessel or unexpected calcification. Skull: Negative for fracture. Sinuses/Orbits: Mild mucosal thickening left lateral sphenoid sinus. Remaining sinuses clear. Other: None IMPRESSION: Acute subdural hematoma on the left. This involves the interhemispheric fissure as well as the left hemisphere most notably over the convexity. This measures up to 7 mm in thickness. 3 mm midline shift to the right. Electronically Signed: By: Franchot Gallo M.D. On: 05/18/2016 12:19         Scheduled Meds: . amitriptyline  50 mg Oral QHS  . amLODipine  10 mg Oral Daily  . carvedilol  6.25 mg Oral BID WC  . cholecalciferol  1,000 Units Oral Daily  . Dimethyl Fumarate  240 mg Oral BID  . fenofibrate  160 mg Oral Daily  . ferrous sulfate  325 mg Oral Q breakfast  . gabapentin  600 mg Oral BID  . hydrochlorothiazide  25 mg Oral Daily  . insulin aspart  0-5 Units Subcutaneous QHS  . insulin aspart  0-9 Units Subcutaneous TID WC  . levothyroxine  100 mcg Oral QAC breakfast  . losartan  100 mg Oral Daily  . multivitamin with minerals  1 tablet Oral Daily  . mupirocin ointment   Topical Daily  . omega-3 acid ethyl  esters  1 g Oral BID  . pantoprazole  40 mg Oral Daily  . rosuvastatin  10 mg Oral q1800  . sodium chloride flush  3 mL Intravenous Q12H  . venlafaxine XR  75 mg Oral Daily   Continuous Infusions: . sodium chloride 75 mL/hr at 05/18/16 2227     LOS: 0 days    Time spent: 40 minutes    Newman Pies, MD [facility] Pager [phone]  If 7PM-7AM, please contact night-coverage [email or web address] Password [email or web address] 05/19/2016, 1:16 PM

## 2016-05-20 ENCOUNTER — Encounter: Payer: Self-pay | Admitting: Neurology

## 2016-05-20 ENCOUNTER — Telehealth: Payer: Self-pay | Admitting: Neurology

## 2016-05-20 DIAGNOSIS — E1121 Type 2 diabetes mellitus with diabetic nephropathy: Secondary | ICD-10-CM

## 2016-05-20 LAB — BASIC METABOLIC PANEL
Anion gap: 8 (ref 5–15)
BUN: 7 mg/dL (ref 6–20)
CO2: 28 mmol/L (ref 22–32)
Calcium: 9.3 mg/dL (ref 8.9–10.3)
Chloride: 101 mmol/L (ref 101–111)
Creatinine, Ser: 0.69 mg/dL (ref 0.44–1.00)
GFR calc Af Amer: >60 mL/min (ref >60)
GFR calc non Af Amer: >60 mL/min (ref >60)
Glucose, Bld: 150 mg/dL — ABNORMAL HIGH (ref 65–99)
Potassium: 3.8 mmol/L (ref 3.5–5.1)
Sodium: 137 mmol/L (ref 135–145)

## 2016-05-20 LAB — GLUCOSE, CAPILLARY
Glucose-Capillary: 137 mg/dL — ABNORMAL HIGH (ref 65–99)
Glucose-Capillary: 192 mg/dL — ABNORMAL HIGH (ref 65–99)
Glucose-Capillary: 228 mg/dL — ABNORMAL HIGH (ref 65–99)

## 2016-05-20 MED ORDER — ACETAMINOPHEN 325 MG PO TABS: 650.0000 mg | ORAL_TABLET | Freq: Four times a day (QID) | ORAL | Status: DC | PRN

## 2016-05-20 MED ORDER — OXYCODONE HCL 10 MG PO TABS: 10.0000 mg | ORAL_TABLET | Freq: Four times a day (QID) | ORAL | 0 refills | Status: DC | PRN

## 2016-05-20 NOTE — Telephone Encounter (Signed)
Pt's husband called in stating the pt is at [facility] due to a [facility] and hitting her head. He states she has a brain bleed but is having headaches. The husband would like to speak with Dr. Jannifer Franklin

## 2016-05-20 NOTE — Progress Notes (Signed)
CC:  Chief Complaint  Patient presents with  . Head Injury    HPI: Daisy Bennett is a 60 y.o. female admitted through the ED after outpatient CT demonstrated SDH. She was apparently at the gym on Monday (4 days ago), Tripped over a bench, and fell hitting the back of her head. At home, she had a significant headache, and came to the emergency department. She said she saw the triage nurse, but was waiting in the waiting room for at least 90 minutes. Her headaches slowly got better, and she therefore left the emergency department. She was seen by her primary care physician on Tuesday morning, where she had a episode of dizziness, slurred speech, and was disoriented. Her primary care physician ordered an outpatient CT scan which did demonstrate a subdural hematoma and she was instructed to come to the emergency department. Today, she is reporting continued left-sided retro-orbital headache, which is relatively severe. She does state light bothers her. She has not had changes in her vision, or new numbness tingling or weakness. She has been up to use the bathroom. Of note, she does have a fairly significant medical history including hypertension, diabetes, and previous heart attacks. She has cardiac stents placed in 2011, and since then has been on aspirin and prasugrel.  PMH: Past Medical History:  Diagnosis Date  . Basal cell carcinoma   . Cancer ([place])    basal and squamous cell skin cancer  . DM (diabetes mellitus) ([place])   . Goiter, unspecified   . History of colonic polyps 2012  . Multiple sclerosis ([place])   . Non-ST elevated myocardial infarction (non-STEMI) ([place])   . SCCA (squamous cell carcinoma) of skin    right shin  . SEMI (subendocardial myocardial infarction) ([place]) 09/29/09   TOTAL RCA WITH STENT TO OM BRANCH  . Shingles   . Squamous cell carcinoma   . Tobacco abuse   . Unspecified essential hypertension   . Unspecified hypothyroidism     PSH: Past Surgical History:   Procedure Laterality Date  . CATARACT SURGERY  12/10  . CESAREAN SECTION    . CORONARY ANGIOPLASTY WITH STENT PLACEMENT    . TONSILLECTOMY    . VESICOVAGINAL FISTULA CLOSURE W/ TAH      SH: Social History  Substance Use Topics  . Smoking status: Former Smoker    Packs/day: 1.00    Quit date: 12/16/2015  . Smokeless tobacco: Never Used  . Alcohol use 0.0 oz/week     Comment: OCCASIONAL ALCOHOL USE.    MEDS: Prior to Admission medications   Medication Sig Start Date End Date Taking? Authorizing Provider  amitriptyline (ELAVIL) 50 MG tablet Take 1 tablet (50 mg total) by mouth daily. Patient taking differently: Take 50 mg by mouth at bedtime.  12/24/15  Yes Kathrynn Ducking, MD  amLODipine (NORVASC) 10 MG tablet Take 1 tablet (10 mg total) by mouth daily. 05/19/15  Yes Josue Hector, MD  aspirin EC 325 MG EC tablet Take 325 mg by mouth daily.    Yes Historical Provider, MD  carvedilol (COREG) 6.25 MG tablet Take 1 tablet (6.25 mg total) by mouth 2 (two) times daily with a meal. 06/17/15  Yes Josue Hector, MD  cholecalciferol (VITAMIN D) 1000 units tablet Take 1,000 Units by mouth daily.   Yes Historical Provider, MD  CINNAMON PO Take 2,000 mg by mouth daily.    Yes Historical Provider, MD  Dimethyl Fumarate (TECFIDERA) 240 MG CPDR Take 1 capsule (240  mg total) by mouth 2 (two) times daily. 09/18/15  Yes Kathrynn Ducking, MD  esomeprazole (NEXIUM) 20 MG capsule Take 20 mg by mouth daily at 12 noon.   Yes Historical Provider, MD  fenofibrate 160 MG tablet Take 1 tablet (160 mg total) by mouth daily. 03/02/16  Yes Renee A Kuneff, DO  Ferrous Sulfate (IRON) 325 (65 FE) MG TABS Take 325 mg by mouth daily.    Yes Historical Provider, MD  fish oil-omega-3 fatty acids 1000 MG capsule Take 1 g by mouth daily.    Yes Historical Provider, MD  gabapentin (NEURONTIN) 600 MG tablet Take 1 tablet (600 mg total) by mouth 2 (two) times daily. 04/07/16  Yes Kathrynn Ducking, MD  glimepiride (AMARYL) 2 MG  tablet Take 1 tablet (2 mg total) by mouth 2 (two) times daily. 03/26/16  Yes Renee A Kuneff, DO  hydrochlorothiazide (HYDRODIURIL) 25 MG tablet Take 1 tablet (25 mg total) by mouth daily. 05/12/16  Yes Renee A Kuneff, DO  levothyroxine (SYNTHROID, LEVOTHROID) 100 MCG tablet Take 1 tablet (100 mcg total) by mouth daily. 10/14/15  Yes Renee A Kuneff, DO  losartan-hydrochlorothiazide (HYZAAR) 100-25 MG tablet Take 1 tablet by mouth daily. 03/18/16  Yes Josue Hector, MD  metFORMIN (GLUCOPHAGE) 1000 MG tablet Take 1 tablet (1,000 mg total) by mouth 2 (two) times daily with a meal. 02/10/16  Yes Renee A Kuneff, DO  Multiple Vitamin (MULTIVITAMINS PO) Take 1 tablet by mouth at bedtime.    Yes Historical Provider, MD  nitroGLYCERIN (NITROSTAT) 0.4 MG SL tablet Place 1 tablet (0.4 mg total) under the tongue every 5 (five) minutes as needed for chest pain. 09/18/14 05/19/16 Yes Josue Hector, MD  prasugrel (EFFIENT) 10 MG TABS tablet Take 1 tablet (10 mg total) by mouth daily. 03/18/16  Yes Josue Hector, MD  rosuvastatin (CRESTOR) 10 MG tablet Take 1 tablet (10 mg total) by mouth daily. 03/18/16  Yes Josue Hector, MD  venlafaxine (EFFEXOR-XR) 75 MG 24 hr capsule Take 75 mg by mouth daily.     Yes Historical Provider, MD  acetaminophen (TYLENOL) 325 MG tablet Take 2 tablets (650 mg total) by mouth every 6 (six) hours as needed for fever, headache or moderate pain. 05/20/16   Wallis Bamberg, MD  glucose blood test strip DX:E11.9, monitor fasting BG daily. Patient not taking: Reported on 05/19/2016 04/16/15   Renee A Kuneff, DO    ALLERGY: Allergies  Allergen Reactions  . Betaseron [Interferon Beta-1b] Other (See Comments)    Increased LFT's  . Doxycycline Other (See Comments)    Thrush,dizziness    ROS: ROS  NEUROLOGIC EXAM: Awake, alert, oriented Memory and concentration grossly intact Speech fluent, appropriate CN grossly intact Motor exam: Upper Extremities Deltoid Bicep Tricep Grip   Right 5/5 5/5 5/5 5/5  Left 5/5 5/5 5/5 5/5   Lower Extremity IP Quad PF DF EHL  Right 5/5 5/5 5/5 5/5 5/5  Left 5/5 5/5 5/5 5/5 5/5   Sensation grossly intact to LT  IMGAING: CTH reviewed which demonstrates left anterior fall seen subdural, as well as a small left high convexity subdural hematoma. There is minimal mass effect, no hydrocephalus, minimal left-to-right midline shift of approximately 3-4 mm.  IMPRESSION: - 60 y.o. female status post fall with primarily left fall seen subdural hematoma. She has a postconcussive syndrome, but is neurologically intact. The subdural is nonoperative.  PLAN: - I would put her on Keppra 500 mg twice a day  for 7 days - She can follow-up with me or with Dr. Jannifer Franklin, her primary neurologist, in a few weeks - Would plan on keeping her off the aspirin and Effient until her follow-up

## 2016-05-20 NOTE — Progress Notes (Signed)
PROGRESS NOTE    Daisy Bennett  [id number] DOB: 1956-03-05 DOA: 05/18/2016 PCP: Howard Pouch, DO    Brief Narrative:  Daisy Bennett is a 60 y.o. female with medical history significant of hypertension, hyperlipidemia, diabetes mellitus, GERD, hypothyroidism, depression, CAD, non-STEMI, multiple sclerosis, skin cancer, who presents with fall and head injury.  Patient states that she tripped her steps and fell while she was doing exercise in gym at all about 6 PM. No prodromal symptoms, such as unilateral weakness, chest pain, dizziness, vision change, hearing loss. Patient states that she developed a severe headache over frontal and occipital area, which is the worst pain in her life. Her headache has gradually improved, currently 2 out of 10 in severity, nonradiating. It is aggravated by moving. Patient denies fever, chills, shortness of breath, cough, nausea, vomiting, diarrhea, abdominal pain, symptoms of UTI.  Patient was found to have WBC 13.1, INR 0.95, potassium 3.1, creatinine normal, temperature normal, no tachycardia, CT head showed Acute subdural hematoma on the left. This involves the interhemispheric fissure as well as the left hemisphere most notably over the convexity. This measures up to 7 mm in thickness. 3 mm midline shift to the right. Pt is placed on tele bed for obs. Neurosurgeon, Dr. Cyndy Freeze was consulted.   Assessment & Plan:   Principal Problem:   Subdural hematoma (HCC) Active Problems:   Hypothyroidism   Diabetes type 2, controlled ([place])   Mixed hyperlipidemia   Multiple sclerosis (HCC)   Essential hypertension   Coronary atherosclerosis   Head injury   Antiplatelet or antithrombotic long-term use   Fall   Hypokalemia   SDH (subdural hematoma) ([place]): as evidenced by Ct-head. There is mild 3 mm midline shift to the right. No focal neurological findings on physical examination. Her headache has improved. Neurosurgeon, Dr. Cyndy Freeze was consulted. -will  place on tele bed -Frequent neuro check -Hold aspirin and Effient -repeat CT-head at 11:00 am showed no increase in SDH -When necessary Tylenol and Percocet for pain -f/u neurosurgeon's recommendations: appreciate their input - patient on tylenol and percocet for headache - can add a PRN morphine for headache as would like to avoid NSAIDs and aspirin at this time  Hypothyroidism: Last TSH was 1.78 on 10/14/15 -Continue home Synthroid  DM-II: Last A1c 6.5 on 05/13/16, well controled. Patient is taking metformin and Amaryl at home -SSI  HLD: Last LDL was noted on record, TG 726 on 09/30/09 -Continue home medications: Fenofibrate and Crestor  Multiple sclerosis ([place]): stable -continue home T[employer]  HTN: Blood pressure 161/83 -continue amlodipine, Coreg, HCTZ, Hyzaar -IV hydralazine.  Coronary atherosclerosis: s/p stent. No CP -hold ASA and Effient -Continue Coreg and Crestor  Hypokalemia: K= 3.1 on admission. - Repeat this am at 3.8 - KCl 39mEq PO BID - Mg low- will order MagTab   DVT ppx: SCD Code Status: Full code Family Communication: Yes, patient's husband and sister in law at bed side Disposition Plan:  Anticipate discharge after clearance by neurosurgery   Consultants:   Neurosurgery  Procedures:   None  Antimicrobials:   None    Subjective: Patient seen.  Room is dark.  Patient stating she has significant headache.  Wants to sleep most of today.  Denies nausea, vomiting, vision changes, abdominal pain, weakness, numbness, unsteady gait.  Patient voices headache is worst it has been since being in the hospital. Patient's husband voices he would like to sit and discuss care with the neurosurgeon.  Has many concerns revolving around going  home.  Objective: Vitals:   05/20/16 0048 05/20/16 0541 05/20/16 1033 05/20/16 1300  BP: (!) 147/64 (!) 150/66 139/62 (!) 154/73  Pulse: 77 71 79 72  Resp: 16 16 17 18   Temp: 98.5 F (36.9 C) 98.7 F (37.1 C)  97.8 F (36.6 C) 98.3 F (36.8 C)  TempSrc: Oral Oral Oral Oral  SpO2: 94% 95% 98% 97%  Weight:      Height:       No intake or output data in the 24 hours ending 05/20/16 1516 Filed Weights   05/18/16 1250 05/18/16 1930  Weight: 86.6 kg (191 lb) 85.3 kg (188 lb 0.8 oz)    Examination:  General exam: Appears calm and comfortable  Respiratory system: Clear to auscultation. Respiratory effort normal. Cardiovascular system: S1 & S2 heard, RRR. No JVD, murmurs, rubs, gallops or clicks. No pedal edema. Gastrointestinal system: Abdomen is nondistended, soft and nontender. No organomegaly or masses felt. Normal bowel sounds heard. Central nervous system: Alert and oriented. No focal neurological deficits. Extremities: Symmetric 5 x 5 power. Skin: No rashes, lesions or ulcers, no open lesions Psychiatry: Judgement and insight appear normal. Mood & affect appropriate.     Data Reviewed: I have personally reviewed following labs and imaging studies  CBC:  Recent Labs Lab 05/18/16 1305 05/19/16 0413  WBC 13.1* 9.1  NEUTROABS 11.2*  --   HGB 12.8 12.3  HCT 36.2 36.0  MCV 93.1 93.3  PLT 201 0000000   Basic Metabolic Panel:  Recent Labs Lab 05/18/16 1305 05/19/16 0413 05/20/16 0448  NA 133* 136 137  K 3.1* 3.2* 3.8  CL 96* 99* 101  CO2 27 27 28   GLUCOSE 113* 145* 150*  BUN 8 5* 7  CREATININE 0.68 0.74 0.69  CALCIUM 9.7 9.3 9.3  MG  --  1.5*  --    GFR: Estimated Creatinine Clearance: 81.3 mL/min (by C-G formula based on SCr of 0.69 mg/dL). Liver Function Tests: No results for input(s): AST, ALT, ALKPHOS, BILITOT, PROT, ALBUMIN in the last 168 hours. No results for input(s): LIPASE, AMYLASE in the last 168 hours. No results for input(s): AMMONIA in the last 168 hours. Coagulation Profile:  Recent Labs Lab 05/18/16 1305  INR 0.95   Cardiac Enzymes: No results for input(s): CKTOTAL, CKMB, CKMBINDEX, TROPONINI in the last 168 hours. BNP (last 3 results) No results  for input(s): PROBNP in the last 8760 hours. HbA1C: No results for input(s): HGBA1C in the last 72 hours. CBG:  Recent Labs Lab 05/19/16 1718 05/19/16 2102 05/19/16 2108 05/20/16 0616 05/20/16 1147  GLUCAP 137* 213* 180* 137* 192*   Lipid Profile: No results for input(s): CHOL, HDL, LDLCALC, TRIG, CHOLHDL, LDLDIRECT in the last 72 hours. Thyroid Function Tests: No results for input(s): TSH, T4TOTAL, FREET4, T3FREE, THYROIDAB in the last 72 hours. Anemia Panel: No results for input(s): VITAMINB12, FOLATE, FERRITIN, TIBC, IRON, RETICCTPCT in the last 72 hours. Sepsis Labs: No results for input(s): PROCALCITON, LATICACIDVEN in the last 168 hours.  No results found for this or any previous visit (from the past 240 hour(s)).       Radiology Studies: Ct Head Wo Contrast  Result Date: 05/19/2016 CLINICAL DATA:  Follow-up subdural hematoma. Fall 2 days ago. Headache. EXAM: CT HEAD WITHOUT CONTRAST TECHNIQUE: Contiguous axial images were obtained from the base of the skull through the vertex without intravenous contrast. COMPARISON:  05/18/2016 FINDINGS: Brain: Small acute subdural hematoma over the left cerebral convexity in the frontal, parietal, and temporal regions  is unchanged. Subdural hematoma extending along the left aspect of the falx is also unchanged and measures up to 8 mm in thickness. Rightward midline shift measures 4 mm, not significantly changed. There is no evidence of acute cortical infarct, mass, or hydrocephalus. Vascular: Atherosclerotic calcification involving the carotid siphons. Skull: No skull fracture or focal osseous lesion identified. Sinuses/Orbits: Scattered, mild paranasal sinus mucosal thickening. Clear mastoid air cells. Prior bilateral cataract extraction. Other: Unchanged posterior scalp soft tissue swelling. IMPRESSION: Unchanged left-sided subdural hemorrhage and mild rightward midline shift. Electronically Signed   By: Logan Bores M.D.   On: 05/19/2016  12:37        Scheduled Meds: . amitriptyline  50 mg Oral QHS  . amLODipine  10 mg Oral Daily  . carvedilol  6.25 mg Oral BID WC  . cholecalciferol  1,000 Units Oral Daily  . Dimethyl Fumarate  240 mg Oral BID  . fenofibrate  160 mg Oral Daily  . ferrous sulfate  325 mg Oral Q breakfast  . gabapentin  600 mg Oral BID  . hydrochlorothiazide  25 mg Oral Daily  . insulin aspart  0-5 Units Subcutaneous QHS  . insulin aspart  0-9 Units Subcutaneous TID WC  . levothyroxine  100 mcg Oral QAC breakfast  . losartan  100 mg Oral Daily  . magnesium chloride  1 tablet Oral Daily  . multivitamin with minerals  1 tablet Oral Daily  . mupirocin ointment   Topical Daily  . omega-3 acid ethyl esters  1 g Oral BID  . pantoprazole  40 mg Oral Daily  . potassium chloride  30 mEq Oral BID  . rosuvastatin  10 mg Oral q1800  . sodium chloride flush  3 mL Intravenous Q12H  . venlafaxine XR  75 mg Oral Daily   Continuous Infusions: . sodium chloride 75 mL/hr at 05/18/16 2227     LOS: 1 day    Time spent: 40 minutes    Newman Pies, MD [facility] Pager [phone]  If 7PM-7AM, please contact night-coverage [email or web address] Password [email or web address] 05/20/2016, 3:16 PM

## 2016-05-20 NOTE — Discharge Summary (Signed)
Physician Discharge Summary  Daisy Bennett [id number] DOB: 09-30-1955 DOA: 05/18/2016  PCP: Howard Pouch, DO  Admit date: 05/18/2016 Discharge date: 05/20/2016  Admitted From: Home Disposition:   Home  Recommendations for Outpatient Follow-up:  1. Follow up with PCP in 1-2 weeks 2. Follow up with your neurologist or the neurosurgeon within 4 weeks for repeat CT scan 3. Can restart aspirin and Efficent in 5 days (1 week from fall)  Home Health: No Equipment/Devices: None   Discharge Condition: Stable CODE STATUS:Full code  Diet recommendation: Heart Healthy / Carb Modified  Brief/Interim Summary: Daisy Bennett a 60 y.o.femalewith medical history significant of hypertension, hyperlipidemia, diabetes mellitus, GERD, hypothyroidism, depression, CAD, non-STEMI, multiple sclerosis, skin cancer, who presents with fall and head injury.  Patient states that she tripped her steps andfell while she was doing exercise in gym at all about 6 PM. No prodromal symptoms, such as unilateral weakness, chest pain, dizziness, vision change, hearing loss. Patient states that she developed a severe headache over frontal and occipital area, which is theworst pain in her life. Her headache has gradually improved, currently 2 out of 10 in severity, nonradiating. It is aggravated by moving. Patient denies fever, chills, shortness of breath, cough, nausea, vomiting, diarrhea, abdominal pain, symptoms of UTI.  Patient was found to have WBC 13.1, INR 0.95,potassium 3.1, creatinine normal, temperature normal, no tachycardia, CT head showedAcute subdural hematoma on the left. This involves the interhemispheric fissure as well as the left hemisphere most notably over the convexity. This measures up to 7 mm in thickness. 3 mm midline shift to the right. Pt is placed on tele bed for obs. Neurosurgeon, Dr. Judie Bonus consulted.  Discharge Diagnoses:  Principal Problem:   SDH (subdural hematoma)  (HCC) Active Problems:   Hypothyroidism   Diabetes type 2, controlled (HCC)   Mixed hyperlipidemia   Multiple sclerosis (HCC)   Essential hypertension   Coronary atherosclerosis   Head injury   Antiplatelet or antithrombotic long-term use   Fall   Hypokalemia    Discharge Instructions  Discharge Instructions    Call MD for:  difficulty breathing, headache or visual disturbances    Complete by:  As directed    Call MD for:  extreme fatigue    Complete by:  As directed    Call MD for:  persistant dizziness or light-headedness    Complete by:  As directed    Call MD for:  persistant nausea and vomiting    Complete by:  As directed    Call MD for:  severe uncontrolled pain    Complete by:  As directed    Diet - low sodium heart healthy    Complete by:  As directed    Increase activity slowly    Complete by:  As directed        Medication List    STOP taking these medications   hydrochlorothiazide 25 MG tablet Commonly known as:  HYDRODIURIL     TAKE these medications   acetaminophen 325 MG tablet Commonly known as:  TYLENOL Take 2 tablets (650 mg total) by mouth every 6 (six) hours as needed for fever, headache or moderate pain.   amitriptyline 50 MG tablet Commonly known as:  ELAVIL Take 1 tablet (50 mg total) by mouth daily. What changed:  when to take this   amLODipine 10 MG tablet Commonly known as:  NORVASC Take 1 tablet (10 mg total) by mouth daily.   aspirin EC 325 MG tablet Take 325  mg by mouth daily.   carvedilol 6.25 MG tablet Commonly known as:  COREG Take 1 tablet (6.25 mg total) by mouth 2 (two) times daily with a meal.   cholecalciferol 1000 units tablet Commonly known as:  VITAMIN D Take 1,000 Units by mouth daily.   CINNAMON PO Take 2,000 mg by mouth daily.   Dimethyl Fumarate 240 MG Cpdr Commonly known as:  TECFIDERA Take 1 capsule (240 mg total) by mouth 2 (two) times daily.   esomeprazole 20 MG capsule Commonly known as:   NEXIUM Take 20 mg by mouth daily at 12 noon.   fenofibrate 160 MG tablet Take 1 tablet (160 mg total) by mouth daily.   fish oil-omega-3 fatty acids 1000 MG capsule Take 1 g by mouth daily.   gabapentin 600 MG tablet Commonly known as:  NEURONTIN Take 1 tablet (600 mg total) by mouth 2 (two) times daily.   glimepiride 2 MG tablet Commonly known as:  AMARYL Take 1 tablet (2 mg total) by mouth 2 (two) times daily.   glucose blood test strip DX:E11.9, monitor fasting BG daily.   Iron 325 (65 Fe) MG Tabs Take 325 mg by mouth daily.   levothyroxine 100 MCG tablet Commonly known as:  SYNTHROID, LEVOTHROID Take 1 tablet (100 mcg total) by mouth daily.   losartan-hydrochlorothiazide 100-25 MG tablet Commonly known as:  HYZAAR Take 1 tablet by mouth daily.   metFORMIN 1000 MG tablet Commonly known as:  GLUCOPHAGE Take 1 tablet (1,000 mg total) by mouth 2 (two) times daily with a meal.   MULTIVITAMINS PO Take 1 tablet by mouth at bedtime.   nitroGLYCERIN 0.4 MG SL tablet Commonly known as:  NITROSTAT Place 1 tablet (0.4 mg total) under the tongue every 5 (five) minutes as needed for chest pain.   prasugrel 10 MG Tabs tablet Commonly known as:  EFFIENT Take 1 tablet (10 mg total) by mouth daily.   rosuvastatin 10 MG tablet Commonly known as:  CRESTOR Take 1 tablet (10 mg total) by mouth daily.   venlafaxine XR 75 MG 24 hr capsule Commonly known as:  EFFEXOR-XR Take 75 mg by mouth daily.      Follow-up Information    Kevan Ny Ditty, MD Follow up in 4 week(s).   Specialty:  Neurosurgery Why:  with head CT Contact information: [address] [place] [address] [phone]        Howard Pouch, DO. Schedule an appointment as soon as possible for a visit in 2 week(s).   Specialty:  Family Medicine Contact information: [address] [place] [address] [phone]          Allergies  Allergen Reactions  . Betaseron [Interferon  Beta-1b] Other (See Comments)    Increased LFT's  . Doxycycline Other (See Comments)    Thrush,dizziness    Consultations:  Neurosurgery   Procedures/Studies: Ct Head Wo Contrast  Result Date: 05/19/2016 CLINICAL DATA:  Follow-up subdural hematoma. Fall 2 days ago. Headache. EXAM: CT HEAD WITHOUT CONTRAST TECHNIQUE: Contiguous axial images were obtained from the base of the skull through the vertex without intravenous contrast. COMPARISON:  05/18/2016 FINDINGS: Brain: Small acute subdural hematoma over the left cerebral convexity in the frontal, parietal, and temporal regions is unchanged. Subdural hematoma extending along the left aspect of the falx is also unchanged and measures up to 8 mm in thickness. Rightward midline shift measures 4 mm, not significantly changed. There is no evidence of acute cortical infarct, mass,  or hydrocephalus. Vascular: Atherosclerotic calcification involving the carotid siphons. Skull: No skull fracture or focal osseous lesion identified. Sinuses/Orbits: Scattered, mild paranasal sinus mucosal thickening. Clear mastoid air cells. Prior bilateral cataract extraction. Other: Unchanged posterior scalp soft tissue swelling. IMPRESSION: Unchanged left-sided subdural hemorrhage and mild rightward midline shift. Electronically Signed   By: Logan Bores M.D.   On: 05/19/2016 12:37   Ct Head Wo Contrast  Addendum Date: 05/18/2016   ADDENDUM REPORT: 05/18/2016 12:23 ADDENDUM: Critical Value/emergent results were called by telephone at the time of interpretation on 05/18/2016 at 12:23 pm to Dr. Howard Pouch , who verbally acknowledged these results. Electronically Signed   By: Franchot Gallo M.D.   On: 05/18/2016 12:23   Result Date: 05/18/2016 CLINICAL DATA:  [place] yesterday. Head injury. On blood thinner. Headache. EXAM: CT HEAD WITHOUT CONTRAST TECHNIQUE: Contiguous axial images were obtained from the base of the skull through the vertex without intravenous contrast.  COMPARISON:  MRI head 04/11/2016 FINDINGS: Brain: Acute left subdural hematomas are present. 7 mm thick interhemispheric subdural hematoma anteriorly on the left. Left hemispheric subdural hematoma involving the left temporal lobe, and left frontal and parietal lobe. Much of this is over the convexity as well as some in the temporal lobe. This measures up to 7 mm in thickness in the left frontal convexity and approximately 3 mm in the temporal lobe. 3 mm midline shift towards the right due to mass-effect. Negative for hydrocephalus. No acute infarct or mass. Vascular: No hyperdense vessel or unexpected calcification. Skull: Negative for fracture. Sinuses/Orbits: Mild mucosal thickening left lateral sphenoid sinus. Remaining sinuses clear. Other: None IMPRESSION: Acute subdural hematoma on the left. This involves the interhemispheric fissure as well as the left hemisphere most notably over the convexity. This measures up to 7 mm in thickness. 3 mm midline shift to the right. Electronically Signed: By: Franchot Gallo M.D. On: 05/18/2016 12:19        Subjective: Patient feels more comfortable discharging after talking to neurosurgeon.  Understands she will hold her blood thinner for 5 days and then can restart.  Understands headaches will likely continue.  Will see if her neurologist can manage her outpatient otherwise will follow up with neurosurgery for repeat CT scan in 4 weeks.  Discharge Exam: Vitals:   05/20/16 1300 05/20/16 1604  BP: (!) 154/73 (!) 145/65  Pulse: 72 72  Resp: 18   Temp: 98.3 F (36.8 C)    Vitals:   05/20/16 0541 05/20/16 1033 05/20/16 1300 05/20/16 1604  BP: (!) 150/66 139/62 (!) 154/73 (!) 145/65  Pulse: 71 79 72 72  Resp: 16 17 18    Temp: 98.7 F (37.1 C) 97.8 F (36.6 C) 98.3 F (36.8 C)   TempSrc: Oral Oral Oral   SpO2: 95% 98% 97%   Weight:      Height:        General: Pt is alert, awake, not in acute distress Cardiovascular: RRR, S1/S2 +, no rubs, no  gallops Respiratory: CTA bilaterally, no wheezing, no rhonchi Abdominal: Soft, NT, ND, bowel sounds + Extremities: no edema, no cyanosis    The results of significant diagnostics from this hospitalization (including imaging, microbiology, ancillary and laboratory) are listed below for reference.     Microbiology: No results found for this or any previous visit (from the past 240 hour(s)).   Labs: BNP (last 3 results) No results for input(s): BNP in the last 8760 hours. Basic Metabolic Panel:  Recent Labs Lab 05/18/16 1305 05/19/16 0413  05/20/16 0448  NA 133* 136 137  K 3.1* 3.2* 3.8  CL 96* 99* 101  CO2 27 27 28   GLUCOSE 113* 145* 150*  BUN 8 5* 7  CREATININE 0.68 0.74 0.69  CALCIUM 9.7 9.3 9.3  MG  --  1.5*  --    Liver Function Tests: No results for input(s): AST, ALT, ALKPHOS, BILITOT, PROT, ALBUMIN in the last 168 hours. No results for input(s): LIPASE, AMYLASE in the last 168 hours. No results for input(s): AMMONIA in the last 168 hours. CBC:  Recent Labs Lab 05/18/16 1305 05/19/16 0413  WBC 13.1* 9.1  NEUTROABS 11.2*  --   HGB 12.8 12.3  HCT 36.2 36.0  MCV 93.1 93.3  PLT 201 195   Cardiac Enzymes: No results for input(s): CKTOTAL, CKMB, CKMBINDEX, TROPONINI in the last 168 hours. BNP: Invalid input(s): POCBNP CBG:  Recent Labs Lab 05/19/16 1718 05/19/16 2102 05/19/16 2108 05/20/16 0616 05/20/16 1147  GLUCAP 137* 213* 180* 137* 192*   D-Dimer No results for input(s): DDIMER in the last 72 hours. Hgb A1c No results for input(s): HGBA1C in the last 72 hours. Lipid Profile No results for input(s): CHOL, HDL, LDLCALC, TRIG, CHOLHDL, LDLDIRECT in the last 72 hours. Thyroid function studies No results for input(s): TSH, T4TOTAL, T3FREE, THYROIDAB in the last 72 hours.  Invalid input(s): FREET3 Anemia work up No results for input(s): VITAMINB12, FOLATE, FERRITIN, TIBC, IRON, RETICCTPCT in the last 72 hours. Urinalysis    Component Value  Date/Time   COLORURINE YELLOW 09/29/2009 1157   APPEARANCEUR CLEAR 09/29/2009 1157   LABSPEC 1.003 (L) 09/29/2009 1157   PHURINE 5.5 09/29/2009 1157   GLUCOSEU NEGATIVE 09/29/2009 1157   HGBUR NEGATIVE 09/29/2009 1157   BILIRUBINUR NEGATIVE 09/29/2009 1157   KETONESUR NEGATIVE 09/29/2009 1157   PROTEINUR NEGATIVE 09/29/2009 1157   UROBILINOGEN 0.2 09/29/2009 1157   NITRITE NEGATIVE 09/29/2009 1157   LEUKOCYTESUR  09/29/2009 1157    NEGATIVE MICROSCOPIC NOT DONE ON URINES WITH NEGATIVE PROTEIN, BLOOD, LEUKOCYTES, NITRITE, OR GLUCOSE <1000 mg/dL.   Sepsis Labs Invalid input(s): PROCALCITONIN,  WBC,  LACTICIDVEN Microbiology No results found for this or any previous visit (from the past 240 hour(s)).   Time coordinating discharge:  Less than 30 minutes  SIGNED:   Newman Pies, MD  [facility] 05/20/2016, 4:20 PM Pager [phone] If 7PM-7AM, please contact night-coverage [email or web address] Password [email or web address]

## 2016-05-20 NOTE — Care Management Note (Signed)
Case Management Note  Patient Details  Name: Daisy Bennett MRN: [id number] Date of Birth: [birth year]  Subjective/Objective:     Pt admitted with subdural hematoma. She is from home with her husband.               Action/Plan: Await Neurosurgery. CM following for d/c needs.  Expected Discharge Date:                  Expected Discharge Plan:  Home/Self Care  In-House Referral:     Discharge planning Services     Post Acute Care Choice:    Choice offered to:     DME Arranged:    DME Agency:     HH Arranged:    HH Agency:     Status of Service:  In process, will continue to follow  If discussed at Long Length of Stay Meetings, dates discussed:    Additional Comments:  Pollie Friar, RN 05/20/2016, 12:17 PM

## 2016-05-20 NOTE — Telephone Encounter (Signed)
I called patient. She was discharged today following the fall, she has a interfalcine subdural hematoma, this likely will not require surgery. We will recheck a CT in 3 weeks, I will send her prescription for Percocet for her headache, keep her out of work until 06/07/2016, a note was given for work.

## 2016-05-21 ENCOUNTER — Telehealth: Payer: Self-pay | Admitting: *Deleted

## 2016-05-21 MED ORDER — OXYCODONE HCL 10 MG PO TABS: 10.0000 mg | ORAL_TABLET | Freq: Three times a day (TID) | ORAL | 0 refills | Status: DC | PRN

## 2016-05-21 NOTE — Telephone Encounter (Signed)
Rx and letter printed, signed, up front for pick-up.

## 2016-05-21 NOTE — Telephone Encounter (Signed)
I will bridge her with her pain pills for the weekend. She will need to pick up neurologist script on Monday. Husband can pick up script before closing today: Oxycodone 10 mg TID PRN #10.

## 2016-05-21 NOTE — Telephone Encounter (Signed)
Patient husband called states patient was discharged last night from [facility]. He states neuro surgeon forgot to give her Rx for pain med. They called his office and they stated they would have a Rx for percocet for them to pick up from office but when he went to their office they had closed at noon today. He is requesting a Rx for percocet for her to get through the weekend. Please advise.

## 2016-05-25 ENCOUNTER — Encounter: Payer: Self-pay | Admitting: Neurology

## 2016-05-26 ENCOUNTER — Other Ambulatory Visit: Payer: Self-pay | Admitting: Neurology

## 2016-05-26 MED ORDER — TOPIRAMATE 25 MG PO TABS: ORAL_TABLET | ORAL | 3 refills | Status: DC

## 2016-05-27 ENCOUNTER — Other Ambulatory Visit: Payer: Self-pay | Admitting: Neurology

## 2016-05-31 ENCOUNTER — Other Ambulatory Visit: Payer: Self-pay | Admitting: Neurology

## 2016-05-31 MED ORDER — TRAMADOL HCL 50 MG PO TABS: 50.0000 mg | ORAL_TABLET | Freq: Four times a day (QID) | ORAL | 1 refills | Status: DC | PRN

## 2016-06-04 ENCOUNTER — Telehealth: Payer: Self-pay | Admitting: Family Medicine

## 2016-06-04 ENCOUNTER — Ambulatory Visit (INDEPENDENT_AMBULATORY_CARE_PROVIDER_SITE_OTHER): Payer: 59 | Admitting: Family Medicine

## 2016-06-04 ENCOUNTER — Encounter: Payer: Self-pay | Admitting: Family Medicine

## 2016-06-04 ENCOUNTER — Other Ambulatory Visit (INDEPENDENT_AMBULATORY_CARE_PROVIDER_SITE_OTHER): Payer: 59

## 2016-06-04 ENCOUNTER — Telehealth: Payer: Self-pay | Admitting: Neurology

## 2016-06-04 VITALS — BP 109/71 | HR 66 | Temp 98.1°F | Resp 20 | Ht 66.0 in | Wt 190.2 lb

## 2016-06-04 DIAGNOSIS — Z6830 Body mass index (BMI) 30.0-30.9, adult: Secondary | ICD-10-CM

## 2016-06-04 DIAGNOSIS — Z1329 Encounter for screening for other suspected endocrine disorder: Secondary | ICD-10-CM

## 2016-06-04 DIAGNOSIS — I6200 Nontraumatic subdural hemorrhage, unspecified: Secondary | ICD-10-CM

## 2016-06-04 DIAGNOSIS — Z860100 Personal history of colon polyps, unspecified: Secondary | ICD-10-CM

## 2016-06-04 DIAGNOSIS — S065XAA Traumatic subdural hemorrhage with loss of consciousness status unknown, initial encounter: Secondary | ICD-10-CM

## 2016-06-04 DIAGNOSIS — Z13220 Encounter for screening for lipoid disorders: Secondary | ICD-10-CM

## 2016-06-04 DIAGNOSIS — S065X9A Traumatic subdural hemorrhage with loss of consciousness of unspecified duration, initial encounter: Secondary | ICD-10-CM

## 2016-06-04 DIAGNOSIS — Z0000 Encounter for general adult medical examination without abnormal findings: Secondary | ICD-10-CM | POA: Diagnosis not present

## 2016-06-04 DIAGNOSIS — Z1211 Encounter for screening for malignant neoplasm of colon: Secondary | ICD-10-CM

## 2016-06-04 LAB — LIPID PANEL
Cholesterol: 100 mg/dL (ref 0–200)
HDL: 44.4 mg/dL (ref >39.00)
LDL Cholesterol: 30 mg/dL (ref 0–99)
NonHDL: 55.36
Total CHOL/HDL Ratio: 2
Triglycerides: 126 mg/dL (ref 0.0–149.0)
VLDL: 25.2 mg/dL (ref 0.0–40.0)

## 2016-06-04 LAB — TSH: TSH: 4.43 u[IU]/mL (ref 0.35–4.50)

## 2016-06-04 NOTE — Telephone Encounter (Signed)
Left message with results on patient voice mail per DPR 

## 2016-06-04 NOTE — Progress Notes (Signed)
Patient ID: Daisy Bennett, female  DOB: Feb 19, 1956, 60 y.o.   MRN: [id number] Patient Care Team    Relationship Specialty Notifications Start End  Ma Hillock, DO PCP - General Family Medicine  03/10/15   Devra Dopp, MD Referring Physician Dermatology  02/10/16   Kathrynn Ducking, MD Consulting Physician Neurology  05/18/16   Linda Hedges, DO Consulting Physician Obstetrics and Gynecology  06/04/16     Subjective:  Daisy Bennett is a 60 y.o.  Female  present for CPE. All past medical history, surgical history, allergies, family history, immunizations, medications and social history were updated in the electronic medical record today. All recent labs, ED visits and hospitalizations within the last year were reviewed.  Health maintenance:  Colonoscopy: completed 01/2011, by [facility]. follow up 5 years due in 2017 Mammogram: [facility]  Cervical cancer screening: last pap: UTD with GYN Immunizations: tdap 2015, Influenza 2017 (encouraged yearly), PNA series prevnar completed PSV23 due 09/2016, , zostavax completed Infectious disease screening: HIV and  Hep C completed DEXA: [facility] and normal 2017. Assistive device: None Oxygen XYI:AXKP Patient has a Dental home. Hospitalizations/ED visits: none  Immunization History  Administered Date(s) Administered  . Influenza,inj,Quad PF,36+ Mos 04/16/2015, 05/12/2016  . Pneumococcal Polysaccharide-23 10/14/2015  . Tdap 12/26/2013     Past Medical History:  Diagnosis Date  . Basal cell carcinoma   . Cancer ([place])    basal and squamous cell skin cancer  . DM (diabetes mellitus) ([place])   . Goiter, unspecified   . History of colonic polyps 2012  . Multiple sclerosis ([place])   . Non-ST elevated myocardial infarction (non-STEMI) ([place])   . SCCA (squamous cell carcinoma) of skin    right shin  . SEMI (subendocardial myocardial infarction) ([place]) 09/29/09   TOTAL  RCA WITH STENT TO OM BRANCH  . Shingles   . Squamous cell carcinoma   . Tobacco abuse   . Unspecified essential hypertension   . Unspecified hypothyroidism    Allergies  Allergen Reactions  . Betaseron [Interferon Beta-1b] Other (See Comments)    Increased LFT's  . Topamax [Topiramate]     Cognitive slowing  . Doxycycline Other (See Comments)    Thrush,dizziness   Past Surgical History:  Procedure Laterality Date  . CATARACT SURGERY  12/10  . CESAREAN SECTION    . CORONARY ANGIOPLASTY WITH STENT PLACEMENT    . TONSILLECTOMY    . VESICOVAGINAL FISTULA CLOSURE W/ TAH     Family History  Problem Relation Age of Onset  . Hypertension Mother   . Heart failure Father   . Aneurysm Brother     THORACIC/ABD ANEURYSM  . Prostate cancer Brother 68  . Fibromyalgia Sister   . Brain cancer Maternal Grandmother 4  . Multiple sclerosis Neg Hx    Social History   Social History  . Marital status: Married    Spouse name: N/A  . Number of children: 2  . Years of education: 14   Occupational History  . [employer]    Social History Main Topics  . Smoking status: Former Smoker    Packs/day: 1.00    Quit date: 12/16/2015  . Smokeless tobacco: Never Used  . Alcohol use 0.0 oz/week     Comment: OCCASIONAL ALCOHOL USE.  . Drug use: No  . Sexual activity: Yes   Other Topics Concern  . Not on file   Social History  Narrative   The patient lives with husband. 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    Patient drinks occasionally, does not chew tobacco, does not use recreational drugs. She does drink occasional caffeine. She does wear her seatbelt. She does wear bike helmet riding a bike. She does exercise 3 times a week. She does not wear hearing aids or dentures. There is a smoke alarm in her home. There are no firearms in her home. She feels safe in her relationship. She has never experienced physical abuse.   Patient sleeps 7-8 hours a  night.   Patient drinks 2 cups of caffeine daily.   Patient is right handed.     Medication List       Accurate as of 06/04/16  8:56 AM. Always use your most recent med list.          acetaminophen 325 MG tablet Commonly known as:  TYLENOL Take 2 tablets (650 mg total) by mouth every 6 (six) hours as needed for fever, headache or moderate pain.   amitriptyline 50 MG tablet Commonly known as:  ELAVIL Take 1 tablet (50 mg total) by mouth daily.   amLODipine 10 MG tablet Commonly known as:  NORVASC Take 1 tablet (10 mg total) by mouth daily.   aspirin EC 325 MG tablet Take 325 mg by mouth daily.   carvedilol 6.25 MG tablet Commonly known as:  COREG Take 1 tablet (6.25 mg total) by mouth 2 (two) times daily with a meal.   cholecalciferol 1000 units tablet Commonly known as:  VITAMIN D Take 1,000 Units by mouth daily.   CINNAMON PO Take 2,000 mg by mouth daily.   esomeprazole 20 MG capsule Commonly known as:  NEXIUM Take 20 mg by mouth daily at 12 noon.   fenofibrate 160 MG tablet Take 1 tablet (160 mg total) by mouth daily.   fish oil-omega-3 fatty acids 1000 MG capsule Take 1 g by mouth daily.   gabapentin 600 MG tablet Commonly known as:  NEURONTIN Take 1 tablet (600 mg total) by mouth 2 (two) times daily.   glimepiride 2 MG tablet Commonly known as:  AMARYL Take 1 tablet (2 mg total) by mouth 2 (two) times daily.   glucose blood test strip DX:E11.9, monitor fasting BG daily.   Iron 325 (65 Fe) MG Tabs Take 325 mg by mouth daily.   levothyroxine 100 MCG tablet Commonly known as:  SYNTHROID, LEVOTHROID Take 1 tablet (100 mcg total) by mouth daily.   losartan-hydrochlorothiazide 100-25 MG tablet Commonly known as:  HYZAAR Take 1 tablet by mouth daily.   metFORMIN 1000 MG tablet Commonly known as:  GLUCOPHAGE Take 1 tablet (1,000 mg total) by mouth 2 (two) times daily with a meal.   MULTIVITAMINS PO Take 1 tablet by mouth at bedtime.     nitroGLYCERIN 0.4 MG SL tablet Commonly known as:  NITROSTAT Place 1 tablet (0.4 mg total) under the tongue every 5 (five) minutes as needed for chest pain.   Oxycodone HCl 10 MG Tabs Take 1 tablet (10 mg total) by mouth every 8 (eight) hours as needed.   prasugrel 10 MG Tabs tablet Commonly known as:  EFFIENT Take 1 tablet (10 mg total) by mouth daily.   rosuvastatin 10 MG tablet Commonly known as:  CRESTOR Take 1 tablet (10 mg total) by mouth daily.   TECFIDERA 240 MG Cpdr Generic drug:  Dimethyl Fumarate TAKE 1 CAPSULE BY MOUTH TWICE DAILY   traMADol  50 MG tablet Commonly known as:  ULTRAM Take 1 tablet (50 mg total) by mouth every 6 (six) hours as needed.   venlafaxine XR 75 MG 24 hr capsule Commonly known as:  EFFEXOR-XR Take 75 mg by mouth daily.        Recent Results (from the past 2160 hour(s))  CBC with Differential/Platelet     Status: Abnormal   Collection Time: 03/10/16 10:19 AM  Result Value Ref Range   WBC 8.6 3.4 - 10.8 x10E3/uL   RBC 4.02 3.77 - 5.28 x10E6/uL   Hemoglobin 12.8 11.1 - 15.9 g/dL   Hematocrit 39.1 34.0 - 46.6 %   MCV 97 79 - 97 fL   MCH 31.8 26.6 - 33.0 pg   MCHC 32.7 31.5 - 35.7 g/dL   RDW 13.2 12.3 - 15.4 %   Platelets 216 150 - 379 x10E3/uL   Neutrophils 85 %   Lymphs 7 %   Monocytes 6 %   Eos 2 %   Basos 0 %   Neutrophils Absolute 7.2 (H) 1.4 - 7.0 x10E3/uL   Lymphocytes Absolute 0.6 (L) 0.7 - 3.1 x10E3/uL   Monocytes Absolute 0.5 0.1 - 0.9 x10E3/uL   EOS (ABSOLUTE) 0.2 0.0 - 0.4 x10E3/uL   Basophils Absolute 0.0 0.0 - 0.2 x10E3/uL   Immature Granulocytes 0 %   Immature Grans (Abs) 0.0 0.0 - 0.1 x10E3/uL  Comprehensive metabolic panel     Status: Abnormal   Collection Time: 03/10/16 10:19 AM  Result Value Ref Range   Glucose 239 (H) 65 - 99 mg/dL   BUN 15 8 - 27 mg/dL   Creatinine, Ser 0.86 0.57 - 1.00 mg/dL   GFR calc non Af Amer 73 >59 mL/min/1.73   GFR calc Af Amer 84 >59 mL/min/1.73   BUN/Creatinine Ratio 17 12 -  28   Sodium 139 134 - 144 mmol/L   Potassium 4.3 3.5 - 5.2 mmol/L   Chloride 96 96 - 106 mmol/L   CO2 28 18 - 29 mmol/L   Calcium 10.2 8.7 - 10.3 mg/dL   Total Protein 7.1 6.0 - 8.5 g/dL   Albumin 4.4 3.6 - 4.8 g/dL   Globulin, Total 2.7 1.5 - 4.5 g/dL   Albumin/Globulin Ratio 1.6 1.2 - 2.2   Bilirubin Total <0.2 0.0 - 1.2 mg/dL   Alkaline Phosphatase 63 39 - 117 IU/L   AST 17 0 - 40 IU/L   ALT 18 0 - 32 IU/L  POCT glycosylated hemoglobin (Hb A1C)     Status: Abnormal   Collection Time: 05/13/16  8:14 AM  Result Value Ref Range   Hemoglobin A1C 6.5   Basic metabolic panel     Status: Abnormal   Collection Time: 05/18/16  1:05 PM  Result Value Ref Range   Sodium 133 (L) 135 - 145 mmol/L   Potassium 3.1 (L) 3.5 - 5.1 mmol/L   Chloride 96 (L) 101 - 111 mmol/L   CO2 27 22 - 32 mmol/L   Glucose, Bld 113 (H) 65 - 99 mg/dL   BUN 8 6 - 20 mg/dL   Creatinine, Ser 0.68 0.44 - 1.00 mg/dL   Calcium 9.7 8.9 - 10.3 mg/dL   GFR calc non Af Amer >60 >60 mL/min   GFR calc Af Amer >60 >60 mL/min    Comment: (NOTE) The eGFR has been calculated using the CKD EPI equation. This calculation has not been validated in all clinical situations. eGFR's persistently <60 mL/min signify possible Chronic Kidney Disease.  Anion gap 10 5 - 15  CBC with Differential     Status: Abnormal   Collection Time: 05/18/16  1:05 PM  Result Value Ref Range   WBC 13.1 (H) 4.0 - 10.5 K/uL   RBC 3.89 3.87 - 5.11 MIL/uL   Hemoglobin 12.8 12.0 - 15.0 g/dL   HCT 36.2 36.0 - 46.0 %   MCV 93.1 78.0 - 100.0 fL   MCH 32.9 26.0 - 34.0 pg   MCHC 35.4 30.0 - 36.0 g/dL   RDW 12.9 11.5 - 15.5 %   Platelets 201 150 - 400 K/uL   Neutrophils Relative % 85 %   Neutro Abs 11.2 (H) 1.7 - 7.7 K/uL   Lymphocytes Relative 5 %   Lymphs Abs 0.6 (L) 0.7 - 4.0 K/uL   Monocytes Relative 8 %   Monocytes Absolute 1.0 0.1 - 1.0 K/uL   Eosinophils Relative 2 %   Eosinophils Absolute 0.2 0.0 - 0.7 K/uL   Basophils Relative 0 %    Basophils Absolute 0.0 0.0 - 0.1 K/uL  Protime-INR     Status: None   Collection Time: 05/18/16  1:05 PM  Result Value Ref Range   Prothrombin Time 12.7 11.4 - 15.2 seconds   INR 0.95   Magnesium     Status: Abnormal   Collection Time: 05/19/16  4:13 AM  Result Value Ref Range   Magnesium 1.5 (L) 1.7 - 2.4 mg/dL  Basic metabolic panel     Status: Abnormal   Collection Time: 05/19/16  4:13 AM  Result Value Ref Range   Sodium 136 135 - 145 mmol/L   Potassium 3.2 (L) 3.5 - 5.1 mmol/L   Chloride 99 (L) 101 - 111 mmol/L   CO2 27 22 - 32 mmol/L   Glucose, Bld 145 (H) 65 - 99 mg/dL   BUN 5 (L) 6 - 20 mg/dL   Creatinine, Ser 0.74 0.44 - 1.00 mg/dL   Calcium 9.3 8.9 - 10.3 mg/dL   GFR calc non Af Amer >60 >60 mL/min   GFR calc Af Amer >60 >60 mL/min    Comment: (NOTE) The eGFR has been calculated using the CKD EPI equation. This calculation has not been validated in all clinical situations. eGFR's persistently <60 mL/min signify possible Chronic Kidney Disease.    Anion gap 10 5 - 15  CBC     Status: Abnormal   Collection Time: 05/19/16  4:13 AM  Result Value Ref Range   WBC 9.1 4.0 - 10.5 K/uL   RBC 3.86 (L) 3.87 - 5.11 MIL/uL   Hemoglobin 12.3 12.0 - 15.0 g/dL   HCT 36.0 36.0 - 46.0 %   MCV 93.3 78.0 - 100.0 fL   MCH 31.9 26.0 - 34.0 pg   MCHC 34.2 30.0 - 36.0 g/dL   RDW 13.4 11.5 - 15.5 %   Platelets 195 150 - 400 K/uL  Glucose, capillary     Status: Abnormal   Collection Time: 05/19/16  6:15 AM  Result Value Ref Range   Glucose-Capillary 151 (H) 65 - 99 mg/dL   Comment 1 Notify RN    Comment 2 Document in Chart   Glucose, capillary     Status: Abnormal   Collection Time: 05/19/16 12:17 PM  Result Value Ref Range   Glucose-Capillary 132 (H) 65 - 99 mg/dL   Comment 1 Notify RN    Comment 2 Document in Chart   Glucose, capillary     Status: Abnormal   Collection Time: 05/19/16  5:18 PM  Result Value Ref Range   Glucose-Capillary 137 (H) 65 - 99 mg/dL   Comment 1  Notify RN    Comment 2 Document in Chart   Glucose, capillary     Status: Abnormal   Collection Time: 05/19/16  9:02 PM  Result Value Ref Range   Glucose-Capillary 213 (H) 65 - 99 mg/dL  Glucose, capillary     Status: Abnormal   Collection Time: 05/19/16  9:08 PM  Result Value Ref Range   Glucose-Capillary 180 (H) 65 - 99 mg/dL  Basic metabolic panel     Status: Abnormal   Collection Time: 05/20/16  4:48 AM  Result Value Ref Range   Sodium 137 135 - 145 mmol/L   Potassium 3.8 3.5 - 5.1 mmol/L   Chloride 101 101 - 111 mmol/L   CO2 28 22 - 32 mmol/L   Glucose, Bld 150 (H) 65 - 99 mg/dL   BUN 7 6 - 20 mg/dL   Creatinine, Ser 0.69 0.44 - 1.00 mg/dL   Calcium 9.3 8.9 - 10.3 mg/dL   GFR calc non Af Amer >60 >60 mL/min   GFR calc Af Amer >60 >60 mL/min    Comment: (NOTE) The eGFR has been calculated using the CKD EPI equation. This calculation has not been validated in all clinical situations. eGFR's persistently <60 mL/min signify possible Chronic Kidney Disease.    Anion gap 8 5 - 15  Glucose, capillary     Status: Abnormal   Collection Time: 05/20/16  6:16 AM  Result Value Ref Range   Glucose-Capillary 137 (H) 65 - 99 mg/dL   Comment 1 Notify RN    Comment 2 Document in Chart   Glucose, capillary     Status: Abnormal   Collection Time: 05/20/16 11:47 AM  Result Value Ref Range   Glucose-Capillary 192 (H) 65 - 99 mg/dL  Glucose, capillary     Status: Abnormal   Collection Time: 05/20/16  4:31 PM  Result Value Ref Range   Glucose-Capillary 228 (H) 65 - 99 mg/dL    Ct Head Wo Contrast  Result Date: 05/19/2016 CLINICAL DATA:  Follow-up subdural hematoma. Fall 2 days ago. Headache. EXAM: CT HEAD WITHOUT CONTRAST TECHNIQUE: Contiguous axial images were obtained from the base of the skull through the vertex without intravenous contrast. COMPARISON:  05/18/2016 FINDINGS: Brain: Small acute subdural hematoma over the left cerebral convexity in the frontal, parietal, and temporal  regions is unchanged. Subdural hematoma extending along the left aspect of the falx is also unchanged and measures up to 8 mm in thickness. Rightward midline shift measures 4 mm, not significantly changed. There is no evidence of acute cortical infarct, mass, or hydrocephalus. Vascular: Atherosclerotic calcification involving the carotid siphons. Skull: No skull fracture or focal osseous lesion identified. Sinuses/Orbits: Scattered, mild paranasal sinus mucosal thickening. Clear mastoid air cells. Prior bilateral cataract extraction. Other: Unchanged posterior scalp soft tissue swelling. IMPRESSION: Unchanged left-sided subdural hemorrhage and mild rightward midline shift. Electronically Signed   By: Logan Bores M.D.   On: 05/19/2016 12:37   Ct Head Wo Contrast  Addendum Date: 05/18/2016   ADDENDUM REPORT: 05/18/2016 12:23 ADDENDUM: Critical Value/emergent results were called by telephone at the time of interpretation on 05/18/2016 at 12:23 pm to Dr. Howard Pouch , who verbally acknowledged these results. Electronically Signed   By: Franchot Gallo M.D.   On: 05/18/2016 12:23   Result Date: 05/18/2016 CLINICAL DATA:  [place] yesterday. Head injury. On blood thinner. Headache. EXAM:  CT HEAD WITHOUT CONTRAST TECHNIQUE: Contiguous axial images were obtained from the base of the skull through the vertex without intravenous contrast. COMPARISON:  MRI head 04/11/2016 FINDINGS: Brain: Acute left subdural hematomas are present. 7 mm thick interhemispheric subdural hematoma anteriorly on the left. Left hemispheric subdural hematoma involving the left temporal lobe, and left frontal and parietal lobe. Much of this is over the convexity as well as some in the temporal lobe. This measures up to 7 mm in thickness in the left frontal convexity and approximately 3 mm in the temporal lobe. 3 mm midline shift towards the right due to mass-effect. Negative for hydrocephalus. No acute infarct or mass. Vascular: No hyperdense  vessel or unexpected calcification. Skull: Negative for fracture. Sinuses/Orbits: Mild mucosal thickening left lateral sphenoid sinus. Remaining sinuses clear. Other: None IMPRESSION: Acute subdural hematoma on the left. This involves the interhemispheric fissure as well as the left hemisphere most notably over the convexity. This measures up to 7 mm in thickness. 3 mm midline shift to the right. Electronically Signed: By: Franchot Gallo M.D. On: 05/18/2016 12:19     ROS: 14 pt review of systems performed and negative (unless mentioned in an HPI)  Objective: BP 109/71 (BP Location: Left Arm, Patient Position: Sitting, Cuff Size: Normal)   Pulse 66   Temp 98.1 F (36.7 C)   Resp 20   Ht '5\' 6"'$  (1.676 m)   Wt 190 lb 4 oz (86.3 kg)   SpO2 97%   BMI 30.71 kg/m  Gen: Afebrile. No acute distress. Nontoxic in appearance, well-developed, well-nourished, pleasnat caucasian female.  HENT: AT. [place]. Bilateral TM visualized and normal in appearance, normal external auditory canal. MMM, no oral lesions, adequate dentition. Bilateral nares within normal limitswnl. Throat without erythema, ulcerations or exudates. no Cough on exam, no hoarseness on exam. Eyes:Pupils Equal Round Reactive to light, Extraocular movements intact,  Conjunctiva without redness, discharge or icterus. Neck/lymp/endocrine: Supple,no lymphadenopathy, no thyromegaly CV: RRR, no edema, +2/4 P posterior tibialis pulses. no carotid bruits. No JVD. Chest: CTAB, no wheeze, rhonchi or crackles. Normal  Respiratory effort. good Air movement. Abd: Soft. obese. NTND. BS present. no Masses palpated. No hepatosplenomegaly. No rebound tenderness or guarding. Skin: no rashes, purpura or petechiae. Warm and well-perfused. Skin intact. Neuro/Msk: unsteady gait. PERLA. EOMi. Alert. Oriented x3.Muscle strength 5/5 upper/lower extremity. DTRs equal bilaterally. Psych: flattened affect today. Appears very tired. Slower speech. Normal thought content and  judgment.  Assessment/plan: Daisy Bennett is a 60 y.o. female present for CPE  Without PAP.  Encounter for preventive health examination Patient was encouraged to exercise greater than 150 minutes a week. Patient was encouraged to choose a diet filled with fresh fruits and vegetables, and lean meats. AVS provided to patient today for education/recommendation on gender specific health and safety maintenance. Colonoscopy: completed 01/2011, by [facility]. follow up 5 years due in 2017 [facility]ammogram: [facility]  Cervical cancer screening: last pap: UTD with GYN Immunizations: tdap 2015, Influenza 2017 (encouraged yearly), PNA series prevnar completed PSV23 due 09/2016, , zostavax completed Infectious disease screening: HIV and  Hep C completed DEXA: [facility] and normal 2017.  Thyroid disorder screen - Lipid panel - TSH Screening cholesterol level - Lipid panel - TSH BMI 30.0-30.9,adult - Lipid panel - TSH History of colon polyps/Colon cancer screening - Ambulatory referral to Gastroenterology, 5 year recommneded   Return in about 1 year (around 06/04/2017) for CPE.  Electronically signed by: Joseph Art  Ra[facility] [place]

## 2016-06-04 NOTE — Telephone Encounter (Signed)
Please call pt: - her labs are normal.  - I hope she is feeling better

## 2016-06-04 NOTE — Addendum Note (Signed)
Addended by: Ralph Dowdy on: 06/04/2016 09:17 AM   Modules accepted: Orders

## 2016-06-04 NOTE — Patient Instructions (Signed)
It was nice to see you today.  I want you to make certain to make an appt with your neurologist so they can evaluate you.   Health Maintenance, Female Adopting a healthy lifestyle and getting preventive care can go a long way to promote health and wellness. Talk with your health care provider about what schedule of regular examinations is right for you. This is a good chance for you to check in with your provider about disease prevention and staying healthy. In between checkups, there are plenty of things you can do on your own. Experts have done a lot of research about which lifestyle changes and preventive measures are most likely to keep you healthy. Ask your health care provider for more information. WEIGHT AND DIET  Eat a healthy diet  Be sure to include plenty of vegetables, fruits, low-fat dairy products, and lean protein.  Do not eat a lot of foods high in solid fats, added sugars, or salt.  Get regular exercise. This is one of the most important things you can do for your health.  Most adults should exercise for at least 150 minutes each week. The exercise should increase your heart rate and make you sweat (moderate-intensity exercise).  Most adults should also do strengthening exercises at least twice a week. This is in addition to the moderate-intensity exercise.  Maintain a healthy weight  Body mass index (BMI) is a measurement that can be used to identify possible weight problems. It estimates body fat based on height and weight. Your health care provider can help determine your BMI and help you achieve or maintain a healthy weight.  For females 70 years of age and older:   A BMI below 18.5 is considered underweight.  A BMI of 18.5 to 24.9 is normal.  A BMI of 25 to 29.9 is considered overweight.  A BMI of 30 and above is considered obese.  Watch levels of cholesterol and blood lipids  You should start having your blood tested for lipids and cholesterol at 62 years of  age, then have this test every 5 years.  You may need to have your cholesterol levels checked more often if:  Your lipid or cholesterol levels are high.  You are older than 62 years of age.  You are at high risk for heart disease.  CANCER SCREENING   Lung Cancer  Lung cancer screening is recommended for adults 50-36 years old who are at high risk for lung cancer because of a history of smoking.  A yearly low-dose CT scan of the lungs is recommended for people who:  Currently smoke.  Have quit within the past 15 years.  Have at least a 30-pack-year history of smoking. A pack year is smoking an average of one pack of cigarettes a day for 1 year.  Yearly screening should continue until it has been 15 years since you quit.  Yearly screening should stop if you develop a health problem that would prevent you from having lung cancer treatment.  Breast Cancer  Practice breast self-awareness. This means understanding how your breasts normally appear and feel.  It also means doing regular breast self-exams. Let your health care provider know about any changes, no matter how small.  If you are in your 20s or 30s, you should have a clinical breast exam (CBE) by a health care provider every 1-3 years as part of a regular health exam.  If you are 8 or older, have a CBE every year. Also consider having  a breast X-ray (mammogram) every year.  If you have a family history of breast cancer, talk to your health care provider about genetic screening.  If you are at high risk for breast cancer, talk to your health care provider about having an MRI and a mammogram every year.  Breast cancer gene (BRCA) assessment is recommended for women who have family members with BRCA-related cancers. BRCA-related cancers include:  Breast.  Ovarian.  Tubal.  Peritoneal cancers.  Results of the assessment will determine the need for genetic counseling and BRCA1 and BRCA2 testing. Cervical  Cancer Your health care provider may recommend that you be screened regularly for cancer of the pelvic organs (ovaries, uterus, and vagina). This screening involves a pelvic examination, including checking for microscopic changes to the surface of your cervix (Pap test). You may be encouraged to have this screening done every 3 years, beginning at age 31.  For women ages 72-65, health care providers may recommend pelvic exams and Pap testing every 3 years, or they may recommend the Pap and pelvic exam, combined with testing for human papilloma virus (HPV), every 5 years. Some types of HPV increase your risk of cervical cancer. Testing for HPV may also be done on women of any age with unclear Pap test results.  Other health care providers may not recommend any screening for nonpregnant women who are considered low risk for pelvic cancer and who do not have symptoms. Ask your health care provider if a screening pelvic exam is right for you.  If you have had past treatment for cervical cancer or a condition that could lead to cancer, you need Pap tests and screening for cancer for at least 20 years after your treatment. If Pap tests have been discontinued, your risk factors (such as having a new sexual partner) need to be reassessed to determine if screening should resume. Some women have medical problems that increase the chance of getting cervical cancer. In these cases, your health care provider may recommend more frequent screening and Pap tests. Colorectal Cancer  This type of cancer can be detected and often prevented.  Routine colorectal cancer screening usually begins at 62 years of age and continues through 62 years of age.  Your health care provider may recommend screening at an earlier age if you have risk factors for colon cancer.  Your health care provider may also recommend using home test kits to check for hidden blood in the stool.  A small camera at the end of a tube can be used to  examine your colon directly (sigmoidoscopy or colonoscopy). This is done to check for the earliest forms of colorectal cancer.  Routine screening usually begins at age 40.  Direct examination of the colon should be repeated every 5-10 years through 62 years of age. However, you may need to be screened more often if early forms of precancerous polyps or small growths are found. Skin Cancer  Check your skin from head to toe regularly.  Tell your health care provider about any new moles or changes in moles, especially if there is a change in a mole's shape or color.  Also tell your health care provider if you have a mole that is larger than the size of a pencil eraser.  Always use sunscreen. Apply sunscreen liberally and repeatedly throughout the day.  Protect yourself by wearing long sleeves, pants, a wide-brimmed hat, and sunglasses whenever you are outside. HEART DISEASE, DIABETES, AND HIGH BLOOD PRESSURE   High blood pressure  causes heart disease and increases the risk of stroke. High blood pressure is more likely to develop in:  People who have blood pressure in the high end of the normal range (130-139/85-89 mm Hg).  People who are overweight or obese.  People who are African American.  If you are 53-78 years of age, have your blood pressure checked every 3-5 years. If you are 25 years of age or older, have your blood pressure checked every year. You should have your blood pressure measured twice--once when you are at a hospital or clinic, and once when you are not at a hospital or clinic. Record the average of the two measurements. To check your blood pressure when you are not at a hospital or clinic, you can use:  An automated blood pressure machine at a pharmacy.  A home blood pressure monitor.  If you are between 69 years and 63 years old, ask your health care provider if you should take aspirin to prevent strokes.  Have regular diabetes screenings. This involves taking a  blood sample to check your fasting blood sugar level.  If you are at a normal weight and have a low risk for diabetes, have this test once every three years after 62 years of age.  If you are overweight and have a high risk for diabetes, consider being tested at a younger age or more often. PREVENTING INFECTION  Hepatitis B  If you have a higher risk for hepatitis B, you should be screened for this virus. You are considered at high risk for hepatitis B if:  You were born in a country where hepatitis B is common. Ask your health care provider which countries are considered high risk.  Your parents were born in a high-risk country, and you have not been immunized against hepatitis B (hepatitis B vaccine).  You have HIV or AIDS.  You use needles to inject street drugs.  You live with someone who has hepatitis B.  You have had sex with someone who has hepatitis B.  You get hemodialysis treatment.  You take certain medicines for conditions, including cancer, organ transplantation, and autoimmune conditions. Hepatitis C  Blood testing is recommended for:  Everyone born from [birth year] through 1967.  Anyone with known risk factors for hepatitis C. Sexually transmitted infections (STIs)  You should be screened for sexually transmitted infections (STIs) including gonorrhea and chlamydia if:  You are sexually active and are younger than 62 years of age.  You are older than 62 years of age and your health care provider tells you that you are at risk for this type of infection.  Your sexual activity has changed since you were last screened and you are at an increased risk for chlamydia or gonorrhea. Ask your health care provider if you are at risk.  If you do not have HIV, but are at risk, it may be recommended that you take a prescription medicine daily to prevent HIV infection. This is called pre-exposure prophylaxis (PrEP). You are considered at risk if:  You are sexually active and do  not regularly use condoms or know the HIV status of your partner(s).  You take drugs by injection.  You are sexually active with a partner who has HIV. Talk with your health care provider about whether you are at high risk of being infected with HIV. If you choose to begin PrEP, you should first be tested for HIV. You should then be tested every 3 months for as long as you  are taking PrEP.  PREGNANCY   If you are premenopausal and you may become pregnant, ask your health care provider about preconception counseling.  If you may become pregnant, take 400 to 800 micrograms (mcg) of folic acid every day.  If you want to prevent pregnancy, talk to your health care provider about birth control (contraception). OSTEOPOROSIS AND MENOPAUSE   Osteoporosis is a disease in which the bones lose minerals and strength with aging. This can result in serious bone fractures. Your risk for osteoporosis can be identified using a bone density scan.  If you are 71 years of age or older, or if you are at risk for osteoporosis and fractures, ask your health care provider if you should be screened.  Ask your health care provider whether you should take a calcium or vitamin D supplement to lower your risk for osteoporosis.  Menopause may have certain physical symptoms and risks.  Hormone replacement therapy may reduce some of these symptoms and risks. Talk to your health care provider about whether hormone replacement therapy is right for you.  HOME CARE INSTRUCTIONS   Schedule regular health, dental, and eye exams.  Stay current with your immunizations.   Do not use any tobacco products including cigarettes, chewing tobacco, or electronic cigarettes.  If you are pregnant, do not drink alcohol.  If you are breastfeeding, limit how much and how often you drink alcohol.  Limit alcohol intake to no more than 1 drink per day for nonpregnant women. One drink equals 12 ounces of beer, 5 ounces of wine, or 1  ounces of hard liquor.  Do not use street drugs.  Do not share needles.  Ask your health care provider for help if you need support or information about quitting drugs.  Tell your health care provider if you often feel depressed.  Tell your health care provider if you have ever been abused or do not feel safe at home.   This information is not intended to replace advice given to you by your health care provider. Make sure you discuss any questions you have with your health care provider.   Document Released: 02/01/2011 Document Revised: 08/09/2014 Document Reviewed: 06/20/2013 Elsevier Interactive Patient Education [employer].

## 2016-06-04 NOTE — Telephone Encounter (Signed)
I called patient. The patient is having some cognitive slowing on the Topamax, she will discontinue the medication. She only got up to taking one tablet at night.  We will recheck a CT scan of the head to ensure that the subdural hematoma has not enlarged.  If the patient does not improve with her cognitive capacity off of the Topamax, we may need to keep her out of work longer than November 6.

## 2016-06-06 ENCOUNTER — Encounter: Payer: Self-pay | Admitting: Neurology

## 2016-06-07 ENCOUNTER — Encounter: Payer: Self-pay | Admitting: Neurology

## 2016-06-07 NOTE — Progress Notes (Signed)
Letter printed, signed and placed up front for pt pick-up.

## 2016-06-08 ENCOUNTER — Encounter: Payer: Self-pay | Admitting: Neurology

## 2016-06-15 ENCOUNTER — Encounter: Payer: Self-pay | Admitting: Neurology

## 2016-06-17 ENCOUNTER — Other Ambulatory Visit: Payer: Self-pay | Admitting: Neurology

## 2016-06-17 NOTE — Telephone Encounter (Signed)
I called [facility] and they are not allowing me to proceed with getting the CT Head wo contrast approved because they said she already had one on file for 05/18/16 through another doctor and they said they cannot have the same study approved like that.

## 2016-06-17 NOTE — Telephone Encounter (Signed)
I called [facility] back and they inform me that you can do a peer to peer.. The Case number is [id number] and select option 3. The phone number is [phone]

## 2016-06-17 NOTE — Telephone Encounter (Signed)
The purpose of this CT scan is to follow-up for the subdural hematoma to ensure that it is not enlarging. If I need to do a peer to peer please let me know.

## 2016-06-18 ENCOUNTER — Other Ambulatory Visit: Payer: Self-pay | Admitting: Family Medicine

## 2016-06-18 ENCOUNTER — Other Ambulatory Visit: Payer: Self-pay | Admitting: Cardiovascular Disease

## 2016-06-18 ENCOUNTER — Other Ambulatory Visit: Payer: Self-pay | Admitting: Neurology

## 2016-06-18 NOTE — Telephone Encounter (Signed)
I have called for peer to peer review.  CT the head has been approved, approval number is   [id number], expires 08/02/2016.

## 2016-06-18 NOTE — Telephone Encounter (Signed)
Noted, thank you for your help!  °

## 2016-06-21 ENCOUNTER — Other Ambulatory Visit: Payer: Self-pay | Admitting: Cardiovascular Disease

## 2016-06-21 MED ORDER — GABAPENTIN 600 MG PO TABS: 600.0000 mg | ORAL_TABLET | Freq: Two times a day (BID) | ORAL | 3 refills | Status: DC

## 2016-06-21 NOTE — Telephone Encounter (Signed)
Approved    Disp Refills Start End  amLODipine (NORVASC) 10 MG tablet 90 tablet 2 06/21/2016   Sig:  TAKE 1 TABLET BY MOUTH DAILY  Class:  Normal  DAW:  No  Authorizing Provider:  Josue Hector, MD  Ordering User:  Derl Barrow  carvedilol (COREG) 6.25 MG tablet 180 tablet 2 06/21/2016   Sig:  TAKE 1 TABLET BY MOUTH TWICE DAILY WITH MEALS  Class:  Normal  DAW:  No  Authorizing Provider:  Josue Hector, MD  Ordering User:  [place], [place]

## 2016-06-23 ENCOUNTER — Encounter: Payer: Self-pay | Admitting: Neurology

## 2016-06-29 ENCOUNTER — Encounter: Payer: Self-pay | Admitting: Family Medicine

## 2016-07-01 ENCOUNTER — Ambulatory Visit
Admission: RE | Admit: 2016-07-01 | Discharge: 2016-07-01 | Disposition: A | Discharge status: Home / Self Care | Payer: 59 | Source: Ambulatory Visit | Attending: Neurology | Admitting: Neurology

## 2016-07-01 DIAGNOSIS — I6200 Nontraumatic subdural hemorrhage, unspecified: Secondary | ICD-10-CM | POA: Diagnosis not present

## 2016-07-02 ENCOUNTER — Encounter: Payer: Self-pay | Admitting: Neurology

## 2016-07-02 ENCOUNTER — Telehealth: Payer: Self-pay | Admitting: Neurology

## 2016-07-02 NOTE — Telephone Encounter (Signed)
   I called patient. The CT the head shows good resolution of the subdural hematoma, the patient herself feels "back to normal". I discussed the results with the patient.  CT head 07/01/16:  IMPRESSION:  This CT scan of the head without contrast shows the following: 1.    Small 4 mm left frontal convexity hygroma at the site of the prior acute subdural hematoma noted on the 05/18/2016 CT scan.    This measured 7 mm on the prior CT scan. 2.    Resolution of the left temporal and interhemispheric subdural hematomas noted on the prior CT scan

## 2016-07-19 ENCOUNTER — Other Ambulatory Visit: Payer: Self-pay | Admitting: *Deleted

## 2016-07-19 ENCOUNTER — Encounter: Payer: Self-pay | Admitting: Cardiovascular Disease

## 2016-07-19 MED ORDER — ROSUVASTATIN CALCIUM 10 MG PO TABS: 10.0000 mg | ORAL_TABLET | Freq: Every day | ORAL | 2 refills | Status: DC

## 2016-08-03 ENCOUNTER — Encounter: Payer: Self-pay | Admitting: Cardiovascular Disease

## 2016-08-03 ENCOUNTER — Encounter: Payer: Self-pay | Admitting: Neurology

## 2016-08-04 ENCOUNTER — Other Ambulatory Visit: Payer: Self-pay | Admitting: *Deleted

## 2016-08-04 DIAGNOSIS — I25119 Atherosclerotic heart disease of native coronary artery with unspecified angina pectoris: Secondary | ICD-10-CM

## 2016-08-04 MED ORDER — DIMETHYL FUMARATE 240 MG PO CPDR: 1.0000 | DELAYED_RELEASE_CAPSULE | Freq: Two times a day (BID) | ORAL | 11 refills | Status: DC

## 2016-08-04 MED ORDER — AMLODIPINE BESYLATE 10 MG PO TABS: 10.0000 mg | ORAL_TABLET | Freq: Every day | ORAL | 0 refills | Status: DC

## 2016-08-04 MED ORDER — LOSARTAN POTASSIUM-HCTZ 100-25 MG PO TABS: 1.0000 | ORAL_TABLET | Freq: Every day | ORAL | 0 refills | Status: DC

## 2016-08-04 MED ORDER — AMITRIPTYLINE HCL 50 MG PO TABS: 50.0000 mg | ORAL_TABLET | Freq: Every day | ORAL | 3 refills | Status: DC

## 2016-08-04 MED ORDER — NITROGLYCERIN 0.4 MG SL SUBL: 0.4000 mg | SUBLINGUAL_TABLET | SUBLINGUAL | 4 refills | Status: DC | PRN

## 2016-08-04 MED ORDER — CARVEDILOL 6.25 MG PO TABS: 6.2500 mg | ORAL_TABLET | Freq: Two times a day (BID) | ORAL | 0 refills | Status: DC

## 2016-08-04 MED ORDER — PRASUGREL HCL 10 MG PO TABS: 10.0000 mg | ORAL_TABLET | Freq: Every day | ORAL | 0 refills | Status: DC

## 2016-08-04 MED ORDER — GABAPENTIN 600 MG PO TABS: 600.0000 mg | ORAL_TABLET | Freq: Two times a day (BID) | ORAL | 3 refills | Status: DC

## 2016-08-17 ENCOUNTER — Ambulatory Visit (INDEPENDENT_AMBULATORY_CARE_PROVIDER_SITE_OTHER): Payer: 59 | Admitting: Family Medicine

## 2016-08-17 ENCOUNTER — Encounter: Payer: Self-pay | Admitting: Family Medicine

## 2016-08-17 VITALS — BP 132/80 | HR 73 | Temp 98.3°F | Resp 20 | Ht 66.0 in | Wt 198.0 lb

## 2016-08-17 DIAGNOSIS — E1121 Type 2 diabetes mellitus with diabetic nephropathy: Secondary | ICD-10-CM | POA: Diagnosis not present

## 2016-08-17 DIAGNOSIS — E782 Mixed hyperlipidemia: Secondary | ICD-10-CM

## 2016-08-17 DIAGNOSIS — I10 Essential (primary) hypertension: Secondary | ICD-10-CM | POA: Diagnosis not present

## 2016-08-17 DIAGNOSIS — E119 Type 2 diabetes mellitus without complications: Secondary | ICD-10-CM

## 2016-08-17 DIAGNOSIS — E039 Hypothyroidism, unspecified: Secondary | ICD-10-CM | POA: Diagnosis not present

## 2016-08-17 DIAGNOSIS — Z6830 Body mass index (BMI) 30.0-30.9, adult: Secondary | ICD-10-CM

## 2016-08-17 LAB — POCT GLYCOSYLATED HEMOGLOBIN (HGB A1C): Hemoglobin A1C: 7.2

## 2016-08-17 MED ORDER — LEVOTHYROXINE SODIUM 100 MCG PO TABS: 100.0000 ug | ORAL_TABLET | Freq: Every day | ORAL | 3 refills | Status: DC

## 2016-08-17 MED ORDER — FENOFIBRATE 160 MG PO TABS: 160.0000 mg | ORAL_TABLET | Freq: Every day | ORAL | 3 refills | Status: DC

## 2016-08-17 MED ORDER — GLIMEPIRIDE 2 MG PO TABS: 2.0000 mg | ORAL_TABLET | Freq: Two times a day (BID) | ORAL | 1 refills | Status: DC

## 2016-08-17 MED ORDER — METFORMIN HCL 1000 MG PO TABS: 1000.0000 mg | ORAL_TABLET | Freq: Two times a day (BID) | ORAL | 5 refills | Status: DC

## 2016-08-17 NOTE — Patient Instructions (Signed)
Refills for medications today will be sent to [facility].   I do not want to change your medicines. Since you are now able to start back to exercising.   Follow up in 3 months.    Please help [place] help you:  It is a privilege to be able to take care of great patients such as yourself. We are honored you have chosen [facility] for your Primary Care home. Below you will find basic instructions that you may need to access in the future. Please help [place] help you by reading the instructions, which cover many of the frequent questions we experience.   Prescription refills and request:  -In order to allow more efficient response time, please call your pharmacy for all refills. They will forward the request electronically to [place]. This allows for the quickest possible response. Request left on a nurse line can take longer to refill, since these are checked as time allows between office patients and other phone calls.  - refill request can take up to 3-5 working days to complete.  - If request is sent electronically and request is appropiate, it is usually completed in 1-2 business days.  - all patients will need to be seen routinely for all chronic medical conditions requiring prescription medications (see follow-up below). If you are overdue for follow up on your condition, you will be asked to make an appointment and we will call in enough medication to cover you until your appointment (up to 30 days).  - all controlled substances will require a face to face visit to request/refill.  - if you desire your prescriptions to go through a new pharmacy, and have an active script at original pharmacy, you will need to call your pharmacy and have scripts transferred to new pharmacy. This is completed between the pharmacy locations and not by your provider.    Results: If any images or labs were ordered, it can take up to 1 week to get results depending on the test ordered and the lab/facility running and resulting  the test. - Normal or stable results, which do not need further discussion, will be released to your mychart immediately with attached note to you. A call will not be generated for normal results. Please make certain to sign up for mychart. If you have questions on how to activate your mychart you can call the front office.  - If your results need further discussion, our office will attempt to contact you via phone, and if unable to reach you after 2 attempts, we will release your abnormal result to your mychart with instructions.  - All results will be automatically released in mychart after 1 week.  - Your provider will provide you with explanation and instruction on all relevant material in your results. Please keep in mind, results and labs may appear confusing or abnormal to the untrained eye, but it does not mean they are actually abnormal for you personally. If you have any questions about your results that are not covered, or you desire more detailed explanation than what was provided, you should make an appointment with your provider to do so.   Our office handles many outgoing and incoming calls daily. If we have not contacted you within 1 week about your results, please check your mychart to see if there is a message first and if not, then contact our office.  In helping with this matter, you help decrease call volume, and therefore allow [place] to be able to  respond to patients needs more efficiently.   Acute office visits (sick visit):  An acute visit is intended for a new problem and are scheduled in shorter time slots to allow schedule openings for patients with new problems. This is the appropriate visit to discuss a new problem. In order to provide you with excellent quality medical care with proper time for you to explain your problem, have an exam and receive treatment with instructions, these appointments should be limited to one new problem per visit. If you experience a new problem, in  which you desire to be addressed, please make an acute office visit, we save openings on the schedule to accommodate you. Please do not save your new problem for any other type of visit, let us take care of it properly and quickly for you.   Follow up visits:  Depending on your condition(s) your provider will need to see you routinely in order to provide you with quality care and prescribe medication(s). Most chronic conditions (Example: hypertension, Diabetes, depression/anxiety... etc), require visits a couple times a year. Your provider will instruct you on proper follow up for your personal medical conditions and history. Please make certain to make follow up appointments for your condition as instructed. Failing to do so could result in lapse in your medication treatment/refills. If you request a refill, and are overdue to be seen on a condition, we will always provide you with a 30 day script (once) to allow you time to schedule.    Medicare wellness (well visit): - we have a wonderful Nurse Maudie Mercury), that will meet with you and provide you will yearly medicare wellness visits. These visits should occur yearly (can not be scheduled less than 1 calendar year apart) and cover preventive health, immunizations, advance directives and screenings you are entitled to yearly through your medicare benefits. Do not miss out on your entitled benefits, this is when medicare will pay for these benefits to be ordered for you.  These are strongly encouraged by your provider and is the appropriate type of visit to make certain you are up to date with all preventive health benefits. If you have not had your medicare wellness exam in the last 12 months, please make certain to schedule one by calling the office and schedule your medicare wellness with Maudie Mercury as soon as possible.   Yearly physical (well visit):  - Adults are recommended to be seen yearly for physicals. Check with your insurance and date of your last physical,  most insurances require one calendar year between physicals. Physicals include all preventive health topics, screenings, medical exam and labs that are appropriate for gender/age and history. You may have fasting labs needed at this visit. This is a well visit (not a sick visit), acute topics should not be covered during this visit.  - Pediatric patients are seen more frequently when they are younger. Your provider will advise you on well child visit timing that is appropriate for your their age. - This is not a medicare wellness visit. Medicare wellness exams do not have an exam portion to the visit. Some medicare companies allow for a physical, some do not allow a yearly physical. If your medicare allows a yearly physical you can schedule the medicare wellness with our nurse Maudie Mercury and have your physical with your provider after, on the same day. Please check with insurance for your full benefits.   Late Policy/No Shows:  - all new patients should arrive 15-30 minutes earlier than appointment to  allow [place] time  to  obtain all personal demographics,  insurance information and for you to complete office paperwork. - All established patients should arrive 10-15 minutes earlier than appointment time to update all information and be checked in .  - In our best efforts to run on time, if you are late for your appointment you will be asked to either reschedule or if able, we will work you back into the schedule. There will be a wait time to work you back in the schedule,  depending on availability.  - If you are unable to make it to your appointment as scheduled, please call 24 hours ahead of time to allow [place] to fill the time slot with someone else who needs to be seen. If you do not cancel your appointment ahead of time, you may be charged a no show fee.

## 2016-08-17 NOTE — Progress Notes (Signed)
Patient ID: Daisy Bennett, female   DOB: 12/18/1955, 61 y.o.   MRN: [id number]   Subjective:    Patient ID: Daisy Bennett, female    DOB: 1955-12-16, 61 y.o.   MRN: [id number]   Patient Care Team    Relationship Specialty Notifications Start End  Ma Hillock, DO PCP - General Family Medicine  03/10/15   Devra Dopp, MD Referring Physician Dermatology  02/10/16   Kathrynn Ducking, MD Consulting Physician Neurology  05/18/16   Linda Hedges, DO Consulting Physician Obstetrics and Gynecology  06/04/16   D Orion Modest, OD  Optometry  08/17/16      Diabetes   Diabetes/BMI30: Patient presents for routine scheduled diabetes follow-up. Last A1c was 7.4--> 6.5--> 7.2. Pt has not been able to exercise secondary to head injury. She just started exercising again yesterday and she joined a Engineer, civil (consulting).  Amaryl  2 mg daily and metformin 1000 mg BID and she is tolerating them. She is not checking her sugars. She denies hypo/hyperglycemic events. She denies worsening numbness or tingling in her extremities.  She is on gabapentin 600 mg BID. - POCT HgB A1C-->  6.5 --> 7.2 today - PSV 23 given 10/2015, Prevnar due 09/2016 - foot exam 10/14/2015 - Eye exam 09/22/2015; Dr. Venetia Constable - Microalbumin: on an ARB - flu shot UTD 05/12/2016  Hypertension: She reports compliance with her medication regimen of Norvasc 10 mg, Coreg 6.25mg  BID, HCTZ 25 mg QD, Losartan/HCTZ 100-25. H/O CAD, NSTEMI, post stent 2011 (OM)-collateralized RCA.  Pt is on BB, statin (low dose), ACE, fenofibrate, ASA 325. She follows with Dr. Johnsie Cancel, cardiology. Pt denies chest pain, shortness of breath, dyspnea, dizziness. She has noticed mild LE edema.   Hypothyroidism: pt reports compliance with levothyroxine 100 mg Qd.  Immunization History  Administered Date(s) Administered  . Influenza,inj,Quad PF,36+ Mos 04/16/2015, 05/12/2016  . Pneumococcal Polysaccharide-23 10/14/2015  . Tdap 12/26/2013     Past Medical  History:  Diagnosis Date  . Basal cell carcinoma   . Cancer ([place])    basal and squamous cell skin cancer  . DM (diabetes mellitus) ([place])   . Goiter, unspecified   . History of colonic polyps 2012  . Multiple sclerosis ([place])   . Non-ST elevated myocardial infarction (non-STEMI) ([place])   . SCCA (squamous cell carcinoma) of skin    right shin  . SEMI (subendocardial myocardial infarction) ([place]) 09/29/09   TOTAL RCA WITH STENT TO OM BRANCH  . Shingles   . Squamous cell carcinoma   . Tobacco abuse   . Unspecified essential hypertension   . Unspecified hypothyroidism    Allergies  Allergen Reactions  . Betaseron [Interferon Beta-1b] Other (See Comments)    Increased LFT's  . Topamax [Topiramate]     Cognitive slowing  . Doxycycline Other (See Comments)    Thrush,dizziness   Social History   Social History  . Marital status: Married    Spouse name: N/A  . Number of children: 2  . Years of education: 14   Occupational History  . [employer]    Social History Main Topics  . Smoking status: Former Smoker    Packs/day: 1.00    Quit date: 12/16/2015  . Smokeless tobacco: Never Used  . Alcohol use 0.0 oz/week     Comment: OCCASIONAL ALCOHOL USE.  . Drug use: No  . Sexual activity: Yes   Other Topics Concern  . Not on file   Social History Narrative  The patient lives with husband. 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    Patient drinks occasionally, does not chew tobacco, does not use recreational drugs. She does drink occasional caffeine. She does wear her seatbelt. She does wear bike helmet riding a bike. She does exercise 3 times a week. She does not wear hearing aids or dentures. There is a smoke alarm in her home. There are no firearms in her home. She feels safe in her relationship. She has never experienced physical abuse.   Patient sleeps 7-8 hours a night.   Patient drinks 2 cups of caffeine daily.   Patient is right  handed.    Review of Systems Negative, with the exception of above mentioned in HPI     Objective:   Physical Exam BP 132/80 (BP Location: Right Arm, Patient Position: Sitting, Cuff Size: Large)   Pulse 73   Temp 98.3 F (36.8 C)   Resp 20   Ht 5\' 6"  (1.676 m)   Wt 198 lb (89.8 kg)   SpO2 98%   BMI 31.96 kg/m  Gen: Afebrile. No acute distress.  HENT: AT. [place].MMM.  Eyes:Pupils Equal Round Reactive to light, Extraocular movements intact,  Conjunctiva without redness, discharge or icterus. CV: RRR, no edema, +2/4 P posterior tibialis pulses Chest: CTAB, no wheeze or crackles Abd: Soft. NTND. BS present.  Skin: no rashes, purpura or petechiae.  Neuro: Normal gait. PERLA. EOMi. Alert. Oriented.      Assessment & Plan:  Diabetes mellitus without complication (HCC)/BMI 30 - improved with increase dose last visit, responded well.  - POCT HgB A1C:  6.5--> 7.2 today  goal for her is approximately 6.5-7. No changes in meds today, once she is able to get back into her routine of exercising I suspect her DM will improve again.  - continue metformin 1000 mg BID, refills provided.  - continue amaryl 2 mg daily, refills provided. - increase exercise regimen and continue dietary modification.  - PSV 23 given 10/2015, Prevnar due 09/2016 (next DM appt) - foot exam 10/14/2015 - Eye exam 09/22/2015, Dr. Venetia Constable.  - Microalbumin: on an ARB - flu shot UTD 05/12/2016 - f/u 3 months   Hypertension:  - Stable today. - follows with cardiology, Dr. Johnsie Cancel who prescribed medications.  - low salt, increase exercise.  - Continue to monitor at home if above 140/90 will need to see sooner and discuss.   Hypothyroidism, unspecified type - stable. TSH normal 06/2016. - monitor yearly with physical.   Mixed hyperlipidemia - fenofibrate refilled.  - continue statin. Could consider lower dose given last test of LDL of 30.  - monitor yearly with physical (06/2016 last tested)   Electronically  Signed by: Howard Pouch, DO [facility]

## 2016-09-10 ENCOUNTER — Ambulatory Visit: Payer: 59 | Admitting: Neurology

## 2016-09-14 ENCOUNTER — Telehealth: Payer: Self-pay | Admitting: Neurology

## 2016-09-14 NOTE — Telephone Encounter (Signed)
FYI-[facility] is calling to advise [facility] will now be filling patient's Dimethyl Fumarate (TECFIDERA) 240 MG CPDR not [facility] for future refills.

## 2016-09-14 NOTE — Telephone Encounter (Signed)
Noted, placed note in pt chart for future refills.

## 2016-09-27 DIAGNOSIS — E119 Type 2 diabetes mellitus without complications: Secondary | ICD-10-CM | POA: Diagnosis not present

## 2016-10-11 ENCOUNTER — Telehealth: Payer: Self-pay | Admitting: *Deleted

## 2016-10-11 NOTE — Telephone Encounter (Signed)
Called and LVM for pt to call to r/s appt on 10/12/16. Our office is opening at 10am d/t weather. Gave [facility] phone number.

## 2016-10-12 ENCOUNTER — Encounter: Payer: Self-pay | Admitting: Neurology

## 2016-10-12 ENCOUNTER — Ambulatory Visit: Payer: 59 | Admitting: Neurology

## 2016-10-12 ENCOUNTER — Ambulatory Visit (INDEPENDENT_AMBULATORY_CARE_PROVIDER_SITE_OTHER): Payer: 59 | Admitting: Neurology

## 2016-10-12 VITALS — BP 149/75 | HR 69 | Ht 66.0 in | Wt 198.0 lb

## 2016-10-12 DIAGNOSIS — G35 Multiple sclerosis: Secondary | ICD-10-CM | POA: Diagnosis not present

## 2016-10-12 DIAGNOSIS — Z5181 Encounter for therapeutic drug level monitoring: Secondary | ICD-10-CM

## 2016-10-12 DIAGNOSIS — G35D Multiple sclerosis, unspecified: Secondary | ICD-10-CM

## 2016-10-12 NOTE — Progress Notes (Signed)
Reason for visit: Multiple sclerosis  Daisy Bennett is an 61 y.o. female  History of present illness:  Daisy Bennett is a 61 year old right-handed white female with a history of multiple sclerosis. The patient has undergone recent MRI evaluation of the brain in September 2017 that showed mild changes associated with multiple sclerosis. In October 2017 the patient sustained a fall and developed a small subdural hematoma. This resolved spontaneously without surgical intervention. The patient underwent formal neuropsychological evaluation prior to the head injury as she was reporting some problems with memory. This did not show evidence of an organic brain syndrome, it was felt that medication effect and possible mild depression could be an issue. The patient returns to this office for further evaluation. She continues to work, she does report some troubles with memory, but she has taken better notes to help her through the day. The patient reports no new numbness, weakness, balance changes, or troubles with vision or bowel or bladder control. She is on Tecfidera, she is tolerating this drug well.  Past Medical History:  Diagnosis Date  . Basal cell carcinoma   . Cancer ([place])    basal and squamous cell skin cancer  . DM (diabetes mellitus) ([place])   . Goiter, unspecified   . History of colonic polyps 2012  . Multiple sclerosis ([place])   . Non-ST elevated myocardial infarction (non-STEMI) ([place])   . SCCA (squamous cell carcinoma) of skin    right shin  . SEMI (subendocardial myocardial infarction) ([place]) 09/29/09   TOTAL RCA WITH STENT TO OM BRANCH  . Shingles   . Squamous cell carcinoma   . Tobacco abuse   . Unspecified essential hypertension   . Unspecified hypothyroidism     Past Surgical History:  Procedure Laterality Date  . CATARACT SURGERY  12/10  . CESAREAN SECTION    . CORONARY ANGIOPLASTY WITH STENT PLACEMENT    . TONSILLECTOMY    . VESICOVAGINAL FISTULA CLOSURE W/ TAH       Family History  Problem Relation Age of Onset  . Hypertension Mother   . Heart failure Father   . Aneurysm Brother     THORACIC/ABD ANEURYSM  . Prostate cancer Brother 66  . Fibromyalgia Sister   . Brain cancer Maternal Grandmother 3  . Multiple sclerosis Neg Hx     Social history:  reports that she quit smoking about 9 months ago. She smoked 1.00 pack per day. She has never used smokeless tobacco. She reports that she drinks alcohol. She reports that she does not use drugs.    Allergies  Allergen Reactions  . Betaseron [Interferon Beta-1b] Other (See Comments)    Increased LFT's  . Topamax [Topiramate]     Cognitive slowing  . Doxycycline Other (See Comments)    Thrush,dizziness    Medications:  Prior to Admission medications   Medication Sig Start Date End Date Taking? Authorizing Provider  amitriptyline (ELAVIL) 50 MG tablet Take 1 tablet (50 mg total) by mouth daily. 08/04/16  Yes Kathrynn Ducking, MD  amLODipine (NORVASC) 10 MG tablet Take 1 tablet (10 mg total) by mouth daily. 08/04/16  Yes Josue Hector, MD  aspirin EC 325 MG EC tablet Take 325 mg by mouth daily.    Yes Historical Provider, MD  carvedilol (COREG) 6.25 MG tablet Take 1 tablet (6.25 mg total) by mouth 2 (two) times daily with a meal. 08/04/16  Yes Josue Hector, MD  cholecalciferol (VITAMIN D) 1000 units tablet Take  1,000 Units by mouth daily.   Yes Historical Provider, MD  CINNAMON PO Take 2,000 mg by mouth daily.    Yes Historical Provider, MD  Dimethyl Fumarate (TECFIDERA) 240 MG CPDR Take 1 capsule (240 mg total) by mouth 2 (two) times daily. 08/04/16  Yes Kathrynn Ducking, MD  esomeprazole (NEXIUM) 20 MG capsule Take 20 mg by mouth daily at 12 noon.   Yes Historical Provider, MD  fenofibrate 160 MG tablet Take 1 tablet (160 mg total) by mouth daily. 08/17/16  Yes Renee A Kuneff, DO  Ferrous Sulfate (IRON) 325 (65 FE) MG TABS Take 325 mg by mouth daily.    Yes Historical Provider, MD  fish oil-omega-3  fatty acids 1000 MG capsule Take 1 g by mouth daily.    Yes Historical Provider, MD  gabapentin (NEURONTIN) 600 MG tablet Take 1 tablet (600 mg total) by mouth 2 (two) times daily. 08/04/16  Yes Kathrynn Ducking, MD  glimepiride (AMARYL) 2 MG tablet Take 1 tablet (2 mg total) by mouth 2 (two) times daily. 08/17/16  Yes [place], DO  glucose blood test strip DX:E11.9, monitor fasting BG daily. 04/16/15  Yes Renee A Kuneff, DO  hydrochlorothiazide (HYDRODIURIL) 25 MG tablet Take 25 mg by mouth daily. 07/11/16  Yes Historical Provider, MD  levothyroxine (SYNTHROID, LEVOTHROID) 100 MCG tablet Take 1 tablet (100 mcg total) by mouth daily. 08/17/16  Yes Renee A Kuneff, DO  losartan-hydrochlorothiazide (HYZAAR) 100-25 MG tablet Take 1 tablet by mouth daily. 08/04/16  Yes Josue Hector, MD  metFORMIN (GLUCOPHAGE) 1000 MG tablet Take 1 tablet (1,000 mg total) by mouth 2 (two) times daily with a meal. 08/17/16  Yes Renee A Kuneff, DO  Multiple Vitamin (MULTIVITAMINS PO) Take 1 tablet by mouth at bedtime.    Yes Historical Provider, MD  nitroGLYCERIN (NITROSTAT) 0.4 MG SL tablet Place 1 tablet (0.4 mg total) under the tongue every 5 (five) minutes x 3 doses as needed for chest pain. 08/04/16 04/05/18 Yes Josue Hector, MD  prasugrel (EFFIENT) 10 MG TABS tablet Take 1 tablet (10 mg total) by mouth daily. 08/04/16  Yes Josue Hector, MD  rosuvastatin (CRESTOR) 10 MG tablet Take 1 tablet (10 mg total) by mouth daily. 07/19/16  Yes Josue Hector, MD  venlafaxine (EFFEXOR-XR) 75 MG 24 hr capsule Take 75 mg by mouth daily.     Yes Historical Provider, MD    ROS:  Out of a complete 14 system review of symptoms, the patient complains only of the following symptoms, and all other reviewed systems are negative.  Memory loss  Blood pressure (!) 149/75, pulse 69, height 5\' 6"  (1.676 m), weight 198 lb (89.8 kg).  Physical Exam  General: The patient is alert and cooperative at the time of the examination.  Skin: No  significant peripheral edema is noted.   Neurologic Exam  Mental status: The patient is alert and oriented x 3 at the time of the examination. The patient has apparent normal recent and remote memory, with an apparently normal attention span and concentration ability.   Cranial nerves: Facial symmetry is present. Speech is normal, no aphasia or dysarthria is noted. Extraocular movements are full. Visual fields are full. Pupils are equal, round, and reactive to light. Discs are flat bilaterally. Good venous pulsations are seen.   Motor: The patient has good strength in all 4 extremities.  Sensory examination: Soft touch sensation is symmetric on the face, arms, and legs.  Coordination: The  patient has good finger-nose-finger and heel-to-shin bilaterally.  Gait and station: The patient has a normal gait. Tandem gait is minimally unsteady. Romberg is negative. No drift is seen.  Reflexes: Deep tendon reflexes are symmetric.   CT brain 05/18/16:  IMPRESSION:  This CT scan of the head without contrast shows the following: 1.    Small 4 mm left frontal convexity hygroma at the site of the prior acute subdural hematoma noted on the 05/18/2016 CT scan.    This measured 7 mm on the prior CT scan. 2.    Resolution of the left temporal and interhemispheric subdural hematomas noted on the prior CT scan   MRI brain 04/12/16:  IMPRESSION:  This MRI of the brain with and without contrast shows the following: 1.   Scattered T2/FLAIR hyperintense foci in a pattern and configuration consistent with chronic demyelinating plaque associated with multiple sclerosis. None of the foci appears to be acute.  2.   Compared to the MRI dated 03/26/2015, there is no interval change.  * MRI scan images were reviewed online. I agree with the written report.    Assessment/Plan:  1. Multiple sclerosis   The patient is doing fairly well at this time, she still has a perceived memory issue that could  potentially be related to medication such as gabapentin and amitriptyline. I discussed coming off of amitriptyline and switching to trazodone for sleep, she is not sure that she wants to pursue this medication change. The patient will remain on Tecfidera, blood work will be done today, she will follow-up in 6 months. She has recovered well from the subdural hematoma.   Jill Alexanders MD 10/12/2016 3:19 PM  [facility] [address] [place], [place] [address]  Phone [phone] Fax [phone]

## 2016-10-12 NOTE — Telephone Encounter (Signed)
Called pt and r/s appt from 730am same day d/t weather to 3pm. CW,MD had cx. Advised pt to check in 230pm. Patient verbalized understanding.

## 2016-10-13 LAB — CBC WITH DIFFERENTIAL/PLATELET
Basophils Absolute: 0 10*3/uL (ref 0.0–0.2)
Basos: 0 %
EOS (ABSOLUTE): 0.3 10*3/uL (ref 0.0–0.4)
Eos: 4 %
Hematocrit: 36.3 % (ref 34.0–46.6)
Hemoglobin: 12.3 g/dL (ref 11.1–15.9)
Immature Grans (Abs): 0 10*3/uL (ref 0.0–0.1)
Immature Granulocytes: 0 %
Lymphocytes Absolute: 0.7 10*3/uL (ref 0.7–3.1)
Lymphs: 9 %
MCH: 31.6 pg (ref 26.6–33.0)
MCHC: 33.9 g/dL (ref 31.5–35.7)
MCV: 93 fL (ref 79–97)
Monocytes Absolute: 0.6 10*3/uL (ref 0.1–0.9)
Monocytes: 9 %
Neutrophils Absolute: 5.5 10*3/uL (ref 1.4–7.0)
Neutrophils: 78 %
Platelets: 220 10*3/uL (ref 150–379)
RBC: 3.89 x10E6/uL (ref 3.77–5.28)
RDW: 13.4 % (ref 12.3–15.4)
WBC: 7.1 10*3/uL (ref 3.4–10.8)

## 2016-10-13 LAB — COMPREHENSIVE METABOLIC PANEL
ALT: 26 IU/L (ref 0–32)
AST: 18 IU/L (ref 0–40)
Albumin/Globulin Ratio: 1.7 (ref 1.2–2.2)
Albumin: 4.5 g/dL (ref 3.6–4.8)
Alkaline Phosphatase: 59 IU/L (ref 39–117)
BUN/Creatinine Ratio: 24 (ref 12–28)
BUN: 16 mg/dL (ref 8–27)
Bilirubin Total: 0.3 mg/dL (ref 0.0–1.2)
CO2: 30 mmol/L — ABNORMAL HIGH (ref 18–29)
Calcium: 9.8 mg/dL (ref 8.7–10.3)
Chloride: 95 mmol/L — ABNORMAL LOW (ref 96–106)
Creatinine, Ser: 0.68 mg/dL (ref 0.57–1.00)
GFR calc Af Amer: 108 mL/min/{1.73_m2} (ref >59)
GFR calc non Af Amer: 94 mL/min/{1.73_m2} (ref >59)
Globulin, Total: 2.7 g/dL (ref 1.5–4.5)
Glucose: 126 mg/dL — ABNORMAL HIGH (ref 65–99)
Potassium: 4.3 mmol/L (ref 3.5–5.2)
Sodium: 139 mmol/L (ref 134–144)
Total Protein: 7.2 g/dL (ref 6.0–8.5)

## 2016-10-30 ENCOUNTER — Other Ambulatory Visit: Payer: Self-pay | Admitting: Cardiovascular Disease

## 2016-11-02 ENCOUNTER — Encounter: Payer: Self-pay | Admitting: Cardiovascular Disease

## 2016-11-12 NOTE — Progress Notes (Signed)
Patient ID: Daisy Bennett, female   DOB: 1956/02/04, 61 y.o.   MRN: [id number]   Daisy Bennett is seen f/u CAD  post stenting 2011  She had a non ST elevation MI with subsequent stenting of an OM. She has diffuse 3VD with a total right that is collateralized. She had smoewhat atypical presentation with her DM. Daisy Bennett She has a benign systolic murmur and does not need SBE No chest pain. [place] more. Has LE edema that seems to be getting worse   Lots of dermatology issues had multiple lesions removed from right leg and thigh She is fatigued Husband having hip surgery in [place] next month   She has stopped smoking a month ago   October 2017:  Fell and hit head with subdural. Effient was held   No chest pain needs new nitro   ROS: Denies fever, malais, weight loss, blurry vision, decreased visual acuity, cough, sputum, SOB, hemoptysis, pleuritic pain, palpitaitons, heartburn, abdominal pain, melena, lower extremity edema, claudication, or rash.  All other systems reviewed and negative  General: Affect appropriate Healthy:  appears stated age 41: normal Neck supple with no adenopathy JVP normal no bruits no thyromegaly Lungs clear with no wheezing and good diaphragmatic motion Heart:  S1/S2 1/6 SEM  murmur, no rub, gallop or click PMI normal Abdomen: benighn, BS positve, no tenderness, no AAA no bruit.  No HSM or HJR Distal pulses intact with no bruits No edema Neuro non-focal Skin warm and dry  Recent excision and biopsy right leg  No muscular weakness   Current Outpatient Prescriptions  Medication Sig Dispense Refill  . amitriptyline (ELAVIL) 50 MG tablet Take 1 tablet (50 mg total) by mouth daily. 90 tablet 3  . amLODipine (NORVASC) 10 MG tablet Take 1 tablet (10 mg total) by mouth daily. 90 tablet 3  . aspirin EC 325 MG EC tablet Take 325 mg by mouth daily.     . carvedilol (COREG) 6.25 MG tablet Take 1 tablet (6.25 mg total) by mouth 2 (two) times daily with a meal. 180 tablet 3   . cholecalciferol (VITAMIN D) 1000 units tablet Take 1,000 Units by mouth daily.    Daisy Bennett CINNAMON PO Take 2,000 mg by mouth daily.     . Dimethyl Fumarate (TECFIDERA) 240 MG CPDR Take 1 capsule (240 mg total) by mouth 2 (two) times daily. 60 capsule 11  . esomeprazole (NEXIUM) 20 MG capsule Take 20 mg by mouth daily at 12 noon.    . fenofibrate 160 MG tablet Take 1 tablet (160 mg total) by mouth daily. 90 tablet 3  . Ferrous Sulfate (IRON) 325 (65 FE) MG TABS Take 325 mg by mouth daily.     . fish oil-omega-3 fatty acids 1000 MG capsule Take 1 g by mouth daily.     Daisy Bennett gabapentin (NEURONTIN) 600 MG tablet Take 1 tablet (600 mg total) by mouth 2 (two) times daily. 180 tablet 3  . glimepiride (AMARYL) 2 MG tablet Take 1 tablet (2 mg total) by mouth 2 (two) times daily. 180 tablet 1  . glucose blood test strip DX:E11.9, monitor fasting BG daily. 100 each 11  . hydrochlorothiazide (HYDRODIURIL) 25 MG tablet Take 25 mg by mouth daily.  1  . levothyroxine (SYNTHROID, LEVOTHROID) 100 MCG tablet Take 1 tablet (100 mcg total) by mouth daily. 90 tablet 3  . losartan-hydrochlorothiazide (HYZAAR) 100-25 MG tablet TAKE 1 TABLET BY MOUTH DAILY. 90 tablet 0  . metFORMIN (GLUCOPHAGE) 1000 MG tablet Take 1 tablet (  1,000 mg total) by mouth 2 (two) times daily with a meal. 180 tablet 5  . Multiple Vitamin (MULTIVITAMINS PO) Take 1 tablet by mouth at bedtime.     . nitroGLYCERIN (NITROSTAT) 0.4 MG SL tablet Place 1 tablet (0.4 mg total) under the tongue every 5 (five) minutes x 3 doses as needed for chest pain. 25 tablet 4  . prasugrel (EFFIENT) 10 MG TABS tablet Take 1 tablet (10 mg total) by mouth daily. 90 tablet 0  . rosuvastatin (CRESTOR) 10 MG tablet Take 1 tablet (10 mg total) by mouth daily. 90 tablet 2  . venlafaxine (EFFEXOR-XR) 75 MG 24 hr capsule Take 75 mg by mouth daily.       No current facility-administered medications for this visit.    Facility-Administered Medications Ordered in Other Visits   Medication Dose Route Frequency Provider Last Rate Last Dose  . gadopentetate dimeglumine (MAGNEVIST) injection 18 mL  18 mL Intravenous Once PRN Kathrynn Ducking, MD        Allergies  Betaseron [interferon beta-1b]; Topamax [topiramate]; and Doxycycline  Electrocardiogram:  03/14/13   SR normal rate 64   SR rate 67  Normal no change 09/18/14  01/13/16  SR rate 73  Nonspecific ST changes   Assessment and Plan  CAD: Collateralized RCA and stent to OM in 2011 no angina continue medical Rx  New nitro called in  HTN: Well controlled.  Continue current medications and low sodium Dash type diet.   DM:  F/u primary meds being adjusted   Lab Results  Component Value Date   HGBA1C 6.1 11/15/2016   MS:  Sees Dr Jannifer Franklin  Thyroid:  On replacement labs with primary  Chol: continue statin  Smoking   Has quit again in February Husband has stopped too  Subdural:  Concussive symptoms and memory back to normal working again improved   [employer]

## 2016-11-15 ENCOUNTER — Ambulatory Visit (INDEPENDENT_AMBULATORY_CARE_PROVIDER_SITE_OTHER): Payer: 59 | Admitting: Family Medicine

## 2016-11-15 ENCOUNTER — Encounter: Payer: Self-pay | Admitting: Family Medicine

## 2016-11-15 VITALS — BP 137/84 | HR 85 | Temp 97.6°F | Resp 20 | Ht 66.0 in | Wt 194.0 lb

## 2016-11-15 DIAGNOSIS — E1121 Type 2 diabetes mellitus with diabetic nephropathy: Secondary | ICD-10-CM | POA: Diagnosis not present

## 2016-11-15 DIAGNOSIS — Z23 Encounter for immunization: Secondary | ICD-10-CM

## 2016-11-15 DIAGNOSIS — E782 Mixed hyperlipidemia: Secondary | ICD-10-CM | POA: Diagnosis not present

## 2016-11-15 DIAGNOSIS — Z6830 Body mass index (BMI) 30.0-30.9, adult: Secondary | ICD-10-CM

## 2016-11-15 DIAGNOSIS — E119 Type 2 diabetes mellitus without complications: Secondary | ICD-10-CM

## 2016-11-15 DIAGNOSIS — Z7902 Long term (current) use of antithrombotics/antiplatelets: Secondary | ICD-10-CM

## 2016-11-15 DIAGNOSIS — G35 Multiple sclerosis: Secondary | ICD-10-CM

## 2016-11-15 DIAGNOSIS — G35D Multiple sclerosis, unspecified: Secondary | ICD-10-CM

## 2016-11-15 LAB — POCT GLYCOSYLATED HEMOGLOBIN (HGB A1C): Hemoglobin A1C: 6.1

## 2016-11-15 NOTE — Patient Instructions (Addendum)
Your a1c is 6.1 Change: Glimepiride 1 tab in morning, 1/2 tab in evening.  [place]  See you in three months

## 2016-11-15 NOTE — Addendum Note (Signed)
Addended by: Leota Jacobsen on: 11/15/2016 11:33 AM   Modules accepted: Orders

## 2016-11-15 NOTE — Progress Notes (Signed)
Patient ID: Daisy Bennett, female   DOB: [birth year], 61 y.o.   MRN: [id number]   Subjective:    Patient ID: Daisy Bennett, female    DOB: 08/09/1955, 61 y.o.   MRN: [id number]   Patient Care Team    Relationship Specialty Notifications Start End  Ma Hillock, DO PCP - General Family Medicine  03/10/15   Devra Dopp, MD Referring Physician Dermatology  02/10/16   Kathrynn Ducking, MD Consulting Physician Neurology  05/18/16   Linda Hedges, DO Consulting Physician Obstetrics and Gynecology  06/04/16   D Orion Modest, OD  Optometry  08/17/16      Diabetes   Hypertension   Diabetes/BMI30: Patient presents for routine scheduled diabetes follow-up. Last A1c was  7.2. Pt has been able to start back exercising. She has lost 4 lbs. She is walking, on bowling league and started back at the gym. She reports compliance with   Amaryl  2 mg daily and metformin 1000 mg BID and she is tolerating them. She is still  not checking her sugars. She denies dizziness, numbness or tingling (worsening) in her extremities.   She is on gabapentin 600 mg BID. - POCT HgB A1C-->  6.5 --> 7.2--> 6.1 today - PSV 23 given 10/2015, Prevnar today 10/2016 - foot exam today 11/15/2016 - Eye exam 2/26//2018; Dr. Venetia Constable - Microalbumin: on an ARB - flu shot UTD 05/12/2016  Hypertension/hyperlipidemia: She reports compliance with her medication regimen of Norvasc 10 mg, Coreg 6.25mg  BID, HCTZ 25 mg QD, Losartan/HCTZ 100-25. H/O CAD, NSTEMI, post stent 2011 (OM)-collateralized RCA.  Pt is on BB, statin (low dose), ACE, fenofibrate, ASA 325. She follows with Dr. Johnsie Cancel, cardiology. She denies chest pain, shortness of breath, Le edema or dyspnea. She has had right sided "chest burning" on occassions. She reports she has an appt with her cardiologist coming up and will address with him. She does take nexium for GERD.   Immunization History  Administered Date(s) Administered  . Influenza,inj,Quad PF,36+ Mos  04/16/2015, 05/12/2016  . Pneumococcal Polysaccharide-23 10/14/2015  . Tdap 12/26/2013     Past Medical History:  Diagnosis Date  . Basal cell carcinoma   . Cancer ([place])    basal and squamous cell skin cancer  . DM (diabetes mellitus) ([place])   . Goiter, unspecified   . History of colonic polyps 2012  . Multiple sclerosis ([place])   . Non-ST elevated myocardial infarction (non-STEMI) ([place])   . SCCA (squamous cell carcinoma) of skin    right shin  . SEMI (subendocardial myocardial infarction) ([place]) 09/29/09   TOTAL RCA WITH STENT TO OM BRANCH  . Shingles   . Squamous cell carcinoma   . Tobacco abuse   . Unspecified essential hypertension   . Unspecified hypothyroidism    Allergies  Allergen Reactions  . Betaseron [Interferon Beta-1b] Other (See Comments)    Increased LFT's  . Topamax [Topiramate]     Cognitive slowing  . Doxycycline Other (See Comments)    Thrush,dizziness   Social History   Social History  . Marital status: Married    Spouse name: N/A  . Number of children: 2  . Years of education: 14   Occupational History  . [employer]    Social History Main Topics  . Smoking status: Former Smoker    Packs/day: 1.00    Quit date: 12/16/2015  . Smokeless tobacco: Never Used  . Alcohol use 0.0 oz/week     Comment: OCCASIONAL  ALCOHOL USE.  . Drug use: No  . Sexual activity: Yes   Other Topics Concern  . Not on file   Social History Narrative   The patient lives with husband. 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    Patient drinks occasionally, does not chew tobacco, does not use recreational drugs. She does drink occasional caffeine. She does wear her seatbelt. She does wear bike helmet riding a bike. She does exercise 3 times a week. She does not wear hearing aids or dentures. There is a smoke alarm in her home. There are no firearms in her home. She feels safe in her relationship. She has never experienced physical  abuse.   Patient sleeps 7-8 hours a night.   Patient drinks 2 cups of caffeine daily.   Patient is right handed.    Review of Systems Negative, with the exception of above mentioned in HPI     Objective:   Physical Exam BP 137/84 (BP Location: Left Arm, Patient Position: Sitting, Cuff Size: Large)   Pulse 85   Temp 97.6 F (36.4 C)   Resp 20   Ht 5\' 6"  (1.676 m)   Wt 194 lb (88 kg)   SpO2 98%   BMI 31.31 kg/m  Gen: Afebrile. No acute distress.  HENT: AT. [place].MMM. B Eyes:Pupils Equal Round Reactive to light, Extraocular movements intact,  Conjunctiva without redness, discharge or icterus. Neck/lymp/endocrine: Supple,no lymphadenopathy, no thyromegaly CV: RRR 2/6 SM, no edema, +2/4 P posterior tibialis pulses Chest: CTAB, no wheeze or crackles Abd: Soft. NTND. BS present. no Masses palpated.  Skin: no rashes, purpura or petechiae. Intact, WWP Neuro:  Normal gait. PERLA. EOMi. Alert. Oriented x3    Diabetic Foot Exam - Simple   Simple Foot Form Diabetic Foot exam was performed with the following findings:  Yes 11/15/2016  9:34 AM  Visual Inspection No deformities, no ulcerations, no other skin breakdown bilaterally:  Yes Sensation Testing Intact to touch and monofilament testing bilaterally:  Yes Pulse Check Posterior Tibialis and Dorsalis pulse intact bilaterally:  Yes Comments     Assessment & Plan:  Daisy Bennett is a 61 y.o. present for diabetes follow up.  Diabetes mellitus without complication (HCC)/BMI 30 - POCT HgB A1C:  6.5--> 7.2--> 6.1 today . She is back to exercising and a1c has improved. Cut back Amaryl to 2 mg morning, 1 mg at night.  - continue metformin 1000 mg BID, refills provided.  - increase exercise regimen and continue dietary modification.  - PSV 23 given 10/2015, Prevnar due 09/2016 (next DM appt) - foot exam 10/14/2015 - Eye exam 09/2016, Dr. Venetia Constable. Records requested - Microalbumin: on an ARB - flu shot UTD 05/12/2016 - f/u 3 months    Hypertension/hyperlipidemia/Antiplatelet or antithrombotic long-term use:  - Stable today.  - follows with cardiology, Dr. Johnsie Cancel who prescribed medications.  - low salt, increase exercise.  - Continue to monitor at home if above 140/90 will need to see sooner and discuss.  - Continue fenofibrate --> recheck due 06/2017 - continue statin.  - continue Effient - maintain routine cardiology follow ups.   f/U 3 months   Electronically Signed by: Howard Pouch, DO [facility]- [place]

## 2016-11-17 ENCOUNTER — Encounter: Payer: Self-pay | Admitting: Cardiovascular Disease

## 2016-11-17 ENCOUNTER — Ambulatory Visit (INDEPENDENT_AMBULATORY_CARE_PROVIDER_SITE_OTHER): Payer: 59 | Admitting: Cardiovascular Disease

## 2016-11-17 VITALS — BP 130/60 | HR 72 | Ht 66.0 in | Wt 194.8 lb

## 2016-11-17 DIAGNOSIS — I25119 Atherosclerotic heart disease of native coronary artery with unspecified angina pectoris: Secondary | ICD-10-CM

## 2016-11-17 DIAGNOSIS — I2583 Coronary atherosclerosis due to lipid rich plaque: Secondary | ICD-10-CM | POA: Diagnosis not present

## 2016-11-17 DIAGNOSIS — I2510 Atherosclerotic heart disease of native coronary artery without angina pectoris: Secondary | ICD-10-CM

## 2016-11-17 MED ORDER — AMLODIPINE BESYLATE 10 MG PO TABS: 10.0000 mg | ORAL_TABLET | Freq: Every day | ORAL | 3 refills | Status: DC

## 2016-11-17 MED ORDER — CARVEDILOL 6.25 MG PO TABS: 6.2500 mg | ORAL_TABLET | Freq: Two times a day (BID) | ORAL | 3 refills | Status: DC

## 2016-11-17 MED ORDER — NITROGLYCERIN 0.4 MG SL SUBL: 0.4000 mg | SUBLINGUAL_TABLET | SUBLINGUAL | 4 refills | Status: DC | PRN

## 2016-11-17 NOTE — Patient Instructions (Signed)

## 2016-11-18 ENCOUNTER — Other Ambulatory Visit: Payer: Self-pay | Admitting: Cardiovascular Disease

## 2016-12-29 ENCOUNTER — Other Ambulatory Visit: Payer: Self-pay | Admitting: *Deleted

## 2016-12-29 NOTE — Telephone Encounter (Signed)
Request received for HCTZ we have not filled this before. It was last Rx'd 05/12/16 by another provider for 90 tabs with 1 refill. We last saw her 11/15/16. Please advise

## 2016-12-30 MED ORDER — HYDROCHLOROTHIAZIDE 25 MG PO TABS: 25.0000 mg | ORAL_TABLET | Freq: Every day | ORAL | 1 refills | Status: DC

## 2017-01-19 DIAGNOSIS — M25562 Pain in left knee: Secondary | ICD-10-CM | POA: Diagnosis not present

## 2017-01-25 DIAGNOSIS — M25562 Pain in left knee: Secondary | ICD-10-CM | POA: Diagnosis not present

## 2017-01-29 ENCOUNTER — Other Ambulatory Visit: Payer: Self-pay | Admitting: Cardiovascular Disease

## 2017-02-01 ENCOUNTER — Other Ambulatory Visit: Payer: Self-pay | Admitting: *Deleted

## 2017-02-01 MED ORDER — GLIMEPIRIDE 2 MG PO TABS: 2.0000 mg | ORAL_TABLET | Freq: Two times a day (BID) | ORAL | 0 refills | Status: DC

## 2017-02-03 ENCOUNTER — Other Ambulatory Visit: Payer: Self-pay | Admitting: Neurology

## 2017-02-08 DIAGNOSIS — Z85828 Personal history of other malignant neoplasm of skin: Secondary | ICD-10-CM | POA: Diagnosis not present

## 2017-02-08 DIAGNOSIS — Z08 Encounter for follow-up examination after completed treatment for malignant neoplasm: Secondary | ICD-10-CM | POA: Diagnosis not present

## 2017-02-08 DIAGNOSIS — L570 Actinic keratosis: Secondary | ICD-10-CM | POA: Diagnosis not present

## 2017-02-10 DIAGNOSIS — S83232A Complex tear of medial meniscus, current injury, left knee, initial encounter: Secondary | ICD-10-CM | POA: Diagnosis not present

## 2017-02-14 ENCOUNTER — Encounter: Payer: Self-pay | Admitting: Family Medicine

## 2017-02-14 ENCOUNTER — Ambulatory Visit (INDEPENDENT_AMBULATORY_CARE_PROVIDER_SITE_OTHER): Payer: 59 | Admitting: Family Medicine

## 2017-02-14 VITALS — BP 135/80 | HR 68 | Temp 97.8°F | Resp 20 | Ht 66.0 in | Wt 187.5 lb

## 2017-02-14 DIAGNOSIS — E119 Type 2 diabetes mellitus without complications: Secondary | ICD-10-CM | POA: Diagnosis not present

## 2017-02-14 DIAGNOSIS — E1169 Type 2 diabetes mellitus with other specified complication: Secondary | ICD-10-CM | POA: Insufficient documentation

## 2017-02-14 DIAGNOSIS — E782 Mixed hyperlipidemia: Secondary | ICD-10-CM

## 2017-02-14 DIAGNOSIS — I10 Essential (primary) hypertension: Secondary | ICD-10-CM | POA: Diagnosis not present

## 2017-02-14 DIAGNOSIS — E785 Hyperlipidemia, unspecified: Secondary | ICD-10-CM | POA: Insufficient documentation

## 2017-02-14 DIAGNOSIS — E118 Type 2 diabetes mellitus with unspecified complications: Secondary | ICD-10-CM | POA: Insufficient documentation

## 2017-02-14 LAB — POCT GLYCOSYLATED HEMOGLOBIN (HGB A1C): Hemoglobin A1C: 6

## 2017-02-14 MED ORDER — GLIMEPIRIDE 2 MG PO TABS: ORAL_TABLET | ORAL | 1 refills | Status: DC

## 2017-02-14 NOTE — Progress Notes (Signed)
Patient ID: Daisy Bennett, female   DOB: [birth year], 61 y.o.   MRN: [id number]   Subjective:    Patient ID: Daisy Bennett, female    DOB: 1956/06/27, 61 y.o.   MRN: [id number]   Patient Care Team    Relationship Specialty Notifications Start End  Ma Hillock, DO PCP - General Family Medicine  03/10/15   Devra Dopp, MD Referring Physician Dermatology  02/10/16   Kathrynn Ducking, MD Consulting Physician Neurology  05/18/16   Linda Hedges, [place] Physician Obstetrics and Gynecology  06/04/16   Anell Barr, OD  Optometry  08/17/16      Diabetes   Hypertension   Diabetes/BMI30: Patient presents for routine scheduled diabetes follow-up. Last A1c was  6.1. Pt has been able to start back exercising. She is kayaking, [place]. She has lost 7 lbs. She is walking, on bowling league and started back at the gym. She reports compliance with   Amaryl  2 mg/1mg  daily and metformin 1000 mg BID and she is tolerating them. She is still  not checking her sugars. Patient denies dizziness, hyperglycemic or hypoglycemic events. Patient denies numbness, tingling in the extremities or nonhealing wounds of feet.    She is on gabapentin 600 mg BID. - POCT HgB A1C-->  6.5 --> 7.2--> 6.1 --> 6.0 today - PSV 23 given 10/2015, Prevnar today 10/2016 - foot exam today 11/15/2016 - Eye exam 2/26//2018; Dr. Venetia Constable - Microalbumin: on an ARB - flu shot UTD 05/12/2016  Hypertension/hyperlipidemia: She reports compliance with her medication regimen of Norvasc 10 mg, Coreg 6.25mg  BID, HCTZ 25 mg QD, Losartan/HCTZ 100-25. H/O CAD, NSTEMI, post stent 2011 (OM)-collateralized RCA.  Pt is on BB, statin (low dose), ACE, fenofibrate, ASA 325. She follows with Dr. Johnsie Cancel. Patient denies chest pain, shortness of breath, dizziness or lower extremity edema.     Immunization History  Administered Date(s) Administered  . Influenza,inj,Quad PF,36+ Mos  04/16/2015, 05/12/2016  . Pneumococcal Conjugate-13 11/15/2016  . Pneumococcal Polysaccharide-23 08/02/2009, 10/14/2015  . Tdap 12/26/2013     Past Medical History:  Diagnosis Date  . Basal cell carcinoma   . Cancer ([place])    basal and squamous cell skin cancer  . DM (diabetes mellitus) ([place])   . Goiter, unspecified   . History of colonic polyps 2012  . Multiple sclerosis ([place])   . Non-ST elevated myocardial infarction (non-STEMI) ([place])   . SCCA (squamous cell carcinoma) of skin    right shin  . SEMI (subendocardial myocardial infarction) ([place]) 09/29/09   TOTAL RCA WITH STENT TO OM BRANCH  . Shingles   . Squamous cell carcinoma   . Tobacco abuse   . Unspecified essential hypertension   . Unspecified hypothyroidism    Allergies  Allergen Reactions  . Betaseron [Interferon Beta-1b] Other (See Comments)    Increased LFT's  . Topamax [Topiramate]     Cognitive slowing  . Doxycycline Other (See Comments)    Thrush,dizziness   Social History   Social History  . Marital status: Married    Spouse name: N/A  . Number of children: 2  . Years of education: 14   Occupational History  . [employer]    Social History Main Topics  . Smoking status: Former Smoker    Packs/day: 1.00    Quit date: 12/16/2015  . Smokeless tobacco: Never Used  . Alcohol use 0.0 oz/week  Comment: OCCASIONAL ALCOHOL USE.  . Drug use: No  . Sexual activity: Yes   Other Topics Concern  . Not on file   Social History Narrative   The patient lives with husband. 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    Patient drinks occasionally, does not chew tobacco, does not use recreational drugs. She does drink occasional caffeine. She does wear her seatbelt. She does wear bike helmet riding a bike. She does exercise 3 times a week. She does not wear hearing aids or dentures. There is a smoke alarm in her home. There are no firearms in her home. She feels safe in her  relationship. She has never experienced physical abuse.   Patient sleeps 7-8 hours a night.   Patient drinks 2 cups of caffeine daily.   Patient is right handed.    Review of Systems Negative, with the exception of above mentioned in HPI     Objective:   Physical Exam BP 135/80 (BP Location: Right Arm, Patient Position: Sitting, Cuff Size: Normal)   Pulse 68   Temp 97.8 F (36.6 C)   Resp 20   Ht 5\' 6"  (1.676 m)   Wt 187 lb 8 oz (85 kg)   SpO2 100%   BMI 30.26 kg/m  Gen: Afebrile. No acute distress.  HENT: AT. [place].MMM. Eyes:Pupils Equal Round Reactive to light, Extraocular movements intact,  Conjunctiva without redness, discharge or icterus. CV: RRR 1/6 SM, no edema, +2/4 P posterior tibialis pulses Chest: CTAB, no wheeze or crackles Abd: Soft. NTND. BS present. no Masses palpated.  Skin: no rashes, purpura or petechiae.  Neuro: Normal gait. PERLA. EOMi. Alert.   Results for orders placed or performed in visit on 02/14/17 (from the past 24 hour(s))  POCT glycosylated hemoglobin (Hb A1C)     Status: Abnormal   Collection Time: 02/14/17  9:59 AM  Result Value Ref Range   Hemoglobin A1C 6.0      Assessment & Plan:  Daisy Bennett is a 61 y.o. present for diabetes follow up.  Diabetes mellitus without complication (HCC)/BMI 30 - POCT HgB A1C:  6.5--> 7.2--> 6.1--> 6.0  today . She is back to exercising and a1c has improved.  - continue  Amaryl to 2 mg morning, 1 mg at night.  - continue metformin 1000 mg BID, refills provided.  - increase exercise regimen and continue dietary modification.  - PSV 23 given 10/2015, Prevnar completed 11/15/2016 - foot exam 10/14/2015 - Eye exam 09/2016, Dr. Venetia Constable. Records requested - Microalbumin: on an ARB - flu shot UTD 05/12/2016 - f/u 4 months, sooner if any dizziness and/or more weight loss, would decrease Amaryl to QD.   Hypertension/hyperlipidemia/Antiplatelet or antithrombotic long-term use:  - stable today - follows with  cardiology, Dr. Johnsie Cancel who prescribed medications.  - low salt, increase exercise.  - Continue to monitor at home if above 140/90 will need to see sooner and discuss.  - Continue fenofibrate --> recheck due 06/2017 - continue statin.  - continue Effient - maintain routine cardiology follow ups.   f/U 4 months   Electronically Signed by: Howard Pouch, DO [facility]- [place]

## 2017-02-14 NOTE — Patient Instructions (Addendum)
Continue medicine as is, it you lose weight or experience any dizziness then I would want to know and decrease the Amaryl to once daily.   Follow up in 3.5 months --> 4 months   Please help [place] help you:  We are honored you have chosen [facility] for your [facility] home. Below you will find basic instructions that you may need to access in the future. Please help [place] help you by reading the instructions, which cover many of the frequent questions we experience.   Prescription refills and request:  -In order to allow more efficient response time, please call your pharmacy for all refills. They will forward the request electronically to [place]. This allows for the quickest possible response. Request left on a nurse line can take longer to refill, since these are checked as time allows between office patients and other phone calls.  - refill request can take up to 3-5 working days to complete.  - If request is sent electronically and request is appropiate, it is usually completed in 1-2 business days.  - all patients will need to be seen routinely for all chronic medical conditions requiring prescription medications (see follow-up below). If you are overdue for follow up on your condition, you will be asked to make an appointment and we will call in enough medication to cover you until your appointment (up to 30 days).  - all controlled substances will require a face to face visit to request/refill.  - if you desire your prescriptions to go through a new pharmacy, and have an active script at original pharmacy, you will need to call your pharmacy and have scripts transferred to new pharmacy. This is completed between the pharmacy locations and not by your provider.    Results: If any images or labs were ordered, it can take up to 1 week to get results depending on the test ordered and the lab/facility running and resulting the test. - Normal or stable results, which do not need further discussion,  may be released to your mychart immediately with attached note to you. A call may not be generated for normal results. Please make certain to sign up for mychart. If you have questions on how to activate your mychart you can call the front office.  - If your results need further discussion, our office will attempt to contact you via phone, and if unable to reach you after 2 attempts, we will release your abnormal result to your mychart with instructions.  - All results will be automatically released in mychart after 1 week.  - Your provider will provide you with explanation and instruction on all relevant material in your results. Please keep in mind, results and labs may appear confusing or abnormal to the untrained eye, but it does not mean they are actually abnormal for you personally. If you have any questions about your results that are not covered, or you desire more detailed explanation than what was provided, you should make an appointment with your provider to do so.   Our office handles many outgoing and incoming calls daily. If we have not contacted you within 1 week about your results, please check your mychart to see if there is a message first and if not, then contact our office.  In helping with this matter, you help decrease call volume, and therefore allow [place] to be able to respond to patients needs more efficiently.   Acute office visits (sick visit):  An acute visit is  intended for a new problem and are scheduled in shorter time slots to allow schedule openings for patients with new problems. This is the appropriate visit to discuss a new problem. In order to provide you with excellent quality medical care with proper time for you to explain your problem, have an exam and receive treatment with instructions, these appointments should be limited to one new problem per visit. If you experience a new problem, in which you desire to be addressed, please make an acute office visit, we save  openings on the schedule to accommodate you. Please do not save your new problem for any other type of visit, let us take care of it properly and quickly for you.   Follow up visits:  Depending on your condition(s) your provider will need to see you routinely in order to provide you with quality care and prescribe medication(s). Most chronic conditions (Example: hypertension, Diabetes, depression/anxiety... etc), require visits a couple times a year. Your provider will instruct you on proper follow up for your personal medical conditions and history. Please make certain to make follow up appointments for your condition as instructed. Failing to do so could result in lapse in your medication treatment/refills. If you request a refill, and are overdue to be seen on a condition, we will always provide you with a 30 day script (once) to allow you time to schedule.    Medicare wellness (well visit): - we have a wonderful Nurse Maudie Mercury), that will meet with you and provide you will yearly medicare wellness visits. These visits should occur yearly (can not be scheduled less than 1 calendar year apart) and cover preventive health, immunizations, advance directives and screenings you are entitled to yearly through your medicare benefits. Do not miss out on your entitled benefits, this is when medicare will pay for these benefits to be ordered for you.  These are strongly encouraged by your provider and is the appropriate type of visit to make certain you are up to date with all preventive health benefits. If you have not had your medicare wellness exam in the last 12 months, please make certain to schedule one by calling the office and schedule your medicare wellness with Maudie Mercury as soon as possible.   Yearly physical (well visit):  - Adults are recommended to be seen yearly for physicals. Check with your insurance and date of your last physical, most insurances require one calendar year between physicals. Physicals  include all preventive health topics, screenings, medical exam and labs that are appropriate for gender/age and history. You may have fasting labs needed at this visit. This is a well visit (not a sick visit), new problems should not be covered during this visit (see acute visit).  - Pediatric patients are seen more frequently when they are younger. Your provider will advise you on well child visit timing that is appropriate for your their age. - This is not a medicare wellness visit. Medicare wellness exams do not have an exam portion to the visit. Some medicare companies allow for a physical, some do not allow a yearly physical. If your medicare allows a yearly physical you can schedule the medicare wellness with our nurse Maudie Mercury and have your physical with your provider after, on the same day. Please check with insurance for your full benefits.   Late Policy/No Shows:  - all new patients should arrive 15-30 minutes earlier than appointment to allow [place] time  to  obtain all personal demographics,  insurance information and for  you to complete office paperwork. - All established patients should arrive 10-15 minutes earlier than appointment time to update all information and be checked in .  - In our best efforts to run on time, if you are late for your appointment you will be asked to either reschedule or if able, we will work you back into the schedule. There will be a wait time to work you back in the schedule,  depending on availability.  - If you are unable to make it to your appointment as scheduled, please call 24 hours ahead of time to allow [place] to fill the time slot with someone else who needs to be seen. If you do not cancel your appointment ahead of time, you may be charged a no show fee.

## 2017-04-06 ENCOUNTER — Other Ambulatory Visit: Payer: Self-pay | Admitting: Cardiovascular Disease

## 2017-04-19 ENCOUNTER — Ambulatory Visit: Payer: 59 | Admitting: Adult Health

## 2017-04-21 DIAGNOSIS — Z01419 Encounter for gynecological examination (general) (routine) without abnormal findings: Secondary | ICD-10-CM | POA: Diagnosis not present

## 2017-04-21 LAB — HM MAMMOGRAPHY

## 2017-04-21 LAB — HM PAP SMEAR

## 2017-04-29 ENCOUNTER — Encounter: Payer: Self-pay | Admitting: *Deleted

## 2017-06-06 NOTE — Progress Notes (Signed)
Patient ID: Daisy Bennett, female   DOB: 1956/01/26, 61 y.o.   MRN: [id number]   Daisy Bennett is seen f/u CAD  post stenting 2011  She had a non ST elevation MI with subsequent stenting of an OM. She has diffuse 3VD with a total right that is collateralized. She had smoewhat atypical presentation with her DM. [name] She has a benign systolic murmur and does not need SBE No chest pain. [place] more. Has LE edema that seems to be getting worse   Lots of dermatology issues had multiple lesions removed from right leg and thigh She is fatigued Husband having hip surgery in [place] next month   She has stopped smoking 2017   October 2017:  Fell and hit head with subdural. Effient was held   No chest pain needs new nitro   ROS: Denies fever, malais, weight loss, blurry vision, decreased visual acuity, cough, sputum, SOB, hemoptysis, pleuritic pain, palpitaitons, heartburn, abdominal pain, melena, lower extremity edema, claudication, or rash.  All other systems reviewed and negative  General: BP 130/72   Pulse 73   Ht 5\' 6"  (1.676 m)   Wt 194 lb 8 oz (88.2 kg)   SpO2 99%   BMI 31.39 kg/m  Affect appropriate Healthy:  appears stated age 60: normal Neck supple with no adenopathy JVP normal no bruits no thyromegaly Lungs clear with no wheezing and good diaphragmatic motion Heart:  S1/S2 no murmur, no rub, gallop or click PMI normal Abdomen: benighn, BS positve, no tenderness, no AAA no bruit.  No HSM or HJR Distal pulses intact with no bruits No edema Neuro non-focal Skin warm and dry No muscular weakness   Current Outpatient Medications  Medication Sig Dispense Refill  . amitriptyline (ELAVIL) 50 MG tablet Take 1 tablet (50 mg total) by mouth daily. 90 tablet 3  . amLODipine (NORVASC) 10 MG tablet Take 1 tablet (10 mg total) by mouth daily. 90 tablet 3  . carvedilol (COREG) 6.25 MG tablet Take 1 tablet (6.25 mg total) by mouth 2 (two) times daily with a meal. 180 tablet 3  .  cholecalciferol (VITAMIN D) 1000 units tablet Take 1,000 Units by mouth daily.    [name] CINNAMON PO Take 2,000 mg by mouth daily.     [name] esomeprazole (NEXIUM) 20 MG capsule Take 20 mg by mouth daily at 12 noon.    . fenofibrate 160 MG tablet Take 1 tablet (160 mg total) by mouth daily. 90 tablet 3  . Ferrous Sulfate (IRON) 325 (65 FE) MG TABS Take 325 mg by mouth daily.     . fish oil-omega-3 fatty acids 1000 MG capsule Take 1 g by mouth daily.     [name] gabapentin (NEURONTIN) 600 MG tablet Take 1 tablet (600 mg total) by mouth 2 (two) times daily. 180 tablet 3  . glimepiride (AMARYL) 2 MG tablet 1 tab in the morning and  1/2 tab in the evening. 135 tablet 1  . glucose blood test strip DX:E11.9, monitor fasting BG daily. 100 each 11  . hydrochlorothiazide (HYDRODIURIL) 25 MG tablet Take 1 tablet (25 mg total) by mouth daily. 90 tablet 1  . levothyroxine (SYNTHROID, LEVOTHROID) 100 MCG tablet Take 1 tablet (100 mcg total) by mouth daily. 90 tablet 3  . losartan-hydrochlorothiazide (HYZAAR) 100-25 MG tablet TAKE 1 TABLET BY MOUTH DAILY. 90 tablet 2  . metFORMIN (GLUCOPHAGE) 1000 MG tablet Take 1 tablet (1,000 mg total) by mouth 2 (two) times daily with a meal. 180 tablet 5  .  Multiple Vitamin (MULTIVITAMINS PO) Take 1 tablet by mouth at bedtime.     . nitroGLYCERIN (NITROSTAT) 0.4 MG SL tablet Place 1 tablet (0.4 mg total) under the tongue every 5 (five) minutes x 3 doses as needed for chest pain. 25 tablet 4  . prasugrel (EFFIENT) 10 MG TABS tablet TAKE 1 TABLET BY MOUTH DAILY 90 tablet 3  . rosuvastatin (CRESTOR) 10 MG tablet TAKE 1 TABLET (10 MG TOTAL) BY MOUTH DAILY. 90 tablet 0  . TECFIDERA 240 MG CPDR TAKE 1 CAPSULE TWICE DAILY 60 capsule 11  . venlafaxine (EFFEXOR-XR) 75 MG 24 hr capsule Take 75 mg by mouth daily.      [name] aspirin EC 81 MG tablet Take 1 tablet (81 mg total) daily by mouth.     No current facility-administered medications for this visit.    Facility-Administered Medications Ordered  in Other Visits  Medication Dose Route Frequency Provider Last Rate Last Dose  . gadopentetate dimeglumine (MAGNEVIST) injection 18 mL  18 mL Intravenous Once PRN Kathrynn Ducking, MD        Allergies  Betaseron [interferon beta-1b]; Topamax [topiramate]; and Doxycycline  Electrocardiogram:  03/14/13   SR normal rate 64   SR rate 67  Normal no change 09/18/14  01/13/16  SR rate 73  Nonspecific ST changes 06/09/17 SR rate 68 normal   Assessment and Plan  CAD: Collateralized RCA and stent to OM in 2011 no angina continue medical Rx  New nitro called in Will order stress myovue to make sure low risk   HTN: Well controlled.  Continue current medications and low sodium Dash type diet.   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

## 2017-06-09 ENCOUNTER — Ambulatory Visit: Payer: 59 | Admitting: Cardiovascular Disease

## 2017-06-09 ENCOUNTER — Encounter: Payer: Self-pay | Admitting: Cardiovascular Disease

## 2017-06-09 VITALS — BP 130/72 | HR 73 | Ht 66.0 in | Wt 194.5 lb

## 2017-06-09 DIAGNOSIS — I25118 Atherosclerotic heart disease of native coronary artery with other forms of angina pectoris: Secondary | ICD-10-CM

## 2017-06-09 DIAGNOSIS — I10 Essential (primary) hypertension: Secondary | ICD-10-CM | POA: Diagnosis not present

## 2017-06-09 DIAGNOSIS — R0600 Dyspnea, unspecified: Secondary | ICD-10-CM

## 2017-06-09 MED ORDER — ASPIRIN EC 81 MG PO TBEC: 81.0000 mg | DELAYED_RELEASE_TABLET | Freq: Every day | ORAL | Status: DC

## 2017-06-09 NOTE — Patient Instructions (Addendum)
Medication Instructions:  Your physician has recommended you make the following change in your medication:  1-DECREASE Aspirin 81 mg by mouth daily   Labwork: NONE  Testing/Procedures: Your physician has requested that you have en exercise stress myoview. For further information please visit [email or web address]. Please follow instruction sheet, as given.  Follow-Up: Your physician wants you to follow-up in: 12 months with Dr. Johnsie Cancel. You will receive a reminder letter in the mail two months in advance. If you don't receive a letter, please call our office to schedule the follow-up appointment.   If you need a refill on your cardiac medications before your next appointment, please call your pharmacy.

## 2017-06-14 ENCOUNTER — Other Ambulatory Visit: Payer: Self-pay | Admitting: *Deleted

## 2017-06-14 ENCOUNTER — Telehealth (HOSPITAL_COMMUNITY): Payer: Self-pay | Admitting: *Deleted

## 2017-06-14 MED ORDER — HYDROCHLOROTHIAZIDE 25 MG PO TABS: 25.0000 mg | ORAL_TABLET | Freq: Every day | ORAL | 0 refills | Status: DC

## 2017-06-14 NOTE — Telephone Encounter (Signed)
Patient given detailed instructions per Myocardial Perfusion Study Information Sheet for the test on 06/16/17 at 0745. Patient notified to arrive 15 minutes early and that it is imperative to arrive on time for appointment to keep from having the test rescheduled.  If you need to cancel or reschedule your appointment, please call the office within 24 hours of your appointment. . Patient verbalized understanding.Aaleah Hirsch, Ranae Palms

## 2017-06-16 ENCOUNTER — Ambulatory Visit (HOSPITAL_COMMUNITY): Payer: 59 | Attending: Cardiology

## 2017-06-16 ENCOUNTER — Encounter (HOSPITAL_COMMUNITY): Payer: 59

## 2017-06-16 DIAGNOSIS — I25118 Atherosclerotic heart disease of native coronary artery with other forms of angina pectoris: Secondary | ICD-10-CM

## 2017-06-16 DIAGNOSIS — I2510 Atherosclerotic heart disease of native coronary artery without angina pectoris: Secondary | ICD-10-CM | POA: Diagnosis not present

## 2017-06-16 DIAGNOSIS — E119 Type 2 diabetes mellitus without complications: Secondary | ICD-10-CM | POA: Diagnosis not present

## 2017-06-16 DIAGNOSIS — R0600 Dyspnea, unspecified: Secondary | ICD-10-CM

## 2017-06-16 DIAGNOSIS — R079 Chest pain, unspecified: Secondary | ICD-10-CM | POA: Diagnosis not present

## 2017-06-16 DIAGNOSIS — I10 Essential (primary) hypertension: Secondary | ICD-10-CM | POA: Insufficient documentation

## 2017-06-16 LAB — MYOCARDIAL PERFUSION IMAGING
Estimated workload: 10.1 METS
Exercise duration (min): 8 min
LV dias vol: 101 mL (ref 46–106)
LV sys vol: 32 mL
MPHR: 157 {beats}/min
Peak HR: 144 {beats}/min
Percent HR: 92 %
RATE: 0.28
RPE: 18
Rest HR: 76 {beats}/min
SDS: 0
SRS: 1
SSS: 1
TID: 1.11

## 2017-06-16 MED ORDER — TECHNETIUM TC 99M TETROFOSMIN IV KIT
10.2000 | PACK | Freq: Once | INTRAVENOUS | Status: AC | PRN
  Administered 2017-06-16: 10.2 via INTRAVENOUS
  Filled 2017-06-16: qty 11

## 2017-06-16 MED ORDER — TECHNETIUM TC 99M TETROFOSMIN IV KIT
31.6000 | PACK | Freq: Once | INTRAVENOUS | Status: AC | PRN
  Administered 2017-06-16: 31.6 via INTRAVENOUS
  Filled 2017-06-16: qty 32

## 2017-06-17 ENCOUNTER — Telehealth: Payer: Self-pay | Admitting: Neurology

## 2017-06-17 ENCOUNTER — Ambulatory Visit: Payer: 59 | Admitting: Family Medicine

## 2017-06-17 ENCOUNTER — Encounter: Payer: Self-pay | Admitting: Family Medicine

## 2017-06-17 ENCOUNTER — Telehealth: Payer: Self-pay | Admitting: Cardiovascular Disease

## 2017-06-17 VITALS — BP 139/84 | HR 75 | Temp 98.1°F | Resp 20 | Ht 66.0 in | Wt 192.0 lb

## 2017-06-17 DIAGNOSIS — E039 Hypothyroidism, unspecified: Secondary | ICD-10-CM

## 2017-06-17 DIAGNOSIS — I10 Essential (primary) hypertension: Secondary | ICD-10-CM | POA: Diagnosis not present

## 2017-06-17 DIAGNOSIS — Z23 Encounter for immunization: Secondary | ICD-10-CM | POA: Diagnosis not present

## 2017-06-17 DIAGNOSIS — E782 Mixed hyperlipidemia: Secondary | ICD-10-CM

## 2017-06-17 DIAGNOSIS — G35 Multiple sclerosis: Secondary | ICD-10-CM

## 2017-06-17 DIAGNOSIS — E119 Type 2 diabetes mellitus without complications: Secondary | ICD-10-CM | POA: Diagnosis not present

## 2017-06-17 DIAGNOSIS — I25118 Atherosclerotic heart disease of native coronary artery with other forms of angina pectoris: Secondary | ICD-10-CM

## 2017-06-17 DIAGNOSIS — R413 Other amnesia: Secondary | ICD-10-CM | POA: Diagnosis not present

## 2017-06-17 DIAGNOSIS — Z7902 Long term (current) use of antithrombotics/antiplatelets: Secondary | ICD-10-CM

## 2017-06-17 DIAGNOSIS — S065XAA Traumatic subdural hemorrhage with loss of consciousness status unknown, initial encounter: Secondary | ICD-10-CM

## 2017-06-17 DIAGNOSIS — S065X9A Traumatic subdural hemorrhage with loss of consciousness of unspecified duration, initial encounter: Secondary | ICD-10-CM | POA: Diagnosis not present

## 2017-06-17 LAB — POCT GLYCOSYLATED HEMOGLOBIN (HGB A1C): Hemoglobin A1C: 6.4

## 2017-06-17 MED ORDER — VENLAFAXINE HCL ER 150 MG PO CP24: 150.0000 mg | ORAL_CAPSULE | Freq: Every day | ORAL | 0 refills | Status: DC

## 2017-06-17 MED ORDER — GLIMEPIRIDE 2 MG PO TABS: ORAL_TABLET | ORAL | 1 refills | Status: DC

## 2017-06-17 MED ORDER — DONEPEZIL HCL 5 MG PO TABS: ORAL_TABLET | ORAL | 0 refills | Status: DC

## 2017-06-17 MED ORDER — HYDROCHLOROTHIAZIDE 25 MG PO TABS
25.0000 mg | ORAL_TABLET | Freq: Every day | ORAL | 0 refills | Status: DC
Start: 2017-06-17 — End: 2017-10-17

## 2017-06-17 MED ORDER — LEVOTHYROXINE SODIUM 100 MCG PO TABS: 100.0000 ug | ORAL_TABLET | Freq: Every day | ORAL | 3 refills | Status: DC

## 2017-06-17 MED ORDER — METFORMIN HCL 1000 MG PO TABS: 1000.0000 mg | ORAL_TABLET | Freq: Two times a day (BID) | ORAL | 5 refills | Status: DC

## 2017-06-17 NOTE — Progress Notes (Signed)
Daisy Bennett , 06-22-1956, 61 y.o., female MRN: [id number] Patient Care Team    Relationship Specialty Notifications Start End  Ma Hillock, DO PCP - General Family Medicine  03/10/15   Devra Dopp, MD Referring Physician Dermatology  02/10/16   Kathrynn Ducking, MD Consulting Physician Neurology  05/18/16   Linda Hedges, [place] Physician Obstetrics and Gynecology  06/04/16   Anell Barr, OD  Optometry  08/17/16     Chief Complaint  Patient presents with  . Diabetes  . Hypertension     Subjective:  Diabetes/BMI30: Patient presents for routine scheduled diabetes follow-up. Last A1c was  6.0.  Today she reports compliance with Amaryl  2 mg/1mg  daily and metformin 1000 mg BID and she is tolerating them. She is still  not checking her sugars. Patient denies dizziness, hyperglycemic or hypoglycemic events. Patient denies numbness, tingling in the extremities or nonhealing wounds of feet.    She is on gabapentin 600 mg BID. - POCT HgB A1C-->  6.5 --> 7.2--> 6.1 --> 6.0--> 6.4 today - PSV 23 given 10/2015, Prevnar today 10/2016 - foot exam 11/15/2016 - Eye exam 2/26//2018; Dr. Venetia Constable - Microalbumin: on an ARB - flu shot UTD 05/12/2016  Hypertension/hyperlipidemia: Today She reports compliance with her medication regimen of Norvasc 10 mg, Coreg 6.25mg  BID, HCTZ 25 mg QD, Losartan/HCTZ 100-25. H/O CAD, NSTEMI, post stent 2011 (OM)-collateralized RCA.  Pt is on BB, statin (low dose), ACE, fenofibrate, ASA 325. She follows with Dr. Johnsie Cancel. Patient denies chest pain, shortness of breath, dizziness or lower extremity edema.   Memory loss/dementia/s/p subdural hematoma/depression:  Pt reports she is experiencing increased memory loss. Her family has showed concern and states she routinely forgets what she has said or done, as well as forgets to complete task. She is having difficulty at work and thinks she is in jeopardy of losing her job. She has a neurologist,  and has seemed important issue. He was felt that it was likely from her multiple sclerosis medications. However since her trauma with subdural hematoma she has steadily declined in her memory loss. She has an upcoming appointment with her neurologist in 3-4 weeks. She also saw a neuropsychiatrist at [facility] for this issue, however it was just before she had fallen, and things have progressed quite significantly since that time. Patient reports she is scared, because she loses her job she will not have any benefits/health insurance and financial strain would be significant. She feels she is more depressed secondary to all these changes. She does have a lot of happiness in her new grandchild was recently born.  Depression screen [facility] 2/9 06/17/2017 06/04/2016  Decreased Interest 0 0  Down, Depressed, Hopeless 0 0  PHQ - 2 Score 0 0    Allergies  Allergen Reactions  . Betaseron [Interferon Beta-1b] Other (See Comments)    Increased LFT's  . Topamax [Topiramate]     Cognitive slowing  . Doxycycline Other (See Comments)    Thrush,dizziness   Social History   Tobacco Use  . Smoking status: Former Smoker    Packs/day: 1.00    Last attempt to quit: 12/16/2015    Years since quitting: 1.5  . Smokeless tobacco: Never Used  Substance Use Topics  . Alcohol use: Yes    Alcohol/week: 0.0 oz    Comment: OCCASIONAL ALCOHOL USE.   Past Medical History:  Diagnosis Date  . Basal cell carcinoma   . Cancer ([place])    basal  and squamous cell skin cancer  . DM (diabetes mellitus) ([place])   . Goiter, unspecified   . History of colonic polyps 2012  . Multiple sclerosis ([place])   . Non-ST elevated myocardial infarction (non-STEMI) ([place])   . SCCA (squamous cell carcinoma) of skin    right shin  . SEMI (subendocardial myocardial infarction) ([place]) 09/29/09   TOTAL RCA WITH STENT TO OM BRANCH  . Shingles   . Squamous cell carcinoma   . Tobacco abuse   . Unspecified essential hypertension   . Unspecified  hypothyroidism    Past Surgical History:  Procedure Laterality Date  . CATARACT SURGERY  12/10  . CESAREAN SECTION    . CORONARY ANGIOPLASTY WITH STENT PLACEMENT    . TONSILLECTOMY    . VESICOVAGINAL FISTULA CLOSURE W/ TAH     Family History  Problem Relation Age of Onset  . Hypertension Mother   . Heart failure Father   . Aneurysm Brother        THORACIC/ABD ANEURYSM  . Prostate cancer Brother 19  . Fibromyalgia Sister   . Brain cancer Maternal Grandmother 1  . Multiple sclerosis Neg Hx    Allergies as of 06/17/2017      Reactions   Betaseron [interferon Beta-1b] Other (See Comments)   Increased LFT's   Topamax [topiramate]    Cognitive slowing   Doxycycline Other (See Comments)   Thrush,dizziness      Medication List        Accurate as of 06/17/17  8:59 AM. Always use your most recent med list.          amitriptyline 50 MG tablet Commonly known as:  ELAVIL Take 1 tablet (50 mg total) by mouth daily.   amLODipine 10 MG tablet Commonly known as:  NORVASC Take 1 tablet (10 mg total) by mouth daily.   aspirin EC 81 MG tablet Take 1 tablet (81 mg total) daily by mouth.   carvedilol 6.25 MG tablet Commonly known as:  COREG Take 1 tablet (6.25 mg total) by mouth 2 (two) times daily with a meal.   cholecalciferol 1000 units tablet Commonly known as:  VITAMIN D Take 1,000 Units by mouth daily.   CINNAMON PO Take 2,000 mg by mouth daily.   esomeprazole 20 MG capsule Commonly known as:  NEXIUM Take 20 mg by mouth daily at 12 noon.   fenofibrate 160 MG tablet Take 1 tablet (160 mg total) by mouth daily.   fish oil-omega-3 fatty acids 1000 MG capsule Take 1 g by mouth daily.   gabapentin 600 MG tablet Commonly known as:  NEURONTIN Take 1 tablet (600 mg total) by mouth 2 (two) times daily.   glimepiride 2 MG tablet Commonly known as:  AMARYL 1 tab in the morning and  1/2 tab in the evening.   glucose blood test strip DX:E11.9, monitor fasting BG  daily.   hydrochlorothiazide 25 MG tablet Commonly known as:  HYDRODIURIL Take 1 tablet (25 mg total) daily by mouth.   Iron 325 (65 Fe) MG Tabs Take 325 mg by mouth daily.   levothyroxine 100 MCG tablet Commonly known as:  SYNTHROID, LEVOTHROID Take 1 tablet (100 mcg total) by mouth daily.   losartan-hydrochlorothiazide 100-25 MG tablet Commonly known as:  HYZAAR TAKE 1 TABLET BY MOUTH DAILY.   metFORMIN 1000 MG tablet Commonly known as:  GLUCOPHAGE Take 1 tablet (1,000 mg total) by mouth 2 (two) times daily with a meal.   MULTIVITAMINS PO Take 1 tablet by  mouth at bedtime.   nitroGLYCERIN 0.4 MG SL tablet Commonly known as:  NITROSTAT Place 1 tablet (0.4 mg total) under the tongue every 5 (five) minutes x 3 doses as needed for chest pain.   prasugrel 10 MG Tabs tablet Commonly known as:  EFFIENT TAKE 1 TABLET BY MOUTH DAILY   rosuvastatin 10 MG tablet Commonly known as:  CRESTOR TAKE 1 TABLET (10 MG TOTAL) BY MOUTH DAILY.   TECFIDERA 240 MG Cpdr Generic drug:  Dimethyl Fumarate TAKE 1 CAPSULE TWICE DAILY   venlafaxine XR 75 MG 24 hr capsule Commonly known as:  EFFEXOR-XR Take 75 mg by mouth daily.       All past medical history, surgical history, allergies, family history, immunizations andmedications were updated in the EMR today and reviewed under the history and medication portions of their EMR.     ROS: Negative, with the exception of above mentioned in HPI   Objective:  BP 139/84 (BP Location: Left Arm, Patient Position: Sitting, Cuff Size: Large)   Pulse 75   Temp 98.1 F (36.7 C)   Resp 20   Ht 5\' 6"  (1.676 m)   Wt 192 lb (87.1 kg)   SpO2 98%   BMI 30.99 kg/m  Body mass index is 30.99 kg/m. Gen: Afebrile. No acute distress. Nontoxic in appearance, well developed, well nourished.  HENT: AT. No Name. MMM, no oral lesions. Eyes:Pupils Equal Round Reactive to light, Extraocular movements intact,  Conjunctiva without redness, discharge or  icterus. CV: RRR 1/6 SM, no edema Chest: CTAB, no wheeze or crackles. Good air movement, normal resp effort.  Abd: Soft. NTND. BS present.   Neuro:  Normal gait. PERLA. EOMi. Alert. Oriented x3  Psych: Sad, tearful today. Otherwise, Normal affect, dress and demeanor. Normal speech. Normal thought content and judgment.  No exam data present No results found. No results found for this or any previous visit (from the past 24 hour(s)).  Assessment/Plan: KARSON CHICAS is a 61 y.o. female present for OV for  Diabetes mellitus without complication (HCC)/Morbid obesity - POCT HgB A1C:  6.5--> 7.2--> 6.1--> 6.0--> 6.4  today .  - continue  Amaryl to 2 mg morning, 1 mg at night. Refills provided today. - continue metformin 1000 mg BID, refills provided.  - increase exercise regimen and continue dietary modification.  - PSV 23 given 10/2015, Prevnar completed 11/15/2016 - foot exam 11/15/2016 - Eye exam 09/2016, Dr. Venetia Constable. Records requested - Microalbumin: on an ARB - flu shot UTD today - f/u 4 months  Hypertension/hyperlipidemia/Antiplatelet or antithrombotic long-term use/athersclerosis:  -  stable but borderline today. Patient is upset which may be causing her increase. - follows with cardiology, Dr. Johnsie Cancel who prescribed medications.  - low salt, increase exercise.  - Continue to monitor at home if above 140/90 will need to see sooner and discuss.  - Continue fenofibrate --> recheck due 06/2017, patient is not fasting today. She will schedule a fasting lab in 1-2 weeks. - continue statin.  - continue Effient - maintain routine cardiology follow ups.   Memory loss /depression/hypothyroid/subdural hematoma/MS:  - has neuro appt in 4 weeks, will start aricpet 5 mg. if neurology feels agreeable to this medication, they can increase her appointment or she can follow-up here for this. - will check TSH level with upcoming labs as well.  - increased effexor to 150 mg. hopefully this  will give her the cover she needs for her attitude depression. However we will need to monitor her blood  pressure to make sure it does not increase with Effexor higher dose use. - f/u 4 weeks  - flu shot administered today.    Reviewed expectations re: course of current medical issues.  Discussed self-management of symptoms.  Outlined signs and symptoms indicating need for more acute intervention.  Patient verbalized understanding and all questions were answered.  Patient received an After-Visit Summary.    Orders Placed This Encounter  Procedures  . POCT glycosylated hemoglobin (Hb A1C)    Greater than 40 minutes spent with patient, >50% of time spent face to face counseling    Note is dictated utilizing voice recognition software. Although note has been proof read prior to signing, occasional typographical errors still can be missed. If any questions arise, please do not hesitate to call for verification.   electronically signed by:  Howard Pouch, DO  [place]

## 2017-06-17 NOTE — Telephone Encounter (Signed)
Follow Up: ° ° ° °Returning your call from this morning. °

## 2017-06-17 NOTE — Telephone Encounter (Signed)
error 

## 2017-06-17 NOTE — Telephone Encounter (Signed)
Patient aware of results. Per Dr. Johnsie Cancel, Normal myovue study with no evidence of ischemia or infarction . Patient verbalized understanding.

## 2017-06-17 NOTE — Patient Instructions (Addendum)
Please have fasting labs drawn in the next 2 weeks to recheck cholesterol.  Your a1c looks ok today.   I have refilled your medications.   Increase effexor to 150 mg a day (take 2 of the pills you have at home until you get my new script which is the 150 mg dose).   Start aricept 5 mg a day, follow up 4 weeks. You can discuss this with neurology as well. Call them and let them know you are having issues with your memory since the fall.    Follow up 4 months.    Please help [place] help you:  We are honored you have chosen [facility] for your Primary Care home. Below you will find basic instructions that you may need to access in the future. Please help [place] help you by reading the instructions, which cover many of the frequent questions we experience.   Prescription refills and request:  -In order to allow more efficient response time, please call your pharmacy for all refills. They will forward the request electronically to [place]. This allows for the quickest possible response. Request left on a nurse line can take longer to refill, since these are checked as time allows between office patients and other phone calls.  - refill request can take up to 3-5 working days to complete.  - If request is sent electronically and request is appropiate, it is usually completed in 1-2 business days.  - all patients will need to be seen routinely for all chronic medical conditions requiring prescription medications (see follow-up below). If you are overdue for follow up on your condition, you will be asked to make an appointment and we will call in enough medication to cover you until your appointment (up to 30 days).  - all controlled substances will require a face to face visit to request/refill.  - if you desire your prescriptions to go through a new pharmacy, and have an active script at original pharmacy, you will need to call your pharmacy and have scripts transferred to new pharmacy. This is completed  between the pharmacy locations and not by your provider.    Results: If any images or labs were ordered, it can take up to 1 week to get results depending on the test ordered and the lab/facility running and resulting the test. - Normal or stable results, which do not need further discussion, may be released to your mychart immediately with attached note to you. A call may not be generated for normal results. Please make certain to sign up for mychart. If you have questions on how to activate your mychart you can call the front office.  - If your results need further discussion, our office will attempt to contact you via phone, and if unable to reach you after 2 attempts, we will release your abnormal result to your mychart with instructions.  - All results will be automatically released in mychart after 1 week.  - Your provider will provide you with explanation and instruction on all relevant material in your results. Please keep in mind, results and labs may appear confusing or abnormal to the untrained eye, but it does not mean they are actually abnormal for you personally. If you have any questions about your results that are not covered, or you desire more detailed explanation than what was provided, you should make an appointment with your provider to do so.   Our office handles many outgoing and incoming calls daily. If we have  not contacted you within 1 week about your results, please check your mychart to see if there is a message first and if not, then contact our office.  In helping with this matter, you help decrease call volume, and therefore allow [place] to be able to respond to patients needs more efficiently.   Acute office visits (sick visit):  An acute visit is intended for a new problem and are scheduled in shorter time slots to allow schedule openings for patients with new problems. This is the appropriate visit to discuss a new problem. In order to provide you with excellent quality  medical care with proper time for you to explain your problem, have an exam and receive treatment with instructions, these appointments should be limited to one new problem per visit. If you experience a new problem, in which you desire to be addressed, please make an acute office visit, we save openings on the schedule to accommodate you. Please do not save your new problem for any other type of visit, let us take care of it properly and quickly for you.   Follow up visits:  Depending on your condition(s) your provider will need to see you routinely in order to provide you with quality care and prescribe medication(s). Most chronic conditions (Example: hypertension, Diabetes, depression/anxiety... etc), require visits a couple times a year. Your provider will instruct you on proper follow up for your personal medical conditions and history. Please make certain to make follow up appointments for your condition as instructed. Failing to do so could result in lapse in your medication treatment/refills. If you request a refill, and are overdue to be seen on a condition, we will always provide you with a 30 day script (once) to allow you time to schedule.    Medicare wellness (well visit): - we have a wonderful Nurse Maudie Mercury), that will meet with you and provide you will yearly medicare wellness visits. These visits should occur yearly (can not be scheduled less than 1 calendar year apart) and cover preventive health, immunizations, advance directives and screenings you are entitled to yearly through your medicare benefits. Do not miss out on your entitled benefits, this is when medicare will pay for these benefits to be ordered for you.  These are strongly encouraged by your provider and is the appropriate type of visit to make certain you are up to date with all preventive health benefits. If you have not had your medicare wellness exam in the last 12 months, please make certain to schedule one by calling the  office and schedule your medicare wellness with Maudie Mercury as soon as possible.   Yearly physical (well visit):  - Adults are recommended to be seen yearly for physicals. Check with your insurance and date of your last physical, most insurances require one calendar year between physicals. Physicals include all preventive health topics, screenings, medical exam and labs that are appropriate for gender/age and history. You may have fasting labs needed at this visit. This is a well visit (not a sick visit), new problems should not be covered during this visit (see acute visit).  - Pediatric patients are seen more frequently when they are younger. Your provider will advise you on well child visit timing that is appropriate for your their age. - This is not a medicare wellness visit. Medicare wellness exams do not have an exam portion to the visit. Some medicare companies allow for a physical, some do not allow a yearly physical. If your medicare allows a  yearly physical you can schedule the medicare wellness with our nurse Maudie Mercury and have your physical with your provider after, on the same day. Please check with insurance for your full benefits.   Late Policy/No Shows:  - all new patients should arrive 15-30 minutes earlier than appointment to allow [place] time  to  obtain all personal demographics,  insurance information and for you to complete office paperwork. - All established patients should arrive 10-15 minutes earlier than appointment time to update all information and be checked in .  - In our best efforts to run on time, if you are late for your appointment you will be asked to either reschedule or if able, we will work you back into the schedule. There will be a wait time to work you back in the schedule,  depending on availability.  - If you are unable to make it to your appointment as scheduled, please call 24 hours ahead of time to allow [place] to fill the time slot with someone else who needs to be seen. If you  do not cancel your appointment ahead of time, you may be charged a no show fee.

## 2017-06-21 ENCOUNTER — Other Ambulatory Visit (INDEPENDENT_AMBULATORY_CARE_PROVIDER_SITE_OTHER): Payer: 59

## 2017-06-21 DIAGNOSIS — E782 Mixed hyperlipidemia: Secondary | ICD-10-CM

## 2017-06-21 DIAGNOSIS — E039 Hypothyroidism, unspecified: Secondary | ICD-10-CM

## 2017-06-21 DIAGNOSIS — R413 Other amnesia: Secondary | ICD-10-CM

## 2017-06-21 DIAGNOSIS — I10 Essential (primary) hypertension: Secondary | ICD-10-CM

## 2017-06-21 LAB — BASIC METABOLIC PANEL
BUN: 18 mg/dL (ref 6–23)
CO2: 29 mEq/L (ref 19–32)
Calcium: 10.4 mg/dL (ref 8.4–10.5)
Chloride: 100 mEq/L (ref 96–112)
Creatinine, Ser: 0.9 mg/dL (ref 0.40–1.20)
GFR: 67.2 mL/min (ref >60.00)
Glucose, Bld: 143 mg/dL — ABNORMAL HIGH (ref 70–99)
Potassium: 3.1 mEq/L — ABNORMAL LOW (ref 3.5–5.1)
Sodium: 141 mEq/L (ref 135–145)

## 2017-06-21 LAB — LIPID PANEL
Cholesterol: 146 mg/dL (ref 0–200)
HDL: 55.5 mg/dL (ref >39.00)
LDL Cholesterol: 51 mg/dL (ref 0–99)
NonHDL: 90.39
Total CHOL/HDL Ratio: 3
Triglycerides: 198 mg/dL — ABNORMAL HIGH (ref 0.0–149.0)
VLDL: 39.6 mg/dL (ref 0.0–40.0)

## 2017-06-21 LAB — TSH: TSH: 3.6 u[IU]/mL (ref 0.35–4.50)

## 2017-06-22 ENCOUNTER — Telehealth: Payer: Self-pay | Admitting: Family Medicine

## 2017-06-22 ENCOUNTER — Encounter: Payer: Self-pay | Admitting: Adult Health

## 2017-06-22 ENCOUNTER — Ambulatory Visit: Payer: 59 | Admitting: Adult Health

## 2017-06-22 VITALS — BP 158/76 | HR 72 | Ht 66.0 in | Wt 190.5 lb

## 2017-06-22 DIAGNOSIS — Z5181 Encounter for therapeutic drug level monitoring: Secondary | ICD-10-CM | POA: Diagnosis not present

## 2017-06-22 DIAGNOSIS — R413 Other amnesia: Secondary | ICD-10-CM

## 2017-06-22 DIAGNOSIS — G35 Multiple sclerosis: Secondary | ICD-10-CM | POA: Diagnosis not present

## 2017-06-22 NOTE — Patient Instructions (Addendum)
Your Plan:  Continue Tecfidera Blood work today Repeat memory testing  If your symptoms worsen or you develop new symptoms please let us know.    Thank you for coming to see [place] at [facility]. I hope we have been able to provide you high quality care today.  You may receive a patient satisfaction survey over the next few weeks. We would appreciate your feedback and comments so that we may continue to improve ourselves and the health of our patients.

## 2017-06-22 NOTE — Telephone Encounter (Signed)
Spoke with patient reviewed lab results and instructions. Patient verbalized understanding. 

## 2017-06-22 NOTE — Progress Notes (Signed)
I have read the note, and I agree with the clinical assessment and plan.  Maizey Menendez K Vandell Kun   

## 2017-06-22 NOTE — Telephone Encounter (Signed)
Please call pt: - her potassium is a little low. I have called in a supplement for her to take daily for 1 week. I would encourage her to incorporate more potassium rich foods in her diet as well. This could be secondary to her diuretic use.  - Her triglycerides are also a little elevated. Recommend she increase her fish oil to 3000mg  daily.  - all other labs normal.

## 2017-06-22 NOTE — Progress Notes (Signed)
PATIENT: Daisy Bennett DOB: 15-Mar-1956  REASON FOR VISIT: follow up-possible sclerosis, memory disturbance HISTORY FROM: patient  HISTORY OF PRESENT ILLNESS: Today 06/22/17 Daisy Bennett is a 61 year old female with a history of multiple sclerosis and memory disturbance.  She returns today for follow-up.  She continues on Tecfidera and tolerates it well.  She denies any new numbness or weakness.  Denies any changes with her gait or balance.  Denies any trouble with her vision.  Denies any changes with bowels or bladder.  She reports that after her fall and subdural hematoma her memory has progressively gotten worse.  She states that she is not performing well at her job.  Reports that she supposed to do continuing education classes but is unable to retain the information so she has not been doing the mandatory education.  Therefore she states that she is in jeopardy of losing her job at the end of the year.  She is able to complete all ADLs independently.  She operates a Teacher, music without difficulty.  Denies any trouble sleeping.  Reports that she takes amitriptyline to help with her sleep.  Reports good appetite.  Returns today for an evaluation.  HISTORY 10/12/16: Daisy Bennett is a 61 year old right-handed white female with a history of multiple sclerosis. The patient has undergone recent MRI evaluation of the brain in September 2017 that showed mild changes associated with multiple sclerosis. In October 2017 the patient sustained a fall and developed a small subdural hematoma. This resolved spontaneously without surgical intervention. The patient underwent formal neuropsychological evaluation prior to the head injury as she was reporting some problems with memory. This did not show evidence of an organic brain syndrome, it was felt that medication effect and possible mild depression could be an issue. The patient returns to this office for further evaluation. She continues to work, she does report  some troubles with memory, but she has taken better notes to help her through the day. The patient reports no new numbness, weakness, balance changes, or troubles with vision or bowel or bladder control. She is on Tecfidera, she is tolerating this drug well.   REVIEW OF SYSTEMS: Out of a complete 14 system review of symptoms, the patient complains only of the following symptoms, and all other reviewed systems are negative.  ALLERGIES: Allergies  Allergen Reactions  . Betaseron [Interferon Beta-1b] Other (See Comments)    Increased LFT's  . Topamax [Topiramate]     Cognitive slowing  . Doxycycline Other (See Comments)    Thrush,dizziness    HOME MEDICATIONS: Outpatient Medications Prior to Visit  Medication Sig Dispense Refill  . amitriptyline (ELAVIL) 50 MG tablet Take 1 tablet (50 mg total) by mouth daily. 90 tablet 3  . amLODipine (NORVASC) 10 MG tablet Take 1 tablet (10 mg total) by mouth daily. 90 tablet 3  . aspirin EC 81 MG tablet Take 1 tablet (81 mg total) daily by mouth.    . carvedilol (COREG) 6.25 MG tablet Take 1 tablet (6.25 mg total) by mouth 2 (two) times daily with a meal. 180 tablet 3  . cholecalciferol (VITAMIN D) 1000 units tablet Take 1,000 Units by mouth daily.    [name] CINNAMON PO Take 2,000 mg by mouth daily.     [name] donepezil (ARICEPT) 5 MG tablet 5 mg QD 30 tablet 0  . esomeprazole (NEXIUM) 20 MG capsule Take 20 mg by mouth daily at 12 noon.    . fenofibrate 160 MG tablet Take 1 tablet (  160 mg total) by mouth daily. 90 tablet 3  . Ferrous Sulfate (IRON) 325 (65 FE) MG TABS Take 325 mg by mouth daily.     . fish oil-omega-3 fatty acids 1000 MG capsule Take 1 g by mouth daily.     [name] gabapentin (NEURONTIN) 600 MG tablet Take 1 tablet (600 mg total) by mouth 2 (two) times daily. 180 tablet 3  . glimepiride (AMARYL) 2 MG tablet 1 tab in the morning and  1/2 tab in the evening. 135 tablet 1  . glucose blood test strip DX:E11.9, monitor fasting BG daily. 100 each 11  .  hydrochlorothiazide (HYDRODIURIL) 25 MG tablet Take 1 tablet (25 mg total) daily by mouth. 90 tablet 0  . levothyroxine (SYNTHROID, LEVOTHROID) 100 MCG tablet Take 1 tablet (100 mcg total) daily by mouth. 90 tablet 3  . losartan-hydrochlorothiazide (HYZAAR) 100-25 MG tablet TAKE 1 TABLET BY MOUTH DAILY. 90 tablet 2  . metFORMIN (GLUCOPHAGE) 1000 MG tablet Take 1 tablet (1,000 mg total) 2 (two) times daily with a meal by mouth. 180 tablet 5  . Multiple Vitamin (MULTIVITAMINS PO) Take 1 tablet by mouth at bedtime.     . nitroGLYCERIN (NITROSTAT) 0.4 MG SL tablet Place 1 tablet (0.4 mg total) under the tongue every 5 (five) minutes x 3 doses as needed for chest pain. 25 tablet 4  . prasugrel (EFFIENT) 10 MG TABS tablet TAKE 1 TABLET BY MOUTH DAILY 90 tablet 3  . rosuvastatin (CRESTOR) 10 MG tablet TAKE 1 TABLET (10 MG TOTAL) BY MOUTH DAILY. 90 tablet 0  . TECFIDERA 240 MG CPDR TAKE 1 CAPSULE TWICE DAILY 60 capsule 11  . venlafaxine XR (EFFEXOR-XR) 150 MG 24 hr capsule Take 1 capsule (150 mg total) daily by mouth. 30 capsule 0   Facility-Administered Medications Prior to Visit  Medication Dose Route Frequency Provider Last Rate Last Dose  . gadopentetate dimeglumine (MAGNEVIST) injection 18 mL  18 mL Intravenous Once PRN Kathrynn Ducking, MD        PAST MEDICAL HISTORY: Past Medical History:  Diagnosis Date  . Basal cell carcinoma   . Cancer ([place])    basal and squamous cell skin cancer  . DM (diabetes mellitus) ([place])   . Goiter, unspecified   . History of colonic polyps 2012  . Multiple sclerosis ([place])   . Non-ST elevated myocardial infarction (non-STEMI) ([place])   . SCCA (squamous cell carcinoma) of skin    right shin  . SEMI (subendocardial myocardial infarction) ([place]) 09/29/09   TOTAL RCA WITH STENT TO OM BRANCH  . Shingles   . Squamous cell carcinoma   . Tobacco abuse   . Unspecified essential hypertension   . Unspecified hypothyroidism     PAST SURGICAL HISTORY: Past Surgical  History:  Procedure Laterality Date  . CATARACT SURGERY  12/10  . CESAREAN SECTION    . CORONARY ANGIOPLASTY WITH STENT PLACEMENT    . TONSILLECTOMY    . VESICOVAGINAL FISTULA CLOSURE W/ TAH      FAMILY HISTORY: Family History  Problem Relation Age of Onset  . Hypertension Mother   . Heart failure Father   . Aneurysm Brother        THORACIC/ABD ANEURYSM  . Prostate cancer Brother 6  . Fibromyalgia Sister   . Brain cancer Maternal Grandmother 5  . Multiple sclerosis Neg Hx     SOCIAL HISTORY: Social History   Socioeconomic History  . Marital status: Married    Spouse name: Not on file  .  Number of children: 2  . Years of education: 70  . Highest education level: Not on file  Social Needs  . Financial resource strain: Not on file  . Food insecurity - worry: Not on file  . Food insecurity - inability: Not on file  . Transportation needs - medical: Not on file  . Transportation needs - non-medical: Not on file  Occupational History  . Occupation: Therapist, music  Tobacco Use  . Smoking status: Former Smoker    Packs/day: 1.00    Last attempt to quit: 12/16/2015    Years since quitting: 1.5  . Smokeless tobacco: Never Used  Substance and Sexual Activity  . Alcohol use: Yes    Alcohol/week: 0.0 oz    Comment: OCCASIONAL ALCOHOL USE.  . Drug use: No  . Sexual activity: Yes  Other Topics Concern  . Not on file  Social History Narrative   The patient lives with husband. 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    Patient drinks occasionally, does not chew tobacco, does not use recreational drugs. She does drink occasional caffeine. She does wear her seatbelt. She does wear bike helmet riding a bike. She does exercise 3 times a week. She does not wear hearing aids or dentures. There is a smoke alarm in her home. There are no firearms in her home. She feels safe in her relationship. She has never experienced physical abuse.   Patient  sleeps 7-8 hours a night.   Patient drinks 2 cups of caffeine daily.   Patient is right handed.      PHYSICAL EXAM  Vitals:   06/22/17 0928  BP: (!) 158/76  Pulse: 72  Weight: 190 lb 8 oz (86.4 kg)  Height: 5\' 6"  (1.676 m)   Body mass index is 30.75 kg/m.   MMSE - Mini Mental State Exam 06/22/2017 10/12/2016 03/10/2016  Orientation to time 4 5 5   Orientation to Place 5 5 5   Registration 3 3 3   Attention/ Calculation 5 5 5   Recall 2 2 2   Language- name 2 objects 2 2 2   Language- repeat 1 1 1   Language- follow 3 step command 3 3 3   Language- read & follow direction 1 1 1   Write a sentence 1 1 1   Copy design 1 1 1   Total score 28 29 29      Generalized: Well developed, in no acute distress   Neurological examination  Mentation: Alert oriented to time, place, history taking. Follows all commands speech and language fluent Cranial nerve II-XII: Pupils were equal round reactive to light. Extraocular movements were full, visual field were full on confrontational test. Facial sensation and strength were normal. Uvula tongue midline. Head turning and shoulder shrug  were normal and symmetric. Motor: The motor testing reveals 5 over 5 strength of all 4 extremities. Good symmetric motor tone is noted throughout.  Sensory: Sensory testing is intact to soft touch on all 4 extremities. No evidence of extinction is noted.  Coordination: Cerebellar testing reveals good finger-nose-finger and heel-to-shin bilaterally.  Gait and station: Gait is normal. Tandem gait is normal. Romberg is negative. No drift is seen.  Reflexes: Deep tendon reflexes are symmetric and normal bilaterally.   DIAGNOSTIC DATA (LABS, IMAGING, TESTING) - I reviewed patient records, labs, notes, testing and imaging myself where available.  Lab Results  Component Value Date   WBC 7.1 10/12/2016   HGB 12.3 10/12/2016   HCT 36.3 10/12/2016   MCV 93  10/12/2016   PLT 220 10/12/2016      Component Value Date/Time     NA 141 06/21/2017 0817   NA 139 10/12/2016 1552   K 3.1 (L) 06/21/2017 0817   CL 100 06/21/2017 0817   CO2 29 06/21/2017 0817   GLUCOSE 143 (H) 06/21/2017 0817   BUN 18 06/21/2017 0817   BUN 16 10/12/2016 1552   CREATININE 0.90 06/21/2017 0817   CALCIUM 10.4 06/21/2017 0817   PROT 7.2 10/12/2016 1552   ALBUMIN 4.5 10/12/2016 1552   AST 18 10/12/2016 1552   ALT 26 10/12/2016 1552   ALKPHOS 59 10/12/2016 1552   BILITOT 0.3 10/12/2016 1552   GFRNONAA 94 10/12/2016 1552   GFRAA 108 10/12/2016 1552   Lab Results  Component Value Date   CHOL 146 06/21/2017   HDL 55.50 06/21/2017   LDLCALC 51 06/21/2017   TRIG 198.0 (H) 06/21/2017   CHOLHDL 3 06/21/2017   Lab Results  Component Value Date   HGBA1C 6.4 06/17/2017   Lab Results  Component Value Date   VITAMINB12 682 07/22/2015   Lab Results  Component Value Date   TSH 3.60 06/21/2017      ASSESSMENT AND PLAN 61 y.o. year old female  has a past medical history of Basal cell carcinoma, Cancer ([place]), DM (diabetes mellitus) ([place]), Goiter, unspecified, History of colonic polyps (2012), Multiple sclerosis ([place]), Non-ST elevated myocardial infarction (non-STEMI) ([place]), SCCA (squamous cell carcinoma) of skin, SEMI (subendocardial myocardial infarction) ([place]) (09/29/09), Shingles, Squamous cell carcinoma, Tobacco abuse, Unspecified essential hypertension, and Unspecified hypothyroidism. here with :  1.  Multiple sclerosis 2.  Memory disturbance    She will continue on Tecfidera.  Memory score has remained stable despite her report of subjective memory changes.  I suggest that we could repeat neuropsychological testing since the first test was completed before her fall.  She is amenable to this plan.  I did advise the patient that there may be other jobs that she could do successfully.  She voiced understanding.  We discussed potentially taking the patient off of amitriptyline and using a different sleep aid such as trazodone.  However  the patient states that she has been on this medication for 15 years and does not want to change it at this time.  She is advised that if her symptoms worsen or she develops new symptoms she should let us know.  She will follow-up in 6 months or sooner if needed.     Ward Givens, MSN, NP-C 06/22/2017, 9:23 AM [facility] [address] [place], [place] [address] [phone]

## 2017-06-23 LAB — CBC WITH DIFFERENTIAL/PLATELET
Basophils Absolute: 0 10*3/uL (ref 0.0–0.2)
Basos: 0 %
EOS (ABSOLUTE): 0.2 10*3/uL (ref 0.0–0.4)
Eos: 2 %
Hematocrit: 39.5 % (ref 34.0–46.6)
Hemoglobin: 13.3 g/dL (ref 11.1–15.9)
Immature Grans (Abs): 0 10*3/uL (ref 0.0–0.1)
Immature Granulocytes: 0 %
Lymphocytes Absolute: 0.6 10*3/uL — ABNORMAL LOW (ref 0.7–3.1)
Lymphs: 7 %
MCH: 31.7 pg (ref 26.6–33.0)
MCHC: 33.7 g/dL (ref 31.5–35.7)
MCV: 94 fL (ref 79–97)
Monocytes Absolute: 0.7 10*3/uL (ref 0.1–0.9)
Monocytes: 9 %
Neutrophils Absolute: 6.6 10*3/uL (ref 1.4–7.0)
Neutrophils: 82 %
Platelets: 239 10*3/uL (ref 150–379)
RBC: 4.19 x10E6/uL (ref 3.77–5.28)
RDW: 13.5 % (ref 12.3–15.4)
WBC: 8.1 10*3/uL (ref 3.4–10.8)

## 2017-06-24 ENCOUNTER — Other Ambulatory Visit: Payer: Self-pay | Admitting: Cardiovascular Disease

## 2017-06-24 ENCOUNTER — Other Ambulatory Visit: Payer: Self-pay | Admitting: Neurology

## 2017-06-27 ENCOUNTER — Telehealth: Payer: Self-pay | Admitting: *Deleted

## 2017-06-27 NOTE — Telephone Encounter (Signed)
Spoke with patient and informed her that her blood work is relatively unremarkable. She verbalized understanding, appreciation.

## 2017-06-30 ENCOUNTER — Telehealth: Payer: Self-pay | Admitting: *Deleted

## 2017-06-30 NOTE — Telephone Encounter (Signed)
   [facility] Pre-operative Risk Assessment    Request for surgical clearance:  1. What type of surgery is being performed?  Left Knee Arthroscopy   2. When is this surgery scheduled? tbd   3. Are there any medications that need to be held prior to surgery and how long?   4. Practice name and name of physician performing surgery?  [facility] Dr. Esmond Plants   5. What is your office phone and fax number?  Office: [phone] Fax: [phone]       Attn: Santiago Bur   6. Anesthesia type (None, local, MAC, general) ?    Daisy Bennett L 06/30/2017, 11:23 AM  _________________________________________________________________   (provider comments below)

## 2017-07-01 NOTE — Telephone Encounter (Signed)
Called surgery scheduler, Jeronimo Norma, a message to call back with more details re: pts upcoming procedure. Left Margarita Grizzle a detailed message to call back with said details that have been left out of the call.

## 2017-07-01 NOTE — Telephone Encounter (Signed)
    Chart reviewed as part of pre-operative protocol coverage. Intake is incomplete. Please get details to unanswered questions so we can provide complete recommendation.  Charlie Pitter, PA-C  07/01/2017, 1:27 PM

## 2017-07-04 NOTE — Telephone Encounter (Signed)
Ok to hold effient and asa for 5 days for knee arthroscopy

## 2017-07-04 NOTE — Telephone Encounter (Signed)
   Chart reviewed as part of pre-operative protocol coverage. Patient was contacted 07/04/2017 in reference to pre-operative risk assessment for pending surgery as outlined below.  ALICE BURNSIDE was last seen on 06/09/17 by Dr. Johnsie Cancel  Since that day, DAMIRA KEM has done well. Low risk stress test 06/06/17.   Will route to [facility] for antiplatelet therapy review.    [place], [place] 07/04/2017, 3:17 PM

## 2017-07-04 NOTE — Telephone Encounter (Signed)
   [facility] Pre-operative Risk Assessment    Request for surgical clearance:  1. What type of surgery is being performed?  Left Knee Arthroscopy  2. When is this surgery scheduled?  TBD  3. Are there any medications that need to be held prior to surgery and how long?  Requesting permission to stop Effient/ASA   4. Practice name and name of physician performing surgery? [facility],          Dr Daisy Bennett  5. What is your office phone and fax number?  Office [phone]   Fax [phone] Attn: Daisy Bennett  6. Anesthesia type (None, local, MAC, general) ? Local with MAC (1 hr surgery)   Daisy Bennett 07/04/2017, 2:03 PM  _________________________________________________________________   (provider comments below)

## 2017-07-04 NOTE — Telephone Encounter (Signed)
Left message for Daisy Bennett to call back.

## 2017-07-05 NOTE — Telephone Encounter (Signed)
   Chart reviewed as part of pre-operative protocol coverage. Chart already addressed by colleagues.  - Cleared for surgery without further CV testing per Vin Bhagat PA-C - Per Dr. Johnsie Cancel, "Ok to hold effient and asa for 5 days for knee arthroscopy"  Will route this bundled recommendation to requesting provider via Epic fax function. Please call with questions.  Charlie Pitter, PA-C  07/05/2017, 8:05 AM

## 2017-07-06 ENCOUNTER — Ambulatory Visit: Payer: 59 | Admitting: Family Medicine

## 2017-07-06 ENCOUNTER — Other Ambulatory Visit: Payer: Self-pay

## 2017-07-06 ENCOUNTER — Emergency Department (HOSPITAL_BASED_OUTPATIENT_CLINIC_OR_DEPARTMENT_OTHER): Payer: 59

## 2017-07-06 ENCOUNTER — Encounter (HOSPITAL_BASED_OUTPATIENT_CLINIC_OR_DEPARTMENT_OTHER): Payer: Self-pay

## 2017-07-06 ENCOUNTER — Telehealth: Payer: Self-pay

## 2017-07-06 ENCOUNTER — Emergency Department (HOSPITAL_BASED_OUTPATIENT_CLINIC_OR_DEPARTMENT_OTHER)
Admission: EM | Admit: 2017-07-06 | Discharge: 2017-07-06 | Disposition: A | Discharge status: Home / Self Care | Payer: 59 | Attending: Emergency Medicine | Admitting: Emergency Medicine

## 2017-07-06 DIAGNOSIS — Z955 Presence of coronary angioplasty implant and graft: Secondary | ICD-10-CM | POA: Insufficient documentation

## 2017-07-06 DIAGNOSIS — S0083XA Contusion of other part of head, initial encounter: Secondary | ICD-10-CM | POA: Insufficient documentation

## 2017-07-06 DIAGNOSIS — Z87891 Personal history of nicotine dependence: Secondary | ICD-10-CM | POA: Insufficient documentation

## 2017-07-06 DIAGNOSIS — S0990XA Unspecified injury of head, initial encounter: Secondary | ICD-10-CM

## 2017-07-06 DIAGNOSIS — Y999 Unspecified external cause status: Secondary | ICD-10-CM | POA: Insufficient documentation

## 2017-07-06 DIAGNOSIS — Z7982 Long term (current) use of aspirin: Secondary | ICD-10-CM | POA: Diagnosis not present

## 2017-07-06 DIAGNOSIS — E039 Hypothyroidism, unspecified: Secondary | ICD-10-CM | POA: Diagnosis not present

## 2017-07-06 DIAGNOSIS — Z7984 Long term (current) use of oral hypoglycemic drugs: Secondary | ICD-10-CM | POA: Diagnosis not present

## 2017-07-06 DIAGNOSIS — I10 Essential (primary) hypertension: Secondary | ICD-10-CM | POA: Insufficient documentation

## 2017-07-06 DIAGNOSIS — I2510 Atherosclerotic heart disease of native coronary artery without angina pectoris: Secondary | ICD-10-CM | POA: Insufficient documentation

## 2017-07-06 DIAGNOSIS — S098XXA Other specified injuries of head, initial encounter: Secondary | ICD-10-CM | POA: Diagnosis not present

## 2017-07-06 DIAGNOSIS — Y939 Activity, unspecified: Secondary | ICD-10-CM | POA: Insufficient documentation

## 2017-07-06 DIAGNOSIS — E119 Type 2 diabetes mellitus without complications: Secondary | ICD-10-CM | POA: Insufficient documentation

## 2017-07-06 DIAGNOSIS — Z79899 Other long term (current) drug therapy: Secondary | ICD-10-CM | POA: Diagnosis not present

## 2017-07-06 DIAGNOSIS — Y929 Unspecified place or not applicable: Secondary | ICD-10-CM | POA: Diagnosis not present

## 2017-07-06 DIAGNOSIS — W0110XA Fall on same level from slipping, tripping and stumbling with subsequent striking against unspecified object, initial encounter: Secondary | ICD-10-CM | POA: Insufficient documentation

## 2017-07-06 HISTORY — DX: Nontraumatic intracerebral hemorrhage, unspecified: I61.9

## 2017-07-06 NOTE — ED Notes (Signed)
Pt returned from CT at this time.  

## 2017-07-06 NOTE — ED Provider Notes (Signed)
[facility] EMERGENCY DEPARTMENT Provider Note   CSN: [id number] Arrival date & time: 07/06/17  1354     History   Chief Complaint Chief Complaint  Patient presents with  . Fall    HPI Daisy Bennett is a 61 y.o. female.  HPI Patient with trip and fall striking the left side of her head.  This occurred roughly at 1230 today.  No loss of consciousness.  Patient complains of facial pain but denies headache.  No neck pain.  No focal weakness or numbness.  No visual changes.  Referred by primary physician to the emergency department for evaluation.  Patient is on Effient and aspirin daily. Past Medical History:  Diagnosis Date  . Basal cell carcinoma   . Brain bleed ([place])   . Cancer ([place])    basal and squamous cell skin cancer  . DM (diabetes mellitus) ([place])   . Goiter, unspecified   . History of colonic polyps 2012  . Multiple sclerosis ([place])   . Non-ST elevated myocardial infarction (non-STEMI) ([place])   . SCCA (squamous cell carcinoma) of skin    right shin  . SEMI (subendocardial myocardial infarction) ([place]) 09/29/09   TOTAL RCA WITH STENT TO OM BRANCH  . Shingles   . Squamous cell carcinoma   . Tobacco abuse   . Unspecified essential hypertension   . Unspecified hypothyroidism     Patient Active Problem List   Diagnosis Date Noted  . Memory deficit 06/17/2017  . Diabetes mellitus without complication ([place]) 78/24/2353  . Antiplatelet or antithrombotic long-term use 05/18/2016  . Subdural hematoma ([place]) 05/18/2016  . Morbid obesity ([place]) 05/12/2016  . History of colon polyps 12/15/2015  . Tobacco abuse counseling 03/20/2015  . Mixed hyperlipidemia 10/23/2009  . Coronary atherosclerosis 10/23/2009  . CARDIAC MURMUR 10/23/2009  . Hypothyroidism 10/15/2009  . Multiple sclerosis ([place]) 10/15/2009  . Essential hypertension 10/15/2009    Past Surgical History:  Procedure Laterality Date  . CATARACT SURGERY  12/10  . CESAREAN SECTION    . CORONARY  ANGIOPLASTY WITH STENT PLACEMENT    . TONSILLECTOMY    . VESICOVAGINAL FISTULA CLOSURE W/ TAH      OB History    No data available       Home Medications    Prior to Admission medications   Medication Sig Start Date End Date Taking? Authorizing Provider  amitriptyline (ELAVIL) 50 MG tablet TAKE 1 TABLET BY MOUTH EVERY DAY 06/27/17   Ward Givens, NP  amLODipine (NORVASC) 10 MG tablet Take 1 tablet (10 mg total) by mouth daily. 11/17/16   Josue Hector, MD  aspirin EC 81 MG tablet Take 1 tablet (81 mg total) daily by mouth. 06/09/17   Josue Hector, MD  carvedilol (COREG) 6.25 MG tablet Take 1 tablet (6.25 mg total) by mouth 2 (two) times daily with a meal. 11/17/16   Josue Hector, MD  cholecalciferol (VITAMIN D) 1000 units tablet Take 1,000 Units by mouth daily.    [provider]  CINNAMON PO Take 2,000 mg by mouth daily.     [provider]  donepezil (ARICEPT) 5 MG tablet 5 mg QD 06/17/17   Kuneff, Renee A, DO  esomeprazole (NEXIUM) 20 MG capsule Take 20 mg by mouth daily at 12 noon.    [provider]  fenofibrate 160 MG tablet Take 1 tablet (160 mg total) by mouth daily. 08/17/16   Kuneff, Renee A, DO  Ferrous Sulfate (IRON) 325 (65 FE) MG  TABS Take 325 mg by mouth daily.     [provider]  fish oil-omega-3 fatty acids 1000 MG capsule Take 1 g by mouth daily.     [provider]  gabapentin (NEURONTIN) 600 MG tablet TAKE 1 TABLET BY MOUTH TWICE A DAY 06/27/17   Ward Givens, NP  glimepiride (AMARYL) 2 MG tablet 1 tab in the morning and  1/2 tab in the evening. 06/17/17   Kuneff, Renee A, DO  glucose blood test strip DX:E11.9, monitor fasting BG daily. 04/16/15   Kuneff, Renee A, DO  hydrochlorothiazide (HYDRODIURIL) 25 MG tablet Take 1 tablet (25 mg total) daily by mouth. 06/17/17   Kuneff, Renee A, DO  levothyroxine (SYNTHROID, LEVOTHROID) 100 MCG tablet Take 1 tablet (100 mcg total) daily by mouth. 06/17/17   Kuneff, Renee A,  DO  losartan-hydrochlorothiazide (HYZAAR) 100-25 MG tablet TAKE 1 TABLET BY MOUTH DAILY. 01/31/17   Josue Hector, MD  metFORMIN (GLUCOPHAGE) 1000 MG tablet Take 1 tablet (1,000 mg total) 2 (two) times daily with a meal by mouth. 06/17/17   Kuneff, Renee A, DO  Multiple Vitamin (MULTIVITAMINS PO) Take 1 tablet by mouth at bedtime.     [provider]  nitroGLYCERIN (NITROSTAT) 0.4 MG SL tablet Place 1 tablet (0.4 mg total) under the tongue every 5 (five) minutes x 3 doses as needed for chest pain. 11/17/16 07/19/18  Josue Hector, MD  prasugrel (EFFIENT) 10 MG TABS tablet TAKE 1 TABLET BY MOUTH DAILY 11/18/16   Josue Hector, MD  rosuvastatin (CRESTOR) 10 MG tablet TAKE 1 TABLET BY MOUTH EVERY DAY 06/27/17   Josue Hector, MD  TECFIDERA 240 MG CPDR TAKE 1 CAPSULE TWICE DAILY 02/03/17   Kathrynn Ducking, MD  venlafaxine XR (EFFEXOR-XR) 150 MG 24 hr capsule Take 1 capsule (150 mg total) daily by mouth. 06/17/17   Kuneff, Renee A, DO    Family History Family History  Problem Relation Age of Onset  . Hypertension Mother   . Heart failure Father   . Aneurysm Brother        THORACIC/ABD ANEURYSM  . Prostate cancer Brother 84  . Fibromyalgia Sister   . Brain cancer Maternal Grandmother 50  . Multiple sclerosis Neg Hx     Social History Social History   Tobacco Use  . Smoking status: Former Smoker    Packs/day: 1.00    Last attempt to quit: 12/16/2015    Years since quitting: 1.5  . Smokeless tobacco: Never Used  Substance Use Topics  . Alcohol use: Yes    Alcohol/week: 0.0 oz    Comment: OCCASIONAL ALCOHOL USE.  . Drug use: No     Allergies   Betaseron [interferon beta-1b]; Topamax [topiramate]; and Doxycycline   Review of Systems Review of Systems  Constitutional: Negative for chills and fever.  HENT: Positive for facial swelling.   Eyes: Negative for visual disturbance.  Respiratory: Negative for cough and stridor.   Cardiovascular: Negative for chest pain,  palpitations and leg swelling.  Gastrointestinal: Negative for abdominal pain, constipation, diarrhea, nausea and vomiting.  Musculoskeletal: Negative for back pain, myalgias and neck pain.  Skin: Negative for rash.  Neurological: Negative for dizziness, syncope, weakness, light-headedness, numbness and headaches.  All other systems reviewed and are negative.    Physical Exam Updated Vital Signs BP (!) 155/63 (BP Location: Right Arm)   Pulse 73   Temp 98.5 F (36.9 C) (Oral)   Resp 18   Ht 5\' 6"  (  1.676 m)   Wt 87.1 kg (192 lb)   SpO2 100%   BMI 30.99 kg/m   Physical Exam  Constitutional: She is oriented to person, place, and time. She appears well-developed and well-nourished. No distress.  HENT:  Head: Normocephalic.  Mouth/Throat: Oropharynx is clear and moist.  Patient with hematoma over the left temporal region.  Tenderness to palpation.  No midface tenderness.  No malocclusion.  Eyes: EOM are normal. Pupils are equal, round, and reactive to light.  No evidence of eye trauma.  Full range of motion.  Neck: Normal range of motion. Neck supple.  No posterior midline cervical tenderness to palpation.  Cardiovascular: Normal rate and regular rhythm. Exam reveals no gallop and no friction rub.  No murmur heard. Pulmonary/Chest: Effort normal and breath sounds normal.  Abdominal: Soft. Bowel sounds are normal. There is no tenderness. There is no rebound and no guarding.  Musculoskeletal: Normal range of motion. She exhibits no edema or tenderness.  Neurological: She is alert and oriented to person, place, and time.  Patient is alert and oriented x3 with clear, goal oriented speech. Patient has 5/5 motor in all extremities. Sensation is intact to light touch. Patient has a normal gait and walks without assistance.  Skin: Skin is warm and dry. Capillary refill takes less than 2 seconds. No rash noted. No erythema.  Psychiatric: She has a normal mood and affect. Her behavior is  normal.  Nursing note and vitals reviewed.    ED Treatments / Results  Labs (all labs ordered are listed, but only abnormal results are displayed) Labs Reviewed - No data to display  EKG  EKG Interpretation None       Radiology Ct Head Wo Contrast  Result Date: 07/06/2017 CLINICAL DATA:  Head trauma, minor.  Patient on anticoagulation. EXAM: CT HEAD WITHOUT CONTRAST TECHNIQUE: Contiguous axial images were obtained from the base of the skull through the vertex without intravenous contrast. COMPARISON:  07/01/2016 FINDINGS: Brain: No evidence of acute infarction, hemorrhage, hydrocephalus, extra-axial collection or mass lesion/mass effect. Mild white matter disease without detectable change. Asymmetric lateral ventricles, stable and likely developmental. Vascular: Atherosclerotic calcification. Skull: Negative for fracture Sinuses/Orbits: Negative IMPRESSION: No evidence of intracranial injury. Electronically Signed   By: Monte Fantasia M.D.   On: 07/06/2017 14:36    Procedures Procedures (including critical care time)  Medications Ordered in ED Medications - No data to display   Initial Impression / Assessment and Plan / ED Course  I have reviewed the triage vital signs and the nursing notes.  Pertinent labs & imaging results that were available during my care of the patient were reviewed by me and considered in my medical decision making (see chart for details).    CT without intracranial abnormality.  Head injury precautions given.   Final Clinical Impressions(s) / ED Diagnoses   Final diagnoses:  Closed head injury, initial encounter  Facial contusion, initial encounter    ED Discharge Orders    None       Julianne Rice, MD 07/06/17 1441

## 2017-07-06 NOTE — Telephone Encounter (Signed)
Spoke to patient about fall. Patient reports tripping over concrete, catching herself on hand and face. She states she's already starting to bruise. With patients h/o subdural hematoma and taking Effient, advised patient to go to the Emergency Department for evaluation. Patient extremely reluctant about going to ER stating she refused to go d/t wait times. Encouraged patient to go to [facility] Emergency Department, patient agrees and plans to go now.

## 2017-07-06 NOTE — ED Triage Notes (Addendum)
Pt states she tripped/fell approx 1245p-bruising to left temporal/cheek area and abrasions to left fingers-NAD-presents to triage in w/c-pt states she drove self to ED

## 2017-07-06 NOTE — ED Notes (Signed)
ED Provider at bedside. 

## 2017-07-13 DIAGNOSIS — H43811 Vitreous degeneration, right eye: Secondary | ICD-10-CM | POA: Diagnosis not present

## 2017-07-13 DIAGNOSIS — H401130 Primary open-angle glaucoma, bilateral, stage unspecified: Secondary | ICD-10-CM | POA: Diagnosis not present

## 2017-07-14 ENCOUNTER — Ambulatory Visit: Payer: 59 | Admitting: Adult Health

## 2017-07-15 ENCOUNTER — Ambulatory Visit: Payer: 59 | Admitting: Family Medicine

## 2017-07-15 ENCOUNTER — Encounter: Payer: Self-pay | Admitting: Family Medicine

## 2017-07-15 VITALS — BP 120/69 | HR 69 | Temp 97.5°F | Wt 192.0 lb

## 2017-07-15 DIAGNOSIS — S065X9A Traumatic subdural hemorrhage with loss of consciousness of unspecified duration, initial encounter: Secondary | ICD-10-CM

## 2017-07-15 DIAGNOSIS — F329 Major depressive disorder, single episode, unspecified: Secondary | ICD-10-CM | POA: Diagnosis not present

## 2017-07-15 DIAGNOSIS — G35 Multiple sclerosis: Secondary | ICD-10-CM

## 2017-07-15 DIAGNOSIS — S065XAA Traumatic subdural hemorrhage with loss of consciousness status unknown, initial encounter: Secondary | ICD-10-CM

## 2017-07-15 DIAGNOSIS — R413 Other amnesia: Secondary | ICD-10-CM

## 2017-07-15 MED ORDER — VENLAFAXINE HCL ER 150 MG PO CP24: 150.0000 mg | ORAL_CAPSULE | Freq: Every day | ORAL | 1 refills | Status: DC

## 2017-07-15 MED ORDER — DONEPEZIL HCL 10 MG PO TABS: 10.0000 mg | ORAL_TABLET | Freq: Every day | ORAL | 1 refills | Status: DC

## 2017-07-15 NOTE — Patient Instructions (Signed)
I am glad you are doing so well.  I hope you have a happy holiday and enjoy that [place]!!!  Keep a watch on that eye/visual changes--> go to eye doctor immediately if worsening or does not resolve.  I have refilled your Effexor at 150 mg a day and Aricept ay 10 mg a day.    We will follow up at your routine visit for diabetes.

## 2017-07-15 NOTE — Progress Notes (Signed)
Daisy Bennett , 01-29-1956, 61 y.o., female MRN: [id number] Patient Care Team    Relationship Specialty Notifications Start End  Ma Hillock, DO PCP - General Family Medicine  03/10/15   Devra Dopp, MD Referring Physician Dermatology  02/10/16   Kathrynn Ducking, MD Consulting Physician Neurology  05/18/16   Linda Hedges, [place] Physician Obstetrics and Gynecology  06/04/16   Anell Barr, OD  Optometry  08/17/16     Chief Complaint  Patient presents with  . Memory Loss    f/u on meds aricept     Subjective:  Memory loss/dementia/s/p subdural hematoma/depression:  Pt reports she feels better since increasing the effexor to 150 mg a day and starting the aricept. She feels she does have more clarity. She is not feeling overwhelmed or depressed. She is able to focus better at work.   Prior note:  Pt reports she is experiencing increased memory loss. Her family has showed concern and states she routinely forgets what she has said or done, as well as forgets to complete task. She is having difficulty at work and thinks she is in jeopardy of losing her job. She has a neurologist, and has seemed important issue. He was felt that it was likely from her multiple sclerosis medications. However since her trauma with subdural hematoma she has steadily declined in her memory loss. She has an upcoming appointment with her neurologist in 3-4 weeks. She also saw a neuropsychiatrist at [facility] for this issue, however it was just before she had fallen, and things have progressed quite significantly since that time. Patient reports she is scared, because she loses her job she will not have any benefits/health insurance and financial strain would be significant. She feels she is more depressed secondary to all these changes. She does have a lot of happiness in her new grandchild was recently born.  Depression screen [facility] 2/9 06/17/2017 06/04/2016  Decreased Interest 0 0  Down,  Depressed, Hopeless 0 0  PHQ - 2 Score 0 0    Allergies  Allergen Reactions  . Betaseron [Interferon Beta-1b] Other (See Comments)    Increased LFT's  . Topamax [Topiramate]     Cognitive slowing  . Doxycycline Other (See Comments)    Thrush,dizziness   Social History   Tobacco Use  . Smoking status: Former Smoker    Packs/day: 1.00    Last attempt to quit: 12/16/2015    Years since quitting: 1.5  . Smokeless tobacco: Never Used  Substance Use Topics  . Alcohol use: Yes    Alcohol/week: 0.0 oz    Comment: OCCASIONAL ALCOHOL USE.   Past Medical History:  Diagnosis Date  . Basal cell carcinoma   . Brain bleed ([place])   . Cancer ([place])    basal and squamous cell skin cancer  . DM (diabetes mellitus) ([place])   . Goiter, unspecified   . History of colonic polyps 2012  . Multiple sclerosis ([place])   . Non-ST elevated myocardial infarction (non-STEMI) ([place])   . SCCA (squamous cell carcinoma) of skin    right shin  . SEMI (subendocardial myocardial infarction) ([place]) 09/29/09   TOTAL RCA WITH STENT TO OM BRANCH  . Shingles   . Squamous cell carcinoma   . Tobacco abuse   . Unspecified essential hypertension   . Unspecified hypothyroidism    Past Surgical History:  Procedure Laterality Date  . CATARACT SURGERY  12/10  . CESAREAN SECTION    . CORONARY  ANGIOPLASTY WITH STENT PLACEMENT    . TONSILLECTOMY    . VESICOVAGINAL FISTULA CLOSURE W/ TAH     Family History  Problem Relation Age of Onset  . Hypertension Mother   . Heart failure Father   . Aneurysm Brother        THORACIC/ABD ANEURYSM  . Prostate cancer Brother 43  . Fibromyalgia Sister   . Brain cancer Maternal Grandmother 25  . Multiple sclerosis Neg Hx    Allergies as of 07/15/2017      Reactions   Betaseron [interferon Beta-1b] Other (See Comments)   Increased LFT's   Topamax [topiramate]    Cognitive slowing   Doxycycline Other (See Comments)   Thrush,dizziness      Medication List        Accurate as  of 07/15/17  9:53 AM. Always use your most recent med list.          amitriptyline 50 MG tablet Commonly known as:  ELAVIL TAKE 1 TABLET BY MOUTH EVERY DAY   amLODipine 10 MG tablet Commonly known as:  NORVASC Take 1 tablet (10 mg total) by mouth daily.   aspirin EC 81 MG tablet Take 1 tablet (81 mg total) daily by mouth.   carvedilol 6.25 MG tablet Commonly known as:  COREG Take 1 tablet (6.25 mg total) by mouth 2 (two) times daily with a meal.   cholecalciferol 1000 units tablet Commonly known as:  VITAMIN D Take 1,000 Units by mouth daily.   CINNAMON PO Take 2,000 mg by mouth daily.   donepezil 5 MG tablet Commonly known as:  ARICEPT 5 mg QD   esomeprazole 20 MG capsule Commonly known as:  NEXIUM Take 20 mg by mouth daily at 12 noon.   fenofibrate 160 MG tablet Take 1 tablet (160 mg total) by mouth daily.   fish oil-omega-3 fatty acids 1000 MG capsule Take 1 g by mouth daily.   gabapentin 600 MG tablet Commonly known as:  NEURONTIN TAKE 1 TABLET BY MOUTH TWICE A DAY   glimepiride 2 MG tablet Commonly known as:  AMARYL 1 tab in the morning and  1/2 tab in the evening.   glucose blood test strip DX:E11.9, monitor fasting BG daily.   hydrochlorothiazide 25 MG tablet Commonly known as:  HYDRODIURIL Take 1 tablet (25 mg total) daily by mouth.   Iron 325 (65 Fe) MG Tabs Take 325 mg by mouth daily.   levothyroxine 100 MCG tablet Commonly known as:  SYNTHROID, LEVOTHROID Take 1 tablet (100 mcg total) daily by mouth.   losartan-hydrochlorothiazide 100-25 MG tablet Commonly known as:  HYZAAR TAKE 1 TABLET BY MOUTH DAILY.   metFORMIN 1000 MG tablet Commonly known as:  GLUCOPHAGE Take 1 tablet (1,000 mg total) 2 (two) times daily with a meal by mouth.   MULTIVITAMINS PO Take 1 tablet by mouth at bedtime.   nitroGLYCERIN 0.4 MG SL tablet Commonly known as:  NITROSTAT Place 1 tablet (0.4 mg total) under the tongue every 5 (five) minutes x 3 doses as  needed for chest pain.   prasugrel 10 MG Tabs tablet Commonly known as:  EFFIENT TAKE 1 TABLET BY MOUTH DAILY   rosuvastatin 10 MG tablet Commonly known as:  CRESTOR TAKE 1 TABLET BY MOUTH EVERY DAY   TECFIDERA 240 MG Cpdr Generic drug:  Dimethyl Fumarate TAKE 1 CAPSULE TWICE DAILY   venlafaxine XR 150 MG 24 hr capsule Commonly known as:  EFFEXOR-XR Take 1 capsule (150 mg total) daily by mouth.  All past medical history, surgical history, allergies, family history, immunizations andmedications were updated in the EMR today and reviewed under the history and medication portions of their EMR.     ROS: Negative, with the exception of above mentioned in HPI   Objective:  BP 120/69 (BP Location: Right Arm, Patient Position: Sitting, Cuff Size: Large)   Pulse 69   Temp (!) 97.5 F (36.4 C) (Oral)   Wt 192 lb (87.1 kg)   SpO2 100%   BMI 30.99 kg/m  Body mass index is 30.99 kg/m. Gen: Afebrile. No acute distress.   Neuro:  Normal gait. PERLA. EOMi. Alert. Oriented x3  Psych: Normal affect, dress and demeanor. Normal speech. Normal thought content and judgment. Pleasant, makes good eye contact.    No exam data present No results found. No results found for this or any previous visit (from the past 24 hour(s)).  Assessment/Plan: Daisy Bennett is a 61 y.o. female present for OV for  Memory loss /depression/hypothyroid/subdural hematoma/MS:  - Pt has had a good response to increase dose effexor and aricpet. She is feeling "back to herself" now.  - Incease aricpet to 10 mg QD.  - Continue effexor 150. - refills provided on both medications today.  - F/u routinely with chronic medical conditions appt.     Reviewed expectations re: course of current medical issues.  Discussed self-management of symptoms.  Outlined signs and symptoms indicating need for more acute intervention.  Patient verbalized understanding and all questions were answered.  Patient received  an After-Visit Summary.    No orders of the defined types were placed in this encounter.   Greater than 40 minutes spent with patient, >50% of time spent face to face counseling    Note is dictated utilizing voice recognition software. Although note has been proof read prior to signing, occasional typographical errors still can be missed. If any questions arise, please do not hesitate to call for verification.   electronically signed by:  Howard Pouch, DO  [place]

## 2017-07-18 ENCOUNTER — Other Ambulatory Visit: Payer: Self-pay | Admitting: *Deleted

## 2017-07-18 MED ORDER — FENOFIBRATE 160 MG PO TABS: 160.0000 mg | ORAL_TABLET | Freq: Every day | ORAL | 1 refills | Status: DC

## 2017-07-21 ENCOUNTER — Telehealth: Payer: Self-pay | Admitting: Family Medicine

## 2017-07-21 NOTE — Telephone Encounter (Signed)
Copied from [place]. Topic: Quick Communication - See Telephone Encounter >> Jul 21, 2017  2:33 PM Cleaster Corin, NT wrote: CRM for notification. See Telephone encounter for:   07/21/17. Pt. Calling to see if the approval letter was sent from [facility] [phone] to have surgery for her left knee. [facility] said they faxed paper over twice 11-28 and 12-14 pt. Can be reached at [phone]

## 2017-07-22 NOTE — Telephone Encounter (Signed)
Spoke with patient clearance form was completed and faxed.

## 2017-07-29 ENCOUNTER — Encounter: Payer: Self-pay | Admitting: Cardiovascular Disease

## 2017-08-01 ENCOUNTER — Telehealth: Payer: Self-pay | Admitting: *Deleted

## 2017-08-01 NOTE — Telephone Encounter (Signed)
PT IS CLEARED FOR HER SURGERY, NOTES FAXED TO [facility] # [phone]

## 2017-08-01 NOTE — Telephone Encounter (Signed)
Follow up    Patient requesting a response to her email. Please call

## 2017-08-02 DIAGNOSIS — IMO0002 Reserved for concepts with insufficient information to code with codable children: Secondary | ICD-10-CM

## 2017-08-02 HISTORY — DX: Reserved for concepts with insufficient information to code with codable children: IMO0002

## 2017-08-17 DIAGNOSIS — M2242 Chondromalacia patellae, left knee: Secondary | ICD-10-CM | POA: Diagnosis not present

## 2017-08-17 DIAGNOSIS — S83232A Complex tear of medial meniscus, current injury, left knee, initial encounter: Secondary | ICD-10-CM | POA: Diagnosis not present

## 2017-08-17 DIAGNOSIS — G8918 Other acute postprocedural pain: Secondary | ICD-10-CM | POA: Diagnosis not present

## 2017-09-13 ENCOUNTER — Encounter: Payer: Self-pay | Admitting: Family Medicine

## 2017-09-13 ENCOUNTER — Ambulatory Visit (INDEPENDENT_AMBULATORY_CARE_PROVIDER_SITE_OTHER): Payer: 59 | Admitting: Family Medicine

## 2017-09-13 VITALS — BP 157/78 | HR 130 | Temp 100.5°F | Resp 20 | Wt 192.5 lb

## 2017-09-13 DIAGNOSIS — A084 Viral intestinal infection, unspecified: Secondary | ICD-10-CM | POA: Diagnosis not present

## 2017-09-13 DIAGNOSIS — R6889 Other general symptoms and signs: Secondary | ICD-10-CM | POA: Diagnosis not present

## 2017-09-13 LAB — POC INFLUENZA A&B (BINAX/QUICKVUE)
Influenza A, POC: NEGATIVE
Influenza B, POC: NEGATIVE

## 2017-09-13 MED ORDER — ONDANSETRON HCL 4 MG/2ML IJ SOLN
4.0000 mg | Freq: Once | INTRAMUSCULAR | Status: AC
  Administered 2017-09-13: 4 mg via INTRAMUSCULAR

## 2017-09-13 MED ORDER — ONDANSETRON HCL 4 MG PO TABS: 4.0000 mg | ORAL_TABLET | Freq: Three times a day (TID) | ORAL | 0 refills | Status: DC | PRN

## 2017-09-13 NOTE — Patient Instructions (Addendum)
Hold losartan HCTZ for 2 days until hydrating well.  Zofran prescribed take every 8 hours for first 2 days to allow adequate hydration. Then take as needed.  Avoid dairy products. First 48 hours consume clear liquids only.  No antidiarrheals  unless > 10 stools a day. Then can take an imodium if needed.  If worsening symptoms you will need to go to ED to receive IV fluids/hydration.     Viral Gastroenteritis, Adult Viral gastroenteritis is also known as the stomach flu. This condition is caused by certain germs (viruses). These germs can be passed from person to person very easily (are very contagious). This condition can cause sudden watery poop (diarrhea), fever, and throwing up (vomiting). Having watery poop and throwing up can make you feel weak and cause you to get dehydrated. Dehydration can make you tired and thirsty, make you have a dry mouth, and make it so you pee (urinate) less often. Older adults and people with other diseases or a weak defense system (immune system) are at higher risk for dehydration. It is important to replace the fluids that you lose from having watery poop and throwing up. Follow these instructions at home: Follow instructions from your doctor about how to care for yourself at home. Eating and drinking  Follow these instructions as told by your doctor:  Take an oral rehydration solution (ORS). This is a drink that is sold at pharmacies and stores.  Drink clear fluids in small amounts as you are able, such as: ? Water. ? Ice chips. ? Diluted fruit juice. ? Low-calorie sports drinks.  Eat bland, easy-to-digest foods in small amounts as you are able, such as: ? Bananas. ? Applesauce. ? Rice. ? Low-fat (lean) meats. ? Toast. ? Crackers.  Avoid fluids that have a lot of sugar or caffeine in them.  Avoid alcohol.  Avoid spicy or fatty foods.  General instructions  Drink enough fluid to keep your pee (urine) clear or pale yellow.  Wash your hands  often. If you cannot use soap and water, use hand sanitizer.  Make sure that all people in your home wash their hands well and often.  Rest at home while you get better.  Take over-the-counter and prescription medicines only as told by your doctor.  Watch your condition for any changes.  Take a warm bath to help with any burning or pain from having watery poop.  Keep all follow-up visits as told by your doctor. This is important. Contact a doctor if:  You cannot keep fluids down.  Your symptoms get worse.  You have new symptoms.  You feel light-headed or dizzy.  You have muscle cramps. Get help right away if:  You have chest pain.  You feel very weak or you pass out (faint).  You see blood in your throw-up.  Your throw-up looks like coffee grounds.  You have bloody or black poop (stools) or poop that look like tar.  You have a very bad headache, a stiff neck, or both.  You have a rash.  You have very bad pain, cramping, or bloating in your belly (abdomen).  You have trouble breathing.  You are breathing very quickly.  Your heart is beating very quickly.  Your skin feels cold and clammy.  You feel confused.  You have pain when you pee.  You have signs of dehydration, such as: ? Dark pee, hardly any pee, or no pee. ? Cracked lips. ? Dry mouth. ? Sunken eyes. ? Sleepiness. ? Weakness.  This information is not intended to replace advice given to you by your health care provider. Make sure you discuss any questions you have with your health care provider. Document Released: 01/05/2008 Document Revised: 02/06/2016 Document Reviewed: 03/25/2015 Elsevier Interactive Patient Education  2017 [employer].

## 2017-09-13 NOTE — Progress Notes (Signed)
Daisy Bennett , 1956/05/09, 62 y.o., female MRN: [id number] Patient Care Team    Relationship Specialty Notifications Start End  Ma Hillock, DO PCP - General Family Medicine  03/10/15   Devra Dopp, MD Referring Physician Dermatology  02/10/16   Kathrynn Ducking, MD Consulting Physician Neurology  05/18/16   Linda Hedges, [place] Physician Obstetrics and Gynecology  06/04/16   Anell Barr, OD  Optometry  08/17/16     Chief Complaint  Patient presents with  . URI    vomiting,fever,diarrhea     Subjective: Pt presents for an OV with complaints of fever, vomit and diarrhea of 8 hours duration.  Associated symptoms include fatigue. Pt reports she has had 5 stools since this morning. She has vomited multiple times, including on the way to the office. She denies consuming under prepared foods, travel or recent abx. She was around her grandchildren that were ill with similar illness.    Depression screen [facility] 2/9 06/17/2017 06/04/2016  Decreased Interest 0 0  Down, Depressed, Hopeless 0 0  PHQ - 2 Score 0 0    Allergies  Allergen Reactions  . Betaseron [Interferon Beta-1b] Other (See Comments)    Increased LFT's  . Topamax [Topiramate]     Cognitive slowing  . Doxycycline Other (See Comments)    Thrush,dizziness   Social History   Tobacco Use  . Smoking status: Former Smoker    Packs/day: 1.00    Last attempt to quit: 12/16/2015    Years since quitting: 1.7  . Smokeless tobacco: Never Used  Substance Use Topics  . Alcohol use: Yes    Alcohol/week: 0.0 oz    Comment: OCCASIONAL ALCOHOL USE.   Past Medical History:  Diagnosis Date  . Basal cell carcinoma   . Brain bleed ([place])   . Cancer ([place])    basal and squamous cell skin cancer  . DM (diabetes mellitus) ([place])   . Goiter, unspecified   . History of colonic polyps 2012  . Multiple sclerosis ([place])   . Non-ST elevated myocardial infarction (non-STEMI) ([place])   . SCCA (squamous cell carcinoma)  of skin    right shin  . SEMI (subendocardial myocardial infarction) ([place]) 09/29/09   TOTAL RCA WITH STENT TO OM BRANCH  . Shingles   . Squamous cell carcinoma   . Tobacco abuse   . Unspecified essential hypertension   . Unspecified hypothyroidism    Past Surgical History:  Procedure Laterality Date  . CATARACT SURGERY  12/10  . CESAREAN SECTION    . CORONARY ANGIOPLASTY WITH STENT PLACEMENT    . TONSILLECTOMY    . VESICOVAGINAL FISTULA CLOSURE W/ TAH     Family History  Problem Relation Age of Onset  . Hypertension Mother   . Heart failure Father   . Aneurysm Brother        THORACIC/ABD ANEURYSM  . Prostate cancer Brother 50  . Fibromyalgia Sister   . Brain cancer Maternal Grandmother 30  . Multiple sclerosis Neg Hx    Allergies as of 09/13/2017      Reactions   Betaseron [interferon Beta-1b] Other (See Comments)   Increased LFT's   Topamax [topiramate]    Cognitive slowing   Doxycycline Other (See Comments)   Thrush,dizziness      Medication List        Accurate as of 09/13/17  4:45 PM. Always use your most recent med list.  amitriptyline 50 MG tablet Commonly known as:  ELAVIL TAKE 1 TABLET BY MOUTH EVERY DAY   amLODipine 10 MG tablet Commonly known as:  NORVASC Take 1 tablet (10 mg total) by mouth daily.   aspirin EC 81 MG tablet Take 1 tablet (81 mg total) daily by mouth.   carvedilol 6.25 MG tablet Commonly known as:  COREG Take 1 tablet (6.25 mg total) by mouth 2 (two) times daily with a meal.   cholecalciferol 1000 units tablet Commonly known as:  VITAMIN D Take 1,000 Units by mouth daily.   CINNAMON PO Take 2,000 mg by mouth daily.   donepezil 10 MG tablet Commonly known as:  ARICEPT Take 1 tablet (10 mg total) by mouth at bedtime.   esomeprazole 20 MG capsule Commonly known as:  NEXIUM Take 20 mg by mouth daily at 12 noon.   fenofibrate 160 MG tablet Take 1 tablet (160 mg total) by mouth daily.   fish oil-omega-3 fatty  acids 1000 MG capsule Take 1 g by mouth daily.   gabapentin 600 MG tablet Commonly known as:  NEURONTIN TAKE 1 TABLET BY MOUTH TWICE A DAY   glimepiride 2 MG tablet Commonly known as:  AMARYL 1 tab in the morning and  1/2 tab in the evening.   glucose blood test strip DX:E11.9, monitor fasting BG daily.   hydrochlorothiazide 25 MG tablet Commonly known as:  HYDRODIURIL Take 1 tablet (25 mg total) daily by mouth.   Iron 325 (65 Fe) MG Tabs Take 325 mg by mouth daily.   levothyroxine 100 MCG tablet Commonly known as:  SYNTHROID, LEVOTHROID Take 1 tablet (100 mcg total) daily by mouth.   losartan-hydrochlorothiazide 100-25 MG tablet Commonly known as:  HYZAAR TAKE 1 TABLET BY MOUTH DAILY.   metFORMIN 1000 MG tablet Commonly known as:  GLUCOPHAGE Take 1 tablet (1,000 mg total) 2 (two) times daily with a meal by mouth.   MULTIVITAMINS PO Take 1 tablet by mouth at bedtime.   nitroGLYCERIN 0.4 MG SL tablet Commonly known as:  NITROSTAT Place 1 tablet (0.4 mg total) under the tongue every 5 (five) minutes x 3 doses as needed for chest pain.   ondansetron 4 MG tablet Commonly known as:  ZOFRAN Take 1 tablet (4 mg total) by mouth every 8 (eight) hours as needed for nausea or vomiting.   prasugrel 10 MG Tabs tablet Commonly known as:  EFFIENT TAKE 1 TABLET BY MOUTH DAILY   rosuvastatin 10 MG tablet Commonly known as:  CRESTOR TAKE 1 TABLET BY MOUTH EVERY DAY   TECFIDERA 240 MG Cpdr Generic drug:  Dimethyl Fumarate TAKE 1 CAPSULE TWICE DAILY   venlafaxine XR 150 MG 24 hr capsule Commonly known as:  EFFEXOR-XR Take 1 capsule (150 mg total) by mouth daily.       All past medical history, surgical history, allergies, family history, immunizations andmedications were updated in the EMR today and reviewed under the history and medication portions of their EMR.     ROS: Negative, with the exception of above mentioned in HPI   Objective:  BP (!) 157/78 (BP Location:  Left Arm, Patient Position: Sitting, Cuff Size: Large)   Pulse (!) 130   Temp (!) 100.5 F (38.1 C)   Resp 20   Wt 192 lb 8 oz (87.3 kg)   SpO2 97%   BMI 31.07 kg/m  Body mass index is 31.07 kg/m. Gen: febrile. No acute distress. Appears ill today, but Nontoxic in appearance, well developed, well  nourished.  HENT: AT. [place]. Bilateral TM visualized without erythema or fullness. MMM, no oral lesions. Bilateral nares without erythema or drainage. Throat without erythema or exudates. No cough Eyes:Pupils Equal Round Reactive to light, Extraocular movements intact,  Conjunctiva without redness, discharge or icterus. Neck/lymp/endocrine: Supple, no lymphadenopathy CV: Tachycardic, no edema Chest: CTAB, no wheeze or crackles. Good air movement, normal resp effort.  Abd: Soft.. NTND. BS present.  Neuro:  Normal gait. PERLA. EOMi. Alert. Oriented x3   No exam data present No results found. No results found for this or any previous visit (from the past 24 hour(s)).  Assessment/Plan: BAYLA MCGOVERN is a 62 y.o. female present for OV for  Flu-like symptoms - POC Influenza A&B (Binax test)--> negative flu test Viral gastroenteritis - Rest, hydrate hydrate hydrate. Clear liquid diet next 48 hours. IM Zofran provided today. Zofran 4 mg by mouth prescribed patient to take every 8 hours for next 24 hours, then every 8 hours when necessary. - ondansetron (ZOFRAN) injection 4 mg - Old losartan HCTZ 1-2 days until maintaining hydration - Patient was encouraged to go to the emergency room if unable to tolerate by mouth she would need IV fluids.  Reviewed expectations re: course of current medical issues.  Discussed self-management of symptoms.  Outlined signs and symptoms indicating need for more acute intervention.  Patient verbalized understanding and all questions were answered.  Patient received an After-Visit Summary.    Orders Placed This Encounter  Procedures  . POC Influenza A&B  (Binax test)     Note is dictated utilizing voice recognition software. Although note has been proof read prior to signing, occasional typographical errors still can be missed. If any questions arise, please do not hesitate to call for verification.   electronically signed by:  Howard Pouch, DO  [place]

## 2017-09-28 DIAGNOSIS — H43811 Vitreous degeneration, right eye: Secondary | ICD-10-CM | POA: Diagnosis not present

## 2017-09-28 DIAGNOSIS — H401130 Primary open-angle glaucoma, bilateral, stage unspecified: Secondary | ICD-10-CM | POA: Diagnosis not present

## 2017-09-28 DIAGNOSIS — E119 Type 2 diabetes mellitus without complications: Secondary | ICD-10-CM | POA: Diagnosis not present

## 2017-10-06 ENCOUNTER — Encounter: Payer: Self-pay | Admitting: Family Medicine

## 2017-10-13 ENCOUNTER — Other Ambulatory Visit: Payer: Self-pay | Admitting: Cardiovascular Disease

## 2017-10-17 ENCOUNTER — Ambulatory Visit: Payer: 59 | Admitting: Family Medicine

## 2017-10-17 ENCOUNTER — Encounter: Payer: Self-pay | Admitting: Family Medicine

## 2017-10-17 VITALS — BP 129/62 | HR 74 | Temp 98.0°F | Ht 66.0 in | Wt 192.4 lb

## 2017-10-17 DIAGNOSIS — E039 Hypothyroidism, unspecified: Secondary | ICD-10-CM

## 2017-10-17 DIAGNOSIS — Z7902 Long term (current) use of antithrombotics/antiplatelets: Secondary | ICD-10-CM

## 2017-10-17 DIAGNOSIS — E119 Type 2 diabetes mellitus without complications: Secondary | ICD-10-CM | POA: Diagnosis not present

## 2017-10-17 DIAGNOSIS — M7022 Olecranon bursitis, left elbow: Secondary | ICD-10-CM | POA: Diagnosis not present

## 2017-10-17 DIAGNOSIS — G35 Multiple sclerosis: Secondary | ICD-10-CM

## 2017-10-17 DIAGNOSIS — Z23 Encounter for immunization: Secondary | ICD-10-CM | POA: Diagnosis not present

## 2017-10-17 DIAGNOSIS — I10 Essential (primary) hypertension: Secondary | ICD-10-CM

## 2017-10-17 DIAGNOSIS — S065XAA Traumatic subdural hemorrhage with loss of consciousness status unknown, initial encounter: Secondary | ICD-10-CM

## 2017-10-17 DIAGNOSIS — S065X9A Traumatic subdural hemorrhage with loss of consciousness of unspecified duration, initial encounter: Secondary | ICD-10-CM | POA: Diagnosis not present

## 2017-10-17 DIAGNOSIS — I25118 Atherosclerotic heart disease of native coronary artery with other forms of angina pectoris: Secondary | ICD-10-CM

## 2017-10-17 DIAGNOSIS — R413 Other amnesia: Secondary | ICD-10-CM

## 2017-10-17 DIAGNOSIS — E782 Mixed hyperlipidemia: Secondary | ICD-10-CM | POA: Diagnosis not present

## 2017-10-17 HISTORY — DX: Olecranon bursitis, left elbow: M70.22

## 2017-10-17 LAB — POCT GLYCOSYLATED HEMOGLOBIN (HGB A1C): Hemoglobin A1C: 6.5

## 2017-10-17 MED ORDER — AMITRIPTYLINE HCL 75 MG PO TABS: 75.0000 mg | ORAL_TABLET | Freq: Every day | ORAL | 1 refills | Status: DC

## 2017-10-17 MED ORDER — POTASSIUM CHLORIDE ER 10 MEQ PO TBCR: 10.0000 meq | EXTENDED_RELEASE_TABLET | Freq: Every day | ORAL | 3 refills | Status: DC

## 2017-10-17 MED ORDER — VENLAFAXINE HCL ER 150 MG PO CP24
150.0000 mg | ORAL_CAPSULE | Freq: Every day | ORAL | 1 refills | Status: DC
Start: 2017-10-17 — End: 2018-05-18

## 2017-10-17 MED ORDER — HYDROCHLOROTHIAZIDE 25 MG PO TABS: 25.0000 mg | ORAL_TABLET | Freq: Every day | ORAL | 0 refills | Status: DC

## 2017-10-17 MED ORDER — GLIMEPIRIDE 2 MG PO TABS: ORAL_TABLET | ORAL | 1 refills | Status: DC

## 2017-10-17 MED ORDER — FENOFIBRATE 160 MG PO TABS: 160.0000 mg | ORAL_TABLET | Freq: Every day | ORAL | 1 refills | Status: DC

## 2017-10-17 MED ORDER — DONEPEZIL HCL 10 MG PO TABS: 10.0000 mg | ORAL_TABLET | Freq: Every day | ORAL | 1 refills | Status: DC

## 2017-10-17 MED ORDER — METFORMIN HCL 1000 MG PO TABS: 1000.0000 mg | ORAL_TABLET | Freq: Two times a day (BID) | ORAL | 5 refills | Status: DC

## 2017-10-17 NOTE — Progress Notes (Signed)
Daisy Bennett , 1956/01/30, 62 y.o., female MRN: [id number] Patient Care Team    Relationship Specialty Notifications Start End  Ma Hillock, DO PCP - General Family Medicine  03/10/15   Devra Dopp, MD Referring Physician Dermatology  02/10/16   Kathrynn Ducking, MD Consulting Physician Neurology  05/18/16   Linda Hedges, [place] Physician Obstetrics and Gynecology  06/04/16   Anell Barr, OD  Optometry  08/17/16     Chief Complaint  Patient presents with  . Follow-up    DM     Subjective:  Diabetes/BMI30: Patient presents for routine scheduled diabetes follow-up. Last A1c was  6.4.  Today she reports compliance with Amaryl  2 mg/1mg  daily and metformin 1000 mg BID and she is tolerating them. She is still  not checking her sugars. Patient denies dizziness, hyperglycemic or hypoglycemic events. Patient denies numbness, tingling in the extremities or nonhealing wounds of feet.    She is on gabapentin 600 mg BID. - POCT HgB A1C-->  6.5 --> 7.2--> 6.1 --> 6.0--> 6.4--> 6.5 today - PSV 23 given 10/2015, Prevnar 10/2016--> competed series -  foot exam 11/15/2016 - Eye exam 2/26//2018; Dr. Venetia Constable - Microalbumin: on an ARB - flu shot UTD 05/12/2016  Hypertension/hyperlipidemia: Today She reports compliance with her medication regimen of Norvasc 10 mg, Coreg 6.25mg  BID, HCTZ 25 mg QD, Losartan/HCTZ 100-25. H/O CAD, NSTEMI, post stent 2011 ([facility])-collateralized RCA.  Pt is on BB, statin (low dose), ACE, fenofibrate, ASA 325. She follows with Dr. Johnsie Cancel. BMP:06/22/2017 CBC: 06/22/2017 Lipids: 06/21/2017 WNL, tg mildly elevated Diet: low sodium Exercise: routine   Memory loss/dementia/s/p subdural hematoma/depression/MS:  Doing well. Reports symptoms are well controlled. She reports compliance with Effexor 150 mg QD, Aricept 10 mg Qd. She is compliant with amitriptyline and gabapentin prescribed through neuro. She doe snot feel her  Night time symptoms are as  controlled as prior. Prior note:  Pt reports she is experiencing increased memory loss. Her family has showed concern and states she routinely forgets what she has said or done, as well as forgets to complete task. She is having difficulty at work and thinks she is in jeopardy of losing her job. She has a neurologist, and has seemed important issue. He was felt that it was likely from her multiple sclerosis medications. However since her trauma with subdural hematoma she has steadily declined in her memory loss. She has an upcoming appointment with her neurologist in 3-4 weeks. She also saw a neuropsychiatrist at [facility] for this issue, however it was just before she had fallen, and things have progressed quite significantly since that time. Patient reports she is scared, because she loses her job she will not have any benefits/health insurance and financial strain would be significant. She feels she is more depressed secondary to all these changes. She does have a lot of happiness in her new grandchild was recently born.   olecranon bursitis: Approximately 1 week. Pt reports it is tender, but denies fever, chills, redness or drainage. She has never had a similar occurrence. She denies any known injury. She has not tried anything.   Depression screen [facility] 2/9 06/17/2017 06/04/2016  Decreased Interest 0 0  Down, Depressed, Hopeless 0 0  PHQ - 2 Score 0 0    Allergies  Allergen Reactions  . Betaseron [Interferon Beta-1b] Other (See Comments)    Increased LFT's  . Topamax [Topiramate]     Cognitive slowing  . Doxycycline Other (See  Comments)    Thrush,dizziness   Social History   Tobacco Use  . Smoking status: Former Smoker    Packs/day: 1.00    Last attempt to quit: 12/16/2015    Years since quitting: 1.8  . Smokeless tobacco: Never Used  Substance Use Topics  . Alcohol use: Yes    Alcohol/week: 0.0 oz    Comment: OCCASIONAL ALCOHOL USE.   Past Medical History:  Diagnosis Date  .  Basal cell carcinoma   . Brain bleed ([place])   . Cancer ([place])    basal and squamous cell skin cancer  . DM (diabetes mellitus) ([place])   . Goiter, unspecified   . History of colonic polyps 2012  . Multiple sclerosis ([place])   . Non-ST elevated myocardial infarction (non-STEMI) ([place])   . SCCA (squamous cell carcinoma) of skin    right shin  . SEMI (subendocardial myocardial infarction) ([place]) 09/29/09   TOTAL RCA WITH STENT TO OM BRANCH  . Shingles   . Squamous cell carcinoma   . Tobacco abuse   . Unspecified essential hypertension   . Unspecified hypothyroidism    Past Surgical History:  Procedure Laterality Date  . CATARACT SURGERY  12/10  . CESAREAN SECTION    . CORONARY ANGIOPLASTY WITH STENT PLACEMENT    . TONSILLECTOMY    . VESICOVAGINAL FISTULA CLOSURE W/ TAH     Family History  Problem Relation Age of Onset  . Hypertension Mother   . Heart failure Father   . Aneurysm Brother        THORACIC/ABD ANEURYSM  . Prostate cancer Brother 27  . Fibromyalgia Sister   . Brain cancer Maternal Grandmother 63  . Multiple sclerosis Neg Hx    Allergies as of 10/17/2017      Reactions   Betaseron [interferon Beta-1b] Other (See Comments)   Increased LFT's   Topamax [topiramate]    Cognitive slowing   Doxycycline Other (See Comments)   Thrush,dizziness      Medication List        Accurate as of 10/17/17  9:21 AM. Always use your most recent med list.          amitriptyline 50 MG tablet Commonly known as:  ELAVIL TAKE 1 TABLET BY MOUTH EVERY DAY   amLODipine 10 MG tablet Commonly known as:  NORVASC Take 1 tablet (10 mg total) by mouth daily.   aspirin EC 81 MG tablet Take 1 tablet (81 mg total) daily by mouth.   carvedilol 6.25 MG tablet Commonly known as:  COREG Take 1 tablet (6.25 mg total) by mouth 2 (two) times daily with a meal.   cholecalciferol 1000 units tablet Commonly known as:  VITAMIN D Take 1,000 Units by mouth daily.   CINNAMON PO Take 2,000 mg by  mouth daily.   donepezil 10 MG tablet Commonly known as:  ARICEPT Take 1 tablet (10 mg total) by mouth at bedtime.   esomeprazole 20 MG capsule Commonly known as:  NEXIUM Take 20 mg by mouth daily at 12 noon.   fenofibrate 160 MG tablet Take 1 tablet (160 mg total) by mouth daily.   fish oil-omega-3 fatty acids 1000 MG capsule Take 1 g by mouth daily.   gabapentin 600 MG tablet Commonly known as:  NEURONTIN TAKE 1 TABLET BY MOUTH TWICE A DAY   glimepiride 2 MG tablet Commonly known as:  AMARYL 1 tab in the morning and  1/2 tab in the evening.   glucose blood test strip DX:E11.9,  monitor fasting BG daily.   hydrochlorothiazide 25 MG tablet Commonly known as:  HYDRODIURIL Take 1 tablet (25 mg total) daily by mouth.   Iron 325 (65 Fe) MG Tabs Take 325 mg by mouth daily.   levothyroxine 100 MCG tablet Commonly known as:  SYNTHROID, LEVOTHROID Take 1 tablet (100 mcg total) daily by mouth.   losartan-hydrochlorothiazide 100-25 MG tablet Commonly known as:  HYZAAR TAKE 1 TABLET BY MOUTH EVERY DAY   metFORMIN 1000 MG tablet Commonly known as:  GLUCOPHAGE Take 1 tablet (1,000 mg total) 2 (two) times daily with a meal by mouth.   MULTIVITAMINS PO Take 1 tablet by mouth at bedtime.   nitroGLYCERIN 0.4 MG SL tablet Commonly known as:  NITROSTAT Place 1 tablet (0.4 mg total) under the tongue every 5 (five) minutes x 3 doses as needed for chest pain.   ondansetron 4 MG tablet Commonly known as:  ZOFRAN Take 1 tablet (4 mg total) by mouth every 8 (eight) hours as needed for nausea or vomiting.   prasugrel 10 MG Tabs tablet Commonly known as:  EFFIENT TAKE 1 TABLET BY MOUTH DAILY   rosuvastatin 10 MG tablet Commonly known as:  CRESTOR TAKE 1 TABLET BY MOUTH EVERY DAY   TECFIDERA 240 MG Cpdr Generic drug:  Dimethyl Fumarate TAKE 1 CAPSULE TWICE DAILY   venlafaxine XR 150 MG 24 hr capsule Commonly known as:  EFFEXOR-XR Take 1 capsule (150 mg total) by mouth  daily.       All past medical history, surgical history, allergies, family history, immunizations andmedications were updated in the EMR today and reviewed under the history and medication portions of their EMR.     ROS: Negative, with the exception of above mentioned in HPI   Objective:  BP 129/62 (BP Location: Right Arm, Patient Position: Sitting, Cuff Size: Large)   Pulse 74   Temp 98 F (36.7 C) (Oral)   Ht 5\' 6"  (1.676 m)   Wt 192 lb 6.4 oz (87.3 kg)   SpO2 99%   BMI 31.05 kg/m  Body mass index is 31.05 kg/m. Gen: Afebrile. No acute distress.  Nontoxic in appearance.  Well-nourished, well-developed, pleasant Caucasian female.  Obese. HENT: AT. [place]. MMM.  Eyes:Pupils Equal Round Reactive to light, Extraocular movements intact,  Conjunctiva without redness, discharge or icterus. Neck/lymp/endocrine: Supple, no lymphadenopathy, no thyromegaly CV: RRR 1/6 systolic murmur, no edema, +2/4 P posterior tibialis pulses Chest: CTAB, no wheeze or crackles Abd: Soft. NTND. BS present.  No masses palpated.  MSk/Skin: no rashes, purpura or petechiae. moderate size swelling left elbow, no erythema. Mild TTP.  Neuro: Normal gait. PERLA. EOMi. Alert. Oriented x3  Psych: Normal affect, dress and demeanor. Normal speech. Normal thought content and judgment.  No exam data present No results found. Results for orders placed or performed in visit on 10/17/17 (from the past 24 hour(s))  POCT HgB A1C     Status: Abnormal   Collection Time: 10/17/17  9:20 AM  Result Value Ref Range   Hemoglobin A1C 6.5     Assessment/Plan: KEELI ROBERG is a 62 y.o. female present for OV for  Diabetes mellitus without complication (HCC)/Morbid obesity - POCT HgB A1C:  6.5--> 7.2--> 6.1--> 6.0--> 6.4 --> 6.5 today .  - continue  Amaryl to 2 mg morning, 1 mg at night. Refills provided today. - continue metformin 1000 mg BID, refills provided.  - increase exercise regimen and continue dietary modification.   - PSV 23 given 10/2015, Prevnar  completed 11/15/2016 (completed) - foot exam 11/15/2016 - Eye exam 09/2016, Dr. Venetia Constable. Records requested--> yearly - Microalbumin: on an ARB - flu shot UTD 2018 - f/u 4 months  Hypertension/hyperlipidemia/Antiplatelet or antithrombotic long-term use/athersclerosis/hypokalemia:  -  Stable. - follows with cardiology, Dr. Johnsie Cancel who prescribed medications.  - low salt, increase exercise.  - Continue to monitor at home if above 140/90 will need to see sooner and discuss.  - added Kdur 10 meq/d - Continue fenofibrate  - continue statin.  - continue Effient - maintain routine cardiology follow ups.   Memory loss /depression/hypothyroid/subdural hematoma/MS:  - Stable. Doing well.  - TSH normal.  - Continue effexor to 150 mg.  - Continue Aricept; refills provided today. - Tecfidera prescribed by neurology.  Gabapentin prescribed by neurology. - amitriptyline typically prescribed by neuro, given increase night symptoms agreed to try very small dose increase. Prescribe 50 --> 75 mg .  Hepatitis A vaccine: - pt with upcoming travel. She would like HEP A vac #1 today. Dicussed this vaccine is 2 parts. Uncertain immunity level it will provided given her traveling in a few weeks. She would like vaccine anyway. Provided today.   Left olecranon bursitis:  - rather significant swelling. Discussed drainage. She is on a blood thinner. Would be willing to drain in office with procedure appt. She was advised she would need to monitor closely for bleeding or hematoma formation.  - other option is to be seen by ortho.   Reviewed expectations re: course of current medical issues.  Discussed self-management of symptoms.  Outlined signs and symptoms indicating need for more acute intervention.  Patient verbalized understanding and all questions were answered.  Patient received an After-Visit Summary.    Orders Placed This Encounter  Procedures  . POCT HgB  A1C   Greater than 40 minutes spent with patient, >50% of time spent face to face counseling and coordinating care.   Note is dictated utilizing voice recognition software. Although note has been proof read prior to signing, occasional typographical errors still can be missed. If any questions arise, please do not hesitate to call for verification.   electronically signed by:  Howard Pouch, DO  [place]

## 2017-10-17 NOTE — Patient Instructions (Signed)
I have refilled your medications. Increasing amitriptyline to 75 mg, make sure you pick the new dose. Start potassium supplement prescribed once a day.  Get out and exercise.  Increase exercise.   Followup in 4 months.

## 2017-10-21 ENCOUNTER — Encounter: Payer: Self-pay | Admitting: Family Medicine

## 2017-10-21 ENCOUNTER — Ambulatory Visit (INDEPENDENT_AMBULATORY_CARE_PROVIDER_SITE_OTHER): Payer: 59 | Admitting: Family Medicine

## 2017-10-21 VITALS — BP 134/78 | HR 74 | Temp 98.0°F | Resp 20 | Ht 66.0 in | Wt 191.0 lb

## 2017-10-21 DIAGNOSIS — M7022 Olecranon bursitis, left elbow: Secondary | ICD-10-CM | POA: Diagnosis not present

## 2017-10-21 MED ORDER — CEPHALEXIN 500 MG PO CAPS: 500.0000 mg | ORAL_CAPSULE | Freq: Three times a day (TID) | ORAL | 0 refills | Status: DC

## 2017-10-21 NOTE — Progress Notes (Signed)
Daisy Bennett , 01-Oct-1955, 62 y.o., female MRN: [id number] Patient Care Team    Relationship Specialty Notifications Start End  Ma Hillock, DO PCP - General Family Medicine  03/10/15   Devra Dopp, MD Referring Physician Dermatology  02/10/16   Kathrynn Ducking, MD Consulting Physician Neurology  05/18/16   Linda Hedges, [place] Physician Obstetrics and Gynecology  06/04/16   Anell Barr, OD  Optometry  08/17/16     Chief Complaint  Patient presents with  . Joint Swelling    left side     Subjective: Pt presents for an OV for scheduled I&D of olecranon bursitis. No erythema, drainage or fever. Area is becoming larger and uncomfortable. She reports she though about potential cause after last appt and realized she rest that elbow on the corner of her couch.   Depression screen [facility] 2/9 10/17/2017 06/17/2017 06/04/2016  Decreased Interest 0 0 0  Down, Depressed, Hopeless 0 0 0  PHQ - 2 Score 0 0 0  Altered sleeping 2 - -  Tired, decreased energy 0 - -  Change in appetite 1 - -  Feeling bad or failure about yourself  0 - -  Trouble concentrating 0 - -  Moving slowly or fidgety/restless 0 - -  Suicidal thoughts 0 - -  PHQ-9 Score 3 - -    Allergies  Allergen Reactions  . Betaseron [Interferon Beta-1b] Other (See Comments)    Increased LFT's  . Topamax [Topiramate]     Cognitive slowing  . Doxycycline Other (See Comments)    Thrush,dizziness   Social History   Tobacco Use  . Smoking status: Former Smoker    Packs/day: 1.00    Last attempt to quit: 12/16/2015    Years since quitting: 1.8  . Smokeless tobacco: Never Used  Substance Use Topics  . Alcohol use: Yes    Alcohol/week: 0.0 oz    Comment: OCCASIONAL ALCOHOL USE.   Past Medical History:  Diagnosis Date  . Basal cell carcinoma   . Brain bleed ([place])   . Cancer ([place])    basal and squamous cell skin cancer  . DM (diabetes mellitus) ([place])   . Goiter, unspecified   . History of  colonic polyps 2012  . Multiple sclerosis ([place])   . Non-ST elevated myocardial infarction (non-STEMI) ([place])   . SCCA (squamous cell carcinoma) of skin    right shin  . SEMI (subendocardial myocardial infarction) ([place]) 09/29/09   TOTAL RCA WITH STENT TO OM BRANCH  . Shingles   . Squamous cell carcinoma   . Tobacco abuse   . Unspecified essential hypertension   . Unspecified hypothyroidism    Past Surgical History:  Procedure Laterality Date  . CATARACT SURGERY  12/10  . CESAREAN SECTION    . CORONARY ANGIOPLASTY WITH STENT PLACEMENT    . TONSILLECTOMY    . VESICOVAGINAL FISTULA CLOSURE W/ TAH     Family History  Problem Relation Age of Onset  . Hypertension Mother   . Heart failure Father   . Aneurysm Brother        THORACIC/ABD ANEURYSM  . Prostate cancer Brother 75  . Fibromyalgia Sister   . Brain cancer Maternal Grandmother 68  . Multiple sclerosis Neg Hx    Allergies as of 10/21/2017      Reactions   Betaseron [interferon Beta-1b] Other (See Comments)   Increased LFT's   Topamax [topiramate]    Cognitive slowing   Doxycycline Other (  See Comments)   Thrush,dizziness      Medication List        Accurate as of 10/21/17 12:02 PM. Always use your most recent med list.          amitriptyline 75 MG tablet Commonly known as:  ELAVIL Take 1 tablet (75 mg total) by mouth daily.   amLODipine 10 MG tablet Commonly known as:  NORVASC Take 1 tablet (10 mg total) by mouth daily.   aspirin EC 81 MG tablet Take 1 tablet (81 mg total) daily by mouth.   carvedilol 6.25 MG tablet Commonly known as:  COREG Take 1 tablet (6.25 mg total) by mouth 2 (two) times daily with a meal.   cephALEXin 500 MG capsule Commonly known as:  KEFLEX Take 1 capsule (500 mg total) by mouth 3 (three) times daily.   cholecalciferol 1000 units tablet Commonly known as:  VITAMIN D Take 1,000 Units by mouth daily.   donepezil 10 MG tablet Commonly known as:  ARICEPT Take 1 tablet (10 mg  total) by mouth at bedtime.   esomeprazole 20 MG capsule Commonly known as:  NEXIUM Take 20 mg by mouth daily at 12 noon.   fenofibrate 160 MG tablet Take 1 tablet (160 mg total) by mouth daily.   fish oil-omega-3 fatty acids 1000 MG capsule Take 1 g by mouth daily.   gabapentin 600 MG tablet Commonly known as:  NEURONTIN TAKE 1 TABLET BY MOUTH TWICE A DAY   glimepiride 2 MG tablet Commonly known as:  AMARYL 1 tab in the morning and  1/2 tab in the evening.   hydrochlorothiazide 25 MG tablet Commonly known as:  HYDRODIURIL Take 1 tablet (25 mg total) by mouth daily.   Iron 325 (65 Fe) MG Tabs Take 325 mg by mouth daily.   levothyroxine 100 MCG tablet Commonly known as:  SYNTHROID, LEVOTHROID Take 1 tablet (100 mcg total) daily by mouth.   losartan-hydrochlorothiazide 100-25 MG tablet Commonly known as:  HYZAAR TAKE 1 TABLET BY MOUTH EVERY DAY   metFORMIN 1000 MG tablet Commonly known as:  GLUCOPHAGE Take 1 tablet (1,000 mg total) by mouth 2 (two) times daily with a meal.   MULTIVITAMINS PO Take 1 tablet by mouth at bedtime.   nitroGLYCERIN 0.4 MG SL tablet Commonly known as:  NITROSTAT Place 1 tablet (0.4 mg total) under the tongue every 5 (five) minutes x 3 doses as needed for chest pain.   potassium chloride 10 MEQ tablet Commonly known as:  K-DUR Take 1 tablet (10 mEq total) by mouth daily.   prasugrel 10 MG Tabs tablet Commonly known as:  EFFIENT TAKE 1 TABLET BY MOUTH DAILY   rosuvastatin 10 MG tablet Commonly known as:  CRESTOR TAKE 1 TABLET BY MOUTH EVERY DAY   TECFIDERA 240 MG Cpdr Generic drug:  Dimethyl Fumarate TAKE 1 CAPSULE TWICE DAILY   venlafaxine XR 150 MG 24 hr capsule Commonly known as:  EFFEXOR-XR Take 1 capsule (150 mg total) by mouth daily.       All past medical history, surgical history, allergies, family history, immunizations andmedications were updated in the EMR today and reviewed under the history and medication portions  of their EMR.     ROS: Negative, with the exception of above mentioned in HPI   Objective:  BP 134/78 (BP Location: Right Arm, Patient Position: Sitting, Cuff Size: Large)   Pulse 74   Temp 98 F (36.7 C)   Resp 20   Ht 5\' 6"  (  1.676 m)   Wt 191 lb (86.6 kg)   SpO2 98%   BMI 30.83 kg/m  Body mass index is 30.83 kg/m. Gen: Afebrile. No acute distress. Nontoxic in appearance, well developed, well nourished.  MSK: no erythema, drainage. Mild TTP left elbow with moderate fluid collection at olecranon bursa.    No exam data present No results found. No results found for this or any previous visit (from the past 24 hour(s)).  Assessment/Plan: JOHANA HOPKINSON is a 62 y.o. female present for OV for  Olecranon bursitis of left elbow Informed consent obtained and placed in chart.  Time out performed.  Left elbow localized with  0.25 ML 1% lidocaine w/ epi using 25 g needle.  Area cleaned with iodine x 3 and wiped clear with alcohol swab.  Using 18 gauge needle, left elbow was aspirated/drained off 9.5 cc serosanguinous fluid.   Return precautions and after injection care provided to patient  Pt had the opportunity to ask questions, all questions answered Patient tolerated procedure very well. No complications.Pressure dressing applied and instruction given in AVS and in person for pt keep area clean and dry. Pressure dressing in place for 24 hours, then can remove and replace daily after shower for 3-4 days. Extra bleeding precautions with her since she is on blood thinner. Keflex TID 5d prescribed. Did not appear infectious, prophylaxis tx only given lx.   Reviewed expectations re: course of current medical issues.  Discussed self-management of symptoms.  Outlined signs and symptoms indicating need for more acute intervention.  Patient verbalized understanding and all questions were answered.  Patient received an After-Visit Summary.    No orders of the defined types were  placed in this encounter.    Note is dictated utilizing voice recognition software. Although note has been proof read prior to signing, occasional typographical errors still can be missed. If any questions arise, please do not hesitate to call for verification.   electronically signed by:  Howard Pouch, DO  [place]

## 2017-10-21 NOTE — Patient Instructions (Addendum)
Keep pressure dressing in place for 24 hours.  Then replace dressing daily for 3-4 days.  Watch for any signs of increase bleeding.  Take keflex every 8 hours for 5 days as prophylaxis.

## 2017-10-24 ENCOUNTER — Telehealth: Payer: Self-pay | Admitting: Family Medicine

## 2017-10-24 ENCOUNTER — Encounter: Payer: Self-pay | Admitting: Family Medicine

## 2017-10-24 NOTE — Telephone Encounter (Signed)
Pt reports her olecranon bursitis returned after having drained in the office Friday.  I would recommend she be seen by ortho. She also may need to wear and elbow compression sleeve until healed (which they will likely have in their office).

## 2017-10-24 NOTE — Telephone Encounter (Signed)
Poke with patient reviewed recommendation. Patient verbalized understanding and will schedule appt with orth.

## 2017-10-27 DIAGNOSIS — M7022 Olecranon bursitis, left elbow: Secondary | ICD-10-CM | POA: Diagnosis not present

## 2017-11-03 DIAGNOSIS — G35 Multiple sclerosis: Secondary | ICD-10-CM | POA: Diagnosis not present

## 2017-11-17 DIAGNOSIS — F0670 Mild neurocognitive disorder due to known physiological condition without behavioral disturbance: Secondary | ICD-10-CM | POA: Insufficient documentation

## 2017-11-17 DIAGNOSIS — G3184 Mild cognitive impairment, so stated: Secondary | ICD-10-CM | POA: Insufficient documentation

## 2017-11-21 DIAGNOSIS — G35 Multiple sclerosis: Secondary | ICD-10-CM | POA: Diagnosis not present

## 2017-12-07 ENCOUNTER — Other Ambulatory Visit: Payer: Self-pay | Admitting: Cardiovascular Disease

## 2017-12-07 ENCOUNTER — Other Ambulatory Visit: Payer: Self-pay | Admitting: Adult Health

## 2017-12-21 DIAGNOSIS — D485 Neoplasm of uncertain behavior of skin: Secondary | ICD-10-CM | POA: Diagnosis not present

## 2017-12-27 ENCOUNTER — Ambulatory Visit: Payer: 59 | Admitting: Neurology

## 2018-01-05 ENCOUNTER — Other Ambulatory Visit: Payer: Self-pay | Admitting: Neurology

## 2018-01-13 DIAGNOSIS — L570 Actinic keratosis: Secondary | ICD-10-CM | POA: Diagnosis not present

## 2018-01-13 DIAGNOSIS — C44729 Squamous cell carcinoma of skin of left lower limb, including hip: Secondary | ICD-10-CM | POA: Diagnosis not present

## 2018-01-25 ENCOUNTER — Ambulatory Visit: Payer: 59 | Admitting: Neurology

## 2018-01-25 ENCOUNTER — Telehealth: Payer: Self-pay | Admitting: *Deleted

## 2018-01-25 ENCOUNTER — Encounter: Payer: Self-pay | Admitting: Neurology

## 2018-01-25 VITALS — BP 130/66 | HR 72 | Ht 66.0 in | Wt 194.0 lb

## 2018-01-25 DIAGNOSIS — Z5181 Encounter for therapeutic drug level monitoring: Secondary | ICD-10-CM | POA: Diagnosis not present

## 2018-01-25 DIAGNOSIS — G35 Multiple sclerosis: Secondary | ICD-10-CM | POA: Diagnosis not present

## 2018-01-25 DIAGNOSIS — R413 Other amnesia: Secondary | ICD-10-CM

## 2018-01-25 MED ORDER — MODAFINIL 200 MG PO TABS: 200.0000 mg | ORAL_TABLET | Freq: Every day | ORAL | 3 refills | Status: DC

## 2018-01-25 NOTE — Patient Instructions (Signed)
We will try provigil for the cognitive issues.

## 2018-01-25 NOTE — Telephone Encounter (Addendum)
Completed PA modafinil 200mg  tab on covermymeds. Key: [id number].  Waiting on determination.   Dx: Fatigue associated with MS   PA approved effective 12/26/17-01/25/19. Case ID: [id number]

## 2018-01-25 NOTE — Progress Notes (Signed)
Reason for visit: Multiple sclerosis  Daisy Bennett is an 62 y.o. female  History of present illness:  Daisy Bennett is a 62 year old right-handed white female with a history of multiple sclerosis and diabetes.  The patient has done well since last seen, Daisy Bennett just recently had a squamous cell carcinoma resected from her left leg.  Daisy Bennett is recovering from this.  Daisy Bennett had another fall on 06 July 2017, once again bumping her head.  Daisy Bennett was carrying a large box and tripped over a bump on her driveway and fell.  The patient had a CT scan of the head that was unremarkable and the patient has not had any new symptoms of numbness, weakness, balance changes, or difficulty controlling the bowels or the bladder.  Daisy Bennett remains on Tecfidera, Daisy Bennett tolerates the drug well.  The patient continues to work but Daisy Bennett is having difficulty focusing, Daisy Bennett cannot learn new software.  Daisy Bennett is planning on retiring when Daisy Bennett turns 34.  The patient does have some fatigue, Daisy Bennett generally sleeps fairly well at night but her mind will race when Daisy Bennett goes to bed.  Daisy Bennett had neuropsychological testing done, this suggested minimum cognitive impairment, the use of Provigil was recommended.  The patient has gone off of her Aricept.  Past Medical History:  Diagnosis Date  . Basal cell carcinoma   . Brain bleed ([place])   . Cancer ([place])    basal and squamous cell skin cancer  . DM (diabetes mellitus) ([place])   . Goiter, unspecified   . History of colonic polyps 2012  . Multiple sclerosis ([place])   . Non-ST elevated myocardial infarction (non-STEMI) ([place])   . SCCA (squamous cell carcinoma) of skin    right shin  . SEMI (subendocardial myocardial infarction) ([place]) 09/29/09   TOTAL RCA WITH STENT TO OM BRANCH  . Shingles   . Squamous cell carcinoma   . Tobacco abuse   . Unspecified essential hypertension   . Unspecified hypothyroidism     Past Surgical History:  Procedure Laterality Date  . CATARACT SURGERY  12/10  . CESAREAN SECTION      . CORONARY ANGIOPLASTY WITH STENT PLACEMENT    . TONSILLECTOMY    . VESICOVAGINAL FISTULA CLOSURE W/ TAH      Family History  Problem Relation Age of Onset  . Hypertension Mother   . Heart failure Father   . Aneurysm Brother        THORACIC/ABD ANEURYSM  . Prostate cancer Brother 25  . Fibromyalgia Sister   . Brain cancer Maternal Grandmother 54  . Multiple sclerosis Neg Hx     Social history:  reports that Daisy Bennett quit smoking about 2 years ago. Daisy Bennett smoked 1.00 pack per day. Daisy Bennett has never used smokeless tobacco. Daisy Bennett reports that Daisy Bennett drinks alcohol. Daisy Bennett reports that Daisy Bennett does not use drugs.    Allergies  Allergen Reactions  . Betaseron [Interferon Beta-1b] Other (See Comments)    Increased LFT's  . Topamax [Topiramate]     Cognitive slowing  . Doxycycline Other (See Comments)    Thrush,dizziness    Medications:  Prior to Admission medications   Medication Sig Start Date End Date Taking? Authorizing Provider  amitriptyline (ELAVIL) 75 MG tablet Take 1 tablet (75 mg total) by mouth daily. 10/17/17  Yes Kuneff, Renee A, DO  amLODipine (NORVASC) 10 MG tablet TAKE 1 TABLET BY MOUTH EVERY DAY 12/07/17  Yes Josue Hector, MD  aspirin EC 81 MG tablet Take 1 tablet (  81 mg total) daily by mouth. 06/09/17  Yes Josue Hector, MD  carvedilol (COREG) 6.25 MG tablet TAKE 1 TABLET (6.25 MG TOTAL) BY MOUTH 2 (TWO) TIMES DAILY WITH A MEAL. 12/07/17  Yes Josue Hector, MD  cholecalciferol (VITAMIN D) 1000 units tablet Take 1,000 Units by mouth daily.   Yes [provider]  esomeprazole (NEXIUM) 20 MG capsule Take 20 mg by mouth daily at 12 noon.   Yes [provider]  fenofibrate 160 MG tablet Take 1 tablet (160 mg total) by mouth daily. 10/17/17  Yes Kuneff, Renee A, DO  Ferrous Sulfate (IRON) 325 (65 FE) MG TABS Take 325 mg by mouth daily.    Yes [provider]  fish oil-omega-3 fatty acids 1000 MG capsule Take 1 g by mouth daily.    Yes [provider]   gabapentin (NEURONTIN) 600 MG tablet TAKE 1 TABLET BY MOUTH TWICE A DAY 06/27/17  Yes [place]ikan, Megan, NP  glimepiride (AMARYL) 2 MG tablet 1 tab in the morning and  1/2 tab in the evening. 10/17/17  Yes Kuneff, Renee A, DO  hydrochlorothiazide (HYDRODIURIL) 25 MG tablet Take 1 tablet (25 mg total) by mouth daily. 10/17/17  Yes Kuneff, Renee A, DO  levothyroxine (SYNTHROID, LEVOTHROID) 100 MCG tablet Take 1 tablet (100 mcg total) daily by mouth. 06/17/17  Yes Kuneff, Renee A, DO  losartan-hydrochlorothiazide (HYZAAR) 100-25 MG tablet TAKE 1 TABLET BY MOUTH EVERY DAY 10/13/17  Yes Josue Hector, MD  metFORMIN (GLUCOPHAGE) 1000 MG tablet Take 1 tablet (1,000 mg total) by mouth 2 (two) times daily with a meal. 10/17/17  Yes Kuneff, Renee A, DO  Multiple Vitamin (MULTIVITAMINS PO) Take 1 tablet by mouth at bedtime.    Yes [provider]  nitroGLYCERIN (NITROSTAT) 0.4 MG SL tablet Place 1 tablet (0.4 mg total) under the tongue every 5 (five) minutes x 3 doses as needed for chest pain. 11/17/16 07/19/18 Yes Josue Hector, MD  potassium chloride (K-DUR) 10 MEQ tablet Take 1 tablet (10 mEq total) by mouth daily. 10/17/17  Yes Kuneff, Renee A, DO  prasugrel (EFFIENT) 10 MG TABS tablet TAKE 1 TABLET BY MOUTH DAILY 10/13/17  Yes Josue Hector, MD  rosuvastatin (CRESTOR) 10 MG tablet TAKE 1 TABLET BY MOUTH EVERY DAY 06/27/17  Yes Josue Hector, MD  TECFIDERA 240 MG CPDR TAKE 1 CAPSULE TWICE A DAY 01/05/18  Yes Kathrynn Ducking, MD  venlafaxine XR (EFFEXOR-XR) 150 MG 24 hr capsule Take 1 capsule (150 mg total) by mouth daily. 10/17/17  Yes Kuneff, Renee A, DO  modafinil (PROVIGIL) 200 MG tablet Take 1 tablet (200 mg total) by mouth daily. 01/25/18   Kathrynn Ducking, MD    ROS:  Out of a complete 14 system review of symptoms, the patient complains only of the following symptoms, and all other reviewed systems are negative.  Bruising easily Memory problem  Blood pressure 130/66, pulse 72, height  5\' 6"  (1.676 m), weight 194 lb (88 kg).  Physical Exam  General: The patient is alert and cooperative at the time of the examination.  The patient is moderately obese.  Skin: No significant peripheral edema is noted.   Neurologic Exam  Mental status: The patient is alert and oriented x 3 at the time of the examination. The patient has apparent normal recent and remote memory, with an apparently normal attention span and concentration ability.   Cranial nerves: Facial symmetry is present. Speech is normal, no aphasia  or dysarthria is noted. Extraocular movements are full. Visual fields are full.  Pupils are equal, round, and reactive to light.  Discs are flat bilaterally.  Motor: The patient has good strength in all 4 extremities.  Sensory examination: Soft touch sensation is symmetric on the face, arms, and legs.  Coordination: The patient has good finger-nose-finger and heel-to-shin bilaterally.  Gait and station: The patient has a normal gait. Tandem gait is slightly unsteady. Romberg is positive, the patient falls backwards. No drift is seen.  Reflexes: Deep tendon reflexes are symmetric.   Assessment/Plan:  1.  Multiple sclerosis  2.  Mild cognitive impairment  The patient will be given a prescription for Provigil starting 200 mg in the morning.  Daisy Bennett will continue the Tecfidera, Daisy Bennett will have blood work done today.  The patient will follow-up in 6 months.  The patient prefers to be followed through the physician only.  Jill Alexanders MD 01/25/2018 8:24 AM  [facility] [address] [place], [place] [address]  Phone [phone] Fax [phone]

## 2018-01-26 LAB — CBC WITH DIFFERENTIAL/PLATELET
Basophils Absolute: 0 10*3/uL (ref 0.0–0.2)
Basos: 0 %
EOS (ABSOLUTE): 0.4 10*3/uL (ref 0.0–0.4)
Eos: 6 %
Hematocrit: 36.5 % (ref 34.0–46.6)
Hemoglobin: 12.5 g/dL (ref 11.1–15.9)
Immature Grans (Abs): 0 10*3/uL (ref 0.0–0.1)
Immature Granulocytes: 0 %
Lymphocytes Absolute: 0.5 10*3/uL — ABNORMAL LOW (ref 0.7–3.1)
Lymphs: 8 %
MCH: 32.3 pg (ref 26.6–33.0)
MCHC: 34.2 g/dL (ref 31.5–35.7)
MCV: 94 fL (ref 79–97)
Monocytes Absolute: 0.6 10*3/uL (ref 0.1–0.9)
Monocytes: 9 %
Neutrophils Absolute: 5.3 10*3/uL (ref 1.4–7.0)
Neutrophils: 77 %
Platelets: 209 10*3/uL (ref 150–450)
RBC: 3.87 x10E6/uL (ref 3.77–5.28)
RDW: 14.1 % (ref 12.3–15.4)
WBC: 6.9 10*3/uL (ref 3.4–10.8)

## 2018-01-26 LAB — COMPREHENSIVE METABOLIC PANEL
ALT: 20 IU/L (ref 0–32)
AST: 18 IU/L (ref 0–40)
Albumin/Globulin Ratio: 1.7 (ref 1.2–2.2)
Albumin: 4.4 g/dL (ref 3.6–4.8)
Alkaline Phosphatase: 68 IU/L (ref 39–117)
BUN/Creatinine Ratio: 13 (ref 12–28)
BUN: 11 mg/dL (ref 8–27)
Bilirubin Total: <0.2 mg/dL (ref 0.0–1.2)
CO2: 24 mmol/L (ref 20–29)
Calcium: 10.3 mg/dL (ref 8.7–10.3)
Chloride: 100 mmol/L (ref 96–106)
Creatinine, Ser: 0.83 mg/dL (ref 0.57–1.00)
GFR calc Af Amer: 87 mL/min/{1.73_m2} (ref >59)
GFR calc non Af Amer: 75 mL/min/{1.73_m2} (ref >59)
Globulin, Total: 2.6 g/dL (ref 1.5–4.5)
Glucose: 185 mg/dL — ABNORMAL HIGH (ref 65–99)
Potassium: 3.8 mmol/L (ref 3.5–5.2)
Sodium: 140 mmol/L (ref 134–144)
Total Protein: 7 g/dL (ref 6.0–8.5)

## 2018-02-15 ENCOUNTER — Encounter: Payer: Self-pay | Admitting: Family Medicine

## 2018-02-15 ENCOUNTER — Ambulatory Visit: Payer: 59 | Admitting: Family Medicine

## 2018-02-15 VITALS — BP 134/70 | HR 75 | Temp 98.6°F | Resp 20 | Ht 66.0 in | Wt 197.0 lb

## 2018-02-15 DIAGNOSIS — G35 Multiple sclerosis: Secondary | ICD-10-CM

## 2018-02-15 DIAGNOSIS — I10 Essential (primary) hypertension: Secondary | ICD-10-CM

## 2018-02-15 DIAGNOSIS — Z7902 Long term (current) use of antithrombotics/antiplatelets: Secondary | ICD-10-CM

## 2018-02-15 DIAGNOSIS — S065X9A Traumatic subdural hemorrhage with loss of consciousness of unspecified duration, initial encounter: Secondary | ICD-10-CM

## 2018-02-15 DIAGNOSIS — S065XAA Traumatic subdural hemorrhage with loss of consciousness status unknown, initial encounter: Secondary | ICD-10-CM

## 2018-02-15 DIAGNOSIS — E039 Hypothyroidism, unspecified: Secondary | ICD-10-CM

## 2018-02-15 DIAGNOSIS — E119 Type 2 diabetes mellitus without complications: Secondary | ICD-10-CM | POA: Diagnosis not present

## 2018-02-15 DIAGNOSIS — R413 Other amnesia: Secondary | ICD-10-CM

## 2018-02-15 DIAGNOSIS — E782 Mixed hyperlipidemia: Secondary | ICD-10-CM

## 2018-02-15 LAB — POCT GLYCOSYLATED HEMOGLOBIN (HGB A1C): HbA1c, POC (controlled diabetic range): 6.4 % (ref 0.0–7.0)

## 2018-02-15 MED ORDER — AMITRIPTYLINE HCL 75 MG PO TABS: 75.0000 mg | ORAL_TABLET | Freq: Every day | ORAL | 1 refills | Status: DC

## 2018-02-15 MED ORDER — VENLAFAXINE HCL ER 75 MG PO CP24: 225.0000 mg | ORAL_CAPSULE | Freq: Every day | ORAL | 1 refills | Status: DC

## 2018-02-15 MED ORDER — METFORMIN HCL 1000 MG PO TABS: 1000.0000 mg | ORAL_TABLET | Freq: Two times a day (BID) | ORAL | 5 refills | Status: DC

## 2018-02-15 MED ORDER — FENOFIBRATE 160 MG PO TABS: 160.0000 mg | ORAL_TABLET | Freq: Every day | ORAL | 1 refills | Status: DC

## 2018-02-15 MED ORDER — GLIMEPIRIDE 2 MG PO TABS: ORAL_TABLET | ORAL | 1 refills | Status: DC

## 2018-02-15 MED ORDER — HYDROCHLOROTHIAZIDE 25 MG PO TABS: 25.0000 mg | ORAL_TABLET | Freq: Every day | ORAL | 0 refills | Status: DC

## 2018-02-15 NOTE — Patient Instructions (Signed)
Effexor dose increased, the new capsules are 75 mg each, you are to take 3 a day. Total daily dose is 225 mg.   Your diabetes looks great. I have refilled your med.  You look good.   Followup in 3 - 4 months on chronic medical conditions, sooner if needed .  Please help [place] help you:  We are honored you have chosen [facility] for your Primary Care home. Below you will find basic instructions that you may need to access in the future. Please help [place] help you by reading the instructions, which cover many of the frequent questions we experience.   Prescription refills and request:  -In order to allow more efficient response time, please call your pharmacy for all refills. They will forward the request electronically to [place]. This allows for the quickest possible response. Request left on a nurse line can take longer to refill, since these are checked as time allows between office patients and other phone calls.  - refill request can take up to 3-5 working days to complete.  - If request is sent electronically and request is appropiate, it is usually completed in 1-2 business days.  - all patients will need to be seen routinely for all chronic medical conditions requiring prescription medications (see follow-up below). If you are overdue for follow up on your condition, you will be asked to make an appointment and we will call in enough medication to cover you until your appointment (up to 30 days).  - all controlled substances will require a face to face visit to request/refill.  - if you desire your prescriptions to go through a new pharmacy, and have an active script at original pharmacy, you will need to call your pharmacy and have scripts transferred to new pharmacy. This is completed between the pharmacy locations and not by your provider.    Results: If any images or labs were ordered, it can take up to 1 week to get results depending on the test ordered and the lab/facility running and  resulting the test. - Normal or stable results, which do not need further discussion, may be released to your mychart immediately with attached note to you. A call may not be generated for normal results. Please make certain to sign up for mychart. If you have questions on how to activate your mychart you can call the front office.  - If your results need further discussion, our office will attempt to contact you via phone, and if unable to reach you after 2 attempts, we will release your abnormal result to your mychart with instructions.  - All results will be automatically released in mychart after 1 week.  - Your provider will provide you with explanation and instruction on all relevant material in your results. Please keep in mind, results and labs may appear confusing or abnormal to the untrained eye, but it does not mean they are actually abnormal for you personally. If you have any questions about your results that are not covered, or you desire more detailed explanation than what was provided, you should make an appointment with your provider to do so.   Our office handles many outgoing and incoming calls daily. If we have not contacted you within 1 week about your results, please check your mychart to see if there is a message first and if not, then contact our office.  In helping with this matter, you help decrease call volume, and therefore allow [place] to be able to  respond to patients needs more efficiently.   Acute office visits (sick visit):  An acute visit is intended for a new problem and are scheduled in shorter time slots to allow schedule openings for patients with new problems. This is the appropriate visit to discuss a new problem. Problems will not be addressed by phone call or Echart message. Appointment is needed if requesting treatment. In order to provide you with excellent quality medical care with proper time for you to explain your problem, have an exam and receive treatment with  instructions, these appointments should be limited to one new problem per visit. If you experience a new problem, in which you desire to be addressed, please make an acute office visit, we save openings on the schedule to accommodate you. Please do not save your new problem for any other type of visit, let us take care of it properly and quickly for you.   Follow up visits:  Depending on your condition(s) your provider will need to see you routinely in order to provide you with quality care and prescribe medication(s). Most chronic conditions (Example: hypertension, Diabetes, depression/anxiety... etc), require visits a couple times a year. Your provider will instruct you on proper follow up for your personal medical conditions and history. Please make certain to make follow up appointments for your condition as instructed. Failing to do so could result in lapse in your medication treatment/refills. If you request a refill, and are overdue to be seen on a condition, we will always provide you with a 30 day script (once) to allow you time to schedule.    Medicare wellness (well visit): - we have a wonderful Nurse Maudie Mercury), that will meet with you and provide you will yearly medicare wellness visits. These visits should occur yearly (can not be scheduled less than 1 calendar year apart) and cover preventive health, immunizations, advance directives and screenings you are entitled to yearly through your medicare benefits. Do not miss out on your entitled benefits, this is when medicare will pay for these benefits to be ordered for you.  These are strongly encouraged by your provider and is the appropriate type of visit to make certain you are up to date with all preventive health benefits. If you have not had your medicare wellness exam in the last 12 months, please make certain to schedule one by calling the office and schedule your medicare wellness with Maudie Mercury as soon as possible.   Yearly physical (well visit):   - Adults are recommended to be seen yearly for physicals. Check with your insurance and date of your last physical, most insurances require one calendar year between physicals. Physicals include all preventive health topics, screenings, medical exam and labs that are appropriate for gender/age and history. You may have fasting labs needed at this visit. This is a well visit (not a sick visit), new problems should not be covered during this visit (see acute visit).  - Pediatric patients are seen more frequently when they are younger. Your provider will advise you on well child visit timing that is appropriate for your their age. - This is not a medicare wellness visit. Medicare wellness exams do not have an exam portion to the visit. Some medicare companies allow for a physical, some do not allow a yearly physical. If your medicare allows a yearly physical you can schedule the medicare wellness with our nurse Maudie Mercury and have your physical with your provider after, on the same day. Please check with insurance for your  full benefits.   Late Policy/No Shows:  - all new patients should arrive 15-30 minutes earlier than appointment to allow [place] time  to  obtain all personal demographics,  insurance information and for you to complete office paperwork. - All established patients should arrive 10-15 minutes earlier than appointment time to update all information and be checked in .  - In our best efforts to run on time, if you are late for your appointment you will be asked to either reschedule or if able, we will work you back into the schedule. There will be a wait time to work you back in the schedule,  depending on availability.  - If you are unable to make it to your appointment as scheduled, please call 24 hours ahead of time to allow [place] to fill the time slot with someone else who needs to be seen. If you do not cancel your appointment ahead of time, you may be charged a no show fee.

## 2018-02-15 NOTE — Progress Notes (Signed)
Daisy Bennett , 1956-03-18, 62 y.o., female MRN: [id number] Patient Care Team    Relationship Specialty Notifications Start End  Ma Hillock, DO PCP - General Family Medicine  03/10/15   Devra Dopp, MD Referring Physician Dermatology  02/10/16   Kathrynn Ducking, MD Consulting Physician Neurology  05/18/16   Linda Hedges, Doyle Physician Obstetrics and Gynecology  06/04/16   Anell Barr, OD  Optometry  08/17/16     Chief Complaint  Patient presents with  . Diabetes  . Hypertension     Subjective:  Diabetes/morbid obesity: Patient presents for routine scheduled diabetes follow-up. Last A1c was  6.4.  Today she reports compliance with Amaryl  2 mg/1mg  daily and metformin 1000 mg BID and she is tolerating them. She is still  not checking her sugars.Patient denies dizziness, hyperglycemic or hypoglycemic events. Patient denies numbness, tingling in the extremities or nonhealing wounds of feet.    She is on gabapentin 600 mg BID. - POCT HgB A1C-->  6.5 --> 7.2--> 6.1 --> 6.0--> 6.4--> 6.5-->6.4 today - PSV 10/2016--> competed series -  foot exam 02/15/2018 - Eye exam 2/26//2018; Dr. Venetia Constable, yearly eye exams encouraged - Microalbumin: on an ARB - flu shot UTD 05/12/2016  Hypertension/hyperlipidemia: Today She reports compliance with her medication regimen of Norvasc 10 mg, Coreg 6.25mg  BID, HCTZ 25 mg QD, Losartan/HCTZ 100-25. H/O CAD, NSTEMI, post stent 2011 (OM)-collateralized RCA.  Pt is on BB, statin (low dose), ACE, fenofibrate, ASA 325.  She is on Effient.  She follows with Dr. Johnsie Cancel. BMP: 01/25/2018 within normal limits CBC: 01/25/2018 within normal limits Lipids: 06/21/2017 WNL, tg mildly elevated TSH: 06/21/2017 within normal limits Diet: low sodium Exercise: routine   Memory loss/dementia/s/p subdural hematoma/depression/MS:  She is doing okay.  Reports increased anxiety surrounding possibly being let go of her job come September.  There  are downsizing and her job is 1 of the wants to go.  Reports compliance with Effexor 150 mg daily, amitriptyline 75 mg.  She stopped the Aricept secondary to did not feel it was benefiting her, this was a trial to see if it was helpful.  Her neurologist has placed her on Provigil, she has not started this as of yet because she is worried about the medication.    Prior note:  Pt reports she is experiencing increased memory loss. Her family has showed concern and states she routinely forgets what she has said or done, as well as forgets to complete task. She is having difficulty at work and thinks she is in jeopardy of losing her job. She has a neurologist, and has seemed important issue. He was felt that it was likely from her multiple sclerosis medications. However since her trauma with subdural hematoma she has steadily declined in her memory loss. She has an upcoming appointment with her neurologist in 3-4 weeks. She also saw a neuropsychiatrist at [facility] for this issue, however it was just before she had fallen, and things have progressed quite significantly since that time. Patient reports she is scared, because she loses her job she will not have any benefits/health insurance and financial strain would be significant. She feels she is more depressed secondary to all these changes. She does have a lot of happiness in her new grandchild was recently born.  Depression screen [facility] 2/9 10/17/2017 06/17/2017 06/04/2016  Decreased Interest 0 0 0  Down, Depressed, Hopeless 0 0 0  PHQ - 2 Score 0 0 0  Altered sleeping 2 - -  Tired, decreased energy 0 - -  Change in appetite 1 - -  Feeling bad or failure about yourself  0 - -  Trouble concentrating 0 - -  Moving slowly or fidgety/restless 0 - -  Suicidal thoughts 0 - -  PHQ-9 Score 3 - -    Allergies  Allergen Reactions  . Betaseron [Interferon Beta-1b] Other (See Comments)    Increased LFT's  . Topamax [Topiramate]     Cognitive slowing  .  Doxycycline Other (See Comments)    Thrush,dizziness   Social History   Tobacco Use  . Smoking status: Former Smoker    Packs/day: 1.00    Last attempt to quit: 12/16/2015    Years since quitting: 2.1  . Smokeless tobacco: Never Used  Substance Use Topics  . Alcohol use: Yes    Alcohol/week: 0.0 oz    Comment: OCCASIONAL ALCOHOL USE.   Past Medical History:  Diagnosis Date  . Basal cell carcinoma   . Brain bleed ([place])   . Cancer ([place])    basal and squamous cell skin cancer  . DM (diabetes mellitus) ([place])   . Goiter, unspecified   . History of colonic polyps 2012  . Multiple sclerosis ([place])   . Non-ST elevated myocardial infarction (non-STEMI) ([place])   . SCCA (squamous cell carcinoma) of skin    right shin  . SEMI (subendocardial myocardial infarction) ([place]) 09/29/09   TOTAL RCA WITH STENT TO OM BRANCH  . Shingles   . Squamous cell carcinoma   . Tobacco abuse   . Unspecified essential hypertension   . Unspecified hypothyroidism    Past Surgical History:  Procedure Laterality Date  . CATARACT SURGERY  12/10  . CESAREAN SECTION    . CORONARY ANGIOPLASTY WITH STENT PLACEMENT    . TONSILLECTOMY    . VESICOVAGINAL FISTULA CLOSURE W/ TAH     Family History  Problem Relation Age of Onset  . Hypertension Mother   . Heart failure Father   . Aneurysm Brother        THORACIC/ABD ANEURYSM  . Prostate cancer Brother 18  . Fibromyalgia Sister   . Brain cancer Maternal Grandmother 1  . Multiple sclerosis Neg Hx    Allergies as of 02/15/2018      Reactions   Betaseron [interferon Beta-1b] Other (See Comments)   Increased LFT's   Topamax [topiramate]    Cognitive slowing   Doxycycline Other (See Comments)   Thrush,dizziness      Medication List        Accurate as of 02/15/18  9:51 AM. Always use your most recent med list.          amitriptyline 75 MG tablet Commonly known as:  ELAVIL Take 1 tablet (75 mg total) by mouth daily.   amLODipine 10 MG  tablet Commonly known as:  NORVASC TAKE 1 TABLET BY MOUTH EVERY DAY   aspirin EC 81 MG tablet Take 1 tablet (81 mg total) daily by mouth.   carvedilol 6.25 MG tablet Commonly known as:  COREG TAKE 1 TABLET (6.25 MG TOTAL) BY MOUTH 2 (TWO) TIMES DAILY WITH A MEAL.   cholecalciferol 1000 units tablet Commonly known as:  VITAMIN D Take 1,000 Units by mouth daily.   esomeprazole 20 MG capsule Commonly known as:  NEXIUM Take 20 mg by mouth daily at 12 noon.   fenofibrate 160 MG tablet Take 1 tablet (160 mg total) by mouth daily.   fish oil-omega-3  fatty acids 1000 MG capsule Take 1 g by mouth daily.   gabapentin 600 MG tablet Commonly known as:  NEURONTIN TAKE 1 TABLET BY MOUTH TWICE A DAY   glimepiride 2 MG tablet Commonly known as:  AMARYL 1 tab in the morning and  1/2 tab in the evening.   hydrochlorothiazide 25 MG tablet Commonly known as:  HYDRODIURIL Take 1 tablet (25 mg total) by mouth daily.   Iron 325 (65 Fe) MG Tabs Take 325 mg by mouth daily.   levothyroxine 100 MCG tablet Commonly known as:  SYNTHROID, LEVOTHROID Take 1 tablet (100 mcg total) daily by mouth.   losartan-hydrochlorothiazide 100-25 MG tablet Commonly known as:  HYZAAR TAKE 1 TABLET BY MOUTH EVERY DAY   metFORMIN 1000 MG tablet Commonly known as:  GLUCOPHAGE Take 1 tablet (1,000 mg total) by mouth 2 (two) times daily with a meal.   modafinil 200 MG tablet Commonly known as:  PROVIGIL Take 1 tablet (200 mg total) by mouth daily.   MULTIVITAMINS PO Take 1 tablet by mouth at bedtime.   nitroGLYCERIN 0.4 MG SL tablet Commonly known as:  NITROSTAT Place 1 tablet (0.4 mg total) under the tongue every 5 (five) minutes x 3 doses as needed for chest pain.   potassium chloride 10 MEQ tablet Commonly known as:  K-DUR Take 1 tablet (10 mEq total) by mouth daily.   prasugrel 10 MG Tabs tablet Commonly known as:  EFFIENT TAKE 1 TABLET BY MOUTH DAILY   rosuvastatin 10 MG tablet Commonly  known as:  CRESTOR TAKE 1 TABLET BY MOUTH EVERY DAY   TECFIDERA 240 MG Cpdr Generic drug:  Dimethyl Fumarate TAKE 1 CAPSULE TWICE A DAY   venlafaxine XR 150 MG 24 hr capsule Commonly known as:  EFFEXOR-XR Take 1 capsule (150 mg total) by mouth daily.       All past medical history, surgical history, allergies, family history, immunizations andmedications were updated in the EMR today and reviewed under the history and medication portions of their EMR.     ROS: Negative, with the exception of above mentioned in HPI   Objective:  BP 134/82 (BP Location: Right Arm, Patient Position: Sitting, Cuff Size: Large)   Pulse 75   Temp 98.6 F (37 C)   Resp 20   Ht 5\' 6"  (1.676 m)   Wt 197 lb (89.4 kg)   SpO2 100%   BMI 31.80 kg/m  Body mass index is 31.8 kg/m. Gen: Afebrile. No acute distress.  Pleasant, Caucasian female.  Obese. HENT: AT. [place].MMM. Eyes:Pupils Equal Round Reactive to light, Extraocular movements intact,  Conjunctiva without redness, discharge or icterus. Neck/lymp/endocrine: Supple, no lymphadenopathy, no thyromegaly CV: RRR 1/6, no edema, +2/4 P posterior tibialis pulses Chest: CTAB, no wheeze or crackles Abd: Soft.  Obese. NTND. BS present.  No masses palpated.  Skin: No rashes, purpura or petechiae.  Neuro:  Normal gait. PERLA. EOMi. Alert. Oriented x3 Psych: Normal affect, dress and demeanor. Normal speech. Normal thought content and judgment..  Diabetic Foot Exam - Simple   Simple Foot Form Diabetic Foot exam was performed with the following findings:  Yes 02/15/2018  5:54 PM  Visual Inspection No deformities, no ulcerations, no other skin breakdown bilaterally:  Yes Sensation Testing Intact to touch and monofilament testing bilaterally:  Yes Pulse Check Posterior Tibialis and Dorsalis pulse intact bilaterally:  Yes Comments      No exam data present No results found. Results for orders placed or performed in visit on  02/15/18 (from the past 24  hour(s))  POCT glycosylated hemoglobin (Hb A1C)     Status: Abnormal   Collection Time: 02/15/18  9:49 AM  Result Value Ref Range   Hemoglobin A1C  4.0 - 5.6 %   HbA1c POC (<> result, manual entry)  4.0 - 5.6 %   HbA1c, POC (prediabetic range)  5.7 - 6.4 %   HbA1c, POC (controlled diabetic range) 6.4 0.0 - 7.0 %    Assessment/Plan: HINDY PERRAULT is a 62 y.o. female present for OV for  Diabetes mellitus without complication (HCC)/Morbid obesity -Stable.  POCT HgB A1C:  6.5--> 7.2--> 6.1--> 6.0--> 6.4 --> 6.5--> 6.4 today .  -Continue Amaryl to 2 mg morning, 1 mg at night. Refills provided today. -Continue metformin 1000 mg BID, refills provided.  - increase exercise regimen and continue dietary modification.  - PNA series completed - foot exam 11/15/2016 - Eye exam 09/2016, Dr. Venetia Constable. Records requested--> yearly - Microalbumin: on an ARB - flu shot UTD 2018 - f/u 4 months  Hypertension/hyperlipidemia/Antiplatelet or antithrombotic long-term use/athersclerosis/hypokalemia:  - STable. - follows with cardiology, Dr. Johnsie Cancel who prescribes medications.  - low salt, increase exercise.  - Continue to monitor at home if above 140/90 will need to see sooner and discuss.  -  Kdur 10 meq/d - Continue fenofibrate  - continue statin.  - continue Effient - maintain routine cardiology follow ups.   Memory loss /depression/hypothyroid/subdural hematoma/MS:  -Stable.  Overall doing well, but would like to try to increase Effexor for little bit more coverage. - TSH normal.  -Increase Effexor from 150 mg to 225 mg daily. - neuro start on provigil --> she has not started--> encouraged her to try. - Tecfidera prescribed by neurology.  Gabapentin prescribed by neurology. -Continue amitriptyline 75 mg nightly  Routine follow-up every 4 months.    Reviewed expectations re: course of current medical issues.  Discussed self-management of symptoms.  Outlined signs and symptoms  indicating need for more acute intervention.  Patient verbalized understanding and all questions were answered.  Patient received an After-Visit Summary.    Orders Placed This Encounter  Procedures  . POCT glycosylated hemoglobin (Hb A1C)   Greater than 40 minutes spent with patient, >50% of time spent face to face counseling and coordinating care.   Note is dictated utilizing voice recognition software. Although note has been proof read prior to signing, occasional typographical errors still can be missed. If any questions arise, please do not hesitate to call for verification.   electronically signed by:  Howard Pouch, DO  [place]

## 2018-02-20 ENCOUNTER — Encounter: Payer: Self-pay | Admitting: Family Medicine

## 2018-03-16 DIAGNOSIS — L821 Other seborrheic keratosis: Secondary | ICD-10-CM | POA: Diagnosis not present

## 2018-03-16 DIAGNOSIS — L814 Other melanin hyperpigmentation: Secondary | ICD-10-CM | POA: Diagnosis not present

## 2018-03-16 DIAGNOSIS — D1801 Hemangioma of skin and subcutaneous tissue: Secondary | ICD-10-CM | POA: Diagnosis not present

## 2018-03-21 DIAGNOSIS — H401131 Primary open-angle glaucoma, bilateral, mild stage: Secondary | ICD-10-CM | POA: Diagnosis not present

## 2018-03-21 DIAGNOSIS — H43811 Vitreous degeneration, right eye: Secondary | ICD-10-CM | POA: Diagnosis not present

## 2018-03-21 DIAGNOSIS — E119 Type 2 diabetes mellitus without complications: Secondary | ICD-10-CM | POA: Diagnosis not present

## 2018-03-21 LAB — HM DIABETES EYE EXAM

## 2018-04-14 ENCOUNTER — Telehealth: Payer: Self-pay | Admitting: Cardiovascular Disease

## 2018-04-14 NOTE — Telephone Encounter (Signed)
New message  Patient has an appointment scheduled for 06/15/2018 with Dr. Johnsie Cancel but the patient would like to see if she can get worked into the schedule earlier because she may lose her health insurance the first part of November. Please call to discuss.

## 2018-04-14 NOTE — Telephone Encounter (Signed)
Made an appointment for 06/01/18 so patient can have appointment before her insurance ends.

## 2018-04-27 DIAGNOSIS — Z01419 Encounter for gynecological examination (general) (routine) without abnormal findings: Secondary | ICD-10-CM | POA: Diagnosis not present

## 2018-04-27 DIAGNOSIS — Z6830 Body mass index (BMI) 30.0-30.9, adult: Secondary | ICD-10-CM | POA: Diagnosis not present

## 2018-05-01 DIAGNOSIS — B078 Other viral warts: Secondary | ICD-10-CM | POA: Diagnosis not present

## 2018-05-11 DIAGNOSIS — S8002XD Contusion of left knee, subsequent encounter: Secondary | ICD-10-CM | POA: Diagnosis not present

## 2018-05-18 ENCOUNTER — Encounter: Payer: Self-pay | Admitting: Family Medicine

## 2018-05-18 ENCOUNTER — Other Ambulatory Visit: Payer: Self-pay | Admitting: Cardiovascular Disease

## 2018-05-18 ENCOUNTER — Ambulatory Visit: Payer: 59 | Admitting: Family Medicine

## 2018-05-18 ENCOUNTER — Other Ambulatory Visit: Payer: Self-pay | Admitting: Adult Health

## 2018-05-18 VITALS — BP 115/78 | HR 66 | Temp 98.0°F | Resp 20 | Ht 66.0 in | Wt 183.0 lb

## 2018-05-18 DIAGNOSIS — E782 Mixed hyperlipidemia: Secondary | ICD-10-CM | POA: Diagnosis not present

## 2018-05-18 DIAGNOSIS — E119 Type 2 diabetes mellitus without complications: Secondary | ICD-10-CM

## 2018-05-18 DIAGNOSIS — Z23 Encounter for immunization: Secondary | ICD-10-CM | POA: Diagnosis not present

## 2018-05-18 DIAGNOSIS — I10 Essential (primary) hypertension: Secondary | ICD-10-CM | POA: Diagnosis not present

## 2018-05-18 DIAGNOSIS — E039 Hypothyroidism, unspecified: Secondary | ICD-10-CM | POA: Diagnosis not present

## 2018-05-18 DIAGNOSIS — G35 Multiple sclerosis: Secondary | ICD-10-CM

## 2018-05-18 LAB — LIPID PANEL
Cholesterol: 128 mg/dL (ref 0–200)
HDL: 52.2 mg/dL (ref >39.00)
LDL Cholesterol: 45 mg/dL (ref 0–99)
NonHDL: 75.62
Total CHOL/HDL Ratio: 2
Triglycerides: 153 mg/dL — ABNORMAL HIGH (ref 0.0–149.0)
VLDL: 30.6 mg/dL (ref 0.0–40.0)

## 2018-05-18 LAB — TSH: TSH: 5.71 u[IU]/mL — ABNORMAL HIGH (ref 0.35–4.50)

## 2018-05-18 LAB — POCT GLYCOSYLATED HEMOGLOBIN (HGB A1C): Hemoglobin A1C: 6.3 % — AB (ref 4.0–5.6)

## 2018-05-18 MED ORDER — VENLAFAXINE HCL ER 75 MG PO CP24: 225.0000 mg | ORAL_CAPSULE | Freq: Every day | ORAL | 1 refills | Status: DC

## 2018-05-18 MED ORDER — HYDROCHLOROTHIAZIDE 25 MG PO TABS: 25.0000 mg | ORAL_TABLET | Freq: Every day | ORAL | 0 refills | Status: DC

## 2018-05-18 MED ORDER — AMITRIPTYLINE HCL 75 MG PO TABS: 75.0000 mg | ORAL_TABLET | Freq: Every day | ORAL | 1 refills | Status: DC

## 2018-05-18 MED ORDER — METFORMIN HCL 1000 MG PO TABS: 1000.0000 mg | ORAL_TABLET | Freq: Two times a day (BID) | ORAL | 5 refills | Status: DC

## 2018-05-18 MED ORDER — GLIMEPIRIDE 2 MG PO TABS: ORAL_TABLET | ORAL | 1 refills | Status: DC

## 2018-05-18 NOTE — Progress Notes (Signed)
Daisy Bennett , [birth year], 62 y.o., female MRN: [id number] Patient Care Team    Relationship Specialty Notifications Start End  Ma Hillock, DO PCP - General Family Medicine  03/10/15   Devra Dopp, MD Referring Physician Dermatology  02/10/16   Kathrynn Ducking, MD Consulting Physician Neurology  05/18/16   Linda Hedges, [place] Physician Obstetrics and Gynecology  06/04/16   Anell Barr, OD  Optometry  08/17/16     Chief Complaint  Patient presents with  . Diabetes  . Hypertension     Subjective:  Diabetes/morbid obesity: Patient presents for routine scheduled diabetes follow-up. Last A1c was  6.4.  Today she reports compliance with Amaryl  2 mg/1mg  daily and metformin 1000 mg BID and she is tolerating them. She is still  not checking her sugars.Patient denies dizziness, hyperglycemic or hypoglycemic events. Patient denies numbness, tingling in the extremities or nonhealing wounds of feet.    She is on gabapentin 600 mg BID. - POCT HgB A1C-->  6.5 --> 7.2--> 6.1 --> 6.0--> 6.4--> 6.5-->6.4--> 6.3 today - PSV 10/2016--> competed series -  foot exam 02/15/2018 - Eye exam 2/26//2018; Dr. Venetia Constable, yearly eye exams encouraged - Microalbumin: on an ARB - flu shot UTD 05/2018 today  Hypertension/hyperlipidemia: Today She reports compliance with her medication regimen of Norvasc 10 mg, Coreg 6.25mg  BID, HCTZ 25 mg QD, Losartan/HCTZ 100-25. H/O CAD, NSTEMI, post stent 2011 (OM)-collateralized RCA.  Pt is on BB, statin (low dose), ACE, fenofibrate, ASA 325.  She is on Effient.  She follows with Dr. Johnsie Cancel. BMP: 01/25/2018 within normal limits CBC: 01/25/2018 within normal limits Lipids: 06/21/2017 WNL, tg mildly elevated TSH: 06/21/2017 within normal limits Diet: low sodium Exercise: routine  depression/MS:  Patient reports she is doing okay.  She is tolerating the Effexor at 225 mg daily.  Reports compliance with amitriptyline 75 mg nightly.  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  Feels her meds are working well for her.  She does admit to having some difficulty adjusting to not having a job.  Depression screen [facility] 2/9 02/15/2018 10/17/2017 06/17/2017 06/04/2016  Decreased Interest 0 0 0 0  Down, Depressed, Hopeless 0 0 0 0  PHQ - 2 Score 0 0 0 0  Altered sleeping - 2 - -  Tired, decreased energy - 0 - -  Change in appetite - 1 - -  Feeling bad or failure about yourself  - 0 - -  Trouble concentrating - 0 - -  Moving slowly or fidgety/restless - 0 - -  Suicidal thoughts - 0 - -  PHQ-9 Score - 3 - -    Allergies  Allergen Reactions  . Betaseron [Interferon Beta-1b] Other (See Comments)    Increased LFT's  . Topamax [Topiramate]     Cognitive slowing  . Doxycycline Other (See Comments)    Thrush,dizziness   Social History   Tobacco Use  . Smoking status: Former Smoker    Packs/day: 1.00    Last attempt to quit: 12/16/2015    Years since quitting: 2.4  . Smokeless tobacco: Never Used  Substance Use Topics  . Alcohol use: Yes    Alcohol/week: 0.0 standard drinks    Comment: OCCASIONAL ALCOHOL USE.   Past Medical History:  Diagnosis Date  . Basal cell carcinoma   . Brain bleed ([place])   . DM (diabetes mellitus) ([place])   . Goiter, unspecified   . History of colonic polyps  2012  . Multiple sclerosis ([place])   . Non-ST elevated myocardial infarction (non-STEMI) ([place])   . SCCA (squamous cell carcinoma) of skin    right shin  . SEMI (subendocardial myocardial infarction) ([place]) 09/29/09   TOTAL RCA WITH STENT TO OM BRANCH  . Shingles   . Squamous cell carcinoma 2019   lower ext  . Tobacco abuse   . Unspecified essential hypertension   . Unspecified hypothyroidism    Past Surgical History:  Procedure Laterality Date  . CATARACT SURGERY  12/10  . CESAREAN SECTION    . CORONARY ANGIOPLASTY WITH STENT PLACEMENT    . TONSILLECTOMY    . VESICOVAGINAL FISTULA CLOSURE W/ TAH     Family History  Problem Relation Age  of Onset  . Hypertension Mother   . Heart failure Father   . Aneurysm Brother        THORACIC/ABD ANEURYSM  . Prostate cancer Brother 34  . Fibromyalgia Sister   . Brain cancer Maternal Grandmother 30  . Multiple sclerosis Neg Hx    Allergies as of 05/18/2018      Reactions   Betaseron [interferon Beta-1b] Other (See Comments)   Increased LFT's   Topamax [topiramate]    Cognitive slowing   Doxycycline Other (See Comments)   Thrush,dizziness      Medication List        Accurate as of 05/18/18 10:00 AM. Always use your most recent med list.          amitriptyline 75 MG tablet Commonly known as:  ELAVIL Take 1 tablet (75 mg total) by mouth daily.   amLODipine 10 MG tablet Commonly known as:  NORVASC TAKE 1 TABLET BY MOUTH EVERY DAY   aspirin EC 81 MG tablet Take 1 tablet (81 mg total) daily by mouth.   carvedilol 6.25 MG tablet Commonly known as:  COREG TAKE 1 TABLET (6.25 MG TOTAL) BY MOUTH 2 (TWO) TIMES DAILY WITH A MEAL.   cholecalciferol 1000 units tablet Commonly known as:  VITAMIN D Take 1,000 Units by mouth daily.   esomeprazole 20 MG capsule Commonly known as:  NEXIUM Take 20 mg by mouth daily at 12 noon.   fenofibrate 160 MG tablet Take 1 tablet (160 mg total) by mouth daily.   fish oil-omega-3 fatty acids 1000 MG capsule Take 1 g by mouth daily.   gabapentin 600 MG tablet Commonly known as:  NEURONTIN TAKE 1 TABLET BY MOUTH TWICE A DAY   glimepiride 2 MG tablet Commonly known as:  AMARYL 1 tab in the morning and  1/2 tab in the evening.   hydrochlorothiazide 25 MG tablet Commonly known as:  HYDRODIURIL Take 1 tablet (25 mg total) by mouth daily.   Iron 325 (65 Fe) MG Tabs Take 325 mg by mouth daily.   levothyroxine 100 MCG tablet Commonly known as:  SYNTHROID, LEVOTHROID Take 1 tablet (100 mcg total) daily by mouth.   losartan-hydrochlorothiazide 100-25 MG tablet Commonly known as:  HYZAAR TAKE 1 TABLET BY MOUTH EVERY DAY     metFORMIN 1000 MG tablet Commonly known as:  GLUCOPHAGE Take 1 tablet (1,000 mg total) by mouth 2 (two) times daily with a meal.   MULTIVITAMINS PO Take 1 tablet by mouth at bedtime.   nitroGLYCERIN 0.4 MG SL tablet Commonly known as:  NITROSTAT Place 1 tablet (0.4 mg total) under the tongue every 5 (five) minutes x 3 doses as needed for chest pain.   NON FORMULARY CBD oil   potassium  chloride 10 MEQ tablet Commonly known as:  K-DUR Take 1 tablet (10 mEq total) by mouth daily.   prasugrel 10 MG Tabs tablet Commonly known as:  EFFIENT TAKE 1 TABLET BY MOUTH DAILY   rosuvastatin 10 MG tablet Commonly known as:  CRESTOR TAKE 1 TABLET BY MOUTH EVERY DAY   TECFIDERA 240 MG Cpdr Generic drug:  Dimethyl Fumarate TAKE 1 CAPSULE TWICE A DAY   venlafaxine XR 75 MG 24 hr capsule Commonly known as:  EFFEXOR-XR Take 3 capsules (225 mg total) by mouth daily with breakfast.       All past medical history, surgical history, allergies, family history, immunizations andmedications were updated in the EMR today and reviewed under the history and medication portions of their EMR.     ROS: Negative, with the exception of above mentioned in HPI   Objective:  BP 115/78 (BP Location: Left Arm, Patient Position: Sitting, Cuff Size: Large)   Pulse 66   Temp 98 F (36.7 C)   Resp 20   Ht 5\' 6"  (1.676 m)   Wt 183 lb (83 kg)   SpO2 100%   BMI 29.54 kg/m  Body mass index is 29.54 kg/m. Gen: Afebrile. No acute distress.  Nontoxic.  Pleasant overweight female. HENT: AT. [place].  MMM.  Eyes:Pupils Equal Round Reactive to light, Extraocular movements intact,  Conjunctiva without redness, discharge or icterus. Neck/lymp/endocrine: Supple, no lymphadenopathy, no thyromegaly CV: RRR 1/6 SM, no edema, +2/4 P posterior tibialis pulses Chest: CTAB, no wheeze or crackles Abd: Soft. NTND. BS present.  No masses palpated.  Neuro:  Normal gait. PERLA. EOMi. Alert. Oriented x3 Psych: Normal affect,  dress and demeanor. Normal speech. Normal thought content and judgment.  No exam data present No results found. No results found for this or any previous visit (from the past 24 hour(s)).  Assessment/Plan: MACEE VENABLES is a 62 y.o. female present for OV for  Diabetes mellitus without complication (HCC)/Morbid obesity -Stable. -Continue Amaryl to 2 mg morning, 1 mg at night and metformin 1000 mg BID.  Refills provided today. - increase exercise regimen and continue dietary modification.  Doing well and losing weight. - POCT HgB A1C-->  6.5 --> 7.2--> 6.1 --> 6.0--> 6.4--> 6.5-->6.4--> 6.3 today - PSV 10/2016--> competed series -  foot exam 02/15/2018 - Eye exam reports eye exam within the last few months with  Dr. Venetia Constable, yearly eye exams encouraged - Microalbumin: on an ARB - flu shot UTD 05/2018 today  Hypertension/hyperlipidemia/Antiplatelet or antithrombotic long-term use/athersclerosis/hypokalemia:  - Stable. - follows with cardiology, Dr. Johnsie Cancel who prescribes medications.  - low salt, increase exercise.  - Continue to monitor at home if above 140/90 will need to see sooner and discuss.  -  Kdur 10 meq/d - Continue fenofibrate  - continue statin.  - continue Effient - maintain routine cardiology follow ups.   depression/hypothyroid/subdural hematoma/MS:  - Stable. Tolerating effexor 225 mg QD. She has lost some weight with use. - TSH normal.  - Tecfidera prescribed by neurology.  Gabapentin prescribed by neurology. -Continue amitriptyline 75 mg nightly, refills provided.  Flu shot provided today.  Routine follow-up every 4 months.    Reviewed expectations re: course of current medical issues.  Discussed self-management of symptoms.  Outlined signs and symptoms indicating need for more acute intervention.  Patient verbalized understanding and all questions were answered.  Patient received an After-Visit Summary.    Orders Placed This Encounter    Procedures  . Flu Vaccine QUAD  6+ mos PF IM (Fluarix Quad PF)  . POCT glycosylated hemoglobin (Hb A1C)   > 25 minutes spent with patient, >50% of time spent face to face    Note is dictated utilizing voice recognition software. Although note has been proof read prior to signing, occasional typographical errors still can be missed. If any questions arise, please do not hesitate to call for verification.   electronically signed by:  Howard Pouch, DO  [place]

## 2018-05-18 NOTE — Patient Instructions (Signed)
Follow up in 4 months on diabetes and hypertension.  We will call with labs once completed. I called in your meds for you.   Your diabetes is well controlled at 6.3!   Please help [place] help you:  We are honored you have chosen [facility] for your Primary Care home. Below you will find basic instructions that you may need to access in the future. Please help [place] help you by reading the instructions, which cover many of the frequent questions we experience.   Prescription refills and request:  -In order to allow more efficient response time, please call your pharmacy for all refills. They will forward the request electronically to [place]. This allows for the quickest possible response. Request left on a nurse line can take longer to refill, since these are checked as time allows between office patients and other phone calls.  - refill request can take up to 3-5 working days to complete.  - If request is sent electronically and request is appropiate, it is usually completed in 1-2 business days.  - all patients will need to be seen routinely for all chronic medical conditions requiring prescription medications (see follow-up below). If you are overdue for follow up on your condition, you will be asked to make an appointment and we will call in enough medication to cover you until your appointment (up to 30 days).  - all controlled substances will require a face to face visit to request/refill.  - if you desire your prescriptions to go through a new pharmacy, and have an active script at original pharmacy, you will need to call your pharmacy and have scripts transferred to new pharmacy. This is completed between the pharmacy locations and not by your provider.    Results: If any images or labs were ordered, it can take up to 1 week to get results depending on the test ordered and the lab/facility running and resulting the test. - Normal or stable results, which do not need further discussion, may be  released to your mychart immediately with attached note to you. A call may not be generated for normal results. Please make certain to sign up for mychart. If you have questions on how to activate your mychart you can call the front office.  - If your results need further discussion, our office will attempt to contact you via phone, and if unable to reach you after 2 attempts, we will release your abnormal result to your mychart with instructions.  - All results will be automatically released in mychart after 1 week.  - Your provider will provide you with explanation and instruction on all relevant material in your results. Please keep in mind, results and labs may appear confusing or abnormal to the untrained eye, but it does not mean they are actually abnormal for you personally. If you have any questions about your results that are not covered, or you desire more detailed explanation than what was provided, you should make an appointment with your provider to do so.   Our office handles many outgoing and incoming calls daily. If we have not contacted you within 1 week about your results, please check your mychart to see if there is a message first and if not, then contact our office.  In helping with this matter, you help decrease call volume, and therefore allow [place] to be able to respond to patients needs more efficiently.   Acute office visits (sick visit):  An acute visit is intended for  a new problem and are scheduled in shorter time slots to allow schedule openings for patients with new problems. This is the appropriate visit to discuss a new problem. Problems will not be addressed by phone call or Echart message. Appointment is needed if requesting treatment. In order to provide you with excellent quality medical care with proper time for you to explain your problem, have an exam and receive treatment with instructions, these appointments should be limited to one new problem per visit. If you  experience a new problem, in which you desire to be addressed, please make an acute office visit, we save openings on the schedule to accommodate you. Please do not save your new problem for any other type of visit, let us take care of it properly and quickly for you.   Follow up visits:  Depending on your condition(s) your provider will need to see you routinely in order to provide you with quality care and prescribe medication(s). Most chronic conditions (Example: hypertension, Diabetes, depression/anxiety... etc), require visits a couple times a year. Your provider will instruct you on proper follow up for your personal medical conditions and history. Please make certain to make follow up appointments for your condition as instructed. Failing to do so could result in lapse in your medication treatment/refills. If you request a refill, and are overdue to be seen on a condition, we will always provide you with a 30 day script (once) to allow you time to schedule.    Medicare wellness (well visit): - we have a wonderful Nurse Maudie Mercury), that will meet with you and provide you will yearly medicare wellness visits. These visits should occur yearly (can not be scheduled less than 1 calendar year apart) and cover preventive health, immunizations, advance directives and screenings you are entitled to yearly through your medicare benefits. Do not miss out on your entitled benefits, this is when medicare will pay for these benefits to be ordered for you.  These are strongly encouraged by your provider and is the appropriate type of visit to make certain you are up to date with all preventive health benefits. If you have not had your medicare wellness exam in the last 12 months, please make certain to schedule one by calling the office and schedule your medicare wellness with Maudie Mercury as soon as possible.   Yearly physical (well visit):  - Adults are recommended to be seen yearly for physicals. Check with your insurance  and date of your last physical, most insurances require one calendar year between physicals. Physicals include all preventive health topics, screenings, medical exam and labs that are appropriate for gender/age and history. You may have fasting labs needed at this visit. This is a well visit (not a sick visit), new problems should not be covered during this visit (see acute visit).  - Pediatric patients are seen more frequently when they are younger. Your provider will advise you on well child visit timing that is appropriate for your their age. - This is not a medicare wellness visit. Medicare wellness exams do not have an exam portion to the visit. Some medicare companies allow for a physical, some do not allow a yearly physical. If your medicare allows a yearly physical you can schedule the medicare wellness with our nurse Maudie Mercury and have your physical with your provider after, on the same day. Please check with insurance for your full benefits.   Late Policy/No Shows:  - all new patients should arrive 15-30 minutes earlier than appointment to  allow [place] time  to  obtain all personal demographics,  insurance information and for you to complete office paperwork. - All established patients should arrive 10-15 minutes earlier than appointment time to update all information and be checked in .  - In our best efforts to run on time, if you are late for your appointment you will be asked to either reschedule or if able, we will work you back into the schedule. There will be a wait time to work you back in the schedule,  depending on availability.  - If you are unable to make it to your appointment as scheduled, please call 24 hours ahead of time to allow [place] to fill the time slot with someone else who needs to be seen. If you do not cancel your appointment ahead of time, you may be charged a no show fee.

## 2018-05-19 ENCOUNTER — Telehealth: Payer: Self-pay | Admitting: Family Medicine

## 2018-05-19 MED ORDER — LEVOTHYROXINE SODIUM 112 MCG PO TABS: 112.0000 ug | ORAL_TABLET | Freq: Every day | ORAL | 3 refills | Status: DC

## 2018-05-19 NOTE — Telephone Encounter (Signed)
Please inform patient the following information: Her labs look good, except her thyroid is a little under replaced. I have called in new script for her at the next higher dose. She can finish the bottle she has by taking one pill a day, except on Sunday (or pick a day) take 1.5 pills. Once she gets new script- then its one a day

## 2018-05-19 NOTE — Telephone Encounter (Signed)
Patient notified and verbalized understanding. 

## 2018-05-30 NOTE — Progress Notes (Signed)
Patient ID: LAZARIA SCHABEN, female   DOB: 08-14-1955, 62 y.o.   MRN: [id number]   Daisy Bennett is seen f/u CAD  post stenting 2011  She had a non ST elevation MI with subsequent stenting of an OM. She has diffuse 3VD with a total right that is collateralized. She had smoewhat atypical presentation with her DM. Marland KitchenLast myovue done 06/16/18 low risk non ischemic EF 68%  Lots of dermatology issues had multiple lesions removed from right leg and thigh  She has stopped smoking 2017   October 2017:  Fell and hit head with subdural Memory issues since then. 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 Will be having a new grand child in January  Unfortunately with stress is smoking a bit again  No chest pain needs new nitro     ROS: Denies fever, malais, weight loss, blurry vision, decreased visual acuity, cough, sputum, SOB, hemoptysis, pleuritic pain, palpitaitons, heartburn, abdominal pain, melena, lower extremity edema, claudication, or rash.  All other systems reviewed and negative  General: BP 132/68   Pulse 85   Ht 5\' 6"  (1.676 m)   Wt 185 lb 4 oz (84 kg)   SpO2 98%   BMI 29.90 kg/m  Affect appropriate Healthy:  appears stated age 1: normal Neck supple with no adenopathy JVP normal no bruits no thyromegaly Lungs clear with no wheezing and good diaphragmatic motion Heart:  S1/S2 no murmur, no rub, gallop or click PMI normal Abdomen: benighn, BS positve, no tenderness, no AAA no bruit.  No HSM or HJR Distal pulses intact with no bruits No edema Neuro non-focal Skin warm and dry No muscular weakness   Current Outpatient Medications  Medication Sig Dispense Refill  . amitriptyline (ELAVIL) 75 MG tablet Take 1 tablet (75 mg total) by mouth daily. 90 tablet 1  . amLODipine (NORVASC) 10 MG tablet Take 1 tablet (10 mg total) by mouth daily. Please keep upcoming  appt with Dr.Fabiano Ginley in October for future refills.Thank you 90 tablet 0  . aspirin EC 81 MG tablet Take 1 tablet (81 mg total) daily by mouth.    . carvedilol (COREG) 6.25 MG tablet Take 1 tablet (6.25 mg total) by mouth 2 (two) times daily. Please keep upcoming appt with Dr.Ksean Vale in October for future refills.Thank you 180 tablet 0  . cholecalciferol (VITAMIN D) 1000 units tablet Take 1,000 Units by mouth daily.    [name] esomeprazole (NEXIUM) 20 MG capsule Take 20 mg by mouth daily at 12 noon.    . fenofibrate 160 MG tablet Take 1 tablet (160 mg total) by mouth daily. 90 tablet 1  . Ferrous Sulfate (IRON) 325 (65 FE) MG TABS Take 325 mg by mouth daily.     . fish oil-omega-3 fatty acids 1000 MG capsule Take 1 g by mouth daily.     [name] gabapentin (NEURONTIN) 600 MG tablet TAKE 1 TABLET BY MOUTH TWICE A DAY 180 tablet 3  . glimepiride (AMARYL) 2 MG tablet 1 tab in the morning and  1/2 tab in the evening. 135 tablet 1  . hydrochlorothiazide (HYDRODIURIL) 25 MG tablet Take 1 tablet (25 mg total) by mouth daily. 90 tablet 0  . levothyroxine (SYNTHROID, LEVOTHROID) 112 MCG tablet Take 1 tablet (112 mcg total) by mouth daily. 90 tablet 3  . losartan-hydrochlorothiazide (HYZAAR) 100-25 MG tablet TAKE 1 TABLET BY MOUTH EVERY DAY 90  tablet 2  . metFORMIN (GLUCOPHAGE) 1000 MG tablet Take 1 tablet (1,000 mg total) by mouth 2 (two) times daily with a meal. 180 tablet 5  . Multiple Vitamin (MULTIVITAMINS PO) Take 1 tablet by mouth at bedtime.     . nitroGLYCERIN (NITROSTAT) 0.4 MG SL tablet Place 1 tablet (0.4 mg total) under the tongue every 5 (five) minutes x 3 doses as needed for chest pain. 25 tablet 4  . NON FORMULARY CBD oil    . potassium chloride (K-DUR) 10 MEQ tablet Take 1 tablet (10 mEq total) by mouth daily. 90 tablet 3  . prasugrel (EFFIENT) 10 MG TABS tablet TAKE 1 TABLET BY MOUTH DAILY 90 tablet 2  . rosuvastatin (CRESTOR) 10 MG tablet TAKE 1 TABLET BY MOUTH EVERY DAY 90 tablet 3  . TECFIDERA 240 MG  CPDR TAKE 1 CAPSULE TWICE A DAY 60 capsule 11  . venlafaxine XR (EFFEXOR XR) 75 MG 24 hr capsule Take 3 capsules (225 mg total) by mouth daily with breakfast. 270 capsule 1   No current facility-administered medications for this visit.    Facility-Administered Medications Ordered in Other Visits  Medication Dose Route Frequency Provider Last Rate Last Dose  . gadopentetate dimeglumine (MAGNEVIST) injection 18 mL  18 mL Intravenous Once PRN Kathrynn Ducking, MD        Allergies  Betaseron [interferon beta-1b]; Provigil [modafinil]; Topamax [topiramate]; and Doxycycline  Electrocardiogram:  03/14/13   SR normal rate 64   SR rate 67  Normal no change 09/18/14  01/13/16  SR rate 73  Nonspecific ST changes 06/09/17 SR rate 68 normal 06/01/18 SR rate 80 nonspecific ST changes   Assessment and Plan  CAD: Collateralized RCA and stent to OM in 2011 no angina continue medical Rx  New nitro called in Low risk myovue 06/16/17 normal perfusion no ischemia EF 68%  HTN: Well controlled.  Continue current medications and low sodium Dash type diet.   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

## 2018-06-01 ENCOUNTER — Ambulatory Visit: Payer: 59 | Admitting: Cardiovascular Disease

## 2018-06-01 ENCOUNTER — Encounter: Payer: Self-pay | Admitting: Cardiovascular Disease

## 2018-06-01 VITALS — BP 132/68 | HR 85 | Ht 66.0 in | Wt 185.2 lb

## 2018-06-01 DIAGNOSIS — I10 Essential (primary) hypertension: Secondary | ICD-10-CM | POA: Diagnosis not present

## 2018-06-01 DIAGNOSIS — I2583 Coronary atherosclerosis due to lipid rich plaque: Secondary | ICD-10-CM | POA: Diagnosis not present

## 2018-06-01 DIAGNOSIS — I2510 Atherosclerotic heart disease of native coronary artery without angina pectoris: Secondary | ICD-10-CM

## 2018-06-01 NOTE — Patient Instructions (Addendum)

## 2018-06-02 ENCOUNTER — Other Ambulatory Visit: Payer: Self-pay | Admitting: Adult Health

## 2018-06-02 ENCOUNTER — Other Ambulatory Visit: Payer: Self-pay | Admitting: Cardiovascular Disease

## 2018-06-05 DIAGNOSIS — M25362 Other instability, left knee: Secondary | ICD-10-CM | POA: Diagnosis not present

## 2018-06-05 DIAGNOSIS — M25562 Pain in left knee: Secondary | ICD-10-CM | POA: Diagnosis not present

## 2018-06-09 ENCOUNTER — Encounter: Payer: Self-pay | Admitting: Family Medicine

## 2018-06-15 ENCOUNTER — Ambulatory Visit: Payer: 59 | Admitting: Cardiovascular Disease

## 2018-06-29 ENCOUNTER — Other Ambulatory Visit: Payer: Self-pay | Admitting: Cardiovascular Disease

## 2018-07-11 ENCOUNTER — Other Ambulatory Visit: Payer: Self-pay | Admitting: Physician Assistant

## 2018-07-12 ENCOUNTER — Other Ambulatory Visit: Payer: Self-pay | Admitting: Physician Assistant

## 2018-07-12 DIAGNOSIS — M25562 Pain in left knee: Secondary | ICD-10-CM

## 2018-07-23 ENCOUNTER — Other Ambulatory Visit: Payer: 59

## 2018-07-23 ENCOUNTER — Ambulatory Visit
Admission: RE | Admit: 2018-07-23 | Discharge: 2018-07-23 | Disposition: A | Discharge status: Home / Self Care | Payer: 59 | Source: Ambulatory Visit | Attending: Physician Assistant | Admitting: Physician Assistant

## 2018-07-23 DIAGNOSIS — M25562 Pain in left knee: Secondary | ICD-10-CM

## 2018-07-28 ENCOUNTER — Other Ambulatory Visit: Payer: Self-pay | Admitting: Cardiovascular Disease

## 2018-08-10 ENCOUNTER — Ambulatory Visit
Admission: RE | Admit: 2018-08-10 | Discharge: 2018-08-10 | Disposition: A | Discharge status: Home / Self Care | Payer: 59 | Source: Ambulatory Visit | Attending: Neurology | Admitting: Neurology

## 2018-08-10 ENCOUNTER — Encounter: Payer: Self-pay | Admitting: Neurology

## 2018-08-10 ENCOUNTER — Ambulatory Visit: Payer: 59 | Admitting: Neurology

## 2018-08-10 ENCOUNTER — Telehealth: Payer: Self-pay | Admitting: Neurology

## 2018-08-10 VITALS — BP 159/82 | HR 76 | Ht 66.0 in | Wt 183.0 lb

## 2018-08-10 DIAGNOSIS — M25551 Pain in right hip: Secondary | ICD-10-CM

## 2018-08-10 DIAGNOSIS — G35 Multiple sclerosis: Secondary | ICD-10-CM | POA: Diagnosis not present

## 2018-08-10 DIAGNOSIS — M1611 Unilateral primary osteoarthritis, right hip: Secondary | ICD-10-CM | POA: Diagnosis not present

## 2018-08-10 DIAGNOSIS — R413 Other amnesia: Secondary | ICD-10-CM | POA: Diagnosis not present

## 2018-08-10 DIAGNOSIS — Z5181 Encounter for therapeutic drug level monitoring: Secondary | ICD-10-CM | POA: Diagnosis not present

## 2018-08-10 MED ORDER — PREDNISONE 10 MG PO TABS: ORAL_TABLET | ORAL | 0 refills | Status: DC

## 2018-08-10 NOTE — Telephone Encounter (Signed)
  I called the patient.  The patient does have mild degenerative changes in both hips, nothing obvious in the right hip that would be causing severe pain.  The patient will be treated with steroids and will have EMG nerve conduction study evaluation.  XR right hip 08/10/18:  IMPRESSION: Degenerative change without acute abnormality.

## 2018-08-10 NOTE — Progress Notes (Signed)
Reason for visit: Multiple sclerosis  Daisy Bennett is an 63 y.o. female  History of present illness:  Daisy Bennett is a 63 year old right-handed white female with a history of multiple sclerosis and diabetes.  The patient has been on Tecfidera, she is tolerating the medication fairly well, she reports no new symptoms referable to her multiple sclerosis such as numbness or weakness of the extremities, vision changes, bowel bladder control problems or significant changes in balance.  Her main complaint today is onset in mid December 2019 of some pain in the posterior hip area on the right, the patient will have discomfort going down the leg to the foot.  The patient is very tender in a specific spot in the posterior hip region, she has a lot of pain when she first gets out of bed in the morning, she has discomfort with sitting and with trying to walk.  She denies any numbness or tingling sensations down the right leg.  She has retired from her work in November 2019, she is currently taking care of her daughter who was involved in a motor vehicle accident in December.  The patient comes to the office today for an evaluation.  The patient has very little discomfort if she is lying flat in bed and not moving.  The patient continues to complain of some troubles with memory, particularly with word finding problems.  She has refused a memory test today.  Past Medical History:  Diagnosis Date  . Basal cell carcinoma   . Brain bleed ([place])   . DM (diabetes mellitus) ([place])   . Goiter, unspecified   . History of colonic polyps 2012  . Multiple sclerosis ([place])   . Non-ST elevated myocardial infarction (non-STEMI) ([place])   . SCCA (squamous cell carcinoma) of skin    right shin  . SEMI (subendocardial myocardial infarction) ([place]) 09/29/09   TOTAL RCA WITH STENT TO OM BRANCH  . Shingles   . Squamous cell carcinoma 2019   lower ext  . Tobacco abuse   . Unspecified essential hypertension   . Unspecified  hypothyroidism     Past Surgical History:  Procedure Laterality Date  . CATARACT SURGERY  12/10  . CESAREAN SECTION    . CORONARY ANGIOPLASTY WITH STENT PLACEMENT    . KNEE SURGERY    . leg surgery     squamous ccell removed   . TONSILLECTOMY    . VESICOVAGINAL FISTULA CLOSURE W/ TAH      Family History  Problem Relation Age of Onset  . Hypertension Mother   . Heart failure Father   . Aneurysm Brother        THORACIC/ABD ANEURYSM  . Prostate cancer Brother 53  . Fibromyalgia Sister   . Brain cancer Maternal Grandmother 31  . Multiple sclerosis Neg Hx     Social history:  reports that she quit smoking about 2 years ago. She smoked 1.00 pack per day. She has never used smokeless tobacco. She reports current alcohol use. She reports that she does not use drugs.    Allergies  Allergen Reactions  . Betaseron [Interferon Beta-1b] Other (See Comments)    Increased LFT's  . Provigil [Modafinil] Swelling    Tongue swelling and sores  . Topamax [Topiramate]     Cognitive slowing  . Doxycycline Other (See Comments)    Thrush,dizziness    Medications:  Prior to Admission medications   Medication Sig Start Date End Date Taking? Authorizing Provider  amitriptyline (ELAVIL)  75 MG tablet Take 1 tablet (75 mg total) by mouth daily. 05/18/18  Yes Kuneff, Renee A, DO  amLODipine (NORVASC) 10 MG tablet Take 1 tablet (10 mg total) by mouth daily. Please keep upcoming appt with Dr.Nishan in October for future refills.Thank you 05/18/18  Yes Josue Hector, MD  aspirin EC 81 MG tablet Take 1 tablet (81 mg total) daily by mouth. 06/09/17  Yes Josue Hector, MD  carvedilol (COREG) 6.25 MG tablet Take 1 tablet (6.25 mg total) by mouth 2 (two) times daily. Please keep upcoming appt with Dr.Nishan in October for future refills.Thank you 05/18/18  Yes Josue Hector, MD  cholecalciferol (VITAMIN D) 1000 units tablet Take 1,000 Units by mouth daily.   Yes [provider]  esomeprazole  (NEXIUM) 20 MG capsule Take 20 mg by mouth daily at 12 noon.   Yes [provider]  fenofibrate 160 MG tablet Take 1 tablet (160 mg total) by mouth daily. 02/15/18  Yes Kuneff, Renee A, DO  Ferrous Sulfate (IRON) 325 (65 FE) MG TABS Take 325 mg by mouth daily.    Yes [provider]  fish oil-omega-3 fatty acids 1000 MG capsule Take 1 g by mouth daily.    Yes [provider]  gabapentin (NEURONTIN) 600 MG tablet TAKE 1 TABLET BY MOUTH TWICE A DAY 06/02/18  Yes Millikan, Megan, NP  glimepiride (AMARYL) 2 MG tablet 1 tab in the morning and  1/2 tab in the evening. 05/18/18  Yes Kuneff, Renee A, DO  hydrochlorothiazide (HYDRODIURIL) 25 MG tablet Take 1 tablet (25 mg total) by mouth daily. 05/18/18  Yes Kuneff, Renee A, DO  levothyroxine (SYNTHROID, LEVOTHROID) 112 MCG tablet Take 1 tablet (112 mcg total) by mouth daily. 05/19/18  Yes Kuneff, Renee A, DO  losartan (COZAAR) 100 MG tablet TAKE 1 TABLET BY MOUTH EVERY DAY 07/31/18  Yes Josue Hector, MD  metFORMIN (GLUCOPHAGE) 1000 MG tablet Take 1 tablet (1,000 mg total) by mouth 2 (two) times daily with a meal. 05/18/18  Yes Kuneff, Renee A, DO  Multiple Vitamin (MULTIVITAMINS PO) Take 1 tablet by mouth at bedtime.    Yes [provider]  NON FORMULARY CBD oil   Yes [provider]  potassium chloride (K-DUR) 10 MEQ tablet Take 1 tablet (10 mEq total) by mouth daily. 10/17/17  Yes Kuneff, Renee A, DO  prasugrel (EFFIENT) 10 MG TABS tablet TAKE 1 TABLET BY MOUTH EVERY DAY 07/03/18  Yes Josue Hector, MD  rosuvastatin (CRESTOR) 10 MG tablet TAKE 1 TABLET BY MOUTH EVERY DAY 06/02/18  Yes Josue Hector, MD  TECFIDERA 240 MG CPDR TAKE 1 CAPSULE TWICE A DAY 01/05/18  Yes Kathrynn Ducking, MD  venlafaxine XR (EFFEXOR XR) 75 MG 24 hr capsule Take 3 capsules (225 mg total) by mouth daily with breakfast. 05/18/18  Yes Kuneff, Renee A, DO  nitroGLYCERIN (NITROSTAT) 0.4 MG SL tablet Place 1 tablet (0.4 mg total) under the  tongue every 5 (five) minutes x 3 doses as needed for chest pain. 11/17/16 07/19/18  Josue Hector, MD  predniSONE (DELTASONE) 10 MG tablet Begin taking 6 tablets daily, taper by one tablet every other day until off the medication. 08/10/18   Kathrynn Ducking, MD    ROS:  Out of a complete 14 system review of symptoms, the patient complains only of the following symptoms, and all other reviewed systems are negative.  Right hip and leg pain  Blood pressure (!) 159/82,  pulse 76, height 5\' 6"  (1.676 m), weight 183 lb (83 kg).  Physical Exam  General: The patient is alert and cooperative at the time of the examination.  Neuromuscular: Rotational movement of the right hip does not elicit pain.  The patient does not have discomfort over the SI joint on the right, palpation in the posterior hip region on the right elicits severe pain.  Skin: No significant peripheral edema is noted.   Neurologic Exam  Mental status: The patient is alert and oriented x 3 at the time of the examination. The patient has apparent normal recent and remote memory, with an apparently normal attention span and concentration ability.   Cranial nerves: Facial symmetry is present. Speech is normal, no aphasia or dysarthria is noted. Extraocular movements are full. Visual fields are full.  Pupils are equal, round, and reactive to light.  Discs are flat bilaterally.  Motor: The patient has good strength in all 4 extremities.  Sensory examination: Soft touch sensation is symmetric on the face, arms, and legs.  Coordination: The patient has good finger-nose-finger and heel-to-shin bilaterally.  Gait and station: The patient has a limping gait on the right leg. Tandem gait is normal. Romberg is negative. No drift is seen.  Reflexes: Deep tendon reflexes are symmetric.   Assessment/Plan:  1.  Multiple sclerosis  2.  Right hip and leg pain  3.  Mild memory disturbance  The patient will be sent for blood work  today on [employer].  The absolute lymphocyte counts previously are slightly low, this will need to be scrutinized closely.  The patient is having a lot of right hip discomfort, she may have a sciatic nerve compression syndrome.  X-ray of the right hip will be done.  Nerve conduction studies will be done on both legs and EMG on the right leg.  The patient will be given a prednisone Dosepak for the next 12 days.  She will follow-up otherwise in 6 months, we will check MRI of the brain at that time.  Jill Alexanders MD 08/10/2018 9:26 AM  [facility] [address] [place], [place] [address]  Phone [phone] Fax [phone]

## 2018-08-11 LAB — CBC WITH DIFFERENTIAL/PLATELET
Basophils Absolute: 0 10*3/uL (ref 0.0–0.2)
Basos: 0 %
EOS (ABSOLUTE): 0.2 10*3/uL (ref 0.0–0.4)
Eos: 3 %
Hematocrit: 37.4 % (ref 34.0–46.6)
Hemoglobin: 12.9 g/dL (ref 11.1–15.9)
Immature Grans (Abs): 0 10*3/uL (ref 0.0–0.1)
Immature Granulocytes: 1 %
Lymphocytes Absolute: 0.5 10*3/uL — ABNORMAL LOW (ref 0.7–3.1)
Lymphs: 7 %
MCH: 34.2 pg — ABNORMAL HIGH (ref 26.6–33.0)
MCHC: 34.5 g/dL (ref 31.5–35.7)
MCV: 99 fL — ABNORMAL HIGH (ref 79–97)
Monocytes Absolute: 0.5 10*3/uL (ref 0.1–0.9)
Monocytes: 8 %
Neutrophils Absolute: 5.3 10*3/uL (ref 1.4–7.0)
Neutrophils: 81 %
Platelets: 188 10*3/uL (ref 150–450)
RBC: 3.77 x10E6/uL (ref 3.77–5.28)
RDW: 11.7 % (ref 11.7–15.4)
WBC: 6.5 10*3/uL (ref 3.4–10.8)

## 2018-08-11 LAB — COMPREHENSIVE METABOLIC PANEL
ALT: 14 IU/L (ref 0–32)
AST: 16 IU/L (ref 0–40)
Albumin/Globulin Ratio: 2.1 (ref 1.2–2.2)
Albumin: 4.7 g/dL (ref 3.6–4.8)
Alkaline Phosphatase: 48 IU/L (ref 39–117)
BUN/Creatinine Ratio: 16 (ref 12–28)
BUN: 14 mg/dL (ref 8–27)
Bilirubin Total: 0.2 mg/dL (ref 0.0–1.2)
CO2: 26 mmol/L (ref 20–29)
Calcium: 10.3 mg/dL (ref 8.7–10.3)
Chloride: 95 mmol/L — ABNORMAL LOW (ref 96–106)
Creatinine, Ser: 0.87 mg/dL (ref 0.57–1.00)
GFR calc Af Amer: 81 mL/min/{1.73_m2} (ref >59)
GFR calc non Af Amer: 71 mL/min/{1.73_m2} (ref >59)
Globulin, Total: 2.2 g/dL (ref 1.5–4.5)
Glucose: 96 mg/dL (ref 65–99)
Potassium: 4.1 mmol/L (ref 3.5–5.2)
Sodium: 138 mmol/L (ref 134–144)
Total Protein: 6.9 g/dL (ref 6.0–8.5)

## 2018-08-16 ENCOUNTER — Other Ambulatory Visit: Payer: Self-pay | Admitting: Cardiovascular Disease

## 2018-09-11 ENCOUNTER — Encounter: Payer: Self-pay | Admitting: Neurology

## 2018-09-11 ENCOUNTER — Ambulatory Visit (INDEPENDENT_AMBULATORY_CARE_PROVIDER_SITE_OTHER): Payer: 59 | Admitting: Neurology

## 2018-09-11 ENCOUNTER — Ambulatory Visit: Payer: 59 | Admitting: Neurology

## 2018-09-11 DIAGNOSIS — M25551 Pain in right hip: Secondary | ICD-10-CM

## 2018-09-11 NOTE — Progress Notes (Signed)
Please refer to EMG and nerve conduction procedure note.  

## 2018-09-11 NOTE — Progress Notes (Signed)
[place]    Nerve / Sites Muscle Latency Ref. Amplitude Ref. Rel Amp Segments Distance Velocity Ref. Area    ms ms mV mV %  cm m/s m/s mVms  L Peroneal - EDB     Ankle EDB 5.1 ?6.5 3.0 ?2.0 100 Ankle - EDB 9   10.4     Fib head EDB 12.2  2.6  86.4 Fib head - Ankle 31 44 ?44 10.3     Pop fossa EDB 14.5  2.5  97 Pop fossa - Fib head 10 44 ?44 9.8         Pop fossa - Ankle      R Peroneal - EDB     Ankle EDB 4.4 ?6.5 3.0 ?2.0 100 Ankle - EDB 9   10.5     Fib head EDB 10.7  2.7  90.1 Fib head - Ankle 30 47 ?44 10.2     Pop fossa EDB 13.0  2.6  93.9 Pop fossa - Fib head 10 44 ?44 10.0         Pop fossa - Ankle      L Tibial - AH     Ankle AH 3.7 ?5.8 5.5 ?4.0 100 Ankle - AH 9   11.1     Pop fossa AH 13.0  3.7  66.6 Pop fossa - Ankle 38 41 ?41 8.6  R Tibial - AH     Ankle AH 4.4 ?5.8 6.2 ?4.0 100 Ankle - AH 9   13.3     Pop fossa AH 13.7  5.4  86.7 Pop fossa - Ankle 38 41 ?41 13.2             SNC    Nerve / Sites Rec. Site Peak Lat Ref.  Amp Ref. Segments Distance    ms ms V V  cm  L Sural - Ankle (Calf)     Calf Ankle 3.8 ?4.4 8 ?6 Calf - Ankle 14  R Sural - Ankle (Calf)     Calf Ankle 3.5 ?4.4 7 ?6 Calf - Ankle 14  L Superficial peroneal - Ankle     Lat leg Ankle 4.2 ?4.4 4 ?6 Lat leg - Ankle 14  R Superficial peroneal - Ankle     Lat leg Ankle 4.2 ?4.4 4 ?6 Lat leg - Ankle 14              F  Wave    Nerve F Lat Ref.   ms ms  L Tibial - AH 59.3 ?56.0  R Tibial - AH 59.9 ?56.0

## 2018-09-11 NOTE — Procedures (Signed)
     HISTORY:  Daisy Bennett is a 63 year old patient with history multiple sclerosis who has noted right hip discomfort that began approximately 2 months ago.  The patient actually notes that she is now much better since she has stopped having to lift her daughter who was involved in a motor vehicle accident.  Her pain has essentially disappeared within the last several weeks.  She returns for an evaluation.  NERVE CONDUCTION STUDIES:  Nerve conduction studies were performed on both lower extremities. The distal motor latencies and motor amplitudes for the peroneal and posterior tibial nerves were within normal limits. The nerve conduction velocities for these nerves were also normal. The sensory latencies for the peroneal and sural nerves were within normal limits. The F wave latencies for the posterior tibial nerves were within normal limits.   EMG STUDIES:  EMG study was performed on the right lower extremity:  The tibialis anterior muscle reveals 2 to 4K motor units with full recruitment. No fibrillations or positive waves were seen. The peroneus tertius muscle reveals 2 to 4K motor units with full recruitment. No fibrillations or positive waves were seen. The medial gastrocnemius muscle reveals 1 to 3K motor units with full recruitment. No fibrillations or positive waves were seen. The vastus lateralis muscle reveals 2 to 4K motor units with full recruitment. No fibrillations or positive waves were seen. The iliopsoas muscle reveals 2 to 4K motor units with full recruitment. No fibrillations or positive waves were seen. The biceps femoris muscle (long head) reveals 2 to 4K motor units with full recruitment. No fibrillations or positive waves were seen. The lumbosacral paraspinal muscles were tested at 3 levels, and revealed no abnormalities of insertional activity at all 3 levels tested. There was good relaxation.   IMPRESSION:  Nerve conduction studies done on both lower extremities  were within normal limits.  No evidence of a peripheral neuropathy was noted.  EMG evaluation of the right lower extremity was unremarkable, no evidence of a lumbosacral radiculopathy was seen.  Jill Alexanders MD 09/11/2018 11:15 AM  [facility] [address] [place], [place] [address]  Phone [phone] Fax [phone]

## 2018-09-20 ENCOUNTER — Other Ambulatory Visit: Payer: Self-pay | Admitting: Family Medicine

## 2018-09-22 ENCOUNTER — Other Ambulatory Visit: Payer: Self-pay | Admitting: Family Medicine

## 2018-09-22 NOTE — Telephone Encounter (Signed)
Called patient and she scheduled appt for 10/18/2018 @ 9am. Pt has about 20 pills left on Potassium and will need some more because she will not make it to next appt. I sent a 30 day supply with zero refills. Pt verbalizes understanding importance of coming to scheduled appt to get 90 day supply and refills

## 2018-10-13 ENCOUNTER — Ambulatory Visit: Payer: 59 | Admitting: Family Medicine

## 2018-10-13 ENCOUNTER — Other Ambulatory Visit: Payer: Self-pay

## 2018-10-13 ENCOUNTER — Encounter: Payer: Self-pay | Admitting: Family Medicine

## 2018-10-13 VITALS — BP 126/62 | HR 67 | Temp 98.3°F | Resp 20 | Ht 66.0 in | Wt 185.2 lb

## 2018-10-13 DIAGNOSIS — J209 Acute bronchitis, unspecified: Secondary | ICD-10-CM

## 2018-10-13 MED ORDER — PREDNISONE 20 MG PO TABS: ORAL_TABLET | ORAL | 0 refills | Status: DC

## 2018-10-13 MED ORDER — BENZONATATE 200 MG PO CAPS: ORAL_CAPSULE | ORAL | 0 refills | Status: DC

## 2018-10-13 NOTE — Progress Notes (Signed)
OFFICE VISIT  10/13/2018   CC:  Chief Complaint  Patient presents with  . Cough    Productive, x 2 weeks w/ yellow phlegm   HPI:    Patient is a 63 y.o. Caucasian female with relapsing/remitting MS, CAD s/p stent placement (on DAPT), DM, and HTN who presents for cough.  Onset 13 d/a.  No fever or URI sx's.  No wheezing, chest tightness, or SOB.  +Rattling with cough. Husband dx'd with flu 1 wk ago today.   Was taking nyquil and mucinex.  Has 2 mo old grandchild she'll be taking care of tomorrow.  Past Medical History:  Diagnosis Date  . Basal cell carcinoma   . Brain bleed ([place])   . DM (diabetes mellitus) ([place])   . Goiter, unspecified   . History of colonic polyps 2012  . Multiple sclerosis ([place])   . Non-ST elevated myocardial infarction (non-STEMI) ([place])   . SCCA (squamous cell carcinoma) of skin    right shin  . SEMI (subendocardial myocardial infarction) ([place]) 09/29/09   TOTAL RCA WITH STENT TO OM BRANCH  . Shingles   . Squamous cell carcinoma 2019   lower ext  . Tobacco abuse   . Unspecified essential hypertension   . Unspecified hypothyroidism     Past Surgical History:  Procedure Laterality Date  . CATARACT SURGERY  12/10  . CESAREAN SECTION    . CORONARY ANGIOPLASTY WITH STENT PLACEMENT    . KNEE SURGERY    . leg surgery     squamous ccell removed   . TONSILLECTOMY    . VESICOVAGINAL FISTULA CLOSURE W/ TAH      Outpatient Medications Prior to Visit  Medication Sig Dispense Refill  . amitriptyline (ELAVIL) 75 MG tablet Take 1 tablet (75 mg total) by mouth daily. 90 tablet 1  . amLODipine (NORVASC) 10 MG tablet Take 1 tablet (10 mg total) by mouth daily. 90 tablet 3  . aspirin EC 81 MG tablet Take 1 tablet (81 mg total) daily by mouth.    . carvedilol (COREG) 6.25 MG tablet Take 1 tablet (6.25 mg total) by mouth 2 (two) times daily. 180 tablet 3  . cholecalciferol (VITAMIN D) 1000 units tablet Take 1,000 Units by mouth daily.    [name] esomeprazole (NEXIUM) 20  MG capsule Take 20 mg by mouth daily at 12 noon.    . fenofibrate 160 MG tablet Take 1 tablet (160 mg total) by mouth daily. OFFICE VISIT NEEDED 30 tablet 0  . Ferrous Sulfate (IRON) 325 (65 FE) MG TABS Take 325 mg by mouth daily.     . fish oil-omega-3 fatty acids 1000 MG capsule Take 1 g by mouth daily.     [name] gabapentin (NEURONTIN) 600 MG tablet TAKE 1 TABLET BY MOUTH TWICE A DAY 180 tablet 3  . glimepiride (AMARYL) 2 MG tablet 1 tab in the morning and  1/2 tab in the evening. 135 tablet 1  . hydrochlorothiazide (HYDRODIURIL) 25 MG tablet Take 1 tablet (25 mg total) by mouth daily. 90 tablet 0  . levothyroxine (SYNTHROID, LEVOTHROID) 112 MCG tablet Take 1 tablet (112 mcg total) by mouth daily. 90 tablet 3  . losartan (COZAAR) 100 MG tablet TAKE 1 TABLET BY MOUTH EVERY DAY 90 tablet 2  . metFORMIN (GLUCOPHAGE) 1000 MG tablet Take 1 tablet (1,000 mg total) by mouth 2 (two) times daily with a meal. 180 tablet 5  . Multiple Vitamin (MULTIVITAMINS PO) Take 1 tablet by mouth at bedtime.     [name]  NON FORMULARY CBD oil    . potassium chloride (K-DUR) 10 MEQ tablet TAKE 1 TABLET BY MOUTH EVERY DAY 30 tablet 0  . prasugrel (EFFIENT) 10 MG TABS tablet TAKE 1 TABLET BY MOUTH EVERY DAY 90 tablet 3  . rosuvastatin (CRESTOR) 10 MG tablet TAKE 1 TABLET BY MOUTH EVERY DAY 90 tablet 3  . TECFIDERA 240 MG CPDR TAKE 1 CAPSULE TWICE A DAY 60 capsule 11  . triamcinolone cream (KENALOG) 0.1 %     . venlafaxine XR (EFFEXOR XR) 75 MG 24 hr capsule Take 3 capsules (225 mg total) by mouth daily with breakfast. 270 capsule 1  . predniSONE (DELTASONE) 10 MG tablet Begin taking 6 tablets daily, taper by one tablet every other day until off the medication. 42 tablet 0  . nitroGLYCERIN (NITROSTAT) 0.4 MG SL tablet Place 1 tablet (0.4 mg total) under the tongue every 5 (five) minutes x 3 doses as needed for chest pain. 25 tablet 4   Facility-Administered Medications Prior to Visit  Medication Dose Route Frequency Provider Last  Rate Last Dose  . gadopentetate dimeglumine (MAGNEVIST) injection 18 mL  18 mL Intravenous Once PRN Kathrynn Ducking, MD        Allergies  Allergen Reactions  . Betaseron [Interferon Beta-1b] Other (See Comments)    Increased LFT's  . Provigil [Modafinil] Swelling    Tongue swelling and sores  . Topamax [Topiramate]     Cognitive slowing  . Doxycycline Other (See Comments)    Thrush,dizziness    ROS As per HPI  PE: Blood pressure 126/62, pulse 67, temperature 98.3 F (36.8 C), temperature source Oral, resp. rate 20, height 5\' 6"  (1.676 m), weight 185 lb 3.2 oz (84 kg), SpO2 100 %. VS: noted--normal. Gen: alert, NAD, NONTOXIC APPEARING. HEENT: eyes without injection, drainage, or swelling.  Ears: EACs clear, TMs with normal light reflex and landmarks.  Nose: No rhinorrhea. She has some dried, crusty exudate adherent to mildly injected mucosa.  No purulent d/c.  No paranasal sinus TTP.  No facial swelling.  Throat and mouth without focal lesion.  No pharyngial swelling, erythema, or exudate.   Neck: supple, no LAD.   LUNGS: CTA bilat, nonlabored resps.  She had a couple of rhonchorus breath sounds on R side ant/post initially but a couple of forceful coughs completely cleared these sounds up.  Exp phase not prolonged. CV: RRR, no m/r/g. EXT: no c/c/e SKIN: no rash    LABS:    Chemistry      Component Value Date/Time   NA 138 08/10/2018 0932   K 4.1 08/10/2018 0932   CL 95 (L) 08/10/2018 0932   CO2 26 08/10/2018 0932   BUN 14 08/10/2018 0932   CREATININE 0.87 08/10/2018 0932      Component Value Date/Time   CALCIUM 10.3 08/10/2018 0932   ALKPHOS 48 08/10/2018 0932   AST 16 08/10/2018 0932   ALT 14 08/10/2018 0932   BILITOT 0.2 08/10/2018 0932     Lab Results  Component Value Date   WBC 6.5 08/10/2018   HGB 12.9 08/10/2018   HCT 37.4 08/10/2018   MCV 99 (H) 08/10/2018   PLT 188 08/10/2018   Lab Results  Component Value Date   TSH 5.71 (H) 05/18/2018     IMPRESSION AND PLAN:  Acute bronchitis, suspect viral etiology. Prednisone 40mg  qd x 5d, then 20mg  qd x 5d. Tessalon pearls 200 tid prn, #30, no RF. Signs/symptoms to call or return for were reviewed and  pt expressed understanding.  An After Visit Summary was printed and given to the patient.  FOLLOW UP: Return if symptoms worsen or fail to improve.  Signed:  Crissie Sickles, MD           10/13/2018

## 2018-10-17 ENCOUNTER — Other Ambulatory Visit: Payer: Self-pay | Admitting: Family Medicine

## 2018-10-18 ENCOUNTER — Ambulatory Visit: Payer: 59 | Admitting: Family Medicine

## 2018-10-18 ENCOUNTER — Other Ambulatory Visit: Payer: Self-pay

## 2018-10-18 ENCOUNTER — Encounter: Payer: Self-pay | Admitting: Family Medicine

## 2018-10-18 VITALS — BP 140/83 | HR 76 | Temp 97.6°F | Resp 16 | Ht 66.0 in | Wt 185.2 lb

## 2018-10-18 DIAGNOSIS — E119 Type 2 diabetes mellitus without complications: Secondary | ICD-10-CM | POA: Diagnosis not present

## 2018-10-18 DIAGNOSIS — E039 Hypothyroidism, unspecified: Secondary | ICD-10-CM

## 2018-10-18 DIAGNOSIS — I10 Essential (primary) hypertension: Secondary | ICD-10-CM

## 2018-10-18 DIAGNOSIS — E782 Mixed hyperlipidemia: Secondary | ICD-10-CM

## 2018-10-18 DIAGNOSIS — I25118 Atherosclerotic heart disease of native coronary artery with other forms of angina pectoris: Secondary | ICD-10-CM

## 2018-10-18 DIAGNOSIS — G35 Multiple sclerosis: Secondary | ICD-10-CM

## 2018-10-18 DIAGNOSIS — Z7902 Long term (current) use of antithrombotics/antiplatelets: Secondary | ICD-10-CM

## 2018-10-18 LAB — POCT GLYCOSYLATED HEMOGLOBIN (HGB A1C)
HbA1c POC (<> result, manual entry): 7.2 % (ref 4.0–5.6)
HbA1c, POC (controlled diabetic range): 7.2 % — AB (ref 0.0–7.0)
HbA1c, POC (prediabetic range): 7.2 % — AB (ref 5.7–6.4)
Hemoglobin A1C: 7.2 % — AB (ref 4.0–5.6)

## 2018-10-18 MED ORDER — VENLAFAXINE HCL ER 75 MG PO CP24: 225.0000 mg | ORAL_CAPSULE | Freq: Every day | ORAL | 1 refills | Status: DC

## 2018-10-18 MED ORDER — GLIMEPIRIDE 2 MG PO TABS: ORAL_TABLET | ORAL | 1 refills | Status: DC

## 2018-10-18 MED ORDER — HYDROCHLOROTHIAZIDE 50 MG PO TABS: 50.0000 mg | ORAL_TABLET | Freq: Every day | ORAL | 1 refills | Status: DC

## 2018-10-18 MED ORDER — AMITRIPTYLINE HCL 75 MG PO TABS: 75.0000 mg | ORAL_TABLET | Freq: Every day | ORAL | 1 refills | Status: DC

## 2018-10-18 NOTE — Patient Instructions (Signed)
meds are the same- except HCTZ is now one tab (higher dose tab) 50 mg total.   Amaryl- 2 pills in the morning/meal and 1/2 tab before evening meal.  A1c 7.2.  Make sure to exercise and watch your diet.    Please help [place] help you:  We are honored you have chosen [facility] for your Primary Care home. Below you will find basic instructions that you may need to access in the future. Please help [place] help you by reading the instructions, which cover many of the frequent questions we experience.   Prescription refills and request:  -In order to allow more efficient response time, please call your pharmacy for all refills. They will forward the request electronically to [place]. This allows for the quickest possible response. Request left on a nurse line can take longer to refill, since these are checked as time allows between office patients and other phone calls.  - refill request can take up to 3-5 working days to complete.  - If request is sent electronically and request is appropiate, it is usually completed in 1-2 business days.  - all patients will need to be seen routinely for all chronic medical conditions requiring prescription medications (see follow-up below). If you are overdue for follow up on your condition, you will be asked to make an appointment and we will call in enough medication to cover you until your appointment (up to 30 days).  - all controlled substances will require a face to face visit to request/refill.  - if you desire your prescriptions to go through a new pharmacy, and have an active script at original pharmacy, you will need to call your pharmacy and have scripts transferred to new pharmacy. This is completed between the pharmacy locations and not by your provider.    Results: If any images or labs were ordered, it can take up to 1 week to get results depending on the test ordered and the lab/facility running and resulting the test. - Normal or stable results, which  do not need further discussion, may be released to your mychart immediately with attached note to you. A call may not be generated for normal results. Please make certain to sign up for mychart. If you have questions on how to activate your mychart you can call the front office.  - If your results need further discussion, our office will attempt to contact you via phone, and if unable to reach you after 2 attempts, we will release your abnormal result to your mychart with instructions.  - All results will be automatically released in mychart after 1 week.  - Your provider will provide you with explanation and instruction on all relevant material in your results. Please keep in mind, results and labs may appear confusing or abnormal to the untrained eye, but it does not mean they are actually abnormal for you personally. If you have any questions about your results that are not covered, or you desire more detailed explanation than what was provided, you should make an appointment with your provider to do so.   Our office handles many outgoing and incoming calls daily. If we have not contacted you within 1 week about your results, please check your mychart to see if there is a message first and if not, then contact our office.  In helping with this matter, you help decrease call volume, and therefore allow [place] to be able to respond to patients needs more efficiently.   Acute office  visits (sick visit):  An acute visit is intended for a new problem and are scheduled in shorter time slots to allow schedule openings for patients with new problems. This is the appropriate visit to discuss a new problem. Problems will not be addressed by phone call or Echart message. Appointment is needed if requesting treatment. In order to provide you with excellent quality medical care with proper time for you to explain your problem, have an exam and receive treatment with instructions, these appointments should be limited to  one new problem per visit. If you experience a new problem, in which you desire to be addressed, please make an acute office visit, we save openings on the schedule to accommodate you. Please do not save your new problem for any other type of visit, let us take care of it properly and quickly for you.   Follow up visits:  Depending on your condition(s) your provider will need to see you routinely in order to provide you with quality care and prescribe medication(s). Most chronic conditions (Example: hypertension, Diabetes, depression/anxiety... etc), require visits a couple times a year. Your provider will instruct you on proper follow up for your personal medical conditions and history. Please make certain to make follow up appointments for your condition as instructed. Failing to do so could result in lapse in your medication treatment/refills. If you request a refill, and are overdue to be seen on a condition, we will always provide you with a 30 day script (once) to allow you time to schedule.    Medicare wellness (well visit): - we have a wonderful Nurse Maudie Mercury), that will meet with you and provide you will yearly medicare wellness visits. These visits should occur yearly (can not be scheduled less than 1 calendar year apart) and cover preventive health, immunizations, advance directives and screenings you are entitled to yearly through your medicare benefits. Do not miss out on your entitled benefits, this is when medicare will pay for these benefits to be ordered for you.  These are strongly encouraged by your provider and is the appropriate type of visit to make certain you are up to date with all preventive health benefits. If you have not had your medicare wellness exam in the last 12 months, please make certain to schedule one by calling the office and schedule your medicare wellness with Maudie Mercury as soon as possible.   Yearly physical (well visit):  - Adults are recommended to be seen yearly for  physicals. Check with your insurance and date of your last physical, most insurances require one calendar year between physicals. Physicals include all preventive health topics, screenings, medical exam and labs that are appropriate for gender/age and history. You may have fasting labs needed at this visit. This is a well visit (not a sick visit), new problems should not be covered during this visit (see acute visit).  - Pediatric patients are seen more frequently when they are younger. Your provider will advise you on well child visit timing that is appropriate for your their age. - This is not a medicare wellness visit. Medicare wellness exams do not have an exam portion to the visit. Some medicare companies allow for a physical, some do not allow a yearly physical. If your medicare allows a yearly physical you can schedule the medicare wellness with our nurse Maudie Mercury and have your physical with your provider after, on the same day. Please check with insurance for your full benefits.   Late Policy/No Shows:  - all  new patients should arrive 15-30 minutes earlier than appointment to allow [place] time  to  obtain all personal demographics,  insurance information and for you to complete office paperwork. - All established patients should arrive 10-15 minutes earlier than appointment time to update all information and be checked in .  - In our best efforts to run on time, if you are late for your appointment you will be asked to either reschedule or if able, we will work you back into the schedule. There will be a wait time to work you back in the schedule,  depending on availability.  - If you are unable to make it to your appointment as scheduled, please call 24 hours ahead of time to allow [place] to fill the time slot with someone else who needs to be seen. If you do not cancel your appointment ahead of time, you may be charged a no show fee.

## 2018-10-18 NOTE — Progress Notes (Signed)
Daisy Bennett , 03/23/1956, 63 y.o., female MRN: [id number] Patient Care Team    Relationship Specialty Notifications Start End  Ma Hillock, DO PCP - General Family Medicine  03/10/15   Josue Hector, MD PCP - Cardiology Cardiology Admissions 05/31/18   [place], Jennefer Bravo, MD Referring Physician Dermatology  02/10/16   Kathrynn Ducking, MD Consulting Physician Neurology  05/18/16   Linda Hedges, Victor Physician Obstetrics and Gynecology  06/04/16   Anell Barr, OD  Optometry  08/17/16     Chief Complaint  Patient presents with  . Diabetes    Not fasting. Pt is doing well with no complaints   . Hypertension     Subjective:  Diabetes/morbid obesity: Patient presents for routine scheduled diabetes follow-up. Last A1c was  6.4.  Today she reports compliance with Amaryl  2 mg/1mg  daily and metformin 1000 mg BID and she is tolerating them. She is still  not checking her sugars.Patient denies dizziness, hyperglycemic or hypoglycemic events. Patient denies numbness, tingling in the extremities or nonhealing wounds of feet.  Has retired in the last 3 months, and may be not quite as active. She is on gabapentin 600 mg BID.  POCT HgB A1C-->  6.5 --> 7.2--> 6.1 --> 6.0--> 6.4--> 6.5-->6.4--> 6.3>>> 7.2  today - PSV 10/2016--> competed series -  foot exam 02/15/2018 - Eye exam reports eye exam 03/2018 UTD.  Dr. Venetia Constable, yearly eye exams encouraged - Microalbumin: on an ARB - flu shot UTD 05/2018   Hypertension/hyperlipidemia: Today She reports compliance with her medication regimen of Norvasc 10 mg, Coreg 6.25mg  BID,  Losartan 100 and hctz 50. H/O CAD, NSTEMI, post stent 2011 (OM)-collateralized RCA.  Pt is on BB, statin (low dose), ACE, fenofibrate, ASA 325.  She is on Effient.  She follows with Dr. Johnsie Cancel.  She did not take her medication today. BMP:  08/10/2018 within normal limits CBC: 08/10/2018 within normal limits Lipids: 05/18/18 WNL, tg mildly elevated TSH:  05/18/2018, mildly elevated 5.71 Diet: low sodium Exercise: routine  depression/MS:  Patient reports she is doing well today.  She has been going through a lot of family illnesses recently with her grandchildren being hospitalized..  She is tolerating the Effexor at 225 mg daily.  Reports compliance with amitriptyline 75 mg nightly.  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  Feels her meds are working well for her.    Depression screen [facility] 2/9 02/15/2018 10/17/2017 06/17/2017 06/04/2016  Decreased Interest 0 0 0 0  Down, Depressed, Hopeless 0 0 0 0  PHQ - 2 Score 0 0 0 0  Altered sleeping - 2 - -  Tired, decreased energy - 0 - -  Change in appetite - 1 - -  Feeling bad or failure about yourself  - 0 - -  Trouble concentrating - 0 - -  Moving slowly or fidgety/restless - 0 - -  Suicidal thoughts - 0 - -  PHQ-9 Score - 3 - -    Allergies  Allergen Reactions  . Betaseron [Interferon Beta-1b] Other (See Comments)    Increased LFT's  . Provigil [Modafinil] Swelling    Tongue swelling and sores  . Topamax [Topiramate]     Cognitive slowing  . Doxycycline Other (See Comments)    Thrush,dizziness   Social History   Tobacco Use  . Smoking status: Former Smoker    Packs/day: 1.00    Last attempt to quit: 12/16/2015  Years since quitting: 2.8  . Smokeless tobacco: Never Used  Substance Use Topics  . Alcohol use: Yes    Alcohol/week: 0.0 standard drinks    Comment: OCCASIONAL ALCOHOL USE.   Past Medical History:  Diagnosis Date  . Basal cell carcinoma   . Brain bleed ([place])   . DM (diabetes mellitus) ([place])   . Goiter, unspecified   . History of colonic polyps 2012  . Multiple sclerosis ([place])   . Non-ST elevated myocardial infarction (non-STEMI) ([place])   . SCCA (squamous cell carcinoma) of skin    right shin  . SEMI (subendocardial myocardial infarction) ([place]) 09/29/09   TOTAL RCA WITH STENT TO OM BRANCH  . Shingles   . Squamous cell carcinoma 2019   lower ext  .  Tobacco abuse   . Unspecified essential hypertension   . Unspecified hypothyroidism    Past Surgical History:  Procedure Laterality Date  . CATARACT SURGERY  12/10  . CESAREAN SECTION    . CORONARY ANGIOPLASTY WITH STENT PLACEMENT    . KNEE SURGERY    . leg surgery     squamous ccell removed   . TONSILLECTOMY    . VESICOVAGINAL FISTULA CLOSURE W/ TAH     Family History  Problem Relation Age of Onset  . Hypertension Mother   . Heart failure Father   . Aneurysm Brother        THORACIC/ABD ANEURYSM  . Prostate cancer Brother 96  . Fibromyalgia Sister   . Brain cancer Maternal Grandmother 45  . Multiple sclerosis Neg Hx    Allergies as of 10/18/2018      Reactions   Betaseron [interferon Beta-1b] Other (See Comments)   Increased LFT's   Provigil [modafinil] Swelling   Tongue swelling and sores   Topamax [topiramate]    Cognitive slowing   Doxycycline Other (See Comments)   Thrush,dizziness      Medication List       Accurate as of October 18, 2018  9:42 AM. Always use your most recent med list.        amitriptyline 75 MG tablet Commonly known as:  ELAVIL Take 1 tablet (75 mg total) by mouth daily.   amLODipine 10 MG tablet Commonly known as:  NORVASC Take 1 tablet (10 mg total) by mouth daily.   aspirin EC 81 MG tablet Take 1 tablet (81 mg total) daily by mouth.   carvedilol 6.25 MG tablet Commonly known as:  COREG Take 1 tablet (6.25 mg total) by mouth 2 (two) times daily.   cholecalciferol 1000 units tablet Commonly known as:  VITAMIN D Take 1,000 Units by mouth daily.   esomeprazole 20 MG capsule Commonly known as:  NEXIUM Take 20 mg by mouth daily at 12 noon.   fenofibrate 160 MG tablet TAKE 1 TABLET (160 MG TOTAL) BY MOUTH DAILY. OFFICE VISIT NEEDED   fish oil-omega-3 fatty acids 1000 MG capsule Take 1 g by mouth daily.   gabapentin 600 MG tablet Commonly known as:  NEURONTIN TAKE 1 TABLET BY MOUTH TWICE A DAY   glimepiride 2 MG  tablet Commonly known as:  AMARYL 2 tab in the morning and  1/2 tab in the evening.   hydrochlorothiazide 50 MG tablet Commonly known as:  HYDRODIURIL Take 1 tablet (50 mg total) by mouth daily.   Iron 325 (65 Fe) MG Tabs Take 325 mg by mouth daily.   levothyroxine 112 MCG tablet Commonly known as:  SYNTHROID, LEVOTHROID Take 1 tablet (112 mcg  total) by mouth daily.   losartan 100 MG tablet Commonly known as:  COZAAR TAKE 1 TABLET BY MOUTH EVERY DAY   metFORMIN 1000 MG tablet Commonly known as:  GLUCOPHAGE Take 1 tablet (1,000 mg total) by mouth 2 (two) times daily with a meal.   MULTIVITAMINS PO Take 1 tablet by mouth at bedtime.   nitroGLYCERIN 0.4 MG SL tablet Commonly known as:  Nitrostat Place 1 tablet (0.4 mg total) under the tongue every 5 (five) minutes x 3 doses as needed for chest pain.   NON FORMULARY CBD oil   potassium chloride 10 MEQ tablet Commonly known as:  K-DUR TAKE 1 TABLET BY MOUTH EVERY DAY   prasugrel 10 MG Tabs tablet Commonly known as:  EFFIENT TAKE 1 TABLET BY MOUTH EVERY DAY   rosuvastatin 10 MG tablet Commonly known as:  CRESTOR TAKE 1 TABLET BY MOUTH EVERY DAY   Tecfidera 240 MG Cpdr Generic drug:  Dimethyl Fumarate TAKE 1 CAPSULE TWICE A DAY   triamcinolone cream 0.1 % Commonly known as:  KENALOG   venlafaxine XR 75 MG 24 hr capsule Commonly known as:  Effexor XR Take 3 capsules (225 mg total) by mouth daily with breakfast.       All past medical history, surgical history, allergies, family history, immunizations andmedications were updated in the EMR today and reviewed under the history and medication portions of their EMR.     ROS: Negative, with the exception of above mentioned in HPI   Objective:  BP 140/83 (BP Location: Right Arm, Patient Position: Sitting, Cuff Size: Normal)   Pulse 76   Temp 97.6 F (36.4 C) (Oral)   Resp 16   Ht 5\' 6"  (1.676 m)   Wt 185 lb 4 oz (84 kg)   SpO2 100%   BMI 29.90 kg/m  Body  mass index is 29.9 kg/m. Gen: Afebrile. No acute distress.  HENT: AT. [place]. MMM.  Eyes:Pupils Equal Round Reactive to light, Extraocular movements intact,  Conjunctiva without redness, discharge or icterus. Neck/lymp/endocrine: Supple,no lymphadenopathy, no thyromegaly CV: RRR 1/6, no edema, +2/4 P posterior tibialis pulses Chest: CTAB, no wheeze or crackles Abd: Soft. NTND. BS present Skin: no rashes, purpura or petechiae.  Neuro:  Normal gait. PERLA. EOMi. Alert. Oriented x3. Psych: Normal affect, dress and demeanor. Normal speech. Normal thought content and judgment.    No exam data present No results found. Results for orders placed or performed in visit on 10/18/18 (from the past 24 hour(s))  POCT glycosylated hemoglobin (Hb A1C)     Status: Abnormal   Collection Time: 10/18/18  9:34 AM  Result Value Ref Range   Hemoglobin A1C 7.2 (A) 4.0 - 5.6 %   HbA1c POC (<> result, manual entry) 7.2 4.0 - 5.6 %   HbA1c, POC (prediabetic range) 7.2 (A) 5.7 - 6.4 %   HbA1c, POC (controlled diabetic range) 7.2 (A) 0.0 - 7.0 %    Assessment/Plan: DONNITA FARINA is a 63 y.o. female present for OV for  Diabetes mellitus without complication (HCC)/Morbid obesity -A1c increased today.  -  Increase Amaryl to 4 mg in the morning and 1 mg in the evening -Continue  metformin 1000 mg BID.   - increase exercise regimen and continue dietary modification.  Continue gabapentin 600 mg nightly - POCT HgB A1C-->  6.5 --> 7.2--> 6.1 --> 6.0--> 6.4--> 6.5-->6.4--> 6.3>>> 7.2  today - PSV 10/2016--> competed series -  foot exam 02/15/2018 - Eye exam reports eye exam 03/2018  UTD.  Dr. Venetia Constable, yearly eye exams encouraged - Microalbumin: on an ARB - flu shot UTD 05/2018  -3-68-month follow-up  Hypertension/hyperlipidemia/Antiplatelet or antithrombotic long-term use/athersclerosis/hypokalemia:  - Stable at last visit a few weeks ago.  She has not taken her medication today therefore blood pressure is  higher on reading. - follows with cardiology, Dr. Johnsie Cancel who prescribes medications.  - low salt, increase exercise.  - Continue to monitor at home if above 140/90 will need to see sooner and discuss.  -  Kdur 10 meq/d - Continue fenofibrate  - continue statin.  - continue Effient - maintain routine cardiology follow ups.   depression/hypothyroid//MS:  - Stable. Continue effexor 225 mg QD.  - TSH normal.  - Tecfidera prescribed by neurology.  Gabapentin prescribed by neurology. -Continue amitriptyline 75 mg nightly, refills provided.    Routine follow-up every 4 months.    Reviewed expectations re: course of current medical issues.  Discussed self-management of symptoms.  Outlined signs and symptoms indicating need for more acute intervention.  Patient verbalized understanding and all questions were answered.  Patient received an After-Visit Summary.    Orders Placed This Encounter  Procedures  . POCT glycosylated hemoglobin (Hb A1C)     Note is dictated utilizing voice recognition software. Although note has been proof read prior to signing, occasional typographical errors still can be missed. If any questions arise, please do not hesitate to call for verification.   electronically signed by:  Howard Pouch, DO  M[facility] Barrera

## 2018-10-19 ENCOUNTER — Encounter: Payer: Self-pay | Admitting: Family Medicine

## 2018-11-14 ENCOUNTER — Other Ambulatory Visit: Payer: Self-pay | Admitting: Family Medicine

## 2018-11-30 ENCOUNTER — Encounter: Payer: Self-pay | Admitting: Family Medicine

## 2018-11-30 ENCOUNTER — Other Ambulatory Visit: Payer: Self-pay

## 2018-11-30 ENCOUNTER — Ambulatory Visit (INDEPENDENT_AMBULATORY_CARE_PROVIDER_SITE_OTHER): Payer: 59 | Admitting: Family Medicine

## 2018-11-30 VITALS — BP 145/72 | Temp 97.8°F | Wt 181.0 lb

## 2018-11-30 DIAGNOSIS — N3000 Acute cystitis without hematuria: Secondary | ICD-10-CM

## 2018-11-30 MED ORDER — NITROFURANTOIN MONOHYD MACRO 100 MG PO CAPS: 100.0000 mg | ORAL_CAPSULE | Freq: Two times a day (BID) | ORAL | 0 refills | Status: DC

## 2018-11-30 NOTE — Progress Notes (Signed)
Virtual Visit via Video Note  I connected with Daisy Bennett on 11/30/18 at  1:20 PM EDT by a video enabled telemedicine application and verified that I am speaking with the correct person using two identifiers.  Location patient: home Location provider:work or home office Persons participating in the virtual visit: patient, provider  I discussed the limitations of evaluation and management by telemedicine and the availability of in person appointments. The patient expressed understanding and agreed to proceed.  Telemedicine visit is a necessity given the COVID-19 restrictions in place at the current time.  HPI: 63 y/o WF with multiple sclerosis, CAD, DM, HTN, and hypothyroidism who is being seen today for urinary complaints. Burning with urination, started about 3-4 d/a, no urinary urgency or frequency.  NO abd pain but some mild suprapubic discomfort.  No flank pain.  No nausea.  Urine looks cloudy to her.  No foul smell of urine. No malaise or fevers.  No use of OTC meds for this. No vag d/c.    ROS: see HPI  Past Medical History:  Diagnosis Date  . Basal cell carcinoma   . Brain bleed ([place])   . DM (diabetes mellitus) ([place])   . Goiter, unspecified   . History of colonic polyps 2012  . Multiple sclerosis ([place])   . Non-ST elevated myocardial infarction (non-STEMI) ([place])   . SCCA (squamous cell carcinoma) of skin    right shin  . SEMI (subendocardial myocardial infarction) ([place]) 09/29/09   TOTAL RCA WITH STENT TO OM BRANCH  . Shingles   . Squamous cell carcinoma 2019   lower ext  . Tobacco abuse   . Unspecified essential hypertension   . Unspecified hypothyroidism     Past Surgical History:  Procedure Laterality Date  . CATARACT SURGERY  12/10  . CESAREAN SECTION    . CORONARY ANGIOPLASTY WITH STENT PLACEMENT    . KNEE SURGERY    . leg surgery     squamous ccell removed   . TONSILLECTOMY    . VESICOVAGINAL FISTULA CLOSURE W/ TAH      Family History  Problem Relation Age of  Onset  . Hypertension Mother   . Heart failure Father   . Aneurysm Brother        THORACIC/ABD ANEURYSM  . Prostate cancer Brother 56  . Fibromyalgia Sister   . Brain cancer Maternal Grandmother 36  . Multiple sclerosis Neg Hx      Current Outpatient Medications:  .  amitriptyline (ELAVIL) 75 MG tablet, Take 1 tablet (75 mg total) by mouth daily., Disp: 90 tablet, Rfl: 1 .  amLODipine (NORVASC) 10 MG tablet, Take 1 tablet (10 mg total) by mouth daily., Disp: 90 tablet, Rfl: 3 .  aspirin EC 81 MG tablet, Take 1 tablet (81 mg total) daily by mouth., Disp: , Rfl:  .  carvedilol (COREG) 6.25 MG tablet, Take 1 tablet (6.25 mg total) by mouth 2 (two) times daily., Disp: 180 tablet, Rfl: 3 .  cholecalciferol (VITAMIN D) 1000 units tablet, Take 1,000 Units by mouth daily., Disp: , Rfl:  .  esomeprazole (NEXIUM) 20 MG capsule, Take 20 mg by mouth daily at 12 noon., Disp: , Rfl:  .  fenofibrate 160 MG tablet, TAKE 1 TABLET (160 MG TOTAL) BY MOUTH DAILY. OFFICE VISIT NEEDED, Disp: 90 tablet, Rfl: 3 .  Ferrous Sulfate (IRON) 325 (65 FE) MG TABS, Take 325 mg by mouth daily. , Disp: , Rfl:  .  fish oil-omega-3 fatty acids 1000 MG capsule, Take  1 g by mouth daily. , Disp: , Rfl:  .  gabapentin (NEURONTIN) 600 MG tablet, TAKE 1 TABLET BY MOUTH TWICE A DAY, Disp: 180 tablet, Rfl: 3 .  glimepiride (AMARYL) 2 MG tablet, 2 tab in the morning and  1/2 tab in the evening., Disp: 270 tablet, Rfl: 1 .  hydrochlorothiazide (HYDRODIURIL) 50 MG tablet, Take 1 tablet (50 mg total) by mouth daily., Disp: 90 tablet, Rfl: 1 .  levothyroxine (SYNTHROID, LEVOTHROID) 112 MCG tablet, Take 1 tablet (112 mcg total) by mouth daily., Disp: 90 tablet, Rfl: 3 .  losartan (COZAAR) 100 MG tablet, TAKE 1 TABLET BY MOUTH EVERY DAY, Disp: 90 tablet, Rfl: 2 .  metFORMIN (GLUCOPHAGE) 1000 MG tablet, Take 1 tablet (1,000 mg total) by mouth 2 (two) times daily with a meal., Disp: 180 tablet, Rfl: 5 .  Multiple Vitamin (MULTIVITAMINS PO),  Take 1 tablet by mouth at bedtime. , Disp: , Rfl:  .  NON FORMULARY, CBD oil, Disp: , Rfl:  .  potassium chloride (K-DUR) 10 MEQ tablet, TAKE 1 TABLET BY MOUTH EVERY DAY, Disp: 30 tablet, Rfl: 0 .  prasugrel (EFFIENT) 10 MG TABS tablet, TAKE 1 TABLET BY MOUTH EVERY DAY, Disp: 90 tablet, Rfl: 3 .  rosuvastatin (CRESTOR) 10 MG tablet, TAKE 1 TABLET BY MOUTH EVERY DAY, Disp: 90 tablet, Rfl: 3 .  TECFIDERA 240 MG CPDR, TAKE 1 CAPSULE TWICE A DAY, Disp: 60 capsule, Rfl: 11 .  venlafaxine XR (EFFEXOR XR) 75 MG 24 hr capsule, Take 3 capsules (225 mg total) by mouth daily with breakfast., Disp: 270 capsule, Rfl: 1 .  nitroGLYCERIN (NITROSTAT) 0.4 MG SL tablet, Place 1 tablet (0.4 mg total) under the tongue every 5 (five) minutes x 3 doses as needed for chest pain., Disp: 25 tablet, Rfl: 4 .  triamcinolone cream (KENALOG) 0.1 %, , Disp: , Rfl:   EXAM:  VITALS per patient if applicable: BP (!) 694/85 (BP Location: Left Arm, Patient Position: Sitting, Cuff Size: Normal)   Temp 97.8 F (36.6 C) (Oral)   Wt 181 lb (82.1 kg)   BMI 29.21 kg/m    GENERAL: alert, oriented, appears well and in no acute distress  HEENT: atraumatic, conjunttiva clear, no obvious abnormalities on inspection of external nose and ears  NECK: normal movements of the head and neck  LUNGS: on inspection no signs of respiratory distress, breathing rate appears normal, no obvious gross SOB, gasping or wheezing  CV: no obvious cyanosis  MS: moves all visible extremities without noticeable abnormality  PSYCH/NEURO: pleasant and cooperative, no obvious depression or anxiety, speech and thought processing grossly intact  LABS: none today    Chemistry      Component Value Date/Time   NA 138 08/10/2018 0932   K 4.1 08/10/2018 0932   CL 95 (L) 08/10/2018 0932   CO2 26 08/10/2018 0932   BUN 14 08/10/2018 0932   CREATININE 0.87 08/10/2018 0932      Component Value Date/Time   CALCIUM 10.3 08/10/2018 0932   ALKPHOS 48  08/10/2018 0932   AST 16 08/10/2018 0932   ALT 14 08/10/2018 0932   BILITOT 0.2 08/10/2018 0932     Lab Results  Component Value Date   WBC 6.5 08/10/2018   HGB 12.9 08/10/2018   HCT 37.4 08/10/2018   MCV 99 (H) 08/10/2018   PLT 188 08/10/2018   Lab Results  Component Value Date   TSH 5.71 (H) 05/18/2018   HbA1c 10/18/18= 7.2%  ASSESSMENT AND PLAN:  Discussed the following assessment and plan:  Acute cystitis. Will go ahead and treat with abx based on symptoms and will forego urine specimen eval at this time. Macrobid 100mg  bid x 3d, finish 2 additional days of abx IF you are improved but all sx's have not completely resolved.  Call office if NOT improving after 3d of abx or if worsening/new sx's prior to that.   I discussed the assessment and treatment plan with the patient. The patient was provided an opportunity to ask questions and all were answered. The patient agreed with the plan and demonstrated an understanding of the instructions.   The patient was advised to call back or seek an in-person evaluation if the symptoms worsen or if the condition fails to improve as anticipated.  F/u: if not improving appropriately  Signed:  Crissie Sickles, MD           11/30/2018

## 2018-12-05 ENCOUNTER — Other Ambulatory Visit: Payer: Self-pay | Admitting: Neurology

## 2018-12-07 ENCOUNTER — Other Ambulatory Visit (INDEPENDENT_AMBULATORY_CARE_PROVIDER_SITE_OTHER): Payer: 59

## 2018-12-07 ENCOUNTER — Other Ambulatory Visit: Payer: Self-pay

## 2018-12-07 DIAGNOSIS — N3000 Acute cystitis without hematuria: Secondary | ICD-10-CM

## 2018-12-07 LAB — POCT URINALYSIS DIPSTICK
Bilirubin, UA: NEGATIVE
Glucose, UA: NEGATIVE
Ketones, UA: NEGATIVE
Nitrite, UA: NEGATIVE
Protein, UA: POSITIVE — AB
Spec Grav, UA: 1.015 (ref 1.010–1.025)
Urobilinogen, UA: 1 E.U./dL
pH, UA: 6.5 (ref 5.0–8.0)

## 2018-12-07 MED ORDER — CIPROFLOXACIN HCL 500 MG PO TABS: 500.0000 mg | ORAL_TABLET | Freq: Two times a day (BID) | ORAL | 0 refills | Status: AC

## 2018-12-07 NOTE — Addendum Note (Signed)
Addended by: Tammi Sou on: 12/07/2018 11:01 AM   Modules accepted: Orders

## 2018-12-07 NOTE — Progress Notes (Addendum)
Results of Urine dip placed. Urine sent off for culture. Urine  Was very cloudy with very foul/strong smell.  ADDENDUM 12/07/18 10:56 AM Pt still with UTI sx's after taking a course of macrobid. She walked in to clinic today so we did UA + urine c/s. UA results today c/w infection.  Urine sent for c/s. Will start cipro 500 mg bid x 5d.  Signed:  Crissie Sickles, MD           12/07/2018

## 2018-12-08 ENCOUNTER — Other Ambulatory Visit: Payer: Self-pay | Admitting: Family Medicine

## 2018-12-09 ENCOUNTER — Other Ambulatory Visit: Payer: Self-pay | Admitting: Neurology

## 2018-12-09 LAB — URINE CULTURE
MICRO NUMBER:: 454035
SPECIMEN QUALITY:: ADEQUATE

## 2018-12-28 ENCOUNTER — Other Ambulatory Visit: Payer: Self-pay

## 2018-12-28 DIAGNOSIS — I25119 Atherosclerotic heart disease of native coronary artery with unspecified angina pectoris: Secondary | ICD-10-CM

## 2018-12-28 MED ORDER — NITROGLYCERIN 0.4 MG SL SUBL: 0.4000 mg | SUBLINGUAL_TABLET | SUBLINGUAL | 4 refills | Status: DC | PRN

## 2019-01-03 ENCOUNTER — Other Ambulatory Visit: Payer: Self-pay | Admitting: Family Medicine

## 2019-01-27 ENCOUNTER — Other Ambulatory Visit: Payer: Self-pay | Admitting: Family Medicine

## 2019-02-12 ENCOUNTER — Encounter: Payer: Self-pay | Admitting: Neurology

## 2019-02-12 ENCOUNTER — Other Ambulatory Visit: Payer: Self-pay

## 2019-02-12 ENCOUNTER — Ambulatory Visit (INDEPENDENT_AMBULATORY_CARE_PROVIDER_SITE_OTHER): Payer: 59 | Admitting: Neurology

## 2019-02-12 VITALS — BP 128/73 | HR 67 | Temp 96.9°F | Ht 66.0 in | Wt 183.0 lb

## 2019-02-12 DIAGNOSIS — Z5181 Encounter for therapeutic drug level monitoring: Secondary | ICD-10-CM | POA: Diagnosis not present

## 2019-02-12 DIAGNOSIS — G35 Multiple sclerosis: Secondary | ICD-10-CM | POA: Diagnosis not present

## 2019-02-12 MED ORDER — ALPRAZOLAM 0.5 MG PO TABS: ORAL_TABLET | ORAL | 0 refills | Status: DC

## 2019-02-12 NOTE — Progress Notes (Signed)
Reason for visit: Multiple sclerosis  Daisy Bennett is an 63 y.o. female  History of present illness:  Daisy Bennett is a 63 year old right-handed white female with a history of multiple sclerosis, currently on Tecfidera.  Daisy Bennett tolerates Daisy drug fairly well, she has been running absolute lymphocyte counts of around 0.5 on Daisy medication.  Daisy Bennett reports no new symptoms since last seen, she denies any numbness or weakness of Daisy face, arms, legs, she denies any vision changes or changes in Daisy control Daisy bowels or Daisy bladder.  She denies any significant balance problems but she did fall on one occasion in March 2020 and struck her head when she was stooping over to help her dog.  Daisy Bennett had black eyes, she had some decline in cognitive function temporarily after Daisy fall and may have had a postconcussive syndrome.  She is back to her usual cognitive baseline.  She has seen a chiropractor for her right leg sciatica type pain which has been beneficial.  Daisy Bennett returns to this office for an evaluation.  Past Medical History:  Diagnosis Date   Basal cell carcinoma    Brain bleed ([facility])    DM (diabetes mellitus) ([place])    Goiter, unspecified    History of colonic polyps 2012   Multiple sclerosis ([facility])    Non-ST elevated myocardial infarction (non-STEMI) ([facility])    SCCA (squamous cell carcinoma) of skin    right shin   SEMI (subendocardial myocardial infarction) ([place]) 09/29/09   TOTAL RCA WITH STENT TO OM BRANCH   Shingles    Squamous cell carcinoma 2019   lower ext   Tobacco abuse    Unspecified essential hypertension    Unspecified hypothyroidism     Past Surgical History:  Procedure Laterality Date   CATARACT SURGERY  12/10   CESAREAN SECTION     CORONARY ANGIOPLASTY WITH STENT PLACEMENT     KNEE SURGERY     leg surgery     squamous ccell removed    TONSILLECTOMY     VESICOVAGINAL FISTULA CLOSURE W/ TAH      Family History  Problem  Relation Age of Onset   Hypertension Mother    Heart failure Father    Aneurysm Brother        THORACIC/ABD ANEURYSM   Prostate cancer Brother 55   Fibromyalgia Sister    Brain cancer Maternal Grandmother 61   Multiple sclerosis Neg Hx     Social history:  reports that she quit smoking about 3 years ago. She smoked 1.00 pack per day. She has never used smokeless tobacco. She reports current alcohol use. She reports that she does not use drugs.    Allergies  Allergen Reactions   Betaseron [Interferon Beta-1b] Other (See Comments)    Increased LFT's   Provigil [Modafinil] Swelling    Tongue swelling and sores   Topamax [Topiramate]     Cognitive slowing   Doxycycline Other (See Comments)    Thrush,dizziness    Medications:  Prior to Admission medications   Medication Sig Start Date End Date Taking? Authorizing Provider  amitriptyline (ELAVIL) 75 MG tablet Take 1 tablet (75 mg total) by mouth daily. 10/18/18  Yes Kuneff, Renee A, DO  amLODipine (NORVASC) 10 MG tablet Take 1 tablet (10 mg total) by mouth daily. 08/16/18  Yes Josue Hector, MD  aspirin EC 81 MG tablet Take 1 tablet (81 mg total) daily by mouth. 06/09/17  Yes Josue Hector,  MD  carvedilol (COREG) 6.25 MG tablet Take 1 tablet (6.25 mg total) by mouth 2 (two) times daily. 08/16/18  Yes Josue Hector, MD  cholecalciferol (VITAMIN D) 1000 units tablet Take 1,000 Units by mouth daily.   Yes [provider]  esomeprazole (NEXIUM) 20 MG capsule Take 20 mg by mouth daily at 12 noon.   Yes [provider]  fenofibrate 160 MG tablet TAKE 1 TABLET (160 MG TOTAL) BY MOUTH DAILY. OFFICE VISIT NEEDED 11/15/18  Yes Kuneff, Renee A, DO  Ferrous Sulfate (IRON) 325 (65 FE) MG TABS Take 325 mg by mouth daily.    Yes [provider]  fish oil-omega-3 fatty acids 1000 MG capsule Take 1 g by mouth daily.    Yes [provider]  gabapentin (NEURONTIN) 600 MG tablet TAKE 1 TABLET BY MOUTH TWICE  A DAY 06/02/18  Yes Millikan, Megan, NP  glimepiride (AMARYL) 2 MG tablet 2 tab in Daisy morning and  1/2 tab in Daisy evening. 10/18/18  Yes Kuneff, Renee A, DO  hydrochlorothiazide (HYDRODIURIL) 50 MG tablet Take 1 tablet (50 mg total) by mouth daily. 10/18/18  Yes Kuneff, Renee A, DO  levothyroxine (SYNTHROID, LEVOTHROID) 112 MCG tablet Take 1 tablet (112 mcg total) by mouth daily. 05/19/18  Yes Kuneff, Renee A, DO  losartan (COZAAR) 100 MG tablet TAKE 1 TABLET BY MOUTH EVERY DAY 07/31/18  Yes Josue Hector, MD  metFORMIN (GLUCOPHAGE) 1000 MG tablet Take 1 tablet (1,000 mg total) by mouth 2 (two) times daily with a meal. 05/18/18  Yes Kuneff, Renee A, DO  Multiple Vitamin (MULTIVITAMINS PO) Take 1 tablet by mouth at bedtime.    Yes [provider]  nitroGLYCERIN (NITROSTAT) 0.4 MG SL tablet Place 1 tablet (0.4 mg total) under Daisy tongue every 5 (five) minutes x 3 doses as needed for chest pain. 12/28/18 08/28/20 Yes Josue Hector, MD  potassium chloride (K-DUR) 10 MEQ tablet TAKE 1 TABLET BY MOUTH EVERY DAY 01/29/19  Yes Kuneff, Renee A, DO  prasugrel (EFFIENT) 10 MG TABS tablet TAKE 1 TABLET BY MOUTH EVERY DAY 07/03/18  Yes Josue Hector, MD  rosuvastatin (CRESTOR) 10 MG tablet TAKE 1 TABLET BY MOUTH EVERY DAY 06/02/18  Yes Josue Hector, MD  TECFIDERA 240 MG CPDR TAKE 1 CAPSULE TWICE A DAY 12/05/18  Yes Kathrynn Ducking, MD  triamcinolone cream (KENALOG) 0.1 %  06/27/18  Yes [provider]  venlafaxine XR (EFFEXOR XR) 75 MG 24 hr capsule Take 3 capsules (225 mg total) by mouth daily with breakfast. 10/18/18  Yes Kuneff, Renee A, DO  ALPRAZolam (XANAX) 0.5 MG tablet Take 2 tablets approximately 45 minutes prior to Daisy MRI study, take a third tablet if needed. 02/12/19   Kathrynn Ducking, MD    ROS:  Out of a complete 14 system review of symptoms, Daisy Bennett complains only of Daisy following symptoms, and all other reviewed systems are negative.  Mild memory problems Mild walking  troubles  Blood pressure 128/73, pulse 67, temperature (!) 96.9 F (36.1 C), temperature source Temporal, height 5\' 6"  (1.676 m), weight 183 lb (83 kg).  Physical Exam  General: Daisy Bennett is alert and cooperative at Daisy time of Daisy examination.  Skin: No significant peripheral edema is noted.   Neurologic Exam  Mental status: Daisy Bennett is alert and oriented x 3 at Daisy time of Daisy examination. Daisy Bennett has apparent normal recent and remote memory, with an apparently normal attention span  and concentration ability.  Mini-Mental status examination done today shows a total score 29/30.   Cranial nerves: Facial symmetry is present. Speech is normal, no aphasia or dysarthria is noted. Extraocular movements are full. Visual fields are full.  Pupils are equal, round, and reactive to light.  Discs are flat bilaterally.  Motor: Daisy Bennett has good strength in all 4 extremities.  Sensory examination: Soft touch sensation is symmetric on Daisy face, arms, and legs.  Coordination: Daisy Bennett has good finger-nose-finger and heel-to-shin bilaterally.  Gait and station: Daisy Bennett has a normal gait. Tandem gait is normal. Romberg is negative. No drift is seen.  Reflexes: Deep tendon reflexes are symmetric.   Assessment/Plan:  1.  Multiple sclerosis  2.  Reported mild memory problems  3.  Right sided sciatica  Daisy Bennett is overall doing fairly well.  Blood work will be done today.  She will be set up for MRI of Daisy brain with and without gadolinium enhancement.  A prescription for Xanax was called in for Daisy MRI.  She will follow-up in 6 months.  Jill Alexanders MD 02/12/2019 10:44 AM  [facility] [address] [place], [place] [address]  Phone [phone] Fax [phone]

## 2019-02-13 ENCOUNTER — Telehealth: Payer: Self-pay | Admitting: Neurology

## 2019-02-13 LAB — CBC WITH DIFFERENTIAL/PLATELET
Basophils Absolute: 0 10*3/uL (ref 0.0–0.2)
Basos: 0 %
EOS (ABSOLUTE): 0.2 10*3/uL (ref 0.0–0.4)
Eos: 4 %
Hematocrit: 35.6 % (ref 34.0–46.6)
Hemoglobin: 11.9 g/dL (ref 11.1–15.9)
Immature Grans (Abs): 0 10*3/uL (ref 0.0–0.1)
Immature Granulocytes: 0 %
Lymphocytes Absolute: 0.3 10*3/uL — ABNORMAL LOW (ref 0.7–3.1)
Lymphs: 6 %
MCH: 32.5 pg (ref 26.6–33.0)
MCHC: 33.4 g/dL (ref 31.5–35.7)
MCV: 97 fL (ref 79–97)
Monocytes Absolute: 0.5 10*3/uL (ref 0.1–0.9)
Monocytes: 8 %
Neutrophils Absolute: 4.5 10*3/uL (ref 1.4–7.0)
Neutrophils: 82 %
Platelets: 136 10*3/uL — ABNORMAL LOW (ref 150–450)
RBC: 3.66 x10E6/uL — ABNORMAL LOW (ref 3.77–5.28)
RDW: 11.9 % (ref 11.7–15.4)
WBC: 5.5 10*3/uL (ref 3.4–10.8)

## 2019-02-13 LAB — COMPREHENSIVE METABOLIC PANEL
ALT: 20 IU/L (ref 0–32)
AST: 20 IU/L (ref 0–40)
Albumin/Globulin Ratio: 2 (ref 1.2–2.2)
Albumin: 4.5 g/dL (ref 3.8–4.8)
Alkaline Phosphatase: 49 IU/L (ref 39–117)
BUN/Creatinine Ratio: 17 (ref 12–28)
BUN: 19 mg/dL (ref 8–27)
Bilirubin Total: <0.2 mg/dL (ref 0.0–1.2)
CO2: 23 mmol/L (ref 20–29)
Calcium: 9.7 mg/dL (ref 8.7–10.3)
Chloride: 98 mmol/L (ref 96–106)
Creatinine, Ser: 1.1 mg/dL — ABNORMAL HIGH (ref 0.57–1.00)
GFR calc Af Amer: 61 mL/min/{1.73_m2} (ref >59)
GFR calc non Af Amer: 53 mL/min/{1.73_m2} — ABNORMAL LOW (ref >59)
Globulin, Total: 2.3 g/dL (ref 1.5–4.5)
Glucose: 223 mg/dL — ABNORMAL HIGH (ref 65–99)
Potassium: 3.7 mmol/L (ref 3.5–5.2)
Sodium: 139 mmol/L (ref 134–144)
Total Protein: 6.8 g/dL (ref 6.0–8.5)

## 2019-02-13 MED ORDER — TECFIDERA 120 MG PO CPDR: 120.0000 mg | DELAYED_RELEASE_CAPSULE | Freq: Two times a day (BID) | ORAL | 1 refills | Status: DC

## 2019-02-13 NOTE — Telephone Encounter (Signed)
no to the covid-19 questions MR Brain w/wo contrast Dr. Jannifer Franklin [facility] Auth: [id number] (exp. 02/13/19 to 03/30/19). Patient is scheduled at [facility] for 02/20/19.

## 2019-02-13 NOTE — Telephone Encounter (Addendum)
I called the patient.  The chemistry panel shows elevated blood glucose, mild renal impairment.  The excellent lymphocyte count on the CBC is now down to 0.3, it was 0.5, on Tecfidera.  Given this drop, I have recommended that we lower the dose to 120 mg twice daily.  We will need to recheck the blood work in 6 to 8 weeks after the switch over.  The patient will start taking just one 240 tablet daily for now.

## 2019-02-20 ENCOUNTER — Telehealth: Payer: Self-pay

## 2019-02-20 ENCOUNTER — Telehealth: Payer: Self-pay | Admitting: Neurology

## 2019-02-20 ENCOUNTER — Ambulatory Visit: Payer: 59

## 2019-02-20 ENCOUNTER — Other Ambulatory Visit: Payer: Self-pay

## 2019-02-20 DIAGNOSIS — G35 Multiple sclerosis: Secondary | ICD-10-CM | POA: Diagnosis not present

## 2019-02-20 MED ORDER — GADOBENATE DIMEGLUMINE 529 MG/ML IV SOLN
17.0000 mL | Freq: Once | INTRAVENOUS | Status: AC | PRN
  Administered 2019-02-20: 17 mL via INTRAVENOUS

## 2019-02-20 NOTE — Telephone Encounter (Signed)
I called the patient. The MRI of the brain is stable from 04/2016. I discussed this with Daisy Bennett.    MRI brain 02/20/19:  IMPRESSION:   This MRI of the brain with and without contrast shows the following: 1.   Multiple T2/flair hyperintense foci in the hemispheres in a pattern and configuration consistent with chronic demyelinating plaque associated with multiple sclerosis.  None of the foci appears to be acute and they do not enhance.  When compared to the MRI dated 04/11/2016, there are no new lesions. 2.   There is a normal enhancement pattern and no acute findings.

## 2019-02-20 NOTE — Telephone Encounter (Signed)
PA for tecfidera has been submitted through cover my meds.  (Key: [id number]) Rx #: [id number]

## 2019-02-21 ENCOUNTER — Ambulatory Visit: Payer: 59 | Admitting: Family Medicine

## 2019-02-26 NOTE — Telephone Encounter (Signed)
I reached out to [employer] after not receiving the further questions via cover my meds. I spoke with Juliann Pulse D and initiated a verbal PA.  PA was instantly approved.  Ref # [id number] Approval dates: 01/27/19-02/26/2020. [place] advised pt would be notified.

## 2019-02-28 ENCOUNTER — Ambulatory Visit (INDEPENDENT_AMBULATORY_CARE_PROVIDER_SITE_OTHER): Payer: 59 | Admitting: Family Medicine

## 2019-02-28 ENCOUNTER — Other Ambulatory Visit: Payer: Self-pay

## 2019-02-28 ENCOUNTER — Encounter: Payer: Self-pay | Admitting: Family Medicine

## 2019-02-28 VITALS — BP 121/69 | HR 72 | Temp 98.3°F | Resp 17 | Ht 66.0 in | Wt 184.0 lb

## 2019-02-28 DIAGNOSIS — E039 Hypothyroidism, unspecified: Secondary | ICD-10-CM | POA: Diagnosis not present

## 2019-02-28 DIAGNOSIS — E782 Mixed hyperlipidemia: Secondary | ICD-10-CM

## 2019-02-28 DIAGNOSIS — E119 Type 2 diabetes mellitus without complications: Secondary | ICD-10-CM | POA: Diagnosis not present

## 2019-02-28 DIAGNOSIS — I10 Essential (primary) hypertension: Secondary | ICD-10-CM

## 2019-02-28 DIAGNOSIS — Z7902 Long term (current) use of antithrombotics/antiplatelets: Secondary | ICD-10-CM | POA: Diagnosis not present

## 2019-02-28 DIAGNOSIS — I25118 Atherosclerotic heart disease of native coronary artery with other forms of angina pectoris: Secondary | ICD-10-CM | POA: Diagnosis not present

## 2019-02-28 LAB — POCT GLYCOSYLATED HEMOGLOBIN (HGB A1C)
HbA1c POC (<> result, manual entry): 6 % (ref 4.0–5.6)
HbA1c, POC (controlled diabetic range): 6 % (ref 0.0–7.0)
HbA1c, POC (prediabetic range): 6 % (ref 5.7–6.4)
Hemoglobin A1C: 6 % — AB (ref 4.0–5.6)

## 2019-02-28 MED ORDER — LEVOTHYROXINE SODIUM 112 MCG PO TABS: 112.0000 ug | ORAL_TABLET | Freq: Every day | ORAL | 0 refills | Status: DC

## 2019-02-28 MED ORDER — ESOMEPRAZOLE MAGNESIUM 20 MG PO CPDR: 20.0000 mg | DELAYED_RELEASE_CAPSULE | Freq: Every day | ORAL | 1 refills | Status: DC

## 2019-02-28 MED ORDER — HYDROCHLOROTHIAZIDE 50 MG PO TABS: 50.0000 mg | ORAL_TABLET | Freq: Every day | ORAL | 1 refills | Status: DC

## 2019-02-28 MED ORDER — VENLAFAXINE HCL ER 75 MG PO CP24: 225.0000 mg | ORAL_CAPSULE | Freq: Every day | ORAL | 1 refills | Status: DC

## 2019-02-28 MED ORDER — METFORMIN HCL 1000 MG PO TABS: 1000.0000 mg | ORAL_TABLET | Freq: Two times a day (BID) | ORAL | 5 refills | Status: DC

## 2019-02-28 MED ORDER — GLIMEPIRIDE 2 MG PO TABS: ORAL_TABLET | ORAL | 1 refills | Status: DC

## 2019-02-28 MED ORDER — AMITRIPTYLINE HCL 75 MG PO TABS: 75.0000 mg | ORAL_TABLET | Freq: Every day | ORAL | 1 refills | Status: DC

## 2019-02-28 NOTE — Progress Notes (Signed)
Daisy Bennett , 11-28-1955, 63 y.o., female MRN: [id number] Patient Care Team    Relationship Specialty Notifications Start End  Ma Hillock, DO PCP - General Family Medicine  03/10/15   Josue Hector, MD PCP - Cardiology Cardiology Admissions 05/31/18   [place], Jennefer Bravo, MD Referring Physician Dermatology  02/10/16   Kathrynn Ducking, MD Consulting Physician Neurology  05/18/16   Linda Hedges, [place] Physician Obstetrics and Gynecology  06/04/16   Anell Barr, OD  Optometry  08/17/16     Chief Complaint  Patient presents with  . Diabetes    Pt is doing well with no complaints   . Hypertension     Subjective:  Diabetes/morbid obesity: Patient presents for routine scheduled diabetes follow-up. Last A1c was  7.2.  Today she reports compliance with Amaryl  4 mg/1mg  daily and metformin 1000 mg BID and she is tolerating them. She is still  not checking her sugars.Patient denies dizziness, hyperglycemic or hypoglycemic events. Patient denies numbness, tingling in the extremities or nonhealing wounds of feet.  She is on gabapentin 600 mg BID.  POCT HgB A1C-->  6.5 --> 7.2--> 6.1 --> 6.0--> 6.4--> 6.5-->6.4--> 6.3>>> 7.2  today - PSV 10/2016--> competed series -  foot exam 02/28/2019 - Eye exam reports eye exam 03/2018 UTD.  Dr. Venetia Constable, yearly eye exams encouraged>> she will schedule - Microalbumin: on an ARB - flu shot UTD 05/2018   Hypertension/hyperlipidemia: Today She reports complaince with her medication regimen of Crestor 10 mg daily, Effient 10 mg daily, losartan 100 mg daily, HCTZ 50 mg daily, fish oil 1000 mg daily, fenofibrate 160 mg daily Norvasc 10 mg, baby aspirin daily, Coreg 6.25 mg twice daily.  H/O CAD, NSTEMI, post stent 2011 (OM)-collateralized RCA.  She is on Effient and baby aspirin.  She follows with Dr. Johnsie Cancel.  She did not take her medication today. BMP:  08/10/2018 within normal limits CBC: 08/10/2018 within normal limits Lipids: 05/18/18  WNL, tg mildly elevated TSH: 05/18/2018, mildly elevated 5.71 Diet: low sodium Exercise: routine  depression/MS:  Patient reports she is well today. She is tolerating the Effexor at 225 mg daily.  Reports compliance with amitriptyline 75 mg nightly.  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  Feels her meds are working well for her.    Depression screen [facility] 2/9 02/15/2018 10/17/2017 06/17/2017 06/04/2016  Decreased Interest 0 0 0 0  Down, Depressed, Hopeless 0 0 0 0  PHQ - 2 Score 0 0 0 0  Altered sleeping - 2 - -  Tired, decreased energy - 0 - -  Change in appetite - 1 - -  Feeling bad or failure about yourself  - 0 - -  Trouble concentrating - 0 - -  Moving slowly or fidgety/restless - 0 - -  Suicidal thoughts - 0 - -  PHQ-9 Score - 3 - -    Allergies  Allergen Reactions  . Betaseron [Interferon Beta-1b] Other (See Comments)    Increased LFT's  . Provigil [Modafinil] Swelling    Tongue swelling and sores  . Topamax [Topiramate]     Cognitive slowing  . Doxycycline Other (See Comments)    Thrush,dizziness   Social History   Tobacco Use  . Smoking status: Former Smoker    Packs/day: 1.00    Quit date: 12/16/2015    Years since quitting: 3.2  . Smokeless tobacco: Never Used  Substance Use Topics  . Alcohol use:  Yes    Alcohol/week: 0.0 standard drinks    Comment: OCCASIONAL ALCOHOL USE.   Past Medical History:  Diagnosis Date  . Basal cell carcinoma   . Brain bleed ([place])   . DM (diabetes mellitus) ([place])   . Goiter, unspecified   . History of colonic polyps 2012  . Multiple sclerosis ([place])   . Non-ST elevated myocardial infarction (non-STEMI) ([place])   . SCCA (squamous cell carcinoma) of skin    right shin  . SEMI (subendocardial myocardial infarction) ([place]) 09/29/09   TOTAL RCA WITH STENT TO OM BRANCH  . Shingles   . Squamous cell carcinoma 2019   lower ext  . Tobacco abuse   . Unspecified essential hypertension   . Unspecified hypothyroidism    Past  Surgical History:  Procedure Laterality Date  . CATARACT SURGERY  12/10  . CESAREAN SECTION    . CORONARY ANGIOPLASTY WITH STENT PLACEMENT    . KNEE SURGERY    . leg surgery     squamous ccell removed   . TONSILLECTOMY    . VESICOVAGINAL FISTULA CLOSURE W/ TAH     Family History  Problem Relation Age of Onset  . Hypertension Mother   . Heart failure Father   . Aneurysm Brother        THORACIC/ABD ANEURYSM  . Prostate cancer Brother 42  . Fibromyalgia Sister   . Brain cancer Maternal Grandmother 100  . Multiple sclerosis Neg Hx    Allergies as of 02/28/2019      Reactions   Betaseron [interferon Beta-1b] Other (See Comments)   Increased LFT's   Provigil [modafinil] Swelling   Tongue swelling and sores   Topamax [topiramate]    Cognitive slowing   Doxycycline Other (See Comments)   Thrush,dizziness      Medication List       Accurate as of February 28, 2019  9:21 AM. If you have any questions, ask your nurse or doctor.        ALPRAZolam 0.5 MG tablet Commonly known as: XANAX Take 2 tablets approximately 45 minutes prior to the MRI study, take a third tablet if needed.   amitriptyline 75 MG tablet Commonly known as: ELAVIL Take 1 tablet (75 mg total) by mouth daily.   amLODipine 10 MG tablet Commonly known as: NORVASC Take 1 tablet (10 mg total) by mouth daily.   aspirin EC 81 MG tablet Take 1 tablet (81 mg total) daily by mouth.   carvedilol 6.25 MG tablet Commonly known as: COREG Take 1 tablet (6.25 mg total) by mouth 2 (two) times daily.   cholecalciferol 1000 units tablet Commonly known as: VITAMIN D Take 1,000 Units by mouth daily.   esomeprazole 20 MG capsule Commonly known as: NEXIUM Take 20 mg by mouth daily at 12 noon.   fenofibrate 160 MG tablet TAKE 1 TABLET (160 MG TOTAL) BY MOUTH DAILY. OFFICE VISIT NEEDED   fish oil-omega-3 fatty acids 1000 MG capsule Take 1 g by mouth daily.   gabapentin 600 MG tablet Commonly known as: NEURONTIN TAKE  1 TABLET BY MOUTH TWICE A DAY   glimepiride 2 MG tablet Commonly known as: AMARYL 2 tab in the morning and  1/2 tab in the evening.   hydrochlorothiazide 50 MG tablet Commonly known as: HYDRODIURIL Take 1 tablet (50 mg total) by mouth daily.   Iron 325 (65 Fe) MG Tabs Take 325 mg by mouth daily.   levothyroxine 112 MCG tablet Commonly known as: SYNTHROID Take 1 tablet (  112 mcg total) by mouth daily.   losartan 100 MG tablet Commonly known as: COZAAR TAKE 1 TABLET BY MOUTH EVERY DAY   metFORMIN 1000 MG tablet Commonly known as: GLUCOPHAGE Take 1 tablet (1,000 mg total) by mouth 2 (two) times daily with a meal.   MULTIVITAMINS PO Take 1 tablet by mouth at bedtime.   nitroGLYCERIN 0.4 MG SL tablet Commonly known as: Nitrostat Place 1 tablet (0.4 mg total) under the tongue every 5 (five) minutes x 3 doses as needed for chest pain.   potassium chloride 10 MEQ tablet Commonly known as: K-DUR TAKE 1 TABLET BY MOUTH EVERY DAY   prasugrel 10 MG Tabs tablet Commonly known as: EFFIENT TAKE 1 TABLET BY MOUTH EVERY DAY   rosuvastatin 10 MG tablet Commonly known as: CRESTOR TAKE 1 TABLET BY MOUTH EVERY DAY   Tecfidera 120 MG Cpdr Generic drug: Dimethyl Fumarate Take 120 mg by mouth 2 (two) times a day. What changed: when to take this   triamcinolone cream 0.1 % Commonly known as: KENALOG   venlafaxine XR 75 MG 24 hr capsule Commonly known as: Effexor XR Take 3 capsules (225 mg total) by mouth daily with breakfast.       All past medical history, surgical history, allergies, family history, immunizations andmedications were updated in the EMR today and reviewed under the history and medication portions of their EMR.     ROS: Negative, with the exception of above mentioned in HPI   Objective:  BP 121/69 (BP Location: Left Arm, Patient Position: Sitting, Cuff Size: Normal)   Pulse 72   Temp 98.3 F (36.8 C) (Temporal)   Resp 17   Ht 5\' 6"  (1.676 m)   Wt 184 lb  (83.5 kg)   SpO2 100%   BMI 29.70 kg/m  Body mass index is 29.7 kg/m. Gen: Afebrile. No acute distress.  Nontoxic in appearance, well-developed, well-nourished, Caucasian female. HENT: AT. [place].  MMM.  Eyes:Pupils Equal Round Reactive to light, Extraocular movements intact,  Conjunctiva without redness, discharge or icterus. Neck/lymp/endocrine: Supple, no lymphadenopathy, no thyromegaly CV: RRR 1/6 SM, no edema, +2/4 P posterior tibialis pulses Chest: CTAB, no wheeze or crackles Abd: Soft. NTND. BS present.  No masses palpated.  Skin: No rashes, purpura or petechiae.  Neuro:  Normal gait. PERLA. EOMi. Alert. Oriented x3 Psych: Normal affect, dress and demeanor. Normal speech. Normal thought content and judgment.  Diabetic Foot Exam - Simple   Simple Foot Form Diabetic Foot exam was performed with the following findings: Yes 02/28/2019  9:24 AM  Visual Inspection No deformities, no ulcerations, no other skin breakdown bilaterally: Yes Sensation Testing Intact to touch and monofilament testing bilaterally: Yes Pulse Check Posterior Tibialis and Dorsalis pulse intact bilaterally: Yes Comments      No exam data present No results found. No results found for this or any previous visit (from the past 24 hour(s)).  Assessment/Plan: JALEIA HANKE is a 63 y.o. female present for OV for  Diabetes mellitus without complication (HCC)/Morbid obesity -A1c improved! -  Continue Amaryl to 4 mg in the morning and 1 mg in the evening -Continue  metformin 1000 mg BID.   - increase exercise regimen and continue dietary modification.  Continue gabapentin 600 mg nightly  POCT HgB A1C-->  6.5 --> 7.2--> 6.1 --> 6.0--> 6.4--> 6.5-->6.4--> 6.3>>> 7.2  today - PSV 10/2016--> competed series -  foot exam 02/28/2019 - Eye exam reports eye exam 03/2018 UTD.  Dr. Venetia Constable, yearly eye  exams encouraged>> she will schedule - Microalbumin: on an ARB - flu shot UTD 05/2018  -3-64-month follow-up   Hypertension/hyperlipidemia/Antiplatelet or antithrombotic long-term use/athersclerosis/hypokalemia/s/p stent placement:  - Stable.  - follows with cardiology, Dr. Johnsie Cancel who prescribes medications.  - low salt, increase exercise.  - Continue to monitor at home if above 140/90 will need to see sooner and discuss.  -  Kdur 10 meq/d - Continue fenofibrate  - continue statin.  - continue Effient>> told her discuss with her cardiologist the continued use of effient and asa with her multiple falls and subdural hematoma she is at a high bleeding risk.  - maintain routine cardiology follow ups.   depression/hypothyroid//MS:  - stable Continue effexor 225 mg QD.  - TSH normal.  - Tecfidera prescribed by neurology.  Gabapentin prescribed by neurology. -Continue amitriptyline 75 mg nightly, refills provided.    Routine follow-up every 4 months.    Reviewed expectations re: course of current medical issues.  Discussed self-management of symptoms.  Outlined signs and symptoms indicating need for more acute intervention.  Patient verbalized understanding and all questions were answered.  Patient received an After-Visit Summary.    Orders Placed This Encounter  Procedures  . POCT glycosylated hemoglobin (Hb A1C)     Note is dictated utilizing voice recognition software. Although note has been proof read prior to signing, occasional typographical errors still can be missed. If any questions arise, please do not hesitate to call for verification.   electronically signed by:  Howard Pouch, DO  [place]

## 2019-02-28 NOTE — Patient Instructions (Addendum)
Great to see you today.  BP looks great. I refilled your meds.  Diabetes is well controlled- a1c 6.0 today! Great job!!!   Stay safe.  Remember to tell your cardiologist about your bruising and brain bleed with trauma- as well as the recent falls etc.    Follow up in 4 months.

## 2019-03-01 ENCOUNTER — Encounter: Payer: Self-pay | Admitting: Family Medicine

## 2019-03-22 ENCOUNTER — Encounter: Payer: Self-pay | Admitting: Gastroenterology

## 2019-04-12 ENCOUNTER — Other Ambulatory Visit: Payer: Self-pay

## 2019-04-12 ENCOUNTER — Other Ambulatory Visit (INDEPENDENT_AMBULATORY_CARE_PROVIDER_SITE_OTHER): Payer: Self-pay

## 2019-04-12 DIAGNOSIS — G35 Multiple sclerosis: Secondary | ICD-10-CM

## 2019-04-12 DIAGNOSIS — Z5181 Encounter for therapeutic drug level monitoring: Secondary | ICD-10-CM

## 2019-04-12 DIAGNOSIS — Z0289 Encounter for other administrative examinations: Secondary | ICD-10-CM

## 2019-04-13 LAB — CBC WITH DIFFERENTIAL/PLATELET
Basophils Absolute: 0.1 10*3/uL (ref 0.0–0.2)
Basos: 1 %
EOS (ABSOLUTE): 0.4 10*3/uL (ref 0.0–0.4)
Eos: 4 %
Hematocrit: 34.9 % (ref 34.0–46.6)
Hemoglobin: 12 g/dL (ref 11.1–15.9)
Immature Grans (Abs): 0 10*3/uL (ref 0.0–0.1)
Immature Granulocytes: 0 %
Lymphocytes Absolute: 0.6 10*3/uL — ABNORMAL LOW (ref 0.7–3.1)
Lymphs: 6 %
MCH: 32.1 pg (ref 26.6–33.0)
MCHC: 34.4 g/dL (ref 31.5–35.7)
MCV: 93 fL (ref 79–97)
Monocytes Absolute: 0.7 10*3/uL (ref 0.1–0.9)
Monocytes: 7 %
Neutrophils Absolute: 7.3 10*3/uL — ABNORMAL HIGH (ref 1.4–7.0)
Neutrophils: 82 %
Platelets: 171 10*3/uL (ref 150–450)
RBC: 3.74 x10E6/uL — ABNORMAL LOW (ref 3.77–5.28)
RDW: 12.3 % (ref 11.7–15.4)
WBC: 8.9 10*3/uL (ref 3.4–10.8)

## 2019-04-27 ENCOUNTER — Other Ambulatory Visit: Payer: Self-pay | Admitting: Cardiovascular Disease

## 2019-04-27 ENCOUNTER — Other Ambulatory Visit: Payer: Self-pay | Admitting: Adult Health

## 2019-05-01 ENCOUNTER — Ambulatory Visit: Payer: 59 | Admitting: Gastroenterology

## 2019-05-01 ENCOUNTER — Telehealth: Payer: Self-pay | Admitting: *Deleted

## 2019-05-01 ENCOUNTER — Encounter: Payer: Self-pay | Admitting: Gastroenterology

## 2019-05-01 VITALS — BP 128/68 | HR 68 | Temp 98.1°F | Ht 66.0 in | Wt 184.0 lb

## 2019-05-01 DIAGNOSIS — I25118 Atherosclerotic heart disease of native coronary artery with other forms of angina pectoris: Secondary | ICD-10-CM | POA: Diagnosis not present

## 2019-05-01 DIAGNOSIS — Z86010 Personal history of colonic polyps: Secondary | ICD-10-CM

## 2019-05-01 DIAGNOSIS — Z7902 Long term (current) use of antithrombotics/antiplatelets: Secondary | ICD-10-CM

## 2019-05-01 MED ORDER — SUPREP BOWEL PREP KIT 17.5-3.13-1.6 GM/177ML PO SOLN: 1.0000 | ORAL | 0 refills | Status: DC

## 2019-05-01 NOTE — Telephone Encounter (Signed)
I have spoken to patient to advise that Dr Johnsie Cancel has okayed her to hold Effient 5 days prior to her upcoming colonoscopy procedure. She verbalizes understanding of this.

## 2019-05-01 NOTE — Progress Notes (Signed)
[facility] Consult Note:  History: Daisy Bennett 05/01/2019  Referring provider: Ma Hillock, DO  Reason for consult/chief complaint: History of colon polyps  Subjective  HPI:  Daisy Bennett made an appointment to see me today to discuss history of colon polyps.  She had a routine colonoscopy with Dr. Gaylyn Cheers at [facility] in June 2012.  No polyps were found, but she was advised to have a repeat colonoscopy in 5 years for unclear reasons.  The patient then contacted [place] in 2017 because she had been given that recommendation, I reviewed the available records, and recommended she have her next colonoscopy in June 2022 based on current guidelines.  Daisy Bennett has become increasingly concerned about the recommendation for repeat colonoscopy in 10 years, because she seems to recall polyps on a colonoscopy prior to 2012.  Those records are not available, she does not know number type or size of polyps, but recalls that Dr. Vira Agar strongly she should come back in 3 to 5 years, which was the 2012 exam. She denies abdominal pain altered bowel habits rectal bleeding heartburn dysphagia odynophagia vomiting or weight loss.  ROS:  Review of Systems She denies chest pain dyspnea or dysuria Difficulty with her memory  Past Medical History: Past Medical History:  Diagnosis Date  . Basal cell carcinoma   . Brain bleed ([place]) 2017  . DM (diabetes mellitus) ([place])   . Goiter, unspecified   . History of colonic polyps 2012  . Multiple sclerosis ([place])   . Non-ST elevated myocardial infarction (non-STEMI) ([place])   . SCCA (squamous cell carcinoma) of skin    right shin  . SEMI (subendocardial myocardial infarction) ([place]) 09/29/09   TOTAL RCA WITH STENT TO OM BRANCH  . Shingles   . Squamous cell carcinoma 2019   lower ext  . Tobacco abuse   . Unspecified essential hypertension   . Unspecified hypothyroidism    I reviewed her last cardiology office note from  October 2019 with Dr. Johnsie Cancel.   She had non-ST segment elevation MI in 2011 with stenting of an OM lesion.  She has diffuse three-vessel disease with a collateralized RCA.  This seems to be the reason she has been maintained on DAPT.  Past Surgical History: Past Surgical History:  Procedure Laterality Date  . CATARACT SURGERY  12/10  . CESAREAN SECTION    . CORONARY ANGIOPLASTY WITH STENT PLACEMENT  2011  . KNEE SURGERY    . leg surgery     squamous ccell removed   . TONSILLECTOMY    . VESICOVAGINAL FISTULA CLOSURE W/ TAH       Family History: Family History  Problem Relation Age of Onset  . Hypertension Mother   . Heart failure Father   . Diabetes Father   . Aneurysm Brother        THORACIC/ABD ANEURYSM  . Prostate cancer Brother 45  . Diabetes Brother   . Fibromyalgia Sister   . Brain cancer Maternal Grandmother 66  . Multiple sclerosis Neg Hx   . Colon cancer Neg Hx   . Esophageal cancer Neg Hx     Social History: Social History   Socioeconomic History  . Marital status: Married    Spouse name: Not on file  . Number of children: 2  . Years of education: 7  . Highest education level: Not on file  Occupational History  . Occupation: Therapist, music  Social Needs  . Financial resource strain: Not on file  .  Food insecurity    Worry: Not on file    Inability: Not on file  . Transportation needs    Medical: Not on file    Non-medical: Not on file  Tobacco Use  . Smoking status: Current Some Day Smoker    Packs/day: 1.00    Last attempt to quit: 12/16/2015    Years since quitting: 3.3  . Smokeless tobacco: Never Used  Substance and Sexual Activity  . Alcohol use: Yes    Alcohol/week: 0.0 standard drinks    Comment: OCCASIONAL ALCOHOL USE.  . Drug use: No  . Sexual activity: Yes  Lifestyle  . Physical activity    Days per week: Not on file    Minutes per session: Not on file  . Stress: Not on file  Relationships  . Social Product manager on phone: Not on file    Gets together: Not on file    Attends religious service: Not on file    Active member of club or organization: Not on file    Attends meetings of clubs or organizations: Not on file    Relationship status: Not on file  Other Topics Concern  . Not on file  Social History Narrative   The patient lives with husband. 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    Patient drinks occasionally, does not chew tobacco, does not use recreational drugs. She does drink occasional caffeine. She does wear her seatbelt. She does wear bike helmet riding a bike. She does exercise 3 times a week. She does not wear hearing aids or dentures. There is a smoke alarm in her home. There are no firearms in her home. She feels safe in her relationship. She has never experienced physical abuse.   Patient sleeps 7-8 hours a night.   Patient drinks 2 cups of caffeine daily.   Patient is right handed.    Allergies: Allergies  Allergen Reactions  . Betaseron [Interferon Beta-1b] Other (See Comments)    Increased LFT's  . Provigil [Modafinil] Swelling    Tongue swelling and sores  . Topamax [Topiramate]     Cognitive slowing  . Doxycycline Other (See Comments)    Thrush,dizziness    Outpatient Meds: Current Outpatient Medications  Medication Sig Dispense Refill  . amitriptyline (ELAVIL) 75 MG tablet Take 1 tablet (75 mg total) by mouth daily. 90 tablet 1  . amLODipine (NORVASC) 10 MG tablet Take 1 tablet (10 mg total) by mouth daily. 90 tablet 3  . aspirin EC 81 MG tablet Take 1 tablet (81 mg total) daily by mouth.    . carvedilol (COREG) 6.25 MG tablet Take 1 tablet (6.25 mg total) by mouth 2 (two) times daily. 180 tablet 3  . cholecalciferol (VITAMIN D) 1000 units tablet Take 1,000 Units by mouth daily.    . Dimethyl Fumarate (TECFIDERA) 120 MG CPDR Take 120 mg by mouth 2 (two) times a day. (Patient taking differently: Take 120 mg by mouth 2 (two) times  daily. ) 180 capsule 1  . esomeprazole (NEXIUM) 20 MG capsule Take 1 capsule (20 mg total) by mouth daily at 12 noon. 90 capsule 1  . fenofibrate 160 MG tablet TAKE 1 TABLET (160 MG TOTAL) BY MOUTH DAILY. OFFICE VISIT NEEDED 90 tablet 3  . Ferrous Sulfate (IRON) 325 (65 FE) MG TABS Take 325 mg by mouth daily.     . fish oil-omega-3 fatty acids 1000 MG capsule Take 1 g by mouth  daily.     . gabapentin (NEURONTIN) 600 MG tablet TAKE 1 TABLET BY MOUTH TWICE A DAY 180 tablet 3  . glimepiride (AMARYL) 2 MG tablet 2 tab in the morning and  1/2 tab in the evening. 270 tablet 1  . hydrochlorothiazide (HYDRODIURIL) 50 MG tablet Take 1 tablet (50 mg total) by mouth daily. 90 tablet 1  . levothyroxine (SYNTHROID) 112 MCG tablet Take 1 tablet (112 mcg total) by mouth daily. 90 tablet 0  . losartan (COZAAR) 100 MG tablet TAKE 1 TABLET BY MOUTH EVERY DAY 90 tablet 2  . metFORMIN (GLUCOPHAGE) 1000 MG tablet Take 1 tablet (1,000 mg total) by mouth 2 (two) times daily with a meal. 180 tablet 5  . nitroGLYCERIN (NITROSTAT) 0.4 MG SL tablet Place 1 tablet (0.4 mg total) under the tongue every 5 (five) minutes x 3 doses as needed for chest pain. 25 tablet 4  . potassium chloride (K-DUR) 10 MEQ tablet TAKE 1 TABLET BY MOUTH EVERY DAY 30 tablet 5  . prasugrel (EFFIENT) 10 MG TABS tablet TAKE 1 TABLET BY MOUTH EVERY DAY 90 tablet 3  . rosuvastatin (CRESTOR) 10 MG tablet Take 1 tablet (10 mg total) by mouth daily. Please make yearly appt with Dr. Johnsie Cancel for October for future refills. 1st attempt 90 tablet 0  . venlafaxine XR (EFFEXOR XR) 75 MG 24 hr capsule Take 3 capsules (225 mg total) by mouth daily with breakfast. 270 capsule 1  . SUPREP BOWEL PREP KIT 17.5-3.13-1.6 GM/177ML SOLN Take 1 kit by mouth as directed. For colonoscopy prep 354 mL 0   No current facility-administered medications for this visit.       ___________________________________________________________________ Objective   Exam:  BP 128/68    Pulse 68   Temp 98.1 F (36.7 C)   Ht _0  (1.676 m)   Wt 184 lb (83.5 kg)   BMI 29.70 kg/m    General: Well-appearing, pleasant and conversational  Eyes: sclera anicteric, no redness  ENT: oral mucosa moist without lesions, no cervical or supraclavicular lymphadenopathy  CV: RRR without murmur, S1/S2, no JVD, no peripheral edema  Resp: clear to auscultation bilaterally, normal RR and effort noted  GI: soft, no tenderness, with active bowel sounds. No guarding or palpable organomegaly noted.  Skin; warm and dry, no rash or jaundice noted  Neuro: awake, alert and oriented x 3. Normal gross motor function and fluent speech  Labs:  CBC Latest Ref Rng & Units 04/12/2019 02/12/2019 08/10/2018  WBC 3.4 - 10.8 x10E3/uL 8.9 5.5 6.5  Hemoglobin 11.1 - 15.9 g/dL 12.0 11.9 12.9  Hematocrit 34.0 - 46.6 % 34.9 35.6 37.4  Platelets 150 - 450 x10E3/uL 171 136(L) 188     Radiologic Studies:  Nuc med cardiac study 2018 - nml LVEF and no ischemia  Assessment: Encounter Diagnoses  Name Primary?  . Personal history of colonic polyps Yes  . Atherosclerosis of native coronary artery of native heart with stable angina pectoris ([facility])   . Long term (current) use of antithrombotics/antiplatelets     Unfortunately, we cannot know the details of colon polyp(s) removed prior to the 2012 exam.  I feel we must err on the side of caution that she may have had an advanced adenoma, in which case a colonoscopy interval shorter than 10 years would be in keeping with current guidelines.  I offered her colonoscopy, and she is happy to have that done, as she has become increasingly concerned about the issue.  Risks and benefits  were reviewed.  The benefits and risks of the planned procedure were described in detail with the patient or (when appropriate) their health care proxy.  Risks were outlined as including, but not limited to, bleeding, infection, perforation, adverse medication reaction leading to  cardiac or pulmonary decompensation, pancreatitis (if ERCP).  The limitation of incomplete mucosal visualization was also discussed.  No guarantees or warranties were given.  Patient at increased risk for cardiopulmonary complications of procedure due to medical comorbidities.  He understands the small but real risk of cardiac event being off Effient 5 days prior to the procedure and perhaps a day or 2 afterwards depending upon any polyps that may be removed.  She was agreeable, and we will communicate with a cardiologist about this as well.  She had received a letter that it was time for a follow-up visit with them, and plans to call them in the next couple of days to make that appointment.   Thank you for the courtesy of this consult.  Please call me with any questions or concerns.  Nelida Meuse III  CC: Referring provider noted above

## 2019-05-01 NOTE — Telephone Encounter (Signed)
Request for surgical clearance:     Endoscopy Procedure  What type of surgery is being performed?     colonoscopy  When is this surgery scheduled?     05/28/19  What type of clearance is required ?   Pharmacy  Are there any medications that need to be held prior to surgery and how long? Effient, 5 days  Practice name and name of physician performing surgery?      [facility]  What is your office phone and fax number?      Phone- [phone]  [phone]  Anesthesia type (None, local, MAC, general) ?       MAC

## 2019-05-01 NOTE — Patient Instructions (Signed)
You have been scheduled for a colonoscopy. Please follow written instructions given to you at your visit today.  Please pick up your prep supplies at the pharmacy within the next 1-3 days. If you use inhalers (even only as needed), please bring them with you on the day of your procedure. Your physician has requested that you go to www.startemmi.com and enter the access code given to you at your visit today. This web site gives a general overview about your procedure. However, you should still follow specific instructions given to you by our office regarding your preparation for the procedure.  You will be contaced by our office prior to your procedure for directions on holding your Effient.  If you do not hear from our office 1 week prior to your scheduled procedure, please call [phone] to discuss.  Make sure to call cardiology to set up an appointment with Dr Johnsie Cancel for routine follow up.  If you are age 39 or older, your body mass index should be between 23-30. Your Body mass index is 29.7 kg/m. If this is out of the aforementioned range listed, please consider follow up with your Primary Care Provider.  If you are age 25 or younger, your body mass index should be between 19-25. Your Body mass index is 29.7 kg/m. If this is out of the aformentioned range listed, please consider follow up with your Primary Care Provider.

## 2019-05-01 NOTE — Telephone Encounter (Signed)
   Primary Cardiologist: Jenkins Rouge, MD  Chart reviewed as part of pre-operative protocol coverage. Request received to hold effient for colonoscopy.   Per Dr. Johnsie Cancel: OK to hold effient for 5 days prior to procedure.   I will route this recommendation to the requesting party via Epic fax function and remove from pre-op pool.  Please call with questions.  Tami Lin Fredick Schlosser, PA 05/01/2019, 3:13 PM

## 2019-05-01 NOTE — Telephone Encounter (Signed)
-----   Message -----  From: Josue Hector, MD  Sent: 05/01/2019 10:32 AM EDT  To: Doran Stabler, MD   Ok to hold Effient for 5 days before procedure  ----- Message -----  From: Doran Stabler, MD  Sent: 05/01/2019 10:09 AM EDT  To: Josue Hector, MD   Mutual patient   - HD

## 2019-05-15 ENCOUNTER — Other Ambulatory Visit: Payer: Self-pay

## 2019-05-15 MED ORDER — DIMETHYL FUMARATE 120 MG PO CPDR: 120.0000 mg | DELAYED_RELEASE_CAPSULE | Freq: Two times a day (BID) | ORAL | 5 refills | Status: DC

## 2019-05-24 ENCOUNTER — Encounter: Payer: Self-pay | Admitting: Gastroenterology

## 2019-05-25 ENCOUNTER — Other Ambulatory Visit: Payer: Self-pay | Admitting: Cardiovascular Disease

## 2019-05-28 ENCOUNTER — Ambulatory Visit (AMBULATORY_SURGERY_CENTER): Payer: Medicare Other | Admitting: Gastroenterology

## 2019-05-28 ENCOUNTER — Other Ambulatory Visit: Payer: Self-pay

## 2019-05-28 ENCOUNTER — Other Ambulatory Visit: Payer: Self-pay | Admitting: Gastroenterology

## 2019-05-28 ENCOUNTER — Encounter: Payer: Self-pay | Admitting: Gastroenterology

## 2019-05-28 VITALS — BP 139/64 | HR 67 | Temp 96.4°F | Resp 11 | Ht 66.0 in | Wt 184.0 lb

## 2019-05-28 DIAGNOSIS — D122 Benign neoplasm of ascending colon: Secondary | ICD-10-CM | POA: Diagnosis not present

## 2019-05-28 DIAGNOSIS — Z86010 Personal history of colonic polyps: Secondary | ICD-10-CM | POA: Diagnosis not present

## 2019-05-28 MED ORDER — SODIUM CHLORIDE 0.9 % IV SOLN: 500.0000 mL | Freq: Once | INTRAVENOUS | Status: DC

## 2019-05-28 NOTE — Patient Instructions (Signed)
Please read handouts provided. Resume Effient at prior dose in 5 days. Await pathology results.        YOU HAD AN ENDOSCOPIC PROCEDURE TODAY AT [facility]:   Refer to the procedure report that was given to you for any specific questions about what was found during the examination.  If the procedure report does not answer your questions, please call your gastroenterologist to clarify.  If you requested that your care partner not be given the details of your procedure findings, then the procedure report has been included in a sealed envelope for you to review at your convenience later.  YOU SHOULD EXPECT: Some feelings of bloating in the abdomen. Passage of more gas than usual.  Walking can help get rid of the air that was put into your GI tract during the procedure and reduce the bloating. If you had a lower endoscopy (such as a colonoscopy or flexible sigmoidoscopy) you may notice spotting of blood in your stool or on the toilet paper. If you underwent a bowel prep for your procedure, you may not have a normal bowel movement for a few days.  Please Note:  You might notice some irritation and congestion in your nose or some drainage.  This is from the oxygen used during your procedure.  There is no need for concern and it should clear up in a day or so.  SYMPTOMS TO REPORT IMMEDIATELY:   Following lower endoscopy (colonoscopy or flexible sigmoidoscopy):  Excessive amounts of blood in the stool  Significant tenderness or worsening of abdominal pains  Swelling of the abdomen that is new, acute  Fever of 100F or higher   For urgent or emergent issues, a gastroenterologist can be reached at any hour by calling [phone].   DIET:  We do recommend a small meal at first, but then you may proceed to your regular diet.  Drink plenty of fluids but you should avoid alcoholic beverages for 24 hours.  ACTIVITY:  You should plan to take it easy for the rest of today and you  should NOT DRIVE or use heavy machinery until tomorrow (because of the sedation medicines used during the test).    FOLLOW UP: Our staff will call the number listed on your records 48-72 hours following your procedure to check on you and address any questions or concerns that you may have regarding the information given to you following your procedure. If we do not reach you, we will leave a message.  We will attempt to reach you two times.  During this call, we will ask if you have developed any symptoms of COVID 19. If you develop any symptoms (ie: fever, flu-like symptoms, shortness of breath, cough etc.) before then, please call [phone].  If you test positive for Covid 19 in the 2 weeks post procedure, please call and report this information to [place].    If any biopsies were taken you will be contacted by phone or by letter within the next 1-3 weeks.  Please call us at [phone] if you have not heard about the biopsies in 3 weeks.    SIGNATURES/CONFIDENTIALITY: You and/or your care partner have signed paperwork which will be entered into your electronic medical record.  These signatures attest to the fact that that the information above on your After Visit Summary has been reviewed and is understood.  Full responsibility of the confidentiality of this discharge information lies with you and/or your care-partner.

## 2019-05-28 NOTE — Progress Notes (Signed)
A and O x3. Report to RN. Tolerated MAC anesthesia well.

## 2019-05-28 NOTE — Op Note (Addendum)
[facility] Patient Name: Daisy Bennett Procedure Date: 05/28/2019 2:22 PM MRN: [id number] Endoscopist: Mallie Mussel L. Loletha Carrow , MD Age: 63 Referring MD:  Date of Birth: 1956-04-22 Gender: Female Account #: [id number] Procedure:                Colonoscopy Indications:              surveillance: Personal history of colonic polyps                            (no polyps 2012 at outside clinic; polyp(s) of                            unknown size, type on colonoscopy prior to 2012) Medicines:                Monitored Anesthesia Care Procedure:                Pre-Anesthesia Assessment:                           - Prior to the procedure, a History and Physical                            was performed, and patient medications and                            allergies were reviewed. The patient's tolerance of                            previous anesthesia was also reviewed. The risks                            and benefits of the procedure and the sedation                            options and risks were discussed with the patient.                            All questions were answered, and informed consent                            was obtained. Prior Anticoagulants: The patient has                            taken Effient, last dose was 5 days prior to                            procedure (remained on aspirin). ASA Grade                            Assessment: III - A patient with severe systemic                            disease. After reviewing the risks and benefits,  the patient was deemed in satisfactory condition to                            undergo the procedure.                           After obtaining informed consent, the colonoscope                            was passed under direct vision. Throughout the                            procedure, the patient's blood pressure, pulse, and                            oxygen saturations were monitored  continuously. The                            Colonoscope was introduced through the anus and                            advanced to the the cecum, identified by                            appendiceal orifice and ileocecal valve. The                            colonoscopy was performed with difficulty due to a                            redundant colon and significant looping. Successful                            completion of the procedure was aided by using                            manual pressure. The quality of the bowel                            preparation was good. Scope In: 2:35:20 PM Scope Out: 3:10:02 PM Scope Withdrawal Time: 0 hours 22 minutes 54 seconds  Total Procedure Duration: 0 hours 34 minutes 42 seconds  Findings:                 The perianal and digital rectal examinations were                            normal.                           A diminutive polyp was found in the proximal                            ascending colon. The polyp was sessile. The polyp  was removed with a cold biopsy forceps. Resection                            and retrieval were complete.                           A 16 mm polyp was found in the mid ascending colon.                            The polyp was sessile. The polyp was removed with a                            piecemeal technique using a hot and cold snare.                            Resection and retrieval were complete.                           A 6 mm polyp was found in the mid ascending colon.                            The polyp was sessile. The polyp was removed with a                            cold snare. Resection and retrieval were complete.                           The colon (entire examined portion) was                            significantly redundant, causing scope looping.                           The exam was otherwise without abnormality on                            direct and  retroflexion views. Complications:            No immediate complications. Estimated Blood Loss:     Estimated blood loss was minimal. Impression:               - One diminutive polyp in the proximal ascending                            colon, removed with a cold biopsy forceps. Resected                            and retrieved.                           - One 16 mm polyp in the mid ascending colon,                            removed piecemeal using a hot snare.  Resected and                            retrieved.                           - One 6 mm polyp in the mid ascending colon,                            removed with a cold snare. Resected and retrieved.                           - Redundant colon.                           - The examination was otherwise normal on direct                            and retroflexion views. Recommendation:           - Patient has a contact number available for                            emergencies. The signs and symptoms of potential                            delayed complications were discussed with the                            patient. Return to normal activities tomorrow.                            Written discharge instructions were provided to the                            patient.                           - Resume previous diet.                           - Resume Effient at prior dose in 5 days.                           - Await pathology results.                           - Repeat colonoscopy is recommended for                            surveillance. The colonoscopy date will be                            determined after pathology results from today's                            exam become available for review.  Daisy Bennett L. Loletha Carrow, MD 05/28/2019 3:20:35 PM This report has been signed electronically.

## 2019-05-28 NOTE — Progress Notes (Signed)
Pt's states no medical or surgical changes since previsit or office visit. 

## 2019-05-28 NOTE — Progress Notes (Signed)
Called to room to assist during endoscopic procedure.  Patient ID and intended procedure confirmed with present staff. Received instructions for my participation in the procedure from the performing physician.  

## 2019-05-30 ENCOUNTER — Telehealth: Payer: Self-pay

## 2019-05-30 NOTE — Telephone Encounter (Signed)
First post procedure follow up call, no answer 

## 2019-05-30 NOTE — Telephone Encounter (Signed)
  Follow up Call-  Call back number 05/28/2019  Post procedure Call Back phone  # [phone]  Permission to leave phone message Yes  Some recent data might be hidden     Patient questions:  Do you have a fever, pain , or abdominal swelling? No. Pain Score  0 *  Have you tolerated food without any problems? Yes.    Have you been able to return to your normal activities? Yes.    Do you have any questions about your discharge instructions: Diet   No. Medications  No. Follow up visit  No.  Do you have questions or concerns about your Care? No.  Actions: * If pain score is 4 or above: 1. No action needed, pain <4.Have you developed a fever since your procedure? no  2.   Have you had an respiratory symptoms (SOB or cough) since your procedure? no  3.   Have you tested positive for COVID 19 since your procedure no  4.   Have you had any family members/close contacts diagnosed with the COVID 19 since your procedure?  no   If yes to any of these questions please route to Joylene John, RN and Alphonsa Gin, Therapist, sports.

## 2019-06-04 ENCOUNTER — Encounter: Payer: Self-pay | Admitting: Gastroenterology

## 2019-06-15 NOTE — Progress Notes (Deleted)
Patient ID: Daisy Bennett, female   DOB: 19-Dec-1955, 63 y.o.   MRN: [id number]    Daisy Bennett is seen f/u CAD  post stenting 2011  She had a non ST elevation MI with subsequent stenting of an OM. She has diffuse 3VD with a total right that is collateralized. She had smoewhat atypical presentation with her DM. Marland KitchenLast myovue done 06/16/18 low risk non ischemic EF 68%  Lots of dermatology issues had multiple lesions removed from right leg and thigh  October 2017:  Fell and hit head with subdural Memory issues since then. 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 New grand baby January 2020  Unfortunately with stress is smoking a bit again  No chest pain needs new nitro   ***   ROS: Denies fever, malais, weight loss, blurry vision, decreased visual acuity, cough, sputum, SOB, hemoptysis, pleuritic pain, palpitaitons, heartburn, abdominal pain, melena, lower extremity edema, claudication, or rash.  All other systems reviewed and negative  General: There were no vitals taken for this visit. Affect appropriate Healthy:  appears stated age 83: normal Neck supple with no adenopathy JVP normal no bruits no thyromegaly Lungs clear with no wheezing and good diaphragmatic motion Heart:  S1/S2 no murmur, no rub, gallop or click PMI normal Abdomen: benighn, BS positve, no tenderness, no AAA no bruit.  No HSM or HJR Distal pulses intact with no bruits No edema Neuro non-focal Skin warm and dry No muscular weakness   Current Outpatient Medications  Medication Sig Dispense Refill  . amitriptyline (ELAVIL) 75 MG tablet Take 1 tablet (75 mg total) by mouth daily. 90 tablet 1  . amLODipine (NORVASC) 10 MG tablet Take 1 tablet (10 mg total) by mouth daily. 90 tablet 3  . aspirin EC 81 MG tablet Take 1 tablet (81 mg total) daily by mouth.    . carvedilol (COREG) 6.25 MG tablet  Take 1 tablet (6.25 mg total) by mouth 2 (two) times daily. 180 tablet 3  . cholecalciferol (VITAMIN D) 1000 units tablet Take 1,000 Units by mouth daily.    . Dimethyl Fumarate (TECFIDERA) 120 MG CPDR Take 120 mg by mouth 2 (two) times daily. 60 capsule 5  . esomeprazole (NEXIUM) 20 MG capsule Take 1 capsule (20 mg total) by mouth daily at 12 noon. 90 capsule 1  . fenofibrate 160 MG tablet TAKE 1 TABLET (160 MG TOTAL) BY MOUTH DAILY. OFFICE VISIT NEEDED 90 tablet 3  . Ferrous Sulfate (IRON) 325 (65 FE) MG TABS Take 325 mg by mouth daily.     . fish oil-omega-3 fatty acids 1000 MG capsule Take 1 g by mouth daily.     [name] gabapentin (NEURONTIN) 600 MG tablet TAKE 1 TABLET BY MOUTH TWICE A DAY 180 tablet 3  . glimepiride (AMARYL) 2 MG tablet 2 tab in the morning and  1/2 tab in the evening. 270 tablet 1  . hydrochlorothiazide (HYDRODIURIL) 50 MG tablet Take 1 tablet (50 mg total) by mouth daily. 90 tablet 1  . levothyroxine (SYNTHROID) 112 MCG tablet Take 1 tablet (112 mcg total) by mouth daily. 90 tablet 0  . losartan (COZAAR) 100 MG tablet TAKE 1 TABLET BY MOUTH EVERY DAY 90 tablet 2  . metFORMIN (GLUCOPHAGE) 1000 MG tablet Take 1 tablet (1,000 mg total) by mouth 2 (two) times daily with a meal. 180 tablet 5  . nitroGLYCERIN (NITROSTAT)  0.4 MG SL tablet Place 1 tablet (0.4 mg total) under the tongue every 5 (five) minutes x 3 doses as needed for chest pain. 25 tablet 4  . potassium chloride (K-DUR) 10 MEQ tablet TAKE 1 TABLET BY MOUTH EVERY DAY 30 tablet 5  . prasugrel (EFFIENT) 10 MG TABS tablet TAKE 1 TABLET BY MOUTH EVERY DAY 30 tablet 0  . rosuvastatin (CRESTOR) 10 MG tablet Take 1 tablet (10 mg total) by mouth daily. Please make yearly appt with Dr. Johnsie Cancel for October for future refills. 1st attempt 90 tablet 0  . venlafaxine XR (EFFEXOR XR) 75 MG 24 hr capsule Take 3 capsules (225 mg total) by mouth daily with breakfast. 270 capsule 1   No current facility-administered medications for this  visit.     Allergies  Betaseron [interferon beta-1b], Provigil [modafinil], Topamax [topiramate], and Doxycycline  Electrocardiogram:  06/09/17 SR rate 68 normal 06/01/18 SR rate 80 nonspecific ST changes   Assessment and Plan  CAD: Collateralized RCA and stent to OM in 2011 no angina continue medical Rx  New nitro called in Low risk myovue 06/16/17 normal perfusion no ischemia EF 68%   HTN: Well controlled.  Continue current medications and low sodium Dash type diet.    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

## 2019-06-18 ENCOUNTER — Telehealth: Payer: Self-pay | Admitting: Cardiovascular Disease

## 2019-06-18 NOTE — Telephone Encounter (Signed)
Patient called stating her Husband has been exposed to Covid and wants to know if she should make her appointment virtual or reschedule.

## 2019-06-18 NOTE — Telephone Encounter (Signed)
Left message for patient to call back. Per Dr. Johnsie Cancel, if patient is not having any chest pain, he can see her in 3 months.

## 2019-06-19 NOTE — Telephone Encounter (Signed)
Patient sent mychart message stating she was fine to wait for a follow up in 3 months.

## 2019-06-20 ENCOUNTER — Other Ambulatory Visit: Payer: Self-pay | Admitting: Cardiovascular Disease

## 2019-06-20 ENCOUNTER — Ambulatory Visit: Payer: 59 | Admitting: Cardiovascular Disease

## 2019-07-03 ENCOUNTER — Other Ambulatory Visit: Payer: Self-pay

## 2019-07-04 ENCOUNTER — Ambulatory Visit (INDEPENDENT_AMBULATORY_CARE_PROVIDER_SITE_OTHER): Payer: Medicare Other | Admitting: Family Medicine

## 2019-07-04 ENCOUNTER — Encounter: Payer: Self-pay | Admitting: Family Medicine

## 2019-07-04 ENCOUNTER — Ambulatory Visit: Payer: PRIVATE HEALTH INSURANCE | Admitting: Family Medicine

## 2019-07-04 VITALS — BP 138/84 | HR 74 | Temp 97.9°F | Resp 16 | Ht 66.0 in | Wt 187.5 lb

## 2019-07-04 DIAGNOSIS — E782 Mixed hyperlipidemia: Secondary | ICD-10-CM

## 2019-07-04 DIAGNOSIS — I10 Essential (primary) hypertension: Secondary | ICD-10-CM

## 2019-07-04 DIAGNOSIS — E119 Type 2 diabetes mellitus without complications: Secondary | ICD-10-CM

## 2019-07-04 DIAGNOSIS — Z7902 Long term (current) use of antithrombotics/antiplatelets: Secondary | ICD-10-CM

## 2019-07-04 DIAGNOSIS — Z23 Encounter for immunization: Secondary | ICD-10-CM

## 2019-07-04 DIAGNOSIS — G35 Multiple sclerosis: Secondary | ICD-10-CM | POA: Diagnosis not present

## 2019-07-04 DIAGNOSIS — I25118 Atherosclerotic heart disease of native coronary artery with other forms of angina pectoris: Secondary | ICD-10-CM

## 2019-07-04 DIAGNOSIS — E039 Hypothyroidism, unspecified: Secondary | ICD-10-CM

## 2019-07-04 LAB — POCT GLYCOSYLATED HEMOGLOBIN (HGB A1C)
HbA1c POC (<> result, manual entry): 6 % (ref 4.0–5.6)
HbA1c, POC (controlled diabetic range): 6 % (ref 0.0–7.0)
HbA1c, POC (prediabetic range): 6 % (ref 5.7–6.4)
Hemoglobin A1C: 6 % — AB (ref 4.0–5.6)

## 2019-07-04 MED ORDER — LEVOTHYROXINE SODIUM 112 MCG PO TABS: 112.0000 ug | ORAL_TABLET | Freq: Every day | ORAL | 0 refills | Status: DC

## 2019-07-04 MED ORDER — ZOSTER VAC RECOMB ADJUVANTED 50 MCG/0.5ML IM SUSR: 0.5000 mL | Freq: Once | INTRAMUSCULAR | 0 refills | Status: DC

## 2019-07-04 MED ORDER — HYDROCHLOROTHIAZIDE 50 MG PO TABS: 50.0000 mg | ORAL_TABLET | Freq: Every day | ORAL | 1 refills | Status: DC

## 2019-07-04 MED ORDER — AMITRIPTYLINE HCL 100 MG PO TABS: 100.0000 mg | ORAL_TABLET | Freq: Every day | ORAL | 1 refills | Status: DC

## 2019-07-04 MED ORDER — METFORMIN HCL 1000 MG PO TABS: 1000.0000 mg | ORAL_TABLET | Freq: Two times a day (BID) | ORAL | 5 refills | Status: DC

## 2019-07-04 MED ORDER — POTASSIUM CHLORIDE ER 10 MEQ PO TBCR: 10.0000 meq | EXTENDED_RELEASE_TABLET | Freq: Every day | ORAL | 1 refills | Status: DC

## 2019-07-04 MED ORDER — VENLAFAXINE HCL ER 75 MG PO CP24: 225.0000 mg | ORAL_CAPSULE | Freq: Every day | ORAL | 1 refills | Status: DC

## 2019-07-04 MED ORDER — GLIMEPIRIDE 2 MG PO TABS: ORAL_TABLET | ORAL | 1 refills | Status: DC

## 2019-07-04 MED ORDER — ZOSTER VAC RECOMB ADJUVANTED 50 MCG/0.5ML IM SUSR: 0.5000 mL | Freq: Once | INTRAMUSCULAR | 1 refills | Status: AC

## 2019-07-04 MED ORDER — ESOMEPRAZOLE MAGNESIUM 20 MG PO CPDR: 20.0000 mg | DELAYED_RELEASE_CAPSULE | Freq: Every day | ORAL | 1 refills | Status: DC

## 2019-07-04 NOTE — Progress Notes (Signed)
Daisy Bennett , 05-May-1956, 63 y.o., female MRN: [id number] Patient Care Team    Relationship Specialty Notifications Start End  Ma Hillock, DO PCP - General Family Medicine  03/10/15   Josue Hector, MD PCP - Cardiology Cardiology Admissions 05/31/18   [place], Jennefer Bravo, MD Referring Physician Dermatology  02/10/16   Kathrynn Ducking, MD Consulting Physician Neurology  05/18/16   Linda Hedges, [place] Physician Obstetrics and Gynecology  06/04/16   Anell Barr, OD  Optometry  08/17/16     Chief Complaint  Patient presents with   Diabetes    Fasting. Would like Flu vaccine. Mammogram completed at [facility]. Will request copy. Is not up to date on eye exam.      Subjective:  Diabetes/morbid obesity: Patient presents for routine scheduled diabetes follow-up. Today she reports compliance with Amaryl  4 mg/18m daily and metformin 1000 mg BID and she is tolerating them. She is still  not checking her sugars. Patient denies dizziness, hyperglycemic or hypoglycemic events. Patient denies numbness, tingling in the extremities or nonhealing wounds of feet.  She is on gabapentin 600 mg BID.  POCT HgB A1C-->  6.5 --> 7.2--> 6.1 --> 6.0--> 6.4--> 6.5-->6.4--> 6.3>>> 7.2>>6.0>>6.0  today - PSV 10/2016--> competed series -  foot exam 02/28/2019 - Eye exam reports eye exam 03/2018 UTD.  Dr. RVenetia Constable yearly eye exams encouraged>> she will schedule>>reminded 2nd time - Microalbumin: on an ARB - flu shot UTD - administered today  Hypertension/hyperlipidemia: Today She reports complaince with her medication regimen of Crestor 10 mg daily, Effient 10 mg daily, losartan 100 mg daily, HCTZ 50 mg daily, fish oil 1000 mg daily, fenofibrate 160 mg daily Norvasc 10 mg, baby aspirin daily, Coreg 6.25 mg twice daily.  H/O CAD, NSTEMI, post stent 2011 ([facility])-collateralized RCA.  She is on Effient and baby aspirin.  She follows with Daisy Bennett  She did not take her medication  today.Patient denies chest pain, shortness of breath, dizziness or lower extremity edema.  BMP:  08/10/2018 within normal limits CBC: 08/10/2018 within normal limits Lipids: 05/18/18 WNL, tg mildly elevated TSH: 05/18/2018, mildly elevated 5.71 Diet: low sodium Exercise: routine  depression/MS:  Patient reports she is doing good on her current management.. She is tolerating the Effexor at 225 mg daily.  Reports compliance with amitriptyline 75 mg nightly.  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    [facility] 07/04/2019 02/15/2018 10/17/2017 06/17/2017 06/04/2016  Decreased Interest 0 0 0 0 0  Down, Depressed, Hopeless 0 0 0 0 0  PHQ - 2 Score 0 0 0 0 0  Altered sleeping - - 2 - -  Tired, decreased energy - - 0 - -  Change in appetite - - 1 - -  Feeling bad or failure about yourself  - - 0 - -  Trouble concentrating - - 0 - -  Moving slowly or fidgety/restless - - 0 - -  Suicidal thoughts - - 0 - -  PHQ-9 Score - - 3 - -    Allergies  Allergen Reactions   Betaseron [Interferon Beta-1b] Other (See Comments)    Increased LFT's   Provigil [Modafinil] Swelling    Tongue swelling and sores   Topamax [Topiramate]     Cognitive slowing   Doxycycline Other (See Comments)    Thrush,dizziness   Social History   Tobacco Use  Smoking status: Current Some Day Smoker    Packs/day: 1.00    Last attempt to quit: 12/16/2015    Years since quitting: 3.5   Smokeless tobacco: Never Used  Substance Use Topics   Alcohol use: Yes    Alcohol/week: 0.0 standard drinks    Comment: OCCASIONAL ALCOHOL USE.   Past Medical History:  Diagnosis Date   Basal cell carcinoma    Brain bleed ([place]) 2017   DM (diabetes mellitus) ([place])    Goiter, unspecified    History of colonic polyps 2012   Multiple sclerosis (HCC)    Non-ST elevated myocardial infarction (non-STEMI) (HCC)    SCCA (squamous cell carcinoma) of skin     right shin   SEMI (subendocardial myocardial infarction) ([place]) 09/29/09   TOTAL RCA WITH STENT TO OM BRANCH   Shingles    Squamous cell carcinoma 2019   lower ext   Tobacco abuse    Unspecified essential hypertension    Unspecified hypothyroidism    Past Surgical History:  Procedure Laterality Date   CATARACT SURGERY  12/10   CESAREAN SECTION     CORONARY ANGIOPLASTY WITH STENT PLACEMENT  2011   KNEE SURGERY     leg surgery     squamous ccell removed    TONSILLECTOMY     VESICOVAGINAL FISTULA CLOSURE W/ TAH     Family History  Problem Relation Age of Onset   Hypertension Mother    Heart failure Father    Diabetes Father    Aneurysm Brother        THORACIC/ABD ANEURYSM   Prostate cancer Brother 57   Diabetes Brother    Fibromyalgia Sister    Brain cancer Maternal Grandmother 37   Multiple sclerosis Neg Hx    Colon cancer Neg Hx    Esophageal cancer Neg Hx    Allergies as of 07/04/2019      Reactions   Betaseron [interferon Beta-1b] Other (See Comments)   Increased LFT's   Provigil [modafinil] Swelling   Tongue swelling and sores   Topamax [topiramate]    Cognitive slowing   Doxycycline Other (See Comments)   Thrush,dizziness      Medication List       Accurate as of July 04, 2019 11:59 PM. If you have any questions, ask your nurse or doctor.        amitriptyline 100 MG tablet Commonly known as: ELAVIL Take 1 tablet (100 mg total) by mouth daily. What changed:   medication strength  how much to take Changed by: Howard Pouch, DO   amLODipine 10 MG tablet Commonly known as: NORVASC Take 1 tablet (10 mg total) by mouth daily.   aspirin EC 81 MG tablet Take 1 tablet (81 mg total) daily by mouth.   carvedilol 6.25 MG tablet Commonly known as: COREG Take 1 tablet (6.25 mg total) by mouth 2 (two) times daily.   cholecalciferol 1000 units tablet Commonly known as: VITAMIN D Take 1,000 Units by mouth daily.   Dimethyl  Fumarate 120 MG Cpdr Commonly known as: Tecfidera Take 120 mg by mouth 2 (two) times daily.   esomeprazole 20 MG capsule Commonly known as: NEXIUM Take 1 capsule (20 mg total) by mouth daily at 12 noon.   fenofibrate 160 MG tablet TAKE 1 TABLET (160 MG TOTAL) BY MOUTH DAILY. OFFICE VISIT NEEDED   fish oil-omega-3 fatty acids 1000 MG capsule Take 1 g by mouth daily.   gabapentin 600 MG tablet Commonly known as: NEURONTIN TAKE  1 TABLET BY MOUTH TWICE A DAY   glimepiride 2 MG tablet Commonly known as: AMARYL 2 tab in the morning and  1/2 tab in the evening.   hydrochlorothiazide 50 MG tablet Commonly known as: HYDRODIURIL Take 1 tablet (50 mg total) by mouth daily.   Iron 325 (65 Fe) MG Tabs Take 325 mg by mouth daily.   levothyroxine 112 MCG tablet Commonly known as: SYNTHROID Take 1 tablet (112 mcg total) by mouth daily.   losartan 100 MG tablet Commonly known as: COZAAR TAKE 1 TABLET BY MOUTH EVERY DAY   metFORMIN 1000 MG tablet Commonly known as: GLUCOPHAGE Take 1 tablet (1,000 mg total) by mouth 2 (two) times daily with a meal.   nitroGLYCERIN 0.4 MG SL tablet Commonly known as: Nitrostat Place 1 tablet (0.4 mg total) under the tongue every 5 (five) minutes x 3 doses as needed for chest pain.   potassium chloride 10 MEQ tablet Commonly known as: KLOR-CON Take 1 tablet (10 mEq total) by mouth daily.   prasugrel 10 MG Tabs tablet Commonly known as: EFFIENT TAKE 1 TABLET BY MOUTH EVERY DAY   rosuvastatin 10 MG tablet Commonly known as: CRESTOR Take 1 tablet (10 mg total) by mouth daily. Please make yearly appt with Dr. Johnsie Bennett for October for future refills. 1st attempt   venlafaxine XR 75 MG 24 hr capsule Commonly known as: Effexor XR Take 3 capsules (225 mg total) by mouth daily with breakfast.   Zoster Vaccine Adjuvanted injection Commonly known as: SHINGRIX Inject 0.5 mLs into the muscle once for 1 dose. Rpt injection once in 2-6 mos. For a series of  2. Started by: Howard Pouch, DO       All past medical history, surgical history, allergies, family history, immunizations andmedications were updated in the EMR today and reviewed under the history and medication portions of their EMR.     ROS: Negative, with the exception of above mentioned in HPI   Objective:  BP 138/84 (BP Location: Left Arm, Patient Position: Sitting, Cuff Size: Normal)    Pulse 74    Temp 97.9 F (36.6 C) (Temporal)    Resp 16    Ht _0  (1.676 m)    Wt 187 lb 8 oz (85 kg)    SpO2 100%    BMI 30.26 kg/m  Body mass index is 30.26 kg/m. Gen: Afebrile. No acute distress.  Nontoxic.  Very pleasant obese Caucasian female. HENT: AT. [place].  No cough or hoarseness. Eyes:Pupils Equal Round Reactive to light, Extraocular movements intact,  Conjunctiva without redness, discharge or icterus. Neck/lymp/endocrine: Supple, no lymphadenopathy, no thyromegaly CV: RRR 1/6 SM, no edema, +2/4 P posterior tibialis pulses Chest: CTAB, no wheeze or crackles Abd: Soft.  Flat. NTND. BS present.  Neuro:  Normal gait. PERLA. EOMi. Alert. Oriented x3  Psych: Normal affect, dress and demeanor. Normal speech. Normal thought content and judgment.   No exam data present No results found. No results found for this or any previous visit (from the past 24 hour(s)).  Assessment/Plan: MYSTY KIELTY is a 63 y.o. female present for OV for  Diabetes mellitus without complication (HCC)/Morbid obesity - Stable.  -Refilled/continue Amaryl to 4 mg in the morning and 1 mg in the evening -Refilled/continue  metformin 1000 mg BID.   - increase exercise regimen and continue dietary modification.  -Refilled/continue gabapentin 600 mg nightly  POCT HgB A1C-->  6.5 --> 7.2--> 6.1 --> 6.0--> 6.4--> 6.5-->6.4--> 6.3>>> 7.2>>6.0>>6.0  today - PSV  10/2016--> competed series -  foot exam 02/28/2019 - Eye exam reports eye exam 03/2018 UTD.  Dr. Venetia Constable, yearly eye exams encouraged>> she will  schedule>>reminded 2nd time - Microalbumin: on an ARB - flu shot UTD - administered today -3-65-monthfollow-up  Hypertension/hyperlipidemia/Antiplatelet or antithrombotic long-term use/athersclerosis/hypokalemia/s/p stent placement:  - Stable. -Lipid, CMP, TSH collected today - follows with cardiology, Dr. NJohnsie Cancelwho prescribes medications.  - low salt, increase exercise.  - Continue to monitor at home if above 140/90 will need to see sooner and discuss.  -  Kdur 10 meq/d -Continue fenofibrate , statin.  - continue Effient>> asked her discuss with her cardiologist the continued use of effient and asa with her multiple falls and subdural hematoma she is at a high bleeding risk.  - maintain routine cardiology follow ups.   depression/hypothyroid//MS:  -Stable.  Continue/refilled effexor 225 mg QD.  - TSH normal.  - Tecfidera prescribed by neurology.  Gabapentin prescribed by neurology. -Continue/refilled amitriptyline 75 mg nightly, refills provided.    Routine follow-up every 4 months.    Reviewed expectations re: course of current medical issues.  Discussed self-management of symptoms.  Outlined signs and symptoms indicating need for more acute intervention.  Patient verbalized understanding and all questions were answered.  Patient received an After-Visit Summary.    Orders Placed This Encounter  Procedures   Flu Vaccine QUAD High Dose(Fluad)   Lipid panel   Comp Met (CMET)   TSH   POCT HgB A1C     Note is dictated utilizing voice recognition software. Although note has been proof read prior to signing, occasional typographical errors still can be missed. If any questions arise, please do not hesitate to call for verification.   electronically signed by:  RHoward Pouch DO  [place]

## 2019-07-04 NOTE — Patient Instructions (Addendum)
A1c looks great at 6.0 Increased amitriptyline to 100 mg to help with sleep.  Shingrix script printed for you. Remember it is a series of 2 ( 2-6 mos after 1st shot) All other medications we fill for you are refilled also.    Diabetes Mellitus and Exercise Exercising regularly is important for your overall health, especially when you have diabetes (diabetes mellitus). Exercising is not only about losing weight. It has many other health benefits, such as increasing muscle strength and bone density and reducing body fat and stress. This leads to improved fitness, flexibility, and endurance, all of which result in better overall health. Exercise has additional benefits for people with diabetes, including:  Reducing appetite.  Helping to lower and control blood glucose.  Lowering blood pressure.  Helping to control amounts of fatty substances (lipids) in the blood, such as cholesterol and triglycerides.  Helping the body to respond better to insulin (improving insulin sensitivity).  Reducing how much insulin the body needs.  Decreasing the risk for heart disease by: ? Lowering cholesterol and triglyceride levels. ? Increasing the levels of good cholesterol. ? Lowering blood glucose levels. What is my activity plan? Your health care provider or certified diabetes educator can help you make a plan for the type and frequency of exercise (activity plan) that works for you. Make sure that you:  Do at least 150 minutes of moderate-intensity or vigorous-intensity exercise each week. This could be brisk walking, biking, or water aerobics. ? Do stretching and strength exercises, such as yoga or weightlifting, at least 2 times a week. ? Spread out your activity over at least 3 days of the week.  Get some form of physical activity every day. ? Do not go more than 2 days in a row without some kind of physical activity. ? Avoid being inactive for more than 30 minutes at a time. Take frequent breaks  to walk or stretch.  Choose a type of exercise or activity that you enjoy, and set realistic goals.  Start slowly, and gradually increase the intensity of your exercise over time. What do I need to know about managing my diabetes?   Check your blood glucose before and after exercising. ? If your blood glucose is 240 mg/dL (13.3 mmol/L) or higher before you exercise, check your urine for ketones. If you have ketones in your urine, do not exercise until your blood glucose returns to normal. ? If your blood glucose is 100 mg/dL (5.6 mmol/L) or lower, eat a snack containing 15-20 grams of carbohydrate. Check your blood glucose 15 minutes after the snack to make sure that your level is above 100 mg/dL (5.6 mmol/L) before you start your exercise.  Know the symptoms of low blood glucose (hypoglycemia) and how to treat it. Your risk for hypoglycemia increases during and after exercise. Common symptoms of hypoglycemia can include: ? Hunger. ? Anxiety. ? Sweating and feeling clammy. ? Confusion. ? Dizziness or feeling light-headed. ? Increased heart rate or palpitations. ? Blurry vision. ? Tingling or numbness around the mouth, lips, or tongue. ? Tremors or shakes. ? Irritability.  Keep a rapid-acting carbohydrate snack available before, during, and after exercise to help prevent or treat hypoglycemia.  Avoid injecting insulin into areas of the body that are going to be exercised. For example, avoid injecting insulin into: ? The arms, when playing tennis. ? The legs, when jogging.  Keep records of your exercise habits. Doing this can help you and your health care provider adjust your diabetes  management plan as needed. Write down: ? Food that you eat before and after you exercise. ? Blood glucose levels before and after you exercise. ? The type and amount of exercise you have done. ? When your insulin is expected to peak, if you use insulin. Avoid exercising at times when your insulin is  peaking.  When you start a new exercise or activity, work with your health care provider to make sure the activity is safe for you, and to adjust your insulin, medicines, or food intake as needed.  Drink plenty of water while you exercise to prevent dehydration or heat stroke. Drink enough fluid to keep your urine clear or pale yellow. Summary  Exercising regularly is important for your overall health, especially when you have diabetes (diabetes mellitus).  Exercising has many health benefits, such as increasing muscle strength and bone density and reducing body fat and stress.  Your health care provider or certified diabetes educator can help you make a plan for the type and frequency of exercise (activity plan) that works for you.  When you start a new exercise or activity, work with your health care provider to make sure the activity is safe for you, and to adjust your insulin, medicines, or food intake as needed. This information is not intended to replace advice given to you by your health care provider. Make sure you discuss any questions you have with your health care provider. Document Released: 10/09/2003 Document Revised: 02/10/2017 Document Reviewed: 12/29/2015 Elsevier Patient Education  2020 [employer].

## 2019-07-05 ENCOUNTER — Telehealth: Payer: Self-pay | Admitting: Family Medicine

## 2019-07-05 DIAGNOSIS — N183 Chronic kidney disease, stage 3 unspecified: Secondary | ICD-10-CM | POA: Insufficient documentation

## 2019-07-05 LAB — LIPID PANEL
Cholesterol: 160 mg/dL (ref <200)
HDL: 70 mg/dL (ref >=50)
LDL Cholesterol (Calc): 73 mg/dL (calc)
Non-HDL Cholesterol (Calc): 90 mg/dL (calc) (ref <130)
Total CHOL/HDL Ratio: 2.3 (calc) (ref <5.0)
Triglycerides: 90 mg/dL (ref <150)

## 2019-07-05 LAB — COMPREHENSIVE METABOLIC PANEL
AG Ratio: 1.8 (calc) (ref 1.0–2.5)
ALT: 13 U/L (ref 6–29)
AST: 14 U/L (ref 10–35)
Albumin: 4.6 g/dL (ref 3.6–5.1)
Alkaline phosphatase (APISO): 67 U/L (ref 37–153)
BUN/Creatinine Ratio: 15 (calc) (ref 6–22)
BUN: 18 mg/dL (ref 7–25)
CO2: 27 mmol/L (ref 20–32)
Calcium: 9.9 mg/dL (ref 8.6–10.4)
Chloride: 101 mmol/L (ref 98–110)
Creat: 1.19 mg/dL — ABNORMAL HIGH (ref 0.50–0.99)
Globulin: 2.5 g/dL (calc) (ref 1.9–3.7)
Glucose, Bld: 124 mg/dL — ABNORMAL HIGH (ref 65–99)
Potassium: 4 mmol/L (ref 3.5–5.3)
Sodium: 140 mmol/L (ref 135–146)
Total Bilirubin: 0.3 mg/dL (ref 0.2–1.2)
Total Protein: 7.1 g/dL (ref 6.1–8.1)

## 2019-07-05 LAB — TSH: TSH: 1.13 mIU/L (ref 0.40–4.50)

## 2019-07-05 NOTE — Telephone Encounter (Signed)
Pt was called and given lab results, she verbalized understanding.  

## 2019-07-05 NOTE — Telephone Encounter (Signed)
Please inform patient the following information: Her cholesterol panel looks great. Her liver function and electrolytes are normal. Blood counts are in normal range. Her kidney function is mildly decreased from prior.  This is possibly secondary to mild dehydration since she is on a diuretic/HCTZ and she was in a fasting state.  However we will need to monitor her kidney function a little closer to ensure it does not continue to worsen.  We will repeat this lab at her next routine office visit. In the meantime, since her diabetes is very well controlled I would like her to decrease her Metformin to once daily dosing.  Decreasing the Metformin and increasing her water intake will hopefully return her kidney function back to normal-and still keep her diabetes well controlled.

## 2019-07-09 MED ORDER — LOSARTAN POTASSIUM 100 MG PO TABS: 100.0000 mg | ORAL_TABLET | Freq: Every day | ORAL | 2 refills | Status: DC

## 2019-07-19 ENCOUNTER — Other Ambulatory Visit: Payer: Self-pay | Admitting: Cardiovascular Disease

## 2019-07-23 ENCOUNTER — Other Ambulatory Visit: Payer: Self-pay | Admitting: Cardiovascular Disease

## 2019-08-02 ENCOUNTER — Ambulatory Visit: Payer: PRIVATE HEALTH INSURANCE | Admitting: Family Medicine

## 2019-08-03 DIAGNOSIS — U071 COVID-19: Secondary | ICD-10-CM

## 2019-08-03 HISTORY — DX: COVID-19: U07.1

## 2019-08-08 ENCOUNTER — Encounter: Payer: Self-pay | Admitting: Family Medicine

## 2019-08-08 ENCOUNTER — Ambulatory Visit (INDEPENDENT_AMBULATORY_CARE_PROVIDER_SITE_OTHER): Payer: Medicare Other | Admitting: Family Medicine

## 2019-08-08 ENCOUNTER — Other Ambulatory Visit: Payer: Self-pay

## 2019-08-08 VITALS — BP 139/84 | Temp 98.0°F | Ht 66.0 in | Wt 180.0 lb

## 2019-08-08 DIAGNOSIS — R509 Fever, unspecified: Secondary | ICD-10-CM | POA: Diagnosis not present

## 2019-08-08 DIAGNOSIS — R059 Cough, unspecified: Secondary | ICD-10-CM

## 2019-08-08 DIAGNOSIS — B349 Viral infection, unspecified: Secondary | ICD-10-CM

## 2019-08-08 DIAGNOSIS — Z7189 Other specified counseling: Secondary | ICD-10-CM

## 2019-08-08 DIAGNOSIS — S0993XA Unspecified injury of face, initial encounter: Secondary | ICD-10-CM

## 2019-08-08 DIAGNOSIS — R05 Cough: Secondary | ICD-10-CM | POA: Diagnosis not present

## 2019-08-08 MED ORDER — CHLORHEXIDINE GLUCONATE 0.12 % MT SOLN: 15.0000 mL | Freq: Two times a day (BID) | OROMUCOSAL | 1 refills | Status: DC

## 2019-08-08 MED ORDER — LIDOCAINE VISCOUS HCL 2 % MT SOLN: OROMUCOSAL | 0 refills | Status: DC

## 2019-08-08 NOTE — Progress Notes (Signed)
VIRTUAL VISIT VIA VIDEO  I connected with Daisy Bennett on 08/08/19 at  3:30 PM EST by a video enabled telemedicine application and verified that I am speaking with the correct person using two identifiers. Location patient: Home Location provider: [facility], Office Persons participating in the virtual visit: Patient, Dr. Raoul Pitch and R.Baker, LPN  I discussed the limitations of evaluation and management by telemedicine and the availability of in person appointments. The patient expressed understanding and agreed to proceed.   SUBJECTIVE Chief Complaint  Patient presents with  . Nasal Congestion    x2 days. Pt is retired and keeps grandkids. Pt had fever of 99.7 Sunday only.   . Mouth Lesions    Pt has white sores on tongue that have been there since last week. She is hardly able to eat. The sores have send went away but still has small spot.   . Cough    Productive cough    HPI: Daisy Bennett is a 64 y.o.-year-old female presents today with 2 acute concerns.  Patient reports she has had nasal congestion, sinus pressure and low-grade fever along with a semiproductive cough that started 2 days ago.  She does take care of her grandchildren, however neither of them have recently been sick.  She states she is having mild chest discomfort, but no shortness of breath or wheezing.  She feels similar to when she has had bronchitis in the past.  Currently is not taking anything over-the-counter to help her with her symptoms. She also endorses having an injury to her tongue that occurred last week.  She shows a picture of her tongue from last week and it appears to have been cut and bleeding just right of midline.  She states she had a few sore spots on her tongue and along the side of her tongue as well.  She states she could not see them but she could feel them.  They have improved.  Her tongue is still very sore.  She denies any redness, swelling or drainage from her tongue.  She  reports she has had thrush before and this was not the same feeling or look as when she had thrush.  She feels she has had a much drier mouth over the last 4 weeks.  ROS: See pertinent positives and negatives per HPI.  Patient Active Problem List   Diagnosis Date Noted  . Chronic kidney disease (CKD), stage III (moderate) 07/05/2019  . 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 Olecranon bursitis of left elbow 10/17/2017  . Memory deficit 06/17/2017  . Controlled type 2 diabetes mellitus without complication, without long-term current use of insulin ([place]) 02/14/2017  . Antiplatelet or antithrombotic long-term use 05/18/2016  . Subdural hematoma ([place]) 05/18/2016  . Morbid obesity ([place]) 05/12/2016  . History of colon polyps 12/15/2015  . Tobacco abuse counseling 03/20/2015  . Mixed hyperlipidemia 10/23/2009  . Coronary atherosclerosis 10/23/2009  . CARDIAC MURMUR 10/23/2009  . Hypothyroidism 10/15/2009  . Multiple sclerosis ([place]) 10/15/2009  . Essential hypertension 10/15/2009    Social History   Tobacco Use  . Smoking status: Current Some Day Smoker    Packs/day: 1.00    Last attempt to quit: 12/16/2015    Years since quitting: 3.6  . Smokeless tobacco: Never Used  Substance Use Topics  . Alcohol use: Yes    Alcohol/week: 0.0 standard drinks    Comment: OCCASIONAL ALCOHOL USE.    Current Outpatient Medications:  .  amitriptyline (ELAVIL) 100 MG tablet, Take 1 tablet (100 mg total) by mouth daily., Disp: 90 tablet, Rfl: 1 .  amLODipine (NORVASC) 10 MG tablet, Take 1 tablet (10 mg total) by mouth daily. Please call office for appointment prior to further refills [phone], Disp: 90 tablet, Rfl: 0 .  aspirin EC 81 MG tablet, Take 1 tablet (81 mg total) daily by mouth., Disp: , Rfl:  .  carvedilol (COREG) 6.25 MG tablet, Take 1 tablet (6.25 mg total) by mouth 2 (two) times daily. Please call office for appointment prior to further refills  [phone], Disp: 180 tablet, Rfl: 0 .  cholecalciferol (VITAMIN D) 1000 units tablet, Take 1,000 Units by mouth daily., Disp: , Rfl:  .  Dimethyl Fumarate (TECFIDERA) 120 MG CPDR, Take 120 mg by mouth 2 (two) times daily., Disp: 60 capsule, Rfl: 5 .  esomeprazole (NEXIUM) 20 MG capsule, Take 1 capsule (20 mg total) by mouth daily at 12 noon., Disp: 90 capsule, Rfl: 1 .  fenofibrate 160 MG tablet, TAKE 1 TABLET (160 MG TOTAL) BY MOUTH DAILY. OFFICE VISIT NEEDED, Disp: 90 tablet, Rfl: 3 .  Ferrous Sulfate (IRON) 325 (65 FE) MG TABS, Take 325 mg by mouth daily. , Disp: , Rfl:  .  fish oil-omega-3 fatty acids 1000 MG capsule, Take 1 g by mouth daily. , Disp: , Rfl:  .  gabapentin (NEURONTIN) 600 MG tablet, TAKE 1 TABLET BY MOUTH TWICE A DAY, Disp: 180 tablet, Rfl: 3 .  glimepiride (AMARYL) 2 MG tablet, 2 tab in the morning and  1/2 tab in the evening., Disp: 270 tablet, Rfl: 1 .  hydrochlorothiazide (HYDRODIURIL) 50 MG tablet, Take 1 tablet (50 mg total) by mouth daily., Disp: 90 tablet, Rfl: 1 .  levothyroxine (SYNTHROID) 112 MCG tablet, Take 1 tablet (112 mcg total) by mouth daily., Disp: 90 tablet, Rfl: 0 .  losartan (COZAAR) 100 MG tablet, Take 1 tablet (100 mg total) by mouth daily., Disp: 90 tablet, Rfl: 2 .  metFORMIN (GLUCOPHAGE) 1000 MG tablet, Take 1 tablet (1,000 mg total) by mouth 2 (two) times daily with a meal., Disp: 180 tablet, Rfl: 5 .  nitroGLYCERIN (NITROSTAT) 0.4 MG SL tablet, Place 1 tablet (0.4 mg total) under the tongue every 5 (five) minutes x 3 doses as needed for chest pain., Disp: 25 tablet, Rfl: 4 .  potassium chloride (KLOR-CON) 10 MEQ tablet, Take 1 tablet (10 mEq total) by mouth daily., Disp: 90 tablet, Rfl: 1 .  prasugrel (EFFIENT) 10 MG TABS tablet, TAKE 1 TABLET BY MOUTH EVERY DAY, Disp: 90 tablet, Rfl: 0 .  rosuvastatin (CRESTOR) 10 MG tablet, TAKE 1 TABLET BY MOUTH DAILY. PLEASE MAKE YEARLY APPT WITH DR. Johnsie Cancel FOR OCTOBER FOR FUTURE REFILLS, Disp: 90 tablet, Rfl:  0 .  venlafaxine XR (EFFEXOR XR) 75 MG 24 hr capsule, Take 3 capsules (225 mg total) by mouth daily with breakfast., Disp: 270 capsule, Rfl: 1  Allergies  Allergen Reactions  . Betaseron [Interferon Beta-1b] Other (See Comments)    Increased LFT's  . Provigil [Modafinil] Swelling    Tongue swelling and sores  . Topamax [Topiramate]     Cognitive slowing  . Doxycycline Other (See Comments)    Thrush,dizziness    OBJECTIVE: BP 139/84   Temp 98 F (36.7 C) (Temporal)   Ht 5\' 6"  (1.676 m)   Wt 180 lb (81.6 kg)   BMI 29.05 kg/m  Gen: No acute distress. Nontoxic in appearance.  HENT: AT. [place].  MMM.  No  oral lesions present currently.  Tongue is not red or swollen and appears well-healed. Eyes:Pupils Equal Round Reactive to light, Extraocular movements intact,  Conjunctiva without redness, discharge or icterus. CV: No edema Chest: Cough or shortness of breath not present on exam. Skin: No rashes, purpura or petechiae.  Neuro:  Normal gait. Alert. Oriented x3  Psych: Normal affect, dress and demeanor. Normal speech. Normal thought content and judgment.  ASSESSMENT AND PLAN: ALLISSON ZIMNY is a 64 y.o. female present for  Fever, unspecified fever cause/cough/viral illness/COVID-19 education Rest.  Hydrate.  Mucinex DM.  Over-the-counter supportive treatment discussed. Self-isolation protocol and COVID-19 education provided. COVID-19 testing instructions provided and ordered. Emergent precautions discussed in detail. - Novel Coronavirus, NAA ([facility]) If positive, would recommend patient be considered for infusion therapy giving her comorbidities and age. If negative, and symptoms are not starting to improve would treat for bronchitis with doxycycline.  Injury of tongue, initial encounter Uncertain etiology of the injury to her tongue.  Possibly could have been a blister.  With her other onset of symptoms that followed could be a viral illness such as  hand-foot-and-mouth. Prescribed lidocaine viscus solution for comfort, especially prior to meals. Prescribed Peridex swish twice daily 5-7 days She will monitor for any signs of infection such as swelling, erythema or drainage.  Present immediately if occurs. She will start Biotene mouth rinse OTC after above solutions are completed. Follow-up if any concerns with healing or lesion is present after healing.   Orders Placed This Encounter  Procedures  . Novel Coronavirus, NAA ([facility])   Meds ordered this encounter  Medications  . chlorhexidine (PERIDEX) 0.12 % solution    Sig: Use as directed 15 mLs in the mouth or throat 2 (two) times daily.    Dispense:  120 mL    Refill:  1  . lidocaine (XYLOCAINE) 2 % solution    Sig: 15 ml swish for 30 seconds and spit every 6 hours PRN prior to meals or for mouth pain    Dispense:  200 mL    Refill:  0       Darron Stuck, DO 08/08/2019

## 2019-08-08 NOTE — Patient Instructions (Signed)
COVID-19 COVID-19 is a respiratory infection that is caused by a virus called severe acute respiratory syndrome coronavirus 2 (SARS-CoV-2). The disease is also known as coronavirus disease or novel coronavirus. In some people, the virus may not cause any symptoms. In others, it may cause a serious infection. The infection can get worse quickly and can lead to complications, such as:  Pneumonia, or infection of the lungs.  Acute respiratory distress syndrome or ARDS. This is a condition in which fluid build-up in the lungs prevents the lungs from filling with air and passing oxygen into the blood.  Acute respiratory failure. This is a condition in which there is not enough oxygen passing from the lungs to the body or when carbon dioxide is not passing from the lungs out of the body.  Sepsis or septic shock. This is a serious bodily reaction to an infection.  Blood clotting problems.  Secondary infections due to bacteria or fungus.  Organ failure. This is when your body's organs stop working. The virus that causes COVID-19 is contagious. This means that it can spread from person to person through droplets from coughs and sneezes (respiratory secretions). What are the causes? This illness is caused by a virus. You may catch the virus by:  Breathing in droplets from an infected person. Droplets can be spread by a person breathing, speaking, singing, coughing, or sneezing.  Touching something, like a table or a doorknob, that was exposed to the virus (contaminated) and then touching your mouth, nose, or eyes. What increases the risk? Risk for infection You are more likely to be infected with this virus if you:  Are within 6 feet (2 meters) of a person with COVID-19.  Provide care for or live with a person who is infected with COVID-19.  Spend time in crowded indoor spaces or live in shared housing. Risk for serious illness You are more likely to become seriously ill from the virus if you:   Are 50 years of age or older. The higher your age, the more you are at risk for serious illness.  Live in a nursing home or long-term care facility.  Have cancer.  Have a long-term (chronic) disease such as: ? Chronic lung disease, including chronic obstructive pulmonary disease or asthma. ? A long-term disease that lowers your body's ability to fight infection (immunocompromised). ? Heart disease, including heart failure, a condition in which the arteries that lead to the heart become narrow or blocked (coronary artery disease), a disease which makes the heart muscle thick, weak, or stiff (cardiomyopathy). ? Diabetes. ? Chronic kidney disease. ? Sickle cell disease, a condition in which red blood cells have an abnormal "sickle" shape. ? Liver disease.  Are obese. What are the signs or symptoms? Symptoms of this condition can range from mild to severe. Symptoms may appear any time from 2 to 14 days after being exposed to the virus. They include:  A fever or chills.  A cough.  Difficulty breathing.  Headaches, body aches, or muscle aches.  Runny or stuffy (congested) nose.  A sore throat.  New loss of taste or smell. Some people may also have stomach problems, such as nausea, vomiting, or diarrhea. Other people may not have any symptoms of COVID-19. How is this diagnosed? This condition may be diagnosed based on:  Your signs and symptoms, especially if: ? You live in an area with a COVID-19 outbreak. ? You recently traveled to or from an area where the virus is common. ? You   provide care for or live with a person who was diagnosed with COVID-19. ? You were exposed to a person who was diagnosed with COVID-19.  A physical exam.  Lab tests, which may include: ? Taking a sample of fluid from the back of your nose and throat (nasopharyngeal fluid), your nose, or your throat using a swab. ? A sample of mucus from your lungs (sputum). ? Blood tests.  Imaging tests, which  may include, X-rays, CT scan, or ultrasound. How is this treated? At present, there is no medicine to treat COVID-19. Medicines that treat other diseases are being used on a trial basis to see if they are effective against COVID-19. Your health care provider will talk with you about ways to treat your symptoms. For most people, the infection is mild and can be managed at home with rest, fluids, and over-the-counter medicines. Treatment for a serious infection usually takes places in a hospital intensive care unit (ICU). It may include one or more of the following treatments. These treatments are given until your symptoms improve.  Receiving fluids and medicines through an IV.  Supplemental oxygen. Extra oxygen is given through a tube in the nose, a face mask, or a hood.  Positioning you to lie on your stomach (prone position). This makes it easier for oxygen to get into the lungs.  Continuous positive airway pressure (CPAP) or bi-level positive airway pressure (BPAP) machine. This treatment uses mild air pressure to keep the airways open. A tube that is connected to a motor delivers oxygen to the body.  Ventilator. This treatment moves air into and out of the lungs by using a tube that is placed in your windpipe.  Tracheostomy. This is a procedure to create a hole in the neck so that a breathing tube can be inserted.  Extracorporeal membrane oxygenation (ECMO). This procedure gives the lungs a chance to recover by taking over the functions of the heart and lungs. It supplies oxygen to the body and removes carbon dioxide. Follow these instructions at home: Lifestyle  If you are sick, stay home except to get medical care. Your health care provider will tell you how long to stay home. Call your health care provider before you go for medical care.  Rest at home as told by your health care provider.  Do not use any products that contain nicotine or tobacco, such as cigarettes, e-cigarettes, and  chewing tobacco. If you need help quitting, ask your health care provider.  Return to your normal activities as told by your health care provider. Ask your health care provider what activities are safe for you. General instructions  Take over-the-counter and prescription medicines only as told by your health care provider.  Drink enough fluid to keep your urine pale yellow.  Keep all follow-up visits as told by your health care provider. This is important. How is this prevented?  There is no vaccine to help prevent COVID-19 infection. However, there are steps you can take to protect yourself and others from this virus. To protect yourself:   Do not travel to areas where COVID-19 is a risk. The areas where COVID-19 is reported change often. To identify high-risk areas and travel restrictions, check the CDC travel website: wwwnc.cdc.gov/travel/notices  If you live in, or must travel to, an area where COVID-19 is a risk, take precautions to avoid infection. ? Stay away from people who are sick. ? Wash your hands often with soap and water for 20 seconds. If soap and water   are not available, use an alcohol-based hand sanitizer. ? Avoid touching your mouth, face, eyes, or nose. ? Avoid going out in public, follow guidance from your state and local health authorities. ? If you must go out in public, wear a cloth face covering or face mask. Make sure your mask covers your nose and mouth. ? Avoid crowded indoor spaces. Stay at least 6 feet (2 meters) away from others. ? Disinfect objects and surfaces that are frequently touched every day. This may include:  Counters and tables.  Doorknobs and light switches.  Sinks and faucets.  Electronics, such as phones, remote controls, keyboards, computers, and tablets. To protect others: If you have symptoms of COVID-19, take steps to prevent the virus from spreading to others.  If you think you have a COVID-19 infection, contact your health care  provider right away. Tell your health care team that you think you may have a COVID-19 infection.  Stay home. Leave your house only to seek medical care. Do not use public transport.  Do not travel while you are sick.  Wash your hands often with soap and water for 20 seconds. If soap and water are not available, use alcohol-based hand sanitizer.  Stay away from other members of your household. Let healthy household members care for children and pets, if possible. If you have to care for children or pets, wash your hands often and wear a mask. If possible, stay in your own room, separate from others. Use a different bathroom.  Make sure that all people in your household wash their hands well and often.  Cough or sneeze into a tissue or your sleeve or elbow. Do not cough or sneeze into your hand or into the air.  Wear a cloth face covering or face mask. Make sure your mask covers your nose and mouth. Where to find more information  Centers for Disease Control and Prevention: www.cdc.gov/coronavirus/2019-ncov/index.html  World Health Organization: www.who.int/health-topics/coronavirus Contact a health care provider if:  You live in or have traveled to an area where COVID-19 is a risk and you have symptoms of the infection.  You have had contact with someone who has COVID-19 and you have symptoms of the infection. Get help right away if:  You have trouble breathing.  You have pain or pressure in your chest.  You have confusion.  You have bluish lips and fingernails.  You have difficulty waking from sleep.  You have symptoms that get worse. These symptoms may represent a serious problem that is an emergency. Do not wait to see if the symptoms will go away. Get medical help right away. Call your local emergency services (911 in the [place] Do not drive yourself to the hospital. Let the emergency medical personnel know if you think you have COVID-19. Summary  COVID-19 is a  respiratory infection that is caused by a virus. It is also known as coronavirus disease or novel coronavirus. It can cause serious infections, such as pneumonia, acute respiratory distress syndrome, acute respiratory failure, or sepsis.  The virus that causes COVID-19 is contagious. This means that it can spread from person to person through droplets from breathing, speaking, singing, coughing, or sneezing.  You are more likely to develop a serious illness if you are 50 years of age or older, have a weak immune system, live in a nursing home, or have chronic disease.  There is no medicine to treat COVID-19. Your health care provider will talk with you about ways to treat your symptoms.    Take steps to protect yourself and others from infection. Wash your hands often and disinfect objects and surfaces that are frequently touched every day. Stay away from people who are sick and wear a mask if you are sick. This information is not intended to replace advice given to you by your health care provider. Make sure you discuss any questions you have with your health care provider. Document Revised: 05/18/2019 Document Reviewed: 08/24/2018 Elsevier Patient Education  2020 [employer] Inc.  

## 2019-08-09 ENCOUNTER — Ambulatory Visit: Payer: PRIVATE HEALTH INSURANCE | Attending: Internal Medicine

## 2019-08-09 DIAGNOSIS — Z20822 Contact with and (suspected) exposure to covid-19: Secondary | ICD-10-CM

## 2019-08-11 ENCOUNTER — Telehealth: Payer: Self-pay | Admitting: Unknown Physician Specialty

## 2019-08-11 ENCOUNTER — Other Ambulatory Visit: Payer: Self-pay | Admitting: Unknown Physician Specialty

## 2019-08-11 ENCOUNTER — Telehealth: Payer: Self-pay | Admitting: Adult Health

## 2019-08-11 DIAGNOSIS — U071 COVID-19: Secondary | ICD-10-CM

## 2019-08-11 LAB — NOVEL CORONAVIRUS, NAA: SARS-CoV-2, NAA: DETECTED — AB

## 2019-08-11 NOTE — Telephone Encounter (Signed)
  I connected by phone with Daisy Bennett on 08/11/2019 at 4:07 PM to discuss the potential use of an new treatment for mild to moderate COVID-19 viral infection in non-hospitalized patients.  This patient is a 64 y.o. female that meets the FDA criteria for Emergency Use Authorization of bamlanivimab or casirivimab\imdevimab.  Has a (+) direct SARS-CoV-2 viral test result  Has mild or moderate COVID-19   Is ? 64 years of age and weighs ? 40 kg  Is NOT hospitalized due to COVID-19  Is NOT requiring oxygen therapy or requiring an increase in baseline oxygen flow rate due to COVID-19  Is within 10 days of symptom onset  Has at least one of the high risk factor(s) for progression to severe COVID-19 and/or hospitalization as defined in EUA.  Specific high risk criteria : >/= 66 yo with multiple co morbid conditions    I have spoken and communicated the following to the patient or parent/caregiver:  1. FDA has authorized the emergency use of bamlanivimab and casirivimab\imdevimab for the treatment of mild to moderate COVID-19 in adults and pediatric patients with positive results of direct SARS-CoV-2 viral testing who are 17 years of age and older weighing at least 40 kg, and who are at high risk for progressing to severe COVID-19 and/or hospitalization.  2. The significant known and potential risks and benefits of bamlanivimab and casirivimab\imdevimab, and the extent to which such potential risks and benefits are unknown.  3. Information on available alternative treatments and the risks and benefits of those alternatives, including clinical trials.  4. Patients treated with bamlanivimab and casirivimab\imdevimab should continue to self-isolate and use infection control measures (e.g., wear mask, isolate, social distance, avoid sharing personal items, clean and disinfect "high touch" surfaces, and frequent handwashing) according to CDC guidelines.   5. The patient or parent/caregiver has  the option to accept or refuse bamlanivimab or casirivimab\imdevimab .  After reviewing this information with the patient, The patient agreed to proceed with receiving the bamlanimivab infusion and will be provided a copy of the Fact sheet prior to receiving the infusion.Daisy Bennett 08/11/2019 4:07 PM  Sx onset 08/06/19

## 2019-08-11 NOTE — Telephone Encounter (Signed)
Patient called about Positive Covid test.  2 patient identifiers confirmed.  Date Tested: 08/09/2019  Date of Symptom onset: 08/06/2019   Symptoms: Mild   High Risk for complications: 65 years or older with underlying chronic health conditions such as COPD, CHF, HTN diabetes, etc.  She has appointment for monoclonal antibody therapy next week.    Isolation Recommendations:  Patient understands the needs to stay in isolation for a total of 10 days from onset of symptom or 14 days total from date of testing if no symptom. Reviewed Masking.    Supportive Care Recommendations: Encouraged plenty of fluid intake, Tylenol per package directions, and to remain as active as possible.    Patient knows the health department may be in touch.    I answered all of patient's questions and all concerns addressed.  Gave information on My chart.    Time Spent: 7 minutes  Wilber Bihari, NP

## 2019-08-13 ENCOUNTER — Telehealth: Payer: Self-pay

## 2019-08-13 NOTE — Telephone Encounter (Signed)
Patient is going for her infusion 08/14/19.

## 2019-08-14 ENCOUNTER — Encounter (HOSPITAL_COMMUNITY): Payer: Self-pay

## 2019-08-14 ENCOUNTER — Ambulatory Visit (HOSPITAL_COMMUNITY)
Admission: RE | Admit: 2019-08-14 | Discharge: 2019-08-14 | Disposition: A | Discharge status: Home / Self Care | Payer: Medicare Other | Source: Ambulatory Visit | Attending: Pulmonary Disease | Admitting: Pulmonary Disease

## 2019-08-14 DIAGNOSIS — U071 COVID-19: Secondary | ICD-10-CM | POA: Insufficient documentation

## 2019-08-14 DIAGNOSIS — Z23 Encounter for immunization: Secondary | ICD-10-CM | POA: Diagnosis not present

## 2019-08-14 MED ORDER — METHYLPREDNISOLONE SODIUM SUCC 125 MG IJ SOLR: 125.0000 mg | Freq: Once | INTRAMUSCULAR | Status: DC | PRN

## 2019-08-14 MED ORDER — EPINEPHRINE 0.3 MG/0.3ML IJ SOAJ: 0.3000 mg | Freq: Once | INTRAMUSCULAR | Status: DC | PRN

## 2019-08-14 MED ORDER — DIPHENHYDRAMINE HCL 50 MG/ML IJ SOLN: 50.0000 mg | Freq: Once | INTRAMUSCULAR | Status: DC | PRN

## 2019-08-14 MED ORDER — SODIUM CHLORIDE 0.9 % IV SOLN
700.0000 mg | Freq: Once | INTRAVENOUS | Status: AC
  Administered 2019-08-14: 700 mg via INTRAVENOUS
  Filled 2019-08-14: qty 20

## 2019-08-14 MED ORDER — FAMOTIDINE IN NACL 20-0.9 MG/50ML-% IV SOLN: 20.0000 mg | Freq: Once | INTRAVENOUS | Status: DC | PRN

## 2019-08-14 MED ORDER — ALBUTEROL SULFATE HFA 108 (90 BASE) MCG/ACT IN AERS: 2.0000 | INHALATION_SPRAY | Freq: Once | RESPIRATORY_TRACT | Status: DC | PRN

## 2019-08-14 MED ORDER — SODIUM CHLORIDE 0.9 % IV SOLN
INTRAVENOUS | Status: DC | PRN
  Administered 2019-08-14: 250 mL via INTRAVENOUS

## 2019-08-14 NOTE — Progress Notes (Signed)
  Diagnosis: COVID-19  Physician: Dr. Joya Gaskins  Procedure: [facility] Med: bamlanivimab infusion - Provided patient with bamlanimivab fact sheet for patients, parents and caregivers prior to infusion.  Complications: No immediate complications noted.  Discharge: Discharged home   Babs Sciara 08/14/2019

## 2019-08-14 NOTE — Telephone Encounter (Signed)
Noted! Thank you

## 2019-08-14 NOTE — Telephone Encounter (Signed)
FYI

## 2019-08-14 NOTE — Discharge Instructions (Signed)

## 2019-08-20 ENCOUNTER — Ambulatory Visit: Payer: 59 | Admitting: Neurology

## 2019-08-22 ENCOUNTER — Ambulatory Visit (INDEPENDENT_AMBULATORY_CARE_PROVIDER_SITE_OTHER): Payer: Medicare Other | Admitting: Family Medicine

## 2019-08-22 ENCOUNTER — Encounter: Payer: Self-pay | Admitting: Family Medicine

## 2019-08-22 ENCOUNTER — Other Ambulatory Visit: Payer: Self-pay

## 2019-08-22 VITALS — BP 138/81 | HR 69 | Temp 97.5°F | Resp 16 | Ht 66.0 in | Wt 190.4 lb

## 2019-08-22 DIAGNOSIS — Z8616 Personal history of COVID-19: Secondary | ICD-10-CM

## 2019-08-22 DIAGNOSIS — U071 COVID-19: Secondary | ICD-10-CM

## 2019-08-22 DIAGNOSIS — K1230 Oral mucositis (ulcerative), unspecified: Secondary | ICD-10-CM | POA: Diagnosis not present

## 2019-08-22 MED ORDER — NYSTATIN 100000 UNIT/ML MT SUSP: 5.0000 mL | Freq: Four times a day (QID) | OROMUCOSAL | 0 refills | Status: DC

## 2019-08-22 NOTE — Progress Notes (Signed)
Daisy Bennett     SUBJECTIVE Chief Complaint  Patient presents with  . Mouth Lesions    Pt feels like tongue has been burnt on the end and bump on left side. Bleeds at times. All patient tastes are foods/drinks that are hot     HPI: Daisy Bennett is a 64 y.o.-year-old female  Patient presents today to follow-up on her oral symptoms felt to be simply related to her viral illness, which turned out to be Covid.  Patient reports from her respiratory infection standpoint she is much improved.  She did receive a Covid infusion treatment.  Her tongue lesions have healed greatly with the use of Peridex rinse.  She has been using the lidocaine swish prior to eating and drinking.  She states she still has burning of her tongue and mouth when eating anything with a little spiciness to it.  She is able to eat and drink better now.  She still has a few locations on her tongue that are burning. Prior note:  presents today with 2 acute concerns.  Patient reports she has had nasal congestion, sinus pressure and low-grade fever along with a semiproductive cough that started 2 days ago.  She does take care of her grandchildren, however neither of them have recently been sick.  She states she is having mild chest discomfort, but no shortness of breath or wheezing.  She feels similar to when she has had bronchitis in the past.  Currently is not taking anything over-the-counter to help her with her symptoms. She also endorses having an injury to her tongue that occurred last week.  She shows a picture of her tongue from last week and it appears to have been cut and bleeding just right of midline.  She states she had a few sore spots on her tongue and along the side of her tongue as well.  She states she could not see them but she could feel them.  They have improved.  Her tongue is still very sore.  She denies any redness, swelling or drainage from her tongue.  She reports she has had thrush before and this was not the same  feeling or look as when she had thrush.  She feels she has had a much drier mouth over the last 4 weeks.  ROS: See pertinent positives and negatives per HPI.  Patient Active Problem List   Diagnosis Date Noted  . Chronic kidney disease (CKD), stage III (moderate) 07/05/2019  . 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 Olecranon bursitis of left elbow 10/17/2017  . Memory deficit 06/17/2017  . Controlled type 2 diabetes mellitus without complication, without long-term current use of insulin ([place]) 02/14/2017  . Antiplatelet or antithrombotic long-term use 05/18/2016  . Subdural hematoma ([place]) 05/18/2016  . Morbid obesity ([place]) 05/12/2016  . History of colon polyps 12/15/2015  . Tobacco abuse counseling 03/20/2015  . Mixed hyperlipidemia 10/23/2009  . Coronary atherosclerosis 10/23/2009  . CARDIAC MURMUR 10/23/2009  . Hypothyroidism 10/15/2009  . Multiple sclerosis ([place]) 10/15/2009  . Essential hypertension 10/15/2009    Social History   Tobacco Use  . Smoking status: Current Some Day Smoker    Packs/day: 1.00    Last attempt to quit: 12/16/2015    Years since quitting: 3.6  . Smokeless tobacco: Never Used  Substance Use Topics  . Alcohol use: Yes    Alcohol/week: 0.0 standard drinks    Comment: OCCASIONAL ALCOHOL USE.    Current Outpatient Medications:  .  amitriptyline (ELAVIL) 100 MG tablet, Take 1 tablet (100 mg total) by mouth daily., Disp: 90 tablet, Rfl: 1 .  amLODipine (NORVASC) 10 MG tablet, Take 1 tablet (10 mg total) by mouth daily. Please call office for appointment prior to further refills [phone], Disp: 90 tablet, Rfl: 0 .  aspirin EC 81 MG tablet, Take 1 tablet (81 mg total) daily by mouth., Disp: , Rfl:  .  carvedilol (COREG) 6.25 MG tablet, Take 1 tablet (6.25 mg total) by mouth 2 (two) times daily. Please call office for appointment prior to further refills [phone], Disp: 180 tablet, Rfl: 0 .  chlorhexidine (PERIDEX)  0.12 % solution, Use as directed 15 mLs in the mouth or throat 2 (two) times daily., Disp: 120 mL, Rfl: 1 .  cholecalciferol (VITAMIN D) 1000 units tablet, Take 1,000 Units by mouth daily., Disp: , Rfl:  .  Dimethyl Fumarate (TECFIDERA) 120 MG CPDR, Take 120 mg by mouth 2 (two) times daily., Disp: 60 capsule, Rfl: 5 .  esomeprazole (NEXIUM) 20 MG capsule, Take 1 capsule (20 mg total) by mouth daily at 12 noon., Disp: 90 capsule, Rfl: 1 .  fenofibrate 160 MG tablet, TAKE 1 TABLET (160 MG TOTAL) BY MOUTH DAILY. OFFICE VISIT NEEDED, Disp: 90 tablet, Rfl: 3 .  Ferrous Sulfate (IRON) 325 (65 FE) MG TABS, Take 325 mg by mouth daily. , Disp: , Rfl:  .  fish oil-omega-3 fatty acids 1000 MG capsule, Take 1 g by mouth daily. , Disp: , Rfl:  .  gabapentin (NEURONTIN) 600 MG tablet, TAKE 1 TABLET BY MOUTH TWICE A DAY, Disp: 180 tablet, Rfl: 3 .  glimepiride (AMARYL) 2 MG tablet, 2 tab in the morning and  1/2 tab in the evening., Disp: 270 tablet, Rfl: 1 .  hydrochlorothiazide (HYDRODIURIL) 50 MG tablet, Take 1 tablet (50 mg total) by mouth daily., Disp: 90 tablet, Rfl: 1 .  levothyroxine (SYNTHROID) 112 MCG tablet, Take 1 tablet (112 mcg total) by mouth daily., Disp: 90 tablet, Rfl: 0 .  lidocaine (XYLOCAINE) 2 % solution, 15 ml swish for 30 seconds and spit every 6 hours PRN prior to meals or for mouth pain, Disp: 200 mL, Rfl: 0 .  losartan (COZAAR) 100 MG tablet, Take 1 tablet (100 mg total) by mouth daily., Disp: 90 tablet, Rfl: 2 .  metFORMIN (GLUCOPHAGE) 1000 MG tablet, Take 1 tablet (1,000 mg total) by mouth 2 (two) times daily with a meal., Disp: 180 tablet, Rfl: 5 .  nitroGLYCERIN (NITROSTAT) 0.4 MG SL tablet, Place 1 tablet (0.4 mg total) under the tongue every 5 (five) minutes x 3 doses as needed for chest pain., Disp: 25 tablet, Rfl: 4 .  potassium chloride (KLOR-CON) 10 MEQ tablet, Take 1 tablet (10 mEq total) by mouth daily., Disp: 90 tablet, Rfl: 1 .  prasugrel (EFFIENT) 10 MG TABS tablet, TAKE 1  TABLET BY MOUTH EVERY DAY, Disp: 90 tablet, Rfl: 0 .  rosuvastatin (CRESTOR) 10 MG tablet, TAKE 1 TABLET BY MOUTH DAILY. PLEASE MAKE YEARLY APPT WITH DR. Johnsie Cancel FOR OCTOBER FOR FUTURE REFILLS, Disp: 90 tablet, Rfl: 0 .  venlafaxine XR (EFFEXOR XR) 75 MG 24 hr capsule, Take 3 capsules (225 mg total) by mouth daily with breakfast., Disp: 270 capsule, Rfl: 1 .  nystatin (MYCOSTATIN) 100000 UNIT/ML suspension, Take 5 mLs (500,000 Units total) by mouth 4 (four) times daily. 5 ml swish for 30 seconds and then spit out, QID, do not swallow., Disp: 100 mL, Rfl: 0  Allergies  Allergen  Reactions  . Betaseron [Interferon Beta-1b] Other (See Comments)    Increased LFT's  . Provigil [Modafinil] Swelling    Tongue swelling and sores  . Topamax [Topiramate]     Cognitive slowing  . Doxycycline Other (See Comments)    Thrush,dizziness    OBJECTIVE: BP 138/81 (BP Location: Right Arm, Patient Position: Sitting, Cuff Size: Normal)   Pulse 69   Temp (!) 97.5 F (36.4 C) (Temporal)   Resp 16   Ht 5\' 6"  (1.676 m)   Wt 190 lb 6 oz (86.4 kg)   SpO2 100%   BMI 30.73 kg/m  Gen: Afebrile. No acute distress.  HENT: AT. [place].  Throat without erythema or exudates.  Tongue fissure right of midline healed.  No erythema or drainage from this location.  Fissure appearance also present right posterior tongue.  Tongue appears dry with multiple small bumpy lesions.  White plaque-like material over medial tongue and right lateral tongue.  Geographical appearance of tongue.  X4 small petechiae/healing ulcerations located bilateral mouth mucosa. Eyes:Pupils Equal Round Reactive to light, Extraocular movements intact,  Conjunctiva without redness, discharge or icterus. Neck/lymp/endocrine: Supple, no lymphadenopathy Neuro: Alert. Oriented x3  Psych: Normal affect, dress and demeanor. Normal speech. Normal thought content and judgment.  ASSESSMENT AND PLAN: Daisy Bennett is a 64 y.o. female present for  COVID-19/Oral  mucositis -Oral mucositis suspected to be from COVID-19 infection.  She is seeing improvement with use of the Peridex solution.  The deep bleeding fissure first noted has completely healed nicely. She will start Biotene mouth rinse OTC . To be safe we will also start nystatin swish 4 times daily x5 days.  Can discontinue the Peridex. Avoid spicy foods until completely resolved. Discussed with her if this is "Covid tongue "she should continue to see improvement, but may take 1 to 2 months to completely resolve. If the condition worsens, follow-up sooner.   No orders of the defined types were placed in this encounter.  Meds ordered this encounter  Medications  . nystatin (MYCOSTATIN) 100000 UNIT/ML suspension    Sig: Take 5 mLs (500,000 Units total) by mouth 4 (four) times daily. 5 ml swish for 30 seconds and then spit out, QID, do not swallow.    Dispense:  100 mL    Refill:  [place], DO 08/22/2019

## 2019-08-22 NOTE — Patient Instructions (Signed)
I am wondering if this is not from the covid infection.   Start nystatin swish to be safe and the biotene. You no longer need the other rinse. The area is well healing.    Contact your pharmacy about the change in your losartan pill also.

## 2019-08-24 ENCOUNTER — Encounter: Payer: Self-pay | Admitting: Family Medicine

## 2019-08-24 ENCOUNTER — Other Ambulatory Visit: Payer: Self-pay | Admitting: Family Medicine

## 2019-08-24 MED ORDER — FLUCONAZOLE 100 MG PO TABS: 100.0000 mg | ORAL_TABLET | Freq: Every day | ORAL | 0 refills | Status: AC

## 2019-08-24 NOTE — Telephone Encounter (Signed)
Please advise on pt's my chart message

## 2019-08-31 ENCOUNTER — Ambulatory Visit (INDEPENDENT_AMBULATORY_CARE_PROVIDER_SITE_OTHER): Payer: Medicare Other | Admitting: Family Medicine

## 2019-08-31 ENCOUNTER — Other Ambulatory Visit: Payer: Self-pay

## 2019-08-31 ENCOUNTER — Encounter: Payer: Self-pay | Admitting: Family Medicine

## 2019-08-31 VITALS — BP 132/74 | HR 67 | Temp 97.0°F | Resp 16 | Ht 66.0 in | Wt 191.5 lb

## 2019-08-31 DIAGNOSIS — K1230 Oral mucositis (ulcerative), unspecified: Secondary | ICD-10-CM

## 2019-08-31 DIAGNOSIS — K141 Geographic tongue: Secondary | ICD-10-CM | POA: Diagnosis not present

## 2019-08-31 HISTORY — DX: Geographic tongue: K14.1

## 2019-08-31 HISTORY — DX: Oral mucositis (ulcerative), unspecified: K12.30

## 2019-08-31 MED ORDER — NYSTATIN 100000 UNIT/ML MT SUSP: 5.0000 mL | Freq: Four times a day (QID) | OROMUCOSAL | 0 refills | Status: DC

## 2019-08-31 NOTE — Patient Instructions (Signed)
Called in more of the nystatin swish for you. Continue Peridex twice a day for 7 more days.  Finish the diflucan pills.   I will place referral to ENT- if you symptoms resolve you can cancel it.

## 2019-08-31 NOTE — Progress Notes (Signed)
Daisy Bennett     SUBJECTIVE Chief Complaint  Patient presents with  . Follow-up    Pt still has some bumps on her tongue but she states it is better. She still cannot have anything with pepper without it burning.     HPI: Daisy Bennett is a 64 y.o.-year-old female to follow-up on her mucositis/glottitis/geographic tongue secondary to COVID-19/viral illness.  Onset was July 25, 2019.  Patient has now been prescribed Peridex solution, nystatin swish, peroxide swish daily and Diflucan daily.  She has been using the Biotene intermittently.  Today she reports her tongue is finally much improved.  She still has some burning sensation with acidic or peppery foods.  She feels the bumps on her tongue have almost completely resolved.  She still has a little bit of a sore throat remaining.  She denies any fevers.  She has made an appointment with her dentist to have her gums and teeth checked for any Covid 19 adverse effects. Prior note Patient presents today to follow-up on her oral symptoms felt to be simply related to her viral illness, which turned out to be Covid.  Patient reports from her respiratory infection standpoint she is much improved.  She did receive a Covid infusion treatment.  Her tongue lesions have healed greatly with the use of Peridex rinse.  She has been using the lidocaine swish prior to eating and drinking.  She states she still has burning of her tongue and mouth when eating anything with a little spiciness to it.  She is able to eat and drink better now.  She still has a few locations on her tongue that are burning. Prior note:  presents today with 2 acute concerns.  Patient reports she has had nasal congestion, sinus pressure and low-grade fever along with a semiproductive cough that started 2 days ago.  She does take care of her grandchildren, however neither of them have recently been sick.  She states she is having mild chest discomfort, but no shortness of breath or wheezing.  She  feels similar to when she has had bronchitis in the past.  Currently is not taking anything over-the-counter to help her with her symptoms. She also endorses having an injury to her tongue that occurred last week.  She shows a picture of her tongue from last week and it appears to have been cut and bleeding just right of midline.  She states she had a few sore spots on her tongue and along the side of her tongue as well.  She states she could not see them but she could feel them.  They have improved.  Her tongue is still very sore.  She denies any redness, swelling or drainage from her tongue.  She reports she has had thrush before and this was not the same feeling or look as when she had thrush.  She feels she has had a much drier mouth over the last 4 weeks.  ROS: See pertinent positives and negatives per HPI.  Patient Active Problem List   Diagnosis Date Noted  . 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 Oral mucositis 08/31/2019  . Chronic kidney disease (CKD), stage III (moderate) 07/05/2019  . 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 Olecranon bursitis of left elbow 10/17/2017  . Memory deficit 06/17/2017  . Controlled type 2 diabetes mellitus without complication, without long-term current use of insulin ([place]) 02/14/2017  . Antiplatelet or antithrombotic long-term use 05/18/2016  . Subdural hematoma ([place]) 05/18/2016  .  Morbid obesity ([place]) 05/12/2016  . History of colon polyps 12/15/2015  . Tobacco abuse counseling 03/20/2015  . Mixed hyperlipidemia 10/23/2009  . Coronary atherosclerosis 10/23/2009  . CARDIAC MURMUR 10/23/2009  . Hypothyroidism 10/15/2009  . Multiple sclerosis ([place]) 10/15/2009  . Essential hypertension 10/15/2009    Social History   Tobacco Use  . Smoking status: Former Smoker    Packs/day: 1.00    Quit date: 12/16/2015    Years since quitting: 3.7  . Smokeless tobacco: Never Used  Substance Use Topics  . Alcohol use: Yes     Alcohol/week: 0.0 standard drinks    Comment: OCCASIONAL ALCOHOL USE.    Current Outpatient Medications:  .  amitriptyline (ELAVIL) 100 MG tablet, Take 1 tablet (100 mg total) by mouth daily., Disp: 90 tablet, Rfl: 1 .  amLODipine (NORVASC) 10 MG tablet, Take 1 tablet (10 mg total) by mouth daily. Please call office for appointment prior to further refills [phone], Disp: 90 tablet, Rfl: 0 .  aspirin EC 81 MG tablet, Take 1 tablet (81 mg total) daily by mouth., Disp: , Rfl:  .  carvedilol (COREG) 6.25 MG tablet, Take 1 tablet (6.25 mg total) by mouth 2 (two) times daily. Please call office for appointment prior to further refills [phone], Disp: 180 tablet, Rfl: 0 .  chlorhexidine (PERIDEX) 0.12 % solution, Use as directed 15 mLs in the mouth or throat 2 (two) times daily., Disp: 120 mL, Rfl: 1 .  cholecalciferol (VITAMIN D) 1000 units tablet, Take 1,000 Units by mouth daily., Disp: , Rfl:  .  Dimethyl Fumarate (TECFIDERA) 120 MG CPDR, Take 120 mg by mouth 2 (two) times daily., Disp: 60 capsule, Rfl: 5 .  esomeprazole (NEXIUM) 20 MG capsule, Take 1 capsule (20 mg total) by mouth daily at 12 noon., Disp: 90 capsule, Rfl: 1 .  fenofibrate 160 MG tablet, TAKE 1 TABLET (160 MG TOTAL) BY MOUTH DAILY. OFFICE VISIT NEEDED, Disp: 90 tablet, Rfl: 3 .  Ferrous Sulfate (IRON) 325 (65 FE) MG TABS, Take 325 mg by mouth daily. , Disp: , Rfl:  .  fish oil-omega-3 fatty acids 1000 MG capsule, Take 1 g by mouth daily. , Disp: , Rfl:  .  fluconazole (DIFLUCAN) 100 MG tablet, Take 1 tablet (100 mg total) by mouth daily for 10 days., Disp: 10 tablet, Rfl: 0 .  gabapentin (NEURONTIN) 600 MG tablet, TAKE 1 TABLET BY MOUTH TWICE A DAY, Disp: 180 tablet, Rfl: 3 .  glimepiride (AMARYL) 2 MG tablet, 2 tab in the morning and  1/2 tab in the evening., Disp: 270 tablet, Rfl: 1 .  hydrochlorothiazide (HYDRODIURIL) 50 MG tablet, Take 1 tablet (50 mg total) by mouth daily., Disp: 90 tablet, Rfl: 1 .  levothyroxine  (SYNTHROID) 112 MCG tablet, Take 1 tablet (112 mcg total) by mouth daily., Disp: 90 tablet, Rfl: 0 .  lidocaine (XYLOCAINE) 2 % solution, 15 ml swish for 30 seconds and spit every 6 hours PRN prior to meals or for mouth pain, Disp: 200 mL, Rfl: 0 .  losartan (COZAAR) 100 MG tablet, Take 1 tablet (100 mg total) by mouth daily., Disp: 90 tablet, Rfl: 2 .  metFORMIN (GLUCOPHAGE) 1000 MG tablet, Take 1 tablet (1,000 mg total) by mouth 2 (two) times daily with a meal., Disp: 180 tablet, Rfl: 5 .  nitroGLYCERIN (NITROSTAT) 0.4 MG SL tablet, Place 1 tablet (0.4 mg total) under the tongue every 5 (five) minutes x 3 doses as needed for chest pain., Disp: 25 tablet, Rfl:  4 .  nystatin (MYCOSTATIN) 100000 UNIT/ML suspension, Take 5 mLs (500,000 Units total) by mouth 4 (four) times daily. 5 ml swish for 30 seconds and then spit out, QID, do not swallow., Disp: 100 mL, Rfl: 0 .  potassium chloride (KLOR-CON) 10 MEQ tablet, Take 1 tablet (10 mEq total) by mouth daily., Disp: 90 tablet, Rfl: 1 .  prasugrel (EFFIENT) 10 MG TABS tablet, TAKE 1 TABLET BY MOUTH EVERY DAY, Disp: 90 tablet, Rfl: 0 .  rosuvastatin (CRESTOR) 10 MG tablet, TAKE 1 TABLET BY MOUTH DAILY. PLEASE MAKE YEARLY APPT WITH DR. Johnsie Cancel FOR OCTOBER FOR FUTURE REFILLS, Disp: 90 tablet, Rfl: 0 .  venlafaxine XR (EFFEXOR XR) 75 MG 24 hr capsule, Take 3 capsules (225 mg total) by mouth daily with breakfast., Disp: 270 capsule, Rfl: 1 .  nystatin (MYCOSTATIN) 100000 UNIT/ML suspension, Take 5 mLs (500,000 Units total) by mouth 4 (four) times daily., Disp: 100 mL, Rfl: 0  Allergies  Allergen Reactions  . Betaseron [Interferon Beta-1b] Other (See Comments)    Increased LFT's  . Provigil [Modafinil] Swelling    Tongue swelling and sores  . Topamax [Topiramate]     Cognitive slowing  . Doxycycline Other (See Comments)    Thrush,dizziness    OBJECTIVE: BP 132/74 (BP Location: Left Arm, Patient Position: Sitting, Cuff Size: Normal)   Pulse 67   Temp  (!) 97 F (36.1 C) (Temporal)   Resp 16   Ht 5\' 6"  (1.676 m)   Wt 191 lb 8 oz (86.9 kg)   SpO2 100%   BMI 30.91 kg/m  Gen: Afebrile. No acute distress.  HENT: AT. [place]. MMM.  Throat without erythema or exudates.  fissures of tongue have completely healed.  Bumpy lesions have resolved.  Geographical appearance of tongue is 90% resolved.  X1 remaining petechiae right posterior buccal mucosa.  White plaque-like appearance has completely resolved. Eyes:Pupils Equal Round Reactive to light, Extraocular movements intact,  Conjunctiva without redness, discharge or icterus. Neck/lymp/endocrine: Supple, very mild bilateral anterior cervical lymphadenopathy with tenderness. Chest: No cough or shortness of breath Skin: No rashes, purpura or petechiae.  Neuro:Alert. Oriented.  ASSESSMENT AND PLAN: WARD AMMIRATI is a 64 y.o. female present for  COVID-19 related oral mucositis/benign migratory glossitis -Symptoms likely related to COVID-19 infection.  -Her exam today shows great improvement.  Appears almost 90% resolved.  -Continue Biotene mouth rinse OTC . -Continue Peridex twice daily for another 7 days.  Continue nystatin swish for another 5 days.  Finish 10-day course of Diflucan in progress.   -Follow-up with dentist for teeth and gum check.  There has been reports of changes seen in the teeth and gum when they are oral manifestations of COVID-19. Avoid spicy foods until completely resolved. Will refer to ENT today to be complete, since she still is having some mild sore throat symptoms.  If symptoms have resolved by the time ENT works her in, she can always cancel appointment. Follow-up as needed  Orders Placed This Encounter  Procedures  . Ambulatory referral to ENT   Meds ordered this encounter  Medications  . nystatin (MYCOSTATIN) 100000 UNIT/ML suspension    Sig: Take 5 mLs (500,000 Units total) by mouth 4 (four) times daily.    Dispense:  100 mL    Refill:  [place], DO 08/31/2019

## 2019-09-04 ENCOUNTER — Telehealth: Payer: Self-pay | Admitting: Neurology

## 2019-09-04 NOTE — Telephone Encounter (Signed)
PA approved for the patient through [employer]. Approved until 08/01/2020

## 2019-09-04 NOTE — Telephone Encounter (Signed)
PA completed for the patient through cover my meds/[employer] for generic tecfidera.  [id number] Will wait for response from plan.

## 2019-09-05 ENCOUNTER — Other Ambulatory Visit: Payer: Self-pay | Admitting: Neurology

## 2019-09-05 MED ORDER — DIMETHYL FUMARATE 120 MG PO CPDR: 120.0000 mg | DELAYED_RELEASE_CAPSULE | Freq: Two times a day (BID) | ORAL | 1 refills | Status: DC

## 2019-09-05 NOTE — Telephone Encounter (Signed)
Daisy Bennett with [facility] called to inform patient only has 4 days left of her tecfidera and they are needing a new prescription sent over in order to fill

## 2019-09-05 NOTE — Telephone Encounter (Signed)
[facility] was not listed as a pharmacy on file for the patient. I have refilled and sent to that [facility] pharmacy for the patient as requested.

## 2019-09-06 ENCOUNTER — Other Ambulatory Visit: Payer: Self-pay | Admitting: Neurology

## 2019-09-06 MED ORDER — DIMETHYL FUMARATE 120 MG PO CPDR: 120.0000 mg | DELAYED_RELEASE_CAPSULE | Freq: Two times a day (BID) | ORAL | 1 refills | Status: DC

## 2019-09-06 NOTE — Telephone Encounter (Signed)
Kim from [facility] in [place], [place] called stating that the pt's Dimethyl Fumarate (TECFIDERA) 120 MG CPDR prescription needs to be faxed to [phone] She states that there are two types of "[facility]" and they are "[facility]" one word not two.  Please advise. [phone]

## 2019-09-06 NOTE — Telephone Encounter (Signed)
I  Have corrected the information and resent the script to the correct pharmacy. I have taken the several other pharmacies that were listed out of patient's profile to reduce confusion.

## 2019-09-10 ENCOUNTER — Other Ambulatory Visit: Payer: Self-pay | Admitting: Neurology

## 2019-09-10 MED ORDER — DIMETHYL FUMARATE 120 MG PO CPDR: 120.0000 mg | DELAYED_RELEASE_CAPSULE | Freq: Two times a day (BID) | ORAL | 1 refills | Status: DC

## 2019-09-10 NOTE — Telephone Encounter (Signed)
I called listed number and they need a PA for 120mg  Dimethyl Fumarate (TECFIDERA) 120 MG CPDR even though they have one on file for a different dosage. I stated patients decrease dosage of 120 is on file as of  03/2019. I did PA with clinical questions and PA number is [id number]. They dont need a prescription. Decision will be made within 24 to 72 hours.

## 2019-09-10 NOTE — Telephone Encounter (Signed)
[facility]arm[facility] called to verify they have not received the medication and the patient is needing refills please follow up  For the patient Dimethyl Fumarate ([place]) [place], [place] [address] [place] [address]  Phone: [phone] Fax: [phone]  cript

## 2019-09-10 NOTE — Telephone Encounter (Signed)
I called [facility] listed number to state PA was approve by another RN on 09/04/2019.i stated another fax was sent to do another PA. I gave her the PA authorization number of [id number] and approve till 08/01/2020. They will call us back if they have any other questions. They stated to disregard the PA.

## 2019-09-10 NOTE — Telephone Encounter (Signed)
Script was sent and a receipt of confirmation was provided Receipt confirmed by pharmacy (09/06/2019 10:09 AM EST.   I have once more resent the script and received a confirmation Receipt confirmed by pharmacy (09/10/2019 1:12 PM EST  Both times it was sent to the pharmacy mentioned below.

## 2019-09-10 NOTE — Telephone Encounter (Signed)
Pharmacy called in and stated the patients script from [place] [id number] is showing 240mg  and patients plan needs to be called to get an updated script   Plan Phone Number - [phone]  Patient ID# [id number]

## 2019-09-11 NOTE — Telephone Encounter (Signed)
Receive fax from [facility] that [address] 120mg  was approve till 1231/2021. Contact number is  [phone].

## 2019-09-20 ENCOUNTER — Ambulatory Visit: Payer: PRIVATE HEALTH INSURANCE | Admitting: Cardiovascular Disease

## 2019-09-24 NOTE — Progress Notes (Signed)
Patient ID: Daisy Bennett, female   DOB: 03-23-1956, 64 y.o.   MRN: [id number]    64 y.o. seen f/u CAD  post stenting 2011  She had a non ST elevation MI with subsequent stenting of an OM. She has diffuse 3VD with a total right that is collateralized. She had smoewhat atypical presentation with her DM. Marland KitchenLast myovue done 06/16/18 low risk non ischemic EF 68%  Lots of dermatology issues had multiple lesions removed from right leg and thigh  October 2017:  Fell and hit head with subdural Memory issues since then. 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   Unfortunately with stress is smoking a bit again  No chest pain needs new nitro   COVID positive December burning and bumps on her tongue Had COVID outpatient infusion Rx RX with peridex, biotene mouth rinse and Diflucan Referred to dentist and ENT  Breathing better Bruises easy Tongue is better   ROS: Denies fever, malais, weight loss, blurry vision, decreased visual acuity, cough, sputum, SOB, hemoptysis, pleuritic pain, palpitaitons, heartburn, abdominal pain, melena, lower extremity edema, claudication, or rash.  All other systems reviewed and negative  General: BP 134/76   Pulse 74   Ht 5\' 6"  (1.676 m)   Wt 192 lb (87.1 kg)   SpO2 98%   BMI 30.99 kg/m  Affect appropriate Healthy:  appears stated age 50: normal  Neck supple with no adenopathy JVP normal no bruits no thyromegaly Lungs clear with no wheezing and good diaphragmatic motion Heart:  S1/S2 no murmur, no rub, gallop or click PMI normal Abdomen: benighn, BS positve, no tenderness, no AAA no bruit.  No HSM or HJR Distal pulses intact with no bruits No edema Neuro non-focal Skin warm and dry No muscular weakness   Current Outpatient Medications  Medication Sig Dispense Refill  . amitriptyline (ELAVIL) 100 MG tablet Take 1 tablet (100 mg  total) by mouth daily. 90 tablet 1  . amLODipine (NORVASC) 10 MG tablet Take 1 tablet (10 mg total) by mouth daily. Please call office for appointment prior to further refills [phone] 90 tablet 0  . aspirin EC 81 MG tablet Take 1 tablet (81 mg total) daily by mouth.    . carvedilol (COREG) 6.25 MG tablet Take 1 tablet (6.25 mg total) by mouth 2 (two) times daily. Please call office for appointment prior to further refills [phone] 180 tablet 0  . cholecalciferol (VITAMIN D) 1000 units tablet Take 1,000 Units by mouth daily.    . Dimethyl Fumarate (TECFIDERA) 120 MG CPDR Take 120 mg by mouth 2 (two) times daily. 180 capsule 1  . esomeprazole (NEXIUM) 20 MG capsule Take 1 capsule (20 mg total) by mouth daily at 12 noon. 90 capsule 1  . fenofibrate 160 MG tablet TAKE 1 TABLET (160 MG TOTAL) BY MOUTH DAILY. OFFICE VISIT NEEDED 90 tablet 3  . Ferrous Sulfate (IRON) 325 (65 FE) MG TABS Take 325 mg by mouth daily.     . fish oil-omega-3 fatty acids 1000 MG capsule Take 1 g by mouth daily.     [name] gabapentin (NEURONTIN) 600 MG tablet TAKE 1 TABLET BY MOUTH TWICE A DAY 180 tablet 3  . glimepiride (AMARYL) 2 MG tablet 2 tab in the morning and  1/2 tab in the evening. 270 tablet 1  . hydrochlorothiazide (HYDRODIURIL) 50 MG tablet Take 1 tablet (50  mg total) by mouth daily. 90 tablet 1  . levothyroxine (SYNTHROID) 112 MCG tablet Take 1 tablet (112 mcg total) by mouth daily. 90 tablet 0  . losartan (COZAAR) 100 MG tablet Take 1 tablet (100 mg total) by mouth daily. 90 tablet 2  . metFORMIN (GLUCOPHAGE) 1000 MG tablet Take 1 tablet (1,000 mg total) by mouth 2 (two) times daily with a meal. 180 tablet 5  . nitroGLYCERIN (NITROSTAT) 0.4 MG SL tablet Place 1 tablet (0.4 mg total) under the tongue every 5 (five) minutes x 3 doses as needed for chest pain. 25 tablet 4  . potassium chloride (KLOR-CON) 10 MEQ tablet Take 1 tablet (10 mEq total) by mouth daily. 90 tablet 1  . prasugrel (EFFIENT) 10 MG TABS tablet  TAKE 1 TABLET BY MOUTH EVERY DAY 90 tablet 0  . rosuvastatin (CRESTOR) 10 MG tablet TAKE 1 TABLET BY MOUTH DAILY. PLEASE MAKE YEARLY APPT WITH DR. Johnsie Cancel FOR OCTOBER FOR FUTURE REFILLS 90 tablet 0  . venlafaxine XR (EFFEXOR XR) 75 MG 24 hr capsule Take 3 capsules (225 mg total) by mouth daily with breakfast. 270 capsule 1   No current facility-administered medications for this visit.    Allergies  Betaseron [interferon beta-1b], Provigil [modafinil], Topamax [topiramate], and Doxycycline  Electrocardiogram:  06/01/18 SR rate 80 nonspecific ST changes   Assessment and Plan  CAD: Collateralized RCA and stent to OM in 2011 no angina continue medical Rx  New nitro called in Low risk myovue 06/16/17 normal perfusion no ischemia EF 68% Discussed continuing Effient due to non revascularize disease She has mild bruising and agrees   HTN: Well controlled.  Continue current medications and low sodium Dash type diet.    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

## 2019-09-26 ENCOUNTER — Encounter: Payer: Self-pay | Admitting: Cardiovascular Disease

## 2019-09-26 ENCOUNTER — Ambulatory Visit (INDEPENDENT_AMBULATORY_CARE_PROVIDER_SITE_OTHER): Payer: Medicare Other | Admitting: Cardiovascular Disease

## 2019-09-26 ENCOUNTER — Other Ambulatory Visit: Payer: Self-pay

## 2019-09-26 VITALS — BP 134/76 | HR 74 | Ht 66.0 in | Wt 192.0 lb

## 2019-09-26 DIAGNOSIS — I2510 Atherosclerotic heart disease of native coronary artery without angina pectoris: Secondary | ICD-10-CM | POA: Diagnosis not present

## 2019-09-26 DIAGNOSIS — Z87891 Personal history of nicotine dependence: Secondary | ICD-10-CM

## 2019-09-26 NOTE — Patient Instructions (Addendum)
Medication Instructions:  *If you need a refill on your cardiac medications before your next appointment, please call your pharmacy*  Lab Work: If you have labs (blood work) drawn today and your tests are completely normal, you will receive your results only by: [name] MyChart Message (if you have MyChart) OR . A paper copy in the mail If you have any lab test that is abnormal or we need to change your treatment, we will call you to review the results.  Testing/Procedures: Non-Cardiac CT scanning for lung cancer screening, (CAT scanning), is a noninvasive, special x-ray that produces cross-sectional images of the body using x-rays and a computer. CT scans help physicians diagnose and treat medical conditions. For some CT exams, a contrast material is used to enhance visibility in the area of the body being studied. CT scans provide greater clarity and reveal more details than regular x-ray exams.  Follow-Up: At [facility], you and your health needs are our priority.  As part of our continuing mission to provide you with exceptional heart care, we have created designated Provider Care Teams.  These Care Teams include your primary Cardiologist (physician) and Advanced Practice Providers (APPs -  Physician Assistants and Nurse Practitioners) who all work together to provide you with the care you need, when you need it.  Your next appointment:   12 month(s)  The format for your next appointment:   In Person  Provider:   You may see Jenkins Rouge, MD or one of the following Advanced Practice Providers on your designated Care Team:    Truitt Merle, NP  Cecilie Kicks, NP  Kathyrn Drown, NP

## 2019-10-02 ENCOUNTER — Other Ambulatory Visit: Payer: Self-pay | Admitting: Cardiovascular Disease

## 2019-10-02 ENCOUNTER — Other Ambulatory Visit: Payer: Self-pay

## 2019-10-02 ENCOUNTER — Ambulatory Visit (INDEPENDENT_AMBULATORY_CARE_PROVIDER_SITE_OTHER): Payer: Medicare Other

## 2019-10-02 DIAGNOSIS — Z87891 Personal history of nicotine dependence: Secondary | ICD-10-CM | POA: Diagnosis not present

## 2019-10-24 ENCOUNTER — Other Ambulatory Visit: Payer: Self-pay

## 2019-10-24 MED ORDER — FENOFIBRATE 160 MG PO TABS: 160.0000 mg | ORAL_TABLET | Freq: Every day | ORAL | 1 refills | Status: DC

## 2019-10-30 ENCOUNTER — Other Ambulatory Visit: Payer: Self-pay | Admitting: Cardiovascular Disease

## 2019-10-31 ENCOUNTER — Telehealth: Payer: Self-pay

## 2019-10-31 MED ORDER — LEVOTHYROXINE SODIUM 112 MCG PO TABS: 112.0000 ug | ORAL_TABLET | Freq: Every day | ORAL | 0 refills | Status: DC

## 2019-10-31 NOTE — Telephone Encounter (Signed)
Refill request for pts Levothyroxine. Pt is due in April for F/U appt. 30 days sent to pharmacy. Note placed for pt to make MD appt

## 2019-11-19 ENCOUNTER — Other Ambulatory Visit: Payer: Self-pay

## 2019-11-19 MED ORDER — DIMETHYL FUMARATE 120 MG PO CPDR: 120.0000 mg | DELAYED_RELEASE_CAPSULE | Freq: Two times a day (BID) | ORAL | 1 refills | Status: DC

## 2019-11-21 NOTE — Progress Notes (Signed)
PATIENT: Daisy Bennett DOB: 03-01-1956  REASON FOR VISIT: follow up HISTORY FROM: patient  HISTORY OF PRESENT ILLNESS: Today 11/22/19  Daisy Bennett is a 64 year old female with history of multiple sclerosis, currently on Tecfidera.  She is on low-dose Tecfidera 120 mg twice daily, due to low absolute lymphocyte count.  MRI of the brain was stable in July 2020 from September 2017.  She remains on gabapentin 600 mg twice a day for left-sided sciatica.  She has issues with balance, has had a few falls as such.  She remains active, enjoys working in her yard.  Her husband recently retired, they are planning to do some traveling.  Other than balance, denies any daily symptoms of MS. She remains overall stable.  She has routine follow-up with her PCP tomorrow.  She presents today for evaluation unaccompanied.  HISTORY 02/12/2019 Dr. Jannifer Bennett: Daisy Bennett is a 64 year old right-handed white female with a history of multiple sclerosis, currently on Tecfidera.  The patient tolerates the drug fairly well, she has been running absolute lymphocyte counts of around 0.5 on the medication.  The patient reports no new symptoms since last seen, she denies any numbness or weakness of the face, arms, legs, she denies any vision changes or changes in the control the bowels or the bladder.  She denies any significant balance problems but she did fall on one occasion in March 2020 and struck her head when she was stooping over to help her dog.  The patient had black eyes, she had some decline in cognitive function temporarily after the fall and may have had a postconcussive syndrome.  She is back to her usual cognitive baseline.  She has seen a chiropractor for her right leg sciatica type pain which has been beneficial.  The patient returns to this office for an evaluation.   REVIEW OF SYSTEMS: Out of a complete 14 system review of symptoms, the patient complains only of the following symptoms, and all other reviewed  systems are negative.  Balance problems  ALLERGIES: Allergies  Allergen Reactions  . Betaseron [Interferon Beta-1b] Other (See Comments)    Increased LFT's  . Provigil [Modafinil] Swelling    Tongue swelling and sores  . Topamax [Topiramate]     Cognitive slowing  . Doxycycline Other (See Comments)    Thrush,dizziness    HOME MEDICATIONS: Outpatient Medications Prior to Visit  Medication Sig Dispense Refill  . amitriptyline (ELAVIL) 100 MG tablet Take 1 tablet (100 mg total) by mouth daily. 90 tablet 1  . amLODipine (NORVASC) 10 MG tablet Take 1 tablet (10 mg total) by mouth daily. Please call office for appointment prior to further refills [phone] 90 tablet 0  . aspirin EC 81 MG tablet Take 1 tablet (81 mg total) daily by mouth.    . carvedilol (COREG) 6.25 MG tablet Take 1 tablet (6.25 mg total) by mouth 2 (two) times daily. Please call office for appointment prior to further refills [phone] 180 tablet 0  . cholecalciferol (VITAMIN D) 1000 units tablet Take 1,000 Units by mouth daily.    . Dimethyl Fumarate (TECFIDERA) 120 MG CPDR Take 120 mg by mouth 2 (two) times daily. 180 capsule 1  . esomeprazole (NEXIUM) 20 MG capsule Take 1 capsule (20 mg total) by mouth daily at 12 noon. 90 capsule 1  . fenofibrate 160 MG tablet Take 1 tablet (160 mg total) by mouth daily. OFFICE VISIT NEEDED 90 tablet 1  . Ferrous Sulfate (IRON) 325 (65 FE) MG TABS  Take 325 mg by mouth daily.     . fish oil-omega-3 fatty acids 1000 MG capsule Take 1 g by mouth daily.     [name] gabapentin (NEURONTIN) 600 MG tablet TAKE 1 TABLET BY MOUTH TWICE A DAY 180 tablet 3  . glimepiride (AMARYL) 2 MG tablet 2 tab in the morning and  1/2 tab in the evening. 270 tablet 1  . hydrochlorothiazide (HYDRODIURIL) 50 MG tablet Take 1 tablet (50 mg total) by mouth daily. 90 tablet 1  . levothyroxine (SYNTHROID) 112 MCG tablet Take 1 tablet (112 mcg total) by mouth daily. NEED APPT FOR REFILLS 30 tablet 0  . losartan  (COZAAR) 100 MG tablet Take 1 tablet (100 mg total) by mouth daily. 90 tablet 2  . metFORMIN (GLUCOPHAGE) 1000 MG tablet Take 1 tablet (1,000 mg total) by mouth 2 (two) times daily with a meal. 180 tablet 5  . nitroGLYCERIN (NITROSTAT) 0.4 MG SL tablet Place 1 tablet (0.4 mg total) under the tongue every 5 (five) minutes x 3 doses as needed for chest pain. 25 tablet 4  . potassium chloride (KLOR-CON) 10 MEQ tablet Take 1 tablet (10 mEq total) by mouth daily. 90 tablet 1  . prasugrel (EFFIENT) 10 MG TABS tablet TAKE 1 TABLET BY MOUTH EVERY DAY 90 tablet 3  . rosuvastatin (CRESTOR) 10 MG tablet TAKE 1 TABLET BY MOUTH DAILY. PLEASE MAKE YEARLY APPT WITH DR. Johnsie Cancel FOR OCTOBER FOR FUTURE REFILLS 90 tablet 3  . venlafaxine XR (EFFEXOR XR) 75 MG 24 hr capsule Take 3 capsules (225 mg total) by mouth daily with breakfast. 270 capsule 1   No facility-administered medications prior to visit.    PAST MEDICAL HISTORY: Past Medical History:  Diagnosis Date  . Basal cell carcinoma   . Brain bleed ([place]) 2017  . COVID-19 08/2019  . DM (diabetes mellitus) ([place])   . Former smoker   . Goiter, unspecified   . History of colonic polyps 2012  . Multiple sclerosis ([place])   . Non-ST elevated myocardial infarction (non-STEMI) ([place])   . SCCA (squamous cell carcinoma) of skin    right shin  . SEMI (subendocardial myocardial infarction) ([place]) 09/29/09   TOTAL RCA WITH STENT TO OM BRANCH  . Shingles   . Squamous cell carcinoma 2019   lower ext  . Unspecified essential hypertension   . Unspecified hypothyroidism     PAST SURGICAL HISTORY: Past Surgical History:  Procedure Laterality Date  . CATARACT SURGERY  12/10  . CESAREAN SECTION    . CORONARY ANGIOPLASTY WITH STENT PLACEMENT  2011  . KNEE SURGERY    . leg surgery     squamous ccell removed   . TONSILLECTOMY    . VESICOVAGINAL FISTULA CLOSURE W/ TAH      FAMILY HISTORY: Family History  Problem Relation Age of Onset  . Hypertension Mother   .  Heart failure Father   . Diabetes Father   . Aneurysm Brother        THORACIC/ABD ANEURYSM  . Prostate cancer Brother 16  . Diabetes Brother   . Fibromyalgia Sister   . Brain cancer Maternal Grandmother 64  . Multiple sclerosis Neg Hx   . Colon cancer Neg Hx   . Esophageal cancer Neg Hx     SOCIAL HISTORY: Social History   Socioeconomic History  . Marital status: Married    Spouse name: Not on file  . Number of children: 2  . Years of education: 26  . Highest education  level: Not on file  Occupational History  . Occupation: Therapist, music  Tobacco Use  . Smoking status: Former Smoker    Packs/day: 1.00    Quit date: 12/16/2015    Years since quitting: 3.9  . Smokeless tobacco: Never Used  Substance and Sexual Activity  . Alcohol use: Yes    Alcohol/week: 0.0 standard drinks    Comment: OCCASIONAL ALCOHOL USE.  . Drug use: No  . Sexual activity: Yes  Other Topics Concern  . Not on file  Social History Narrative   The patient lives with husband. 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    Patient drinks occasionally, does not chew tobacco, does not use recreational drugs. She does drink occasional caffeine. She does wear her seatbelt. She does wear bike helmet riding a bike. She does exercise 3 times a week. She does not wear hearing aids or dentures. There is a smoke alarm in her home. There are no firearms in her home. She feels safe in her relationship. She has never experienced physical abuse.   Patient sleeps 7-8 hours a night.   Patient drinks 2 cups of caffeine daily.   Patient is right handed.   Social Determinants of Health   Financial Resource Strain:   . Difficulty of Paying Living Expenses:   Food Insecurity:   . Worried About Charity fundraiser in the Last Year:   . Arboriculturist in the Last Year:   Transportation Needs:   . Film/video editor (Medical):   [name] Lack of Transportation (Non-Medical):   Physical  Activity:   . Days of Exercise per Week:   . Minutes of Exercise per Session:   Stress:   . Feeling of Stress :   Social Connections:   . Frequency of Communication with Friends and Family:   . Frequency of Social Gatherings with Friends and Family:   . Attends Religious Services:   . Active Member of Clubs or Organizations:   . Attends Archivist Meetings:   [name] Marital Status:   Intimate Partner Violence:   . Fear of Current or Ex-Partner:   . Emotionally Abused:   [name] Physically Abused:   . Sexually Abused:    PHYSICAL EXAM  Vitals:   11/22/19 0851  BP: 138/70  Pulse: 77  Temp: (!) 97.3 F (36.3 C)  Weight: 192 lb (87.1 kg)  Height: 5\' 2"  (1.575 m)   Body mass index is 35.12 kg/m.  Generalized: Well developed, in no acute distress   Neurological examination  Mentation: Alert oriented to time, place, history taking. Follows all commands speech and language fluent Cranial nerve II-XII: Pupils were equal round reactive to light. Extraocular movements were full, visual field were full on confrontational test. Facial sensation and strength were normal. Head turning and shoulder shrug were normal and symmetric. Motor: The motor testing reveals 5 over 5 strength of all 4 extremities. Good symmetric motor tone is noted throughout.  Sensory: Sensory testing is intact to soft touch on all 4 extremities. No evidence of extinction is noted.  Coordination: Cerebellar testing reveals good finger-nose-finger and heel-to-shin bilaterally.  Gait and station: Gait is normal.  Was not comfortable performing tandem gait. Romberg is negative, but unsteady. No drift is seen.  Reflexes: Deep tendon reflexes are symmetric and normal bilaterally.   DIAGNOSTIC DATA (LABS, IMAGING, TESTING) - I reviewed patient records, labs, notes, testing and imaging myself where available.  Lab Results  Component Value Date   WBC 8.9 04/12/2019   HGB 12.0 04/12/2019   HCT 34.9 04/12/2019   MCV 93  04/12/2019   PLT 171 04/12/2019      Component Value Date/Time   NA 140 07/04/2019 0911   NA 139 02/12/2019 1107   K 4.0 07/04/2019 0911   CL 101 07/04/2019 0911   CO2 27 07/04/2019 0911   GLUCOSE 124 (H) 07/04/2019 0911   BUN 18 07/04/2019 0911   BUN 19 02/12/2019 1107   CREATININE 1.19 (H) 07/04/2019 0911   CALCIUM 9.9 07/04/2019 0911   PROT 7.1 07/04/2019 0911   PROT 6.8 02/12/2019 1107   ALBUMIN 4.5 02/12/2019 1107   AST 14 07/04/2019 0911   ALT 13 07/04/2019 0911   ALKPHOS 49 02/12/2019 1107   BILITOT 0.3 07/04/2019 0911   BILITOT <0.2 02/12/2019 1107   GFRNONAA 53 (L) 02/12/2019 1107   GFRAA 61 02/12/2019 1107   Lab Results  Component Value Date   CHOL 160 07/04/2019   HDL 70 07/04/2019   LDLCALC 73 07/04/2019   TRIG 90 07/04/2019   CHOLHDL 2.3 07/04/2019   Lab Results  Component Value Date   HGBA1C 6.0 (A) 07/04/2019   HGBA1C 6.0 07/04/2019   HGBA1C 6.0 07/04/2019   HGBA1C 6.0 07/04/2019   Lab Results  Component Value Date   VITAMINB12 682 07/22/2015   Lab Results  Component Value Date   TSH 1.13 07/04/2019      ASSESSMENT AND PLAN 64 y.o. year old female  has a past medical history of Basal cell carcinoma, Brain bleed ([place]) (2017), COVID-19 (08/2019), DM (diabetes mellitus) ([place]), Former smoker, Goiter, unspecified, History of colonic polyps (2012), Multiple sclerosis ([place]), Non-ST elevated myocardial infarction (non-STEMI) ([place]), SCCA (squamous cell carcinoma) of skin, SEMI (subendocardial myocardial infarction) ([place]) (09/29/09), Shingles, Squamous cell carcinoma (2019), Unspecified essential hypertension, and Unspecified hypothyroidism. here with:  1.  Relapsing remitting multiple sclerosis   Overall, she seems to be stable and overall doing well.  She will remain on low-dose Tecfidera 120 mg twice a day, along with gabapentin.  I messaged her primary doctor to have a CBC with differential, CMP collected tomorrow along with her other routine blood  work.  I will review these once they result, specifically the absolute lymphocyte count.  She will follow-up in 6 months or sooner if needed.  I spent 20 minutes of face-to-face and non-face-to-face time with patient.  This included previsit chart review, lab review, study review, order entry, electronic health record documentation, patient education.   Butler Denmark, AGNP-C, DNP 11/22/2019, 9:14 AM [facility] [address], [place], [place] [address] [phone]

## 2019-11-22 ENCOUNTER — Encounter: Payer: Self-pay | Admitting: Neurology

## 2019-11-22 ENCOUNTER — Other Ambulatory Visit: Payer: Self-pay

## 2019-11-22 ENCOUNTER — Ambulatory Visit (INDEPENDENT_AMBULATORY_CARE_PROVIDER_SITE_OTHER): Payer: Medicare Other | Admitting: Neurology

## 2019-11-22 VITALS — BP 138/70 | HR 77 | Temp 97.3°F | Ht 62.0 in | Wt 192.0 lb

## 2019-11-22 DIAGNOSIS — G35 Multiple sclerosis: Secondary | ICD-10-CM

## 2019-11-22 NOTE — Patient Instructions (Signed)
Have you primary doctor check CBC with diff, CMP tomorrow at your appointment  Continue current medications  See you back in 6 months

## 2019-11-23 ENCOUNTER — Ambulatory Visit (INDEPENDENT_AMBULATORY_CARE_PROVIDER_SITE_OTHER): Payer: Medicare Other | Admitting: Family Medicine

## 2019-11-23 ENCOUNTER — Encounter: Payer: Self-pay | Admitting: Family Medicine

## 2019-11-23 VITALS — BP 134/74 | HR 73 | Temp 97.2°F | Resp 16 | Ht 62.0 in | Wt 194.0 lb

## 2019-11-23 DIAGNOSIS — E039 Hypothyroidism, unspecified: Secondary | ICD-10-CM

## 2019-11-23 DIAGNOSIS — N183 Chronic kidney disease, stage 3 unspecified: Secondary | ICD-10-CM

## 2019-11-23 DIAGNOSIS — I10 Essential (primary) hypertension: Secondary | ICD-10-CM

## 2019-11-23 DIAGNOSIS — G35 Multiple sclerosis: Secondary | ICD-10-CM | POA: Diagnosis not present

## 2019-11-23 DIAGNOSIS — I25118 Atherosclerotic heart disease of native coronary artery with other forms of angina pectoris: Secondary | ICD-10-CM

## 2019-11-23 DIAGNOSIS — E782 Mixed hyperlipidemia: Secondary | ICD-10-CM

## 2019-11-23 DIAGNOSIS — E119 Type 2 diabetes mellitus without complications: Secondary | ICD-10-CM

## 2019-11-23 DIAGNOSIS — R682 Dry mouth, unspecified: Secondary | ICD-10-CM | POA: Insufficient documentation

## 2019-11-23 DIAGNOSIS — Z7902 Long term (current) use of antithrombotics/antiplatelets: Secondary | ICD-10-CM

## 2019-11-23 DIAGNOSIS — K1230 Oral mucositis (ulcerative), unspecified: Secondary | ICD-10-CM

## 2019-11-23 DIAGNOSIS — R011 Cardiac murmur, unspecified: Secondary | ICD-10-CM

## 2019-11-23 LAB — CBC
HCT: 36.8 % (ref 36.0–46.0)
Hemoglobin: 12.5 g/dL (ref 12.0–15.0)
MCHC: 33.8 g/dL (ref 30.0–36.0)
MCV: 98.1 fl (ref 78.0–100.0)
Platelets: 139 10*3/uL — ABNORMAL LOW (ref 150.0–400.0)
RBC: 3.75 Mil/uL — ABNORMAL LOW (ref 3.87–5.11)
RDW: 12.8 % (ref 11.5–15.5)
WBC: 6.9 10*3/uL (ref 4.0–10.5)

## 2019-11-23 LAB — COMPREHENSIVE METABOLIC PANEL
ALT: 22 U/L (ref 0–35)
AST: 25 U/L (ref 0–37)
Albumin: 4.8 g/dL (ref 3.5–5.2)
Alkaline Phosphatase: 62 U/L (ref 39–117)
BUN: 18 mg/dL (ref 6–23)
CO2: 30 mEq/L (ref 19–32)
Calcium: 10 mg/dL (ref 8.4–10.5)
Chloride: 97 mEq/L (ref 96–112)
Creatinine, Ser: 1.02 mg/dL (ref 0.40–1.20)
GFR: 54.31 mL/min — ABNORMAL LOW (ref >60.00)
Glucose, Bld: 136 mg/dL — ABNORMAL HIGH (ref 70–99)
Potassium: 3.7 mEq/L (ref 3.5–5.1)
Sodium: 138 mEq/L (ref 135–145)
Total Bilirubin: 0.4 mg/dL (ref 0.2–1.2)
Total Protein: 7.2 g/dL (ref 6.0–8.3)

## 2019-11-23 LAB — HEMOGLOBIN A1C: Hgb A1c MFr Bld: 6.4 % (ref 4.6–6.5)

## 2019-11-23 MED ORDER — GLIMEPIRIDE 2 MG PO TABS: ORAL_TABLET | ORAL | 1 refills | Status: DC

## 2019-11-23 MED ORDER — POTASSIUM CHLORIDE ER 10 MEQ PO TBCR: 10.0000 meq | EXTENDED_RELEASE_TABLET | Freq: Every day | ORAL | 1 refills | Status: DC

## 2019-11-23 MED ORDER — AMITRIPTYLINE HCL 100 MG PO TABS: 100.0000 mg | ORAL_TABLET | Freq: Every day | ORAL | 1 refills | Status: DC

## 2019-11-23 MED ORDER — HYDROCHLOROTHIAZIDE 50 MG PO TABS: 50.0000 mg | ORAL_TABLET | Freq: Every day | ORAL | 1 refills | Status: DC

## 2019-11-23 MED ORDER — VENLAFAXINE HCL ER 75 MG PO CP24: 225.0000 mg | ORAL_CAPSULE | Freq: Every day | ORAL | 1 refills | Status: DC

## 2019-11-23 MED ORDER — LEVOTHYROXINE SODIUM 112 MCG PO TABS: 112.0000 ug | ORAL_TABLET | Freq: Every day | ORAL | 3 refills | Status: DC

## 2019-11-23 MED ORDER — ESOMEPRAZOLE MAGNESIUM 20 MG PO CPDR: 20.0000 mg | DELAYED_RELEASE_CAPSULE | Freq: Every day | ORAL | 1 refills | Status: DC

## 2019-11-23 NOTE — Progress Notes (Signed)
Daisy Bennett , 02-08-1956, 64 y.o., female MRN: [id number] Patient Care Team    Relationship Specialty Notifications Start End  Ma Hillock, DO PCP - General Family Medicine  03/10/15   Josue Hector, MD PCP - Cardiology Cardiology Admissions 05/31/18   [place], Jennefer Bravo, MD Referring Physician Dermatology  02/10/16   Kathrynn Ducking, MD Consulting Physician Neurology  05/18/16   Linda Hedges, [place] Physician Obstetrics and Gynecology  06/04/16   Anell Barr, OD  Optometry  08/17/16     Chief Complaint  Patient presents with  . Diabetes    med refill     Subjective: Daisy Bennett is a 64 y.o. female present for [facility] follow up Diabetes/morbid obesity: Patient presents for routine scheduled diabetes follow-up. Today she reports compliance with Amaryl  4 mg/'1mg'$  daily and metformin 1000 mg qd and she is tolerating them. She is still  not checking her sugars. Patient denies dizziness, hyperglycemic or hypoglycemic events. Patient denies numbness, tingling in the extremities or nonhealing wounds of feet.  She is prescribed gabapentin 600 mg BID.  POCT HgB A1C-->  6.5 --> 7.2--> 6.1 --> 6.0--> 6.4--> 6.5-->6.4--> 6.3>>> 7.2>>6.0>>6.0 > collected  today - PSV 10/2016--> competed series -  foot exam 02/28/2019 - Eye exam reports eye exam 03/2018 UTD.  Dr. Venetia Constable, yearly eye exams encouraged>> she will schedule>>reminded 3rd time - Microalbumin: on an ARB - flu shot UTD  Hypertension/hyperlipidemia: Today She reports compliance with her medication regimen of Crestor 10 mg daily, Effient 10 mg daily, losartan 100 mg daily, HCTZ 50 mg daily, fish oil 1000 mg daily, fenofibrate 160 mg daily Norvasc 10 mg, baby aspirin daily, Coreg 6.25 mg twice daily.  H/O CAD, NSTEMI, post stent 2011 ([facility])-collateralized RCA.  She follows with Dr. Johnsie Cancel.  Patient denies chest pain, shortness of breath, dizziness or lower extremity edema.  BMP:  08/10/2018 within normal limits CBC:  08/10/2018 within normal limits Lipids: 05/18/18 WNL, tg mildly elevated TSH: 05/18/2018, mildly elevated 5.71 Diet: low sodium Exercise: routine  depression/MS:  Patient reports she is doing well on her current regimen.  She is tolerating the Effexor at 225 mg daily.  Reports compliance with amitriptyline 100 mg nightly.  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  Feels her meds are working well for her.  She does have dry mouth from amitriptyline.  Depression screen [facility] 2/9 07/04/2019 02/15/2018 10/17/2017 06/17/2017 06/04/2016  Decreased Interest 0 0 0 0 0  Down, Depressed, Hopeless 0 0 0 0 0  PHQ - 2 Score 0 0 0 0 0  Altered sleeping - - 2 - -  Tired, decreased energy - - 0 - -  Change in appetite - - 1 - -  Feeling bad or failure about yourself  - - 0 - -  Trouble concentrating - - 0 - -  Moving slowly or fidgety/restless - - 0 - -  Suicidal thoughts - - 0 - -  PHQ-9 Score - - 3 - -    Allergies  Allergen Reactions  . Betaseron [Interferon Beta-1b] Other (See Comments)    Increased LFT's  . Provigil [Modafinil] Swelling    Tongue swelling and sores  . Topamax [Topiramate]     Cognitive slowing  . Doxycycline Other (See Comments)    Thrush,dizziness   Social History   Tobacco Use  . Smoking status: Former Smoker  Packs/day: 1.00    Quit date: 12/16/2015    Years since quitting: 3.9  . Smokeless tobacco: Never Used  Substance Use Topics  . Alcohol use: Yes    Alcohol/week: 0.0 standard drinks    Comment: OCCASIONAL ALCOHOL USE.   Past Medical History:  Diagnosis Date  . Basal cell carcinoma   . Brain bleed ([place]) 2017  . COVID-19 08/2019  . DM (diabetes mellitus) ([place])   . Former smoker   . 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 Goiter, unspecified   . History of colonic polyps 2012  . Multiple sclerosis ([place])   . Non-ST elevated myocardial infarction (non-STEMI) ([place])   . Oral mucositis 08/31/2019  . SCCA  (squamous cell carcinoma) of skin    right shin  . SEMI (subendocardial myocardial infarction) ([place]) 09/29/09   TOTAL RCA WITH STENT TO [facility] BRANCH  . Shingles   . Squamous cell carcinoma 2019   lower ext  . Unspecified essential hypertension   . Unspecified hypothyroidism    Past Surgical History:  Procedure Laterality Date  . CATARACT SURGERY  12/10  . CESAREAN SECTION    . CORONARY ANGIOPLASTY WITH STENT PLACEMENT  2011  . KNEE SURGERY    . leg surgery     squamous ccell removed   . TONSILLECTOMY    . VESICOVAGINAL FISTULA CLOSURE W/ TAH     Family History  Problem Relation Age of Onset  . Hypertension Mother   . Heart failure Father   . Diabetes Father   . Aneurysm Brother        THORACIC/ABD ANEURYSM  . Prostate cancer Brother 66  . Diabetes Brother   . Fibromyalgia Sister   . Brain cancer Maternal Grandmother 52  . Multiple sclerosis Neg Hx   . Colon cancer Neg Hx   . Esophageal cancer Neg Hx    Allergies as of 11/23/2019      Reactions   Betaseron [interferon Beta-1b] Other (See Comments)   Increased LFT's   Provigil [modafinil] Swelling   Tongue swelling and sores   Topamax [topiramate]    Cognitive slowing   Doxycycline Other (See Comments)   Thrush,dizziness      Medication List       Accurate as of November 23, 2019 11:59 PM. If you have any questions, ask your nurse or doctor.        amitriptyline 100 MG tablet Commonly known as: ELAVIL Take 1 tablet (100 mg total) by mouth daily.   amLODipine 10 MG tablet Commonly known as: NORVASC Take 1 tablet (10 mg total) by mouth daily. Please call office for appointment prior to further refills [phone]   aspirin EC 81 MG tablet Take 1 tablet (81 mg total) daily by mouth.   carvedilol 6.25 MG tablet Commonly known as: COREG Take 1 tablet (6.25 mg total) by mouth 2 (two) times daily. Please call office for appointment prior to further refills [phone]   cholecalciferol 1000 units  tablet Commonly known as: VITAMIN D Take 1,000 Units by mouth daily.   Dimethyl Fumarate 120 MG Cpdr Commonly known as: Tecfidera Take 120 mg by mouth 2 (two) times daily.   esomeprazole 20 MG capsule Commonly known as: NEXIUM Take 1 capsule (20 mg total) by mouth daily at 12 noon.   fenofibrate 160 MG tablet Take 1 tablet (160 mg total) by mouth daily. OFFICE VISIT NEEDED   fish oil-omega-3 fatty acids 1000 MG capsule Take 1 g by mouth daily.   gabapentin  600 MG tablet Commonly known as: NEURONTIN TAKE 1 TABLET BY MOUTH TWICE A DAY   glimepiride 2 MG tablet Commonly known as: AMARYL 2 tab in the morning and  1/2 tab in the evening.   hydrochlorothiazide 50 MG tablet Commonly known as: HYDRODIURIL Take 1 tablet (50 mg total) by mouth daily.   Iron 325 (65 Fe) MG Tabs Take 325 mg by mouth daily.   levothyroxine 112 MCG tablet Commonly known as: SYNTHROID Take 1 tablet (112 mcg total) by mouth daily. What changed: additional instructions Changed by: Howard Pouch, DO   losartan 100 MG tablet Commonly known as: COZAAR Take 1 tablet (100 mg total) by mouth daily.   metFORMIN 1000 MG tablet Commonly known as: GLUCOPHAGE Take 1 tablet (1,000 mg total) by mouth 2 (two) times daily with a meal.   nitroGLYCERIN 0.4 MG SL tablet Commonly known as: Nitrostat Place 1 tablet (0.4 mg total) under the tongue every 5 (five) minutes x 3 doses as needed for chest pain.   potassium chloride 10 MEQ tablet Commonly known as: KLOR-CON Take 1 tablet (10 mEq total) by mouth daily.   prasugrel 10 MG Tabs tablet Commonly known as: EFFIENT TAKE 1 TABLET BY MOUTH EVERY DAY   rosuvastatin 10 MG tablet Commonly known as: CRESTOR TAKE 1 TABLET BY MOUTH DAILY. PLEASE MAKE YEARLY APPT WITH DR. Johnsie Cancel FOR OCTOBER FOR FUTURE REFILLS   venlafaxine XR 75 MG 24 hr capsule Commonly known as: Effexor XR Take 3 capsules (225 mg total) by mouth daily with breakfast.       All past medical  history, surgical history, allergies, family history, immunizations andmedications were updated in the EMR today and reviewed under the history and medication portions of their EMR.     ROS: Negative, with the exception of above mentioned in HPI   Objective:  BP 134/74 (BP Location: Right Arm, Patient Position: Sitting, Cuff Size: Normal)   Pulse 73   Temp (!) 97.2 F (36.2 C) (Temporal)   Resp 16   Ht '5\' 2"'$  (1.575 m)   Wt 194 lb (88 kg)   SpO2 100%   BMI 35.48 kg/m  Body mass index is 35.48 kg/m. Gen: Afebrile. No acute distress. Nontoxic. Pleasant female.  HENT: AT. [place]. Dry mucous membranes.  Eyes:Pupils Equal Round Reactive to light, Extraocular movements intact,  Conjunctiva without redness, discharge or icterus. Neck/lymp/endocrine: Supple,no lymphadenopathy, no thyromegaly CV: RRR 1/6 SM, no edema, +2/4 P posterior tibialis pulses Chest: CTAB, no wheeze or crackles Abd: Soft.NTND. BS present. no Masses palpated.  Skin: no rashes, purpura or petechiae.  Neuro:  Normal gait. PERLA. EOMi. Alert. Oriented x3  Psych: Normal affect, dress and demeanor. Normal speech. Normal thought content and judgment.  No exam data present No results found. No results found for this or any previous visit (from the past 24 hour(s)).  Assessment/Plan: Daisy Bennett is a 64 y.o. female present for OV for  Diabetes mellitus without complication (HCC)/Morbid obesity -Has been stable.  We will collect A1c today with other labs. -Continue Amaryl to 4 mg in the morning and 1 mg in the evening -Continue Metformin 1000 mg daily (decreased secondary to kidney function) - increase exercise regimen and continue dietary modification.  -Refilled/continue gabapentin 600 mg nightly  POCT HgB A1C-->  6.5 --> 7.2--> 6.1 --> 6.0--> 6.4--> 6.5-->6.4--> 6.3>>> 7.2>>6.0>>6.0 > collected  today - PSV 10/2016--> competed series -  foot exam 02/28/2019 - Eye exam reports eye exam 03/2018 UTD.  Dr. Venetia Constable,  yearly eye exams encouraged>> she will schedule>>reminded 3rd time - Microalbumin: on an ARB - flu shot UTD -3-70-monthfollow-up  Hypertension/hyperlipidemia/Antiplatelet or antithrombotic long-term use/athersclerosis/hypokalemia/s/p stent placement:  - Stable. -CBC and CMP collected today - follows with cardiology, Dr. NJohnsie Cancelwho prescribes medications.  - low salt, increase exercise.  - Continue to monitor at home if above 140/90 will need to see sooner and discuss.  -Continue Kdur 10 meq/d -Continue fenofibrate -Continue statin - continue Effient and baby aspirin.  Cardiology feels she needs to continue both. - maintain routine cardiology follow ups.   depression/hypothyroid//MS:  -Stable.   -Continue Effexor 225 mg daily -Continue amitriptyline 100 mg nightly.  She is having significant dry mouth likely from this medication. -Continue gabapentin-prescribed by neurology - Tecfidera prescribed by neurology.  -Continue/refilled amitriptyline 75 mg nightly, refills provided.  Oral mucositis/dry mouth: -Patient had rather significant oral mucositis from Covid infection.  This has completely resolved and therefore ENT referral was placed on hold.  Patient reports today she would like ENT referral again for continued dry mouth and pain on her tongue. -Patient is prescribed amitriptyline and possible cause of continued dry mouth.  She is aware this. -Increase Biotene OTC to 3 times daily  -Continue adequate hydration -ENT referral placed again for her today.   Routine follow-up every 4 months.    Reviewed expectations re: course of current medical issues.  Discussed self-management of symptoms.  Outlined signs and symptoms indicating need for more acute intervention.  Patient verbalized understanding and all questions were answered.  Patient received an After-Visit Summary.    Orders Placed This Encounter  Procedures  . CBC  . Comp Met (CMET)  . Hemoglobin A1c  .  Ambulatory referral to ENT   Meds ordered this encounter  Medications  . venlafaxine XR (EFFEXOR XR) 75 MG 24 hr capsule    Sig: Take 3 capsules (225 mg total) by mouth daily with breakfast.    Dispense:  270 capsule    Refill:  1  . potassium chloride (KLOR-CON) 10 MEQ tablet    Sig: Take 1 tablet (10 mEq total) by mouth daily.    Dispense:  90 tablet    Refill:  1    Do not send automatic refills.  .Marland Kitchenlevothyroxine (SYNTHROID) 112 MCG tablet    Sig: Take 1 tablet (112 mcg total) by mouth daily.    Dispense:  90 tablet    Refill:  3  . hydrochlorothiazide (HYDRODIURIL) 50 MG tablet    Sig: Take 1 tablet (50 mg total) by mouth daily.    Dispense:  90 tablet    Refill:  1  . glimepiride (AMARYL) 2 MG tablet    Sig: 2 tab in the morning and  1/2 tab in the evening.    Dispense:  270 tablet    Refill:  1  . esomeprazole (NEXIUM) 20 MG capsule    Sig: Take 1 capsule (20 mg total) by mouth daily at 12 noon.    Dispense:  90 capsule    Refill:  1  . amitriptyline (ELAVIL) 100 MG tablet    Sig: Take 1 tablet (100 mg total) by mouth daily.    Dispense:  90 tablet    Refill:  1    Referral Orders     Ambulatory referral to ENT    Note is dictated utilizing voice recognition software. Although note has been proof read prior to signing, occasional typographical errors still can be  missed. If any questions arise, please do not hesitate to call for verification.   electronically signed by:  Howard Pouch, DO  [place]

## 2019-11-23 NOTE — Progress Notes (Signed)
I have read the note, and I agree with the clinical assessment and plan.  Aideliz Garmany K Ramah Langhans   

## 2019-11-23 NOTE — Patient Instructions (Addendum)
COVID-19 Vaccine Information can be found at: [email or web address] For questions related to vaccine distribution or appointments, please email [email or web address] or call [phone].  Covid Vaccine appointment go to [email or web address].  I have refilled your medications. No changes for now.  We will call you with labs and forward results to neuro.  I will refer you to ENT again, they will call you to schedule.  Follow up 4 months on chronic conditions.

## 2019-11-26 ENCOUNTER — Telehealth: Payer: Self-pay | Admitting: Family Medicine

## 2019-11-26 ENCOUNTER — Telehealth: Payer: Self-pay | Admitting: Neurology

## 2019-11-26 ENCOUNTER — Encounter: Payer: Self-pay | Admitting: Family Medicine

## 2019-11-26 DIAGNOSIS — G35 Multiple sclerosis: Secondary | ICD-10-CM

## 2019-11-26 DIAGNOSIS — E119 Type 2 diabetes mellitus without complications: Secondary | ICD-10-CM

## 2019-11-26 MED ORDER — METFORMIN HCL 1000 MG PO TABS: 1000.0000 mg | ORAL_TABLET | Freq: Every day | ORAL | 1 refills | Status: DC

## 2019-11-26 MED ORDER — AMLODIPINE BESYLATE 10 MG PO TABS: 10.0000 mg | ORAL_TABLET | Freq: Every day | ORAL | 3 refills | Status: DC

## 2019-11-26 MED ORDER — CARVEDILOL 6.25 MG PO TABS: 6.2500 mg | ORAL_TABLET | Freq: Two times a day (BID) | ORAL | 3 refills | Status: DC

## 2019-11-26 NOTE — Telephone Encounter (Signed)
Please inform patient the following information: Her liver function is normal. Her kidney function has improved since last collection in December.  Continue to hydrate. Her blood counts are stable from prior collections-we will forward to her neurologist as well. Her A1c has mildly increased from 6.0-6.4.  Still rather good control and at goal, no changes to medications at this time.

## 2019-11-26 NOTE — Telephone Encounter (Signed)
Pt was called and given lab results, she verbalized understanding.  

## 2019-11-26 NOTE — Telephone Encounter (Signed)
Checked with keisha hinnant in [place] and this is not able to be added on.

## 2019-11-26 NOTE — Telephone Encounter (Signed)
Labs reviewed that were collected by PCP, differential was not collected as part of CBC.  Please check to see if this can be added on, if not have her come by the office.  This is informative, as we follow the absolute lymphocyte count as she is on a lower dose of Tecfidera.  Otherwise, labs show platelet level slightly low at 139, A1c was 6.4.

## 2019-11-27 NOTE — Telephone Encounter (Addendum)
I called pt and relayed that since diff was not done at pcp and  (pt on tecfidera), this will need to be done.  She will come in to our [facility],  I gave her hours and days of operation.  Pt was given lab results by her pcp.

## 2019-11-27 NOTE — Addendum Note (Signed)
Addended by: [name] on: 11/27/2019 02:12 PM   Modules accepted: Orders

## 2019-11-27 NOTE — Addendum Note (Signed)
Addended by: Brandon Melnick on: 11/27/2019 10:42 AM   Modules accepted: Orders

## 2019-12-05 ENCOUNTER — Other Ambulatory Visit: Payer: Self-pay

## 2019-12-05 ENCOUNTER — Other Ambulatory Visit (INDEPENDENT_AMBULATORY_CARE_PROVIDER_SITE_OTHER): Payer: Self-pay

## 2019-12-05 DIAGNOSIS — G35 Multiple sclerosis: Secondary | ICD-10-CM

## 2019-12-05 DIAGNOSIS — Z0289 Encounter for other administrative examinations: Secondary | ICD-10-CM

## 2019-12-06 ENCOUNTER — Telehealth: Payer: Self-pay | Admitting: Neurology

## 2019-12-06 LAB — CBC WITH DIFFERENTIAL/PLATELET
Basophils Absolute: 0 10*3/uL (ref 0.0–0.2)
Basos: 0 %
EOS (ABSOLUTE): 0.4 10*3/uL (ref 0.0–0.4)
Eos: 5 %
Hematocrit: 34.5 % (ref 34.0–46.6)
Hemoglobin: 11.9 g/dL (ref 11.1–15.9)
Immature Grans (Abs): 0 10*3/uL (ref 0.0–0.1)
Immature Granulocytes: 0 %
Lymphocytes Absolute: 0.6 10*3/uL — ABNORMAL LOW (ref 0.7–3.1)
Lymphs: 7 %
MCH: 33.4 pg — ABNORMAL HIGH (ref 26.6–33.0)
MCHC: 34.5 g/dL (ref 31.5–35.7)
MCV: 97 fL (ref 79–97)
Monocytes Absolute: 0.7 10*3/uL (ref 0.1–0.9)
Monocytes: 9 %
Neutrophils Absolute: 6 10*3/uL (ref 1.4–7.0)
Neutrophils: 79 %
Platelets: 150 10*3/uL (ref 150–450)
RBC: 3.56 x10E6/uL — ABNORMAL LOW (ref 3.77–5.28)
RDW: 12.3 % (ref 11.7–15.4)
WBC: 7.7 10*3/uL (ref 3.4–10.8)

## 2019-12-06 NOTE — Telephone Encounter (Signed)
-----   Message from Darleen Crocker, RN sent at 12/06/2019  9:54 AM EDT -----  ----- Message ----- From: [name], NP Sent: 12/06/2019   8:13 AM EDT To: Brandon Melnick, RN  Absolute lymphocyte count is stable 0.6 on Tecfidera. Keep her on the lower dose for now. We'll see her back in 6 months to recheck.

## 2019-12-06 NOTE — Telephone Encounter (Signed)
Called the patient and reviewed her lab results with her. Advised that her Labs looked good and she would like to keep her at the current dose of tecfidera. Lucile Crater would like to see the patient back around 6 mths from 4/22 office visit. Pt will call back to schedule.

## 2020-01-07 ENCOUNTER — Other Ambulatory Visit: Payer: Self-pay

## 2020-01-07 ENCOUNTER — Encounter (INDEPENDENT_AMBULATORY_CARE_PROVIDER_SITE_OTHER): Payer: Self-pay | Admitting: Otolaryngology

## 2020-01-07 ENCOUNTER — Ambulatory Visit (INDEPENDENT_AMBULATORY_CARE_PROVIDER_SITE_OTHER): Payer: Medicare Other | Admitting: Otolaryngology

## 2020-01-07 VITALS — Temp 97.5°F

## 2020-01-07 DIAGNOSIS — I2510 Atherosclerotic heart disease of native coronary artery without angina pectoris: Secondary | ICD-10-CM

## 2020-01-07 DIAGNOSIS — K145 Plicated tongue: Secondary | ICD-10-CM

## 2020-01-07 DIAGNOSIS — J310 Chronic rhinitis: Secondary | ICD-10-CM

## 2020-01-07 DIAGNOSIS — K140 Glossitis: Secondary | ICD-10-CM

## 2020-01-07 NOTE — Progress Notes (Signed)
HPI: Daisy Bennett is a 64 y.o. female who presents is referred by her PCP for evaluation of chronic tongue inflammation.  This initially began the first week of January when she developed Covid.  Because of the inflammatory changes of the tongue she has been treated with several antifungal medications including Diflucan and nystatin.  She also tried gargling with hydrogen peroxide but this caused a lot of discomfort.  She has been diagnosed with "Covid tongue". She also complains of nasal congestion and trouble breathing through her nose. She apparently lost hearing in the left ear about 25 years ago but has never had this evaluated..  Past Medical History:  Diagnosis Date  . Basal cell carcinoma   . Brain bleed ([place]) 2017  . COVID-19 08/2019  . DM (diabetes mellitus) ([place])   . Former smoker   . 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 Goiter, unspecified   . History of colonic polyps 2012  . Multiple sclerosis ([place])   . Non-ST elevated myocardial infarction (non-STEMI) ([place])   . Oral mucositis 08/31/2019  . SCCA (squamous cell carcinoma) of skin    right shin  . SEMI (subendocardial myocardial infarction) ([place]) 09/29/09   TOTAL RCA WITH STENT TO OM BRANCH  . Shingles   . Squamous cell carcinoma 2019   lower ext  . Unspecified essential hypertension   . Unspecified hypothyroidism    Past Surgical History:  Procedure Laterality Date  . CATARACT SURGERY  12/10  . CESAREAN SECTION    . CORONARY ANGIOPLASTY WITH STENT PLACEMENT  2011  . KNEE SURGERY    . leg surgery     squamous ccell removed   . TONSILLECTOMY    . VESICOVAGINAL FISTULA CLOSURE W/ TAH     Social History   Socioeconomic History  . Marital status: Married    Spouse name: Not on file  . Number of children: 2  . Years of education: 63  . Highest education level: Not on file  Occupational History  . Occupation: Therapist, music  Tobacco Use  . Smoking status: Former Smoker    Packs/day: 1.00     Years: 15.00    Pack years: 15.00    Quit date: 12/16/2015    Years since quitting: 4.0  . Smokeless tobacco: Never Used  . Tobacco comment: on and off smoker  Substance and Sexual Activity  . Alcohol use: Yes    Alcohol/week: 0.0 standard drinks    Comment: OCCASIONAL ALCOHOL USE.  . Drug use: No  . Sexual activity: Yes  Other Topics Concern  . Not on file  Social History Narrative   The patient lives with husband. 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    Patient drinks occasionally, does not chew tobacco, does not use recreational drugs. She does drink occasional caffeine. She does wear her seatbelt. She does wear bike helmet riding a bike. She does exercise 3 times a week. She does not wear hearing aids or dentures. There is a smoke alarm in her home. There are no firearms in her home. She feels safe in her relationship. She has never experienced physical abuse.   Patient sleeps 7-8 hours a night.   Patient drinks 2 cups of caffeine daily.   Patient is right handed.   Social Determinants of Health   Financial Resource Strain:   . Difficulty of Paying Living Expenses:   Food Insecurity:   . Worried About Charity fundraiser in the Last  Year:   . Ran Out of Food in the Last Year:   Transportation Needs:   . Film/video editor (Medical):   [name] Lack of Transportation (Non-Medical):   Physical Activity:   . Days of Exercise per Week:   . Minutes of Exercise per Session:   Stress:   . Feeling of Stress :   Social Connections:   . Frequency of Communication with Friends and Family:   . Frequency of Social Gatherings with Friends and Family:   . Attends Religious Services:   . Active Member of Clubs or Organizations:   . Attends Archivist Meetings:   [name] Marital Status:    Family History  Problem Relation Age of Onset  . Hypertension Mother   . Heart failure Father   . Diabetes Father   . Aneurysm Brother        THORACIC/ABD ANEURYSM   . Prostate cancer Brother 71  . Diabetes Brother   . Fibromyalgia Sister   . Brain cancer Maternal Grandmother 68  . Multiple sclerosis Neg Hx   . Colon cancer Neg Hx   . Esophageal cancer Neg Hx    Allergies  Allergen Reactions  . Betaseron [Interferon Beta-1b] Other (See Comments)    Increased LFT's  . Provigil [Modafinil] Swelling    Tongue swelling and sores  . Topamax [Topiramate]     Cognitive slowing  . Doxycycline Other (See Comments)    Thrush,dizziness   Prior to Admission medications   Medication Sig Start Date End Date Taking? Authorizing Provider  amitriptyline (ELAVIL) 100 MG tablet Take 1 tablet (100 mg total) by mouth daily. 11/23/19  Yes Kuneff, Renee A, DO  amLODipine (NORVASC) 10 MG tablet Take 1 tablet (10 mg total) by mouth daily. 11/26/19  Yes Josue Hector, MD  aspirin EC 81 MG tablet Take 1 tablet (81 mg total) daily by mouth. 06/09/17  Yes Josue Hector, MD  carvedilol (COREG) 6.25 MG tablet Take 1 tablet (6.25 mg total) by mouth 2 (two) times daily. 11/26/19  Yes Josue Hector, MD  cholecalciferol (VITAMIN D) 1000 units tablet Take 1,000 Units by mouth daily.   Yes [provider]  Dimethyl Fumarate (TECFIDERA) 120 MG CPDR Take 120 mg by mouth 2 (two) times daily. 11/19/19  Yes Kathrynn Ducking, MD  esomeprazole (NEXIUM) 20 MG capsule Take 1 capsule (20 mg total) by mouth daily at 12 noon. 11/23/19  Yes Kuneff, Renee A, DO  fenofibrate 160 MG tablet Take 1 tablet (160 mg total) by mouth daily. OFFICE VISIT NEEDED 10/24/19  Yes Kuneff, Renee A, DO  Ferrous Sulfate (IRON) 325 (65 FE) MG TABS Take 325 mg by mouth daily.    Yes [provider]  fish oil-omega-3 fatty acids 1000 MG capsule Take 1 g by mouth daily.    Yes [provider]  gabapentin (NEURONTIN) 600 MG tablet TAKE 1 TABLET BY MOUTH TWICE A DAY 05/01/19  Yes Kathrynn Ducking, MD  glimepiride (AMARYL) 2 MG tablet 2 tab in the morning and  1/2 tab in the evening. 11/23/19   Yes Kuneff, Renee A, DO  hydrochlorothiazide (HYDRODIURIL) 50 MG tablet Take 1 tablet (50 mg total) by mouth daily. 11/23/19  Yes Kuneff, Renee A, DO  levothyroxine (SYNTHROID) 112 MCG tablet Take 1 tablet (112 mcg total) by mouth daily. 11/23/19  Yes Kuneff, Renee A, DO  losartan (COZAAR) 100 MG tablet Take 1 tablet (100 mg total) by mouth daily. 07/09/19  Yes  Josue Hector, MD  metFORMIN (GLUCOPHAGE) 1000 MG tablet Take 1 tablet (1,000 mg total) by mouth daily with breakfast. 11/26/19  Yes Kuneff, Renee A, DO  nitroGLYCERIN (NITROSTAT) 0.4 MG SL tablet Place 1 tablet (0.4 mg total) under the tongue every 5 (five) minutes x 3 doses as needed for chest pain. 12/28/18 08/28/20 Yes Josue Hector, MD  potassium chloride (KLOR-CON) 10 MEQ tablet Take 1 tablet (10 mEq total) by mouth daily. 11/23/19  Yes Kuneff, Renee A, DO  prasugrel (EFFIENT) 10 MG TABS tablet TAKE 1 TABLET BY MOUTH EVERY DAY 10/02/19  Yes Josue Hector, MD  rosuvastatin (CRESTOR) 10 MG tablet TAKE 1 TABLET BY MOUTH DAILY. PLEASE MAKE YEARLY APPT WITH DR. Johnsie Cancel FOR OCTOBER FOR FUTURE REFILLS 10/30/19  Yes Josue Hector, MD  venlafaxine XR (EFFEXOR XR) 75 MG 24 hr capsule Take 3 capsules (225 mg total) by mouth daily with breakfast. 11/23/19  Yes Kuneff, Renee A, DO     Positive ROS: Otherwise negative  All other systems have been reviewed and were otherwise negative with the exception of those mentioned in the HPI and as above.  Physical Exam: Constitutional: Alert, well-appearing, no acute distress Ears: External ears without lesions or tenderness. Ear canals are clear bilaterally with intact, clear TMs bilaterally.  On hearing screening with a tuning forks she has marked decreased hearing in the left ear compared to the right with AC > BC bilaterally. Nasal: External nose without lesions. Septum slightly deviated to the left with moderate rhinitis and clear mucus discharge.  Both middle meatus regions are clear with no signs of  acute infection..  Oral: Lips and gums without lesions.  Tongue is slightly erythematous and has a few fissures but no gross ulcerations.  On palpation of the tongue it is soft.  The buccal mucosa palate mucosa and oropharyngeal mucosa was clear with no evidence of thrush or fungus.  Indirect laryngoscopy reveals a clear base of tongue vallecula and epiglottis.  Hypopharynx is clear.  Vocal cords are clear bilaterally. Neck: No palpable adenopathy or masses Respiratory: Breathing comfortably  Skin: No facial/neck lesions or rash noted.  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  On clinical exam no obvious evidence of Candida or thrush. Mild rhinitis with mild nasal congestion. Left ear SNHL.  Plan: Prescribed Nasacort 2 sprays each nostril at night as this should help with nasal congestion and provide better nasal breathing. For the mouth would recommend normal oral hygiene and perhaps gargling with Listerine or scope. Suggested also use of probiotic such as Lactinex 2 to 3 capsules 3 times daily which helps with some soreness in the mouth. Suggested eating yogurt daily which is also a probiotic for helping to restore normal bacterial flora. Suggested taking Cepacol lozenges or spray for sore throat as needed. Recommended follow-up in 1 month for recheck and obtain audiogram.   Radene Journey, MD   CC:

## 2020-02-13 ENCOUNTER — Encounter (INDEPENDENT_AMBULATORY_CARE_PROVIDER_SITE_OTHER): Payer: Self-pay | Admitting: Otolaryngology

## 2020-02-13 ENCOUNTER — Ambulatory Visit (INDEPENDENT_AMBULATORY_CARE_PROVIDER_SITE_OTHER): Payer: Medicare Other | Admitting: Otolaryngology

## 2020-02-13 ENCOUNTER — Other Ambulatory Visit: Payer: Self-pay

## 2020-02-13 VITALS — Temp 97.3°F

## 2020-02-13 DIAGNOSIS — I2510 Atherosclerotic heart disease of native coronary artery without angina pectoris: Secondary | ICD-10-CM | POA: Diagnosis not present

## 2020-02-13 DIAGNOSIS — H9042 Sensorineural hearing loss, unilateral, left ear, with unrestricted hearing on the contralateral side: Secondary | ICD-10-CM

## 2020-02-13 NOTE — Progress Notes (Signed)
HPI: Daisy Bennett is a 64 y.o. female who returns today for evaluation of tongue and left ear hearing loss.  She apparently developed left ear hearing loss about 25 years ago and was recommended hearing aid but cannot afford at that time and never got 1. The tongue is feeling better although recently she had a sore underneath the tongue on the ventral surface of the tongue on the left side that improved after a week but she felt left a little mark or hole..  Past Medical History:  Diagnosis Date  . Basal cell carcinoma   . Brain bleed ([place]) 2017  . COVID-19 08/2019  . DM (diabetes mellitus) ([place])   . Former smoker   . 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 Goiter, unspecified   . History of colonic polyps 2012  . Multiple sclerosis ([place])   . Non-ST elevated myocardial infarction (non-STEMI) ([place])   . Oral mucositis 08/31/2019  . SCCA (squamous cell carcinoma) of skin    right shin  . SEMI (subendocardial myocardial infarction) ([place]) 09/29/09   TOTAL RCA WITH STENT TO OM BRANCH  . Shingles   . Squamous cell carcinoma 2019   lower ext  . Unspecified essential hypertension   . Unspecified hypothyroidism    Past Surgical History:  Procedure Laterality Date  . CATARACT SURGERY  12/10  . CESAREAN SECTION    . CORONARY ANGIOPLASTY WITH STENT PLACEMENT  2011  . KNEE SURGERY    . leg surgery     squamous ccell removed   . TONSILLECTOMY    . VESICOVAGINAL FISTULA CLOSURE W/ TAH     Social History   Socioeconomic History  . Marital status: Married    Spouse name: Not on file  . Number of children: 2  . Years of education: 64  . Highest education level: Not on file  Occupational History  . Occupation: Therapist, music  Tobacco Use  . Smoking status: Former Smoker    Packs/day: 1.00    Years: 15.00    Pack years: 15.00    Quit date: 12/16/2015    Years since quitting: 4.1  . Smokeless tobacco: Never Used  . Tobacco comment: on and off smoker  Vaping Use  .  Vaping Use: Never used  Substance and Sexual Activity  . Alcohol use: Yes    Alcohol/week: 0.0 standard drinks    Comment: OCCASIONAL ALCOHOL USE.  . Drug use: No  . Sexual activity: Yes  Other Topics Concern  . Not on file  Social History Narrative   The patient lives with husband. 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    Patient drinks occasionally, does not chew tobacco, does not use recreational drugs. She does drink occasional caffeine. She does wear her seatbelt. She does wear bike helmet riding a bike. She does exercise 3 times a week. She does not wear hearing aids or dentures. There is a smoke alarm in her home. There are no firearms in her home. She feels safe in her relationship. She has never experienced physical abuse.   Patient sleeps 7-8 hours a night.   Patient drinks 2 cups of caffeine daily.   Patient is right handed.   Social Determinants of Health   Financial Resource Strain:   . Difficulty of Paying Living Expenses:   Food Insecurity:   . Worried About Charity fundraiser in the Last Year:   . [place] in the Last Year:  Transportation Needs:   . Film/video editor (Medical):   [name] Lack of Transportation (Non-Medical):   Physical Activity:   . Days of Exercise per Week:   . Minutes of Exercise per Session:   Stress:   . Feeling of Stress :   Social Connections:   . Frequency of Communication with Friends and Family:   . Frequency of Social Gatherings with Friends and Family:   . Attends Religious Services:   . Active Member of Clubs or Organizations:   . Attends Archivist Meetings:   [name] Marital Status:    Family History  Problem Relation Age of Onset  . Hypertension Mother   . Heart failure Father   . Diabetes Father   . Aneurysm Brother        THORACIC/ABD ANEURYSM  . Prostate cancer Brother 53  . Diabetes Brother   . Fibromyalgia Sister   . Brain cancer Maternal Grandmother 82  . Multiple sclerosis Neg  Hx   . Colon cancer Neg Hx   . Esophageal cancer Neg Hx    Allergies  Allergen Reactions  . Betaseron [Interferon Beta-1b] Other (See Comments)    Increased LFT's  . Provigil [Modafinil] Swelling    Tongue swelling and sores  . Topamax [Topiramate]     Cognitive slowing  . Doxycycline Other (See Comments)    Thrush,dizziness   Prior to Admission medications   Medication Sig Start Date End Date Taking? Authorizing Provider  amitriptyline (ELAVIL) 100 MG tablet Take 1 tablet (100 mg total) by mouth daily. 11/23/19  Yes Kuneff, Renee A, DO  amLODipine (NORVASC) 10 MG tablet Take 1 tablet (10 mg total) by mouth daily. 11/26/19  Yes Josue Hector, MD  aspirin EC 81 MG tablet Take 1 tablet (81 mg total) daily by mouth. 06/09/17  Yes Josue Hector, MD  carvedilol (COREG) 6.25 MG tablet Take 1 tablet (6.25 mg total) by mouth 2 (two) times daily. 11/26/19  Yes Josue Hector, MD  cholecalciferol (VITAMIN D) 1000 units tablet Take 1,000 Units by mouth daily.   Yes [provider]  Dimethyl Fumarate (TECFIDERA) 120 MG CPDR Take 120 mg by mouth 2 (two) times daily. 11/19/19  Yes Kathrynn Ducking, MD  esomeprazole (NEXIUM) 20 MG capsule Take 1 capsule (20 mg total) by mouth daily at 12 noon. 11/23/19  Yes Kuneff, Renee A, DO  fenofibrate 160 MG tablet Take 1 tablet (160 mg total) by mouth daily. OFFICE VISIT NEEDED 10/24/19  Yes Kuneff, Renee A, DO  Ferrous Sulfate (IRON) 325 (65 FE) MG TABS Take 325 mg by mouth daily.    Yes [provider]  fish oil-omega-3 fatty acids 1000 MG capsule Take 1 g by mouth daily.    Yes [provider]  gabapentin (NEURONTIN) 600 MG tablet TAKE 1 TABLET BY MOUTH TWICE A DAY 05/01/19  Yes Kathrynn Ducking, MD  glimepiride (AMARYL) 2 MG tablet 2 tab in the morning and  1/2 tab in the evening. 11/23/19  Yes Kuneff, Renee A, DO  hydrochlorothiazide (HYDRODIURIL) 50 MG tablet Take 1 tablet (50 mg total) by mouth daily. 11/23/19  Yes Kuneff, Renee A,  DO  levothyroxine (SYNTHROID) 112 MCG tablet Take 1 tablet (112 mcg total) by mouth daily. 11/23/19  Yes Kuneff, Renee A, DO  losartan (COZAAR) 100 MG tablet Take 1 tablet (100 mg total) by mouth daily. 07/09/19  Yes Josue Hector, MD  metFORMIN (GLUCOPHAGE) 1000 MG tablet Take 1 tablet (1,000  mg total) by mouth daily with breakfast. 11/26/19  Yes Kuneff, Renee A, DO  nitroGLYCERIN (NITROSTAT) 0.4 MG SL tablet Place 1 tablet (0.4 mg total) under the tongue every 5 (five) minutes x 3 doses as needed for chest pain. 12/28/18 08/28/20 Yes Josue Hector, MD  potassium chloride (KLOR-CON) 10 MEQ tablet Take 1 tablet (10 mEq total) by mouth daily. 11/23/19  Yes Kuneff, Renee A, DO  prasugrel (EFFIENT) 10 MG TABS tablet TAKE 1 TABLET BY MOUTH EVERY DAY 10/02/19  Yes Josue Hector, MD  rosuvastatin (CRESTOR) 10 MG tablet TAKE 1 TABLET BY MOUTH DAILY. PLEASE MAKE YEARLY APPT WITH DR. Johnsie Cancel FOR OCTOBER FOR FUTURE REFILLS 10/30/19  Yes Josue Hector, MD  venlafaxine XR (EFFEXOR XR) 75 MG 24 hr capsule Take 3 capsules (225 mg total) by mouth daily with breakfast. 11/23/19  Yes Kuneff, Renee A, DO     Positive ROS: Otherwise negative  All other systems have been reviewed and were otherwise negative with the exception of those mentioned in the HPI and as above.  Physical Exam: Constitutional: Alert, well-appearing, no acute distress Ears: External ears without lesions or tenderness. Ear canals are clear bilaterally with intact, clear TMs.  Nasal: External nose without lesions. Clear nasal passages Oral: Lips and gums without lesions. Tongue and palate mucosa without lesions. Posterior oropharynx clear.  Oral mucous membranes were clear with no lesions noted.  The area of the sore on the left ventral surface of the tongue is normal in appearance is soft in appearance.  There is no mucosal abnormalities noted.  Probably result of a aphthous ulcer. Neck: No palpable adenopathy or masses Respiratory: Breathing  comfortably  Skin: No facial/neck lesions or rash noted.  Audiogram today demonstrated normal hearing in the right ear with a moderate severe hearing loss in the left ear of approximately 50-60 dB in the upper frequencies.  Type A tympanograms bilaterally.  Procedures  Assessment: Tongue and oral mucosal doing well History of left ear sudden sensorineural hearing loss moderate to severe.  Plan: Recommend use of Zilactin if she develops any sores in her mouth.  Also recommended use of a hearing aid in the left ear She will follow-up as needed  Radene Journey, MD

## 2020-02-21 ENCOUNTER — Emergency Department (HOSPITAL_BASED_OUTPATIENT_CLINIC_OR_DEPARTMENT_OTHER): Payer: Medicare Other

## 2020-02-21 ENCOUNTER — Other Ambulatory Visit: Payer: Self-pay

## 2020-02-21 ENCOUNTER — Emergency Department (HOSPITAL_BASED_OUTPATIENT_CLINIC_OR_DEPARTMENT_OTHER)
Admission: EM | Admit: 2020-02-21 | Discharge: 2020-02-21 | Disposition: A | Discharge status: Home / Self Care | Payer: Medicare Other | Attending: Emergency Medicine | Admitting: Emergency Medicine

## 2020-02-21 ENCOUNTER — Encounter (HOSPITAL_BASED_OUTPATIENT_CLINIC_OR_DEPARTMENT_OTHER): Payer: Self-pay

## 2020-02-21 ENCOUNTER — Telehealth: Payer: Self-pay

## 2020-02-21 DIAGNOSIS — Z87891 Personal history of nicotine dependence: Secondary | ICD-10-CM | POA: Diagnosis not present

## 2020-02-21 DIAGNOSIS — S63615A Unspecified sprain of left ring finger, initial encounter: Secondary | ICD-10-CM | POA: Insufficient documentation

## 2020-02-21 DIAGNOSIS — R930 Abnormal findings on diagnostic imaging of skull and head, not elsewhere classified: Secondary | ICD-10-CM | POA: Diagnosis not present

## 2020-02-21 DIAGNOSIS — Z7982 Long term (current) use of aspirin: Secondary | ICD-10-CM | POA: Diagnosis not present

## 2020-02-21 DIAGNOSIS — Y999 Unspecified external cause status: Secondary | ICD-10-CM | POA: Insufficient documentation

## 2020-02-21 DIAGNOSIS — W010XXA Fall on same level from slipping, tripping and stumbling without subsequent striking against object, initial encounter: Secondary | ICD-10-CM | POA: Diagnosis not present

## 2020-02-21 DIAGNOSIS — Y929 Unspecified place or not applicable: Secondary | ICD-10-CM | POA: Diagnosis not present

## 2020-02-21 DIAGNOSIS — S20219A Contusion of unspecified front wall of thorax, initial encounter: Secondary | ICD-10-CM | POA: Diagnosis not present

## 2020-02-21 DIAGNOSIS — Y939 Activity, unspecified: Secondary | ICD-10-CM | POA: Diagnosis not present

## 2020-02-21 DIAGNOSIS — Z79899 Other long term (current) drug therapy: Secondary | ICD-10-CM | POA: Insufficient documentation

## 2020-02-21 DIAGNOSIS — E1122 Type 2 diabetes mellitus with diabetic chronic kidney disease: Secondary | ICD-10-CM | POA: Insufficient documentation

## 2020-02-21 DIAGNOSIS — E039 Hypothyroidism, unspecified: Secondary | ICD-10-CM | POA: Insufficient documentation

## 2020-02-21 DIAGNOSIS — S63501A Unspecified sprain of right wrist, initial encounter: Secondary | ICD-10-CM | POA: Insufficient documentation

## 2020-02-21 DIAGNOSIS — Z7984 Long term (current) use of oral hypoglycemic drugs: Secondary | ICD-10-CM | POA: Diagnosis not present

## 2020-02-21 DIAGNOSIS — N189 Chronic kidney disease, unspecified: Secondary | ICD-10-CM | POA: Diagnosis not present

## 2020-02-21 DIAGNOSIS — S6991XA Unspecified injury of right wrist, hand and finger(s), initial encounter: Secondary | ICD-10-CM | POA: Diagnosis present

## 2020-02-21 DIAGNOSIS — W19XXXA Unspecified fall, initial encounter: Secondary | ICD-10-CM

## 2020-02-21 DIAGNOSIS — I129 Hypertensive chronic kidney disease with stage 1 through stage 4 chronic kidney disease, or unspecified chronic kidney disease: Secondary | ICD-10-CM | POA: Diagnosis not present

## 2020-02-21 MED ORDER — HYDROCODONE-ACETAMINOPHEN 5-325 MG PO TABS: 1.0000 | ORAL_TABLET | Freq: Four times a day (QID) | ORAL | 0 refills | Status: DC | PRN

## 2020-02-21 NOTE — ED Provider Notes (Signed)
[facility] EMERGENCY DEPARTMENT Provider Note   CSN: [id number] Arrival date & time: 02/21/20  1023     History Chief Complaint  Patient presents with  . Fall    Daisy Bennett is a 64 y.o. female who presents for evaluation of anterior chest wall soreness, right wrist pain, left fourth digit pain after mechanical fall that occurred yesterday.  Patient reports she was at the dentist office and states that she tripped over receptacle that was on the floor.  She went forward, landing squarely on her chest.  She states that she hit her chin but does not think that she hit her head.  Did not have any LOC.  She sat there for a minute but then was able to get up.  She thinks that she also hit her hand and her wrist while she fell.  She reports last night, she started having pain, swelling noted to the left fourth digit.  She had go to the fire department to get her ring cut off.  She also started experiencing some pain in the right wrist.  She reports that since then, she has had continued pain to the anterior chest wall.  This is worse when she tries to move and bend.  She has been taking over-the-counter Tylenol with minimal improvement.  She has not had any nausea/vomiting, numbness/weakness of arms or legs.  She states that when she takes a deep breath it hurts anterior aspect of her chest but has not any difficulty breathing.  She is on Effient which she has been compliant with.  She does report that she has had history of cerebral hemorrhage before.   The history is provided by the patient.       Past Medical History:  Diagnosis Date  . Basal cell carcinoma   . Brain bleed ([place]) 2017  . COVID-19 08/2019  . DM (diabetes mellitus) ([place])   . Former smoker   . 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 Goiter, unspecified   . History of colonic polyps 2012  . Multiple sclerosis ([place])   . Non-ST elevated myocardial infarction (non-STEMI) ([place])   . Oral mucositis 08/31/2019  .  SCCA (squamous cell carcinoma) of skin    right shin  . SEMI (subendocardial myocardial infarction) ([place]) 09/29/09   TOTAL RCA WITH STENT TO OM BRANCH  . Shingles   . Squamous cell carcinoma 2019   lower ext  . Unspecified essential hypertension   . Unspecified hypothyroidism     Patient Active Problem List   Diagnosis Date Noted  . Dry mouth 11/23/2019  . Oral mucositis 08/31/2019  . Chronic kidney disease (CKD), stage III (moderate) 07/05/2019  . 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 Olecranon bursitis of left elbow 10/17/2017  . Memory deficit 06/17/2017  . Controlled type 2 diabetes mellitus without complication, without long-term current use of insulin ([place]) 02/14/2017  . Antiplatelet or antithrombotic long-term use 05/18/2016  . Subdural hematoma ([place]) 05/18/2016  . Morbid obesity ([place]) 05/12/2016  . History of colon polyps 12/15/2015  . Tobacco abuse counseling 03/20/2015  . Mixed hyperlipidemia 10/23/2009  . Coronary atherosclerosis 10/23/2009  . CARDIAC MURMUR 10/23/2009  . Hypothyroidism 10/15/2009  . Multiple sclerosis ([place]) 10/15/2009  . Essential hypertension 10/15/2009    Past Surgical History:  Procedure Laterality Date  . CATARACT SURGERY  12/10  . CESAREAN SECTION    . CORONARY ANGIOPLASTY WITH STENT PLACEMENT  2011  . KNEE SURGERY    .  leg surgery     squamous ccell removed   . TONSILLECTOMY    . VESICOVAGINAL FISTULA CLOSURE W/ TAH       OB History   No obstetric history on file.     Family History  Problem Relation Age of Onset  . Hypertension Mother   . Heart failure Father   . Diabetes Father   . Aneurysm Brother        THORACIC/ABD ANEURYSM  . Prostate cancer Brother 82  . Diabetes Brother   . Fibromyalgia Sister   . Brain cancer Maternal Grandmother 72  . Multiple sclerosis Neg Hx   . Colon cancer Neg Hx   . Esophageal cancer Neg Hx     Social History   Tobacco Use  . Smoking status:  Former Smoker    Packs/day: 1.00    Years: 15.00    Pack years: 15.00    Quit date: 12/16/2015    Years since quitting: 4.1  . Smokeless tobacco: Never Used  . Tobacco comment: on and off smoker  Vaping Use  . Vaping Use: Never used  Substance Use Topics  . Alcohol use: Yes    Alcohol/week: 0.0 standard drinks    Comment: OCCASIONAL ALCOHOL USE.  . Drug use: No    Home Medications Prior to Admission medications   Medication Sig Start Date End Date Taking? Authorizing Provider  amitriptyline (ELAVIL) 100 MG tablet Take 1 tablet (100 mg total) by mouth daily. 11/23/19   Kuneff, Renee A, DO  amLODipine (NORVASC) 10 MG tablet Take 1 tablet (10 mg total) by mouth daily. 11/26/19   Josue Hector, MD  aspirin EC 81 MG tablet Take 1 tablet (81 mg total) daily by mouth. 06/09/17   Josue Hector, MD  carvedilol (COREG) 6.25 MG tablet Take 1 tablet (6.25 mg total) by mouth 2 (two) times daily. 11/26/19   Josue Hector, MD  cholecalciferol (VITAMIN D) 1000 units tablet Take 1,000 Units by mouth daily.    [provider]  Dimethyl Fumarate (TECFIDERA) 120 MG CPDR Take 120 mg by mouth 2 (two) times daily. 11/19/19   Kathrynn Ducking, MD  esomeprazole (NEXIUM) 20 MG capsule Take 1 capsule (20 mg total) by mouth daily at 12 noon. 11/23/19   Kuneff, Renee A, DO  fenofibrate 160 MG tablet Take 1 tablet (160 mg total) by mouth daily. OFFICE VISIT NEEDED 10/24/19   Raoul Pitch, Renee A, DO  Ferrous Sulfate (IRON) 325 (65 FE) MG TABS Take 325 mg by mouth daily.     [provider]  fish oil-omega-3 fatty acids 1000 MG capsule Take 1 g by mouth daily.     [provider]  gabapentin (NEURONTIN) 600 MG tablet TAKE 1 TABLET BY MOUTH TWICE A DAY 05/01/19   Kathrynn Ducking, MD  glimepiride (AMARYL) 2 MG tablet 2 tab in the morning and  1/2 tab in the evening. 11/23/19   Kuneff, Renee A, DO  hydrochlorothiazide (HYDRODIURIL) 50 MG tablet Take 1 tablet (50 mg total) by mouth daily. 11/23/19    Kuneff, Renee A, DO  HYDROcodone-acetaminophen (NORCO/VICODIN) 5-325 MG tablet Take 1-2 tablets by mouth every 6 (six) hours as needed. 02/21/20   Volanda Napoleon, PA-C  levothyroxine (SYNTHROID) 112 MCG tablet Take 1 tablet (112 mcg total) by mouth daily. 11/23/19   Kuneff, Renee A, DO  losartan (COZAAR) 100 MG tablet Take 1 tablet (100 mg total) by mouth daily. 07/09/19   Josue Hector, MD  metFORMIN (GLUCOPHAGE) 1000 MG tablet Take 1 tablet (1,000 mg total) by mouth daily with breakfast. 11/26/19   Kuneff, Renee A, DO  nitroGLYCERIN (NITROSTAT) 0.4 MG SL tablet Place 1 tablet (0.4 mg total) under the tongue every 5 (five) minutes x 3 doses as needed for chest pain. 12/28/18 08/28/20  Josue Hector, MD  potassium chloride (KLOR-CON) 10 MEQ tablet Take 1 tablet (10 mEq total) by mouth daily. 11/23/19   Kuneff, Renee A, DO  prasugrel (EFFIENT) 10 MG TABS tablet TAKE 1 TABLET BY MOUTH EVERY DAY 10/02/19   Josue Hector, MD  rosuvastatin (CRESTOR) 10 MG tablet TAKE 1 TABLET BY MOUTH DAILY. PLEASE MAKE YEARLY APPT WITH DR. Johnsie Cancel FOR OCTOBER FOR FUTURE REFILLS 10/30/19   Josue Hector, MD  venlafaxine XR (EFFEXOR XR) 75 MG 24 hr capsule Take 3 capsules (225 mg total) by mouth daily with breakfast. 11/23/19   Kuneff, Renee A, DO    Allergies    Betaseron [interferon beta-1b], Provigil [modafinil], Topamax [topiramate], and Doxycycline  Review of Systems   Review of Systems  Constitutional: Negative for fever.  Respiratory: Negative for cough and shortness of breath.   Cardiovascular: Negative for chest pain.  Gastrointestinal: Negative for abdominal pain, nausea and vomiting.  Genitourinary: Negative for dysuria and hematuria.  Musculoskeletal:       Chest wall soreness Left 4th digit pain Right wrist pain  Neurological: Negative for weakness, numbness and headaches.  All other systems reviewed and are negative.   Physical Exam Updated Vital Signs BP (!) 135/77   Pulse 72   Temp 98.3 F  (36.8 C) (Oral)   Resp 16   Ht 5\' 6"  (1.676 m)   Wt 83.9 kg   SpO2 99%   BMI 29.86 kg/m   Physical Exam Vitals and nursing note reviewed.  Constitutional:      Appearance: Normal appearance. She is well-developed.  HENT:     Head: Normocephalic and atraumatic.      Comments: No tenderness to palpation of skull. No deformities or crepitus noted. No open wounds, abrasions or lacerations.  No tenderness palpation noted to mandible/maxilla.  No tenderness noted over the zygomatic arches bilaterally. Eyes:     General: Lids are normal.     Conjunctiva/sclera: Conjunctivae normal.     Pupils: Pupils are equal, round, and reactive to light.     Comments: PERRL. EOMs intact. No nystagmus. No neglect.   Neck:     Comments: Full flexion/extension and lateral movement of neck fully intact. No bony midline tenderness. No deformities or crepitus.  Cardiovascular:     Rate and Rhythm: Normal rate and regular rhythm.     Pulses: Normal pulses.     Heart sounds: Normal heart sounds. No murmur heard.  No friction rub. No gallop.   Pulmonary:     Effort: Pulmonary effort is normal.     Breath sounds: Normal breath sounds.     Comments: Lungs clear to auscultation bilaterally.  Symmetric chest rise.  No wheezing, rales, rhonchi. Chest:       Comments: Tenderness to palpation noted anterior chest wall with overlying ecchymosis.  No deformity or crepitus noted. Abdominal:     Palpations: Abdomen is soft. Abdomen is not rigid.     Tenderness: There is no abdominal tenderness. There is no guarding.  Musculoskeletal:        General: Normal range of motion.     Cervical back: Full passive range of motion without pain.  Comments: No midline T or L-spine tenderness.  Tenderness palpation noted to the left fourth digit with overlying ecchymosis, swelling.  Compartments are soft.  Flexion/tension intact but does report pain with doing so.  No bony tenderness noted to the wrist, hand, forearm elbow  and shoulder of the left upper extremity.  Tenderness palpation noted to right wrist.  No snuffbox tenderness.  Flexion/tension of all 5 digits of right hand intact any difficulty.  No bony tenderness noted to the right forearm, right elbow, right shoulder.  No pelvic instability.  No bony tenderness noted to bilateral lower extremities.  Skin:    General: Skin is warm and dry.     Capillary Refill: Capillary refill takes less than 2 seconds.     Comments: Scattered ecchymosis noted to the anterior chest wall.  She also has ecchymosis noted to the chin. Good distal cap refill.  LUE is not dusky in appearance or cool to touch.  Neurological:     Mental Status: She is alert and oriented to person, place, and time.     Comments: Cranial nerves III-XII intact Follows commands, Moves all extremities  5/5 strength to BUE and BLE  Sensation intact throughout all major nerve distributions Normal finger to nose. No slurred speech. No facial droop.   Psychiatric:        Speech: Speech normal.     ED Results / Procedures / Treatments   Labs (all labs ordered are listed, but only abnormal results are displayed) Labs Reviewed - No data to display  EKG None  Radiology DG Chest 2 View  Result Date: 02/21/2020 CLINICAL DATA:  Fall yesterday with chest pain, initial encounter EXAM: CHEST - 2 VIEW COMPARISON:  09/29/2009 FINDINGS: Cardiac shadow is within normal limits. Lungs are well aerated bilaterally. Minimal platelike atelectasis in the left base is seen. No acute bony abnormality is noted. IMPRESSION: Minimal left platelike atelectasis. Electronically Signed   By: Inez Catalina M.D.   On: 02/21/2020 13:14   DG Wrist Complete Right  Result Date: 02/21/2020 CLINICAL DATA:  Recent fall with wrist pain, initial encounter EXAM: RIGHT WRIST - COMPLETE 3+ VIEW COMPARISON:  None. FINDINGS: There is no evidence of fracture or dislocation. There is no evidence of arthropathy or other focal bone  abnormality. Soft tissues are unremarkable. IMPRESSION: No acute abnormality noted. Electronically Signed   By: Inez Catalina M.D.   On: 02/21/2020 13:15   CT Head Wo Contrast  Result Date: 02/21/2020 CLINICAL DATA:  Recent fall with headaches and facial pain, initial encounter EXAM: CT HEAD WITHOUT CONTRAST TECHNIQUE: Contiguous axial images were obtained from the base of the skull through the vertex without intravenous contrast. COMPARISON:  07/06/2017 FINDINGS: Brain: No evidence of acute infarction, hemorrhage, hydrocephalus, extra-axial collection or mass lesion/mass effect. Vascular: No hyperdense vessel or unexpected calcification. Skull: Normal. Negative for fracture or focal lesion. Sinuses/Orbits: No acute finding. Other: None. IMPRESSION: No acute intracranial abnormality noted. Electronically Signed   By: Inez Catalina M.D.   On: 02/21/2020 13:17   DG Hand Complete Left  Result Date: 02/21/2020 CLINICAL DATA:  [place] yesterday a dentist's office, LEFT ring finger bruising and deformity EXAM: LEFT HAND - COMPLETE 3+ VIEW COMPARISON:  None FINDINGS: Osseous demineralization. Scattered narrowing and minimal spurring of IP joints. Soft tissue swelling ring finger the level of the distal aspect of the proximal phalanx. No acute fracture, dislocation, or bone destruction. IMPRESSION: No acute osseous abnormalities. Electronically Signed   By: Crist Infante.D.  On: 02/21/2020 14:05    Procedures Procedures (including critical care time)  Medications Ordered in ED Medications - No data to display  ED Course  I have reviewed the triage vital signs and the nursing notes.  Pertinent labs & imaging results that were available during my care of the patient were reviewed by me and considered in my medical decision making (see chart for details).    MDM Rules/Calculators/A&P                          64 year old female who presents for evaluation after mechanical fall that occurred yesterday.  She  was at the dentist office and states that she tripped over something, causing her to fall forward.  She states that most the impact was on her chest.  She hit her chin but does not recall hitting her head.  She is on Effient.  No LOC.  She does have history of hemorrhage in her brain prior.  She is complaining of pain to the anterior chest wall as well as her left fourth digit as well as her right wrist.  On initial arrival, she is afebrile, nontoxic-appearing.  Vital signs are stable.  On exam, she has no neurological deficits.  Patient with tenderness palpation of the anterior chest as well as left fourth digit and wrist.  Concern for bruise versus contusion versus fracture.  Plan for imaging.  We discussed at length regarding CT head.  Patient does have history of hemorrhage in her brain and was told by her primary care doctor to be cautious whenever she falls and get evaluated.  After extensive discussion, we engaged in shared decision-making and patient and I agreed to perform head CT to rule out any intracranial hemorrhage.  Head CT negative for any acute abnormality.  Wrist x-ray negative for any acute bony normality.  Hand x-ray negative for any acute bony abnormality.  Chest x-ray negative for any acute bony abnormality.  Discussed results with patient.  Discussed with her that there could still be a ligamentous injury to the left fourth digit, especially given the amount of ecchymosis that she has.  At this time, her compartments are soft, she has good distal cap refill.  It is not concerning for infectious etiology.  We will plan to put her in a splint and have her follow-up with outpatient orthopedic surgeon.  Additionally, put her wrist in a brace.  Will give short course of pain medication for acute pain. At this time, patient exhibits no emergent life-threatening condition that require further evaluation in ED. Patient had ample opportunity for questions and discussion. All patient's questions  were answered with full understanding. Strict return precautions discussed. Patient expresses understanding and agreement to plan.   Portions of this note were generated with [employer]. Dictation errors may occur despite best attempts at proofreading.  Final Clinical Impression(s) / ED Diagnoses Final diagnoses:  Fall, initial encounter  Sprain of right wrist, initial encounter  Sprain of left ring finger, unspecified site of digit, initial encounter  Contusion of chest wall, unspecified laterality, initial encounter    Rx / DC Orders ED Discharge Orders         Ordered    HYDROcodone-acetaminophen (NORCO/VICODIN) 5-325 MG tablet  Every 6 hours PRN     Discontinue  Reprint     02/21/20 1439           Volanda Napoleon, PA-C 02/21/20 [place],  Jeannie Done, MD 02/27/20 1358

## 2020-02-21 NOTE — Discharge Instructions (Signed)
As we discussed, you can wear the brace in the finger splint for support and stabilization.  You can take 1000 mg of Tylenol.  Do not exceed 4000 mg of Tylenol a day.  Take pain medications as directed for break through pain. Do not drive or operate machinery while taking this medication.   As we discussed, you can initially do ice to help with pain and swelling.  After 48 hours, he can transition to hot compresses to help with pain.  Make sure you are doing so stretches to help with pain.  If finger gets worse or does not improve in several weeks, you can follow-up with referred hand doctor.  Return the emergency department for any difficulty breathing, vomiting or any other worsening concerning symptoms.

## 2020-02-21 NOTE — ED Triage Notes (Signed)
Pt arrives with c/o fall yesterday, states she tripped over a receptacle in the floor at the [facility] office. Pt is on a blood thinner, has bruising to chest and shoulders, left ring finger with deformity, and right wrist.

## 2020-02-21 NOTE — Telephone Encounter (Signed)
FYI- Patient called and stated that she fell yesterday.  Stated that she had to go the fire dept to have her ring cut off her finger because it was so swollen.  She knows she will need xrays and probably ultrasounds and MRIs because of the fall and her legs and arms are swollen and bruised.  I told her Dr. Raoul Pitch had no openings today and that she needed to go somewhere that access to xray. I could not find any openings at another [facility] location near the patient's home. Patient has decided to go to [facility]. She will call office and follow up with Dr. Raoul Pitch after visit at ED.

## 2020-02-21 NOTE — Telephone Encounter (Signed)
FYI

## 2020-02-21 NOTE — Telephone Encounter (Signed)
Noted. Thanks.

## 2020-02-26 ENCOUNTER — Encounter (INDEPENDENT_AMBULATORY_CARE_PROVIDER_SITE_OTHER): Payer: Self-pay

## 2020-02-29 ENCOUNTER — Ambulatory Visit: Payer: Medicare Other | Admitting: Family Medicine

## 2020-03-04 ENCOUNTER — Ambulatory Visit (INDEPENDENT_AMBULATORY_CARE_PROVIDER_SITE_OTHER): Payer: Medicare Other | Admitting: Family Medicine

## 2020-03-04 ENCOUNTER — Other Ambulatory Visit: Payer: Self-pay

## 2020-03-04 ENCOUNTER — Encounter: Payer: Self-pay | Admitting: Family Medicine

## 2020-03-04 VITALS — BP 149/82 | HR 76 | Temp 98.2°F | Ht 66.0 in | Wt 190.5 lb

## 2020-03-04 DIAGNOSIS — R296 Repeated falls: Secondary | ICD-10-CM

## 2020-03-04 DIAGNOSIS — Z9181 History of falling: Secondary | ICD-10-CM | POA: Diagnosis not present

## 2020-03-04 DIAGNOSIS — R2681 Unsteadiness on feet: Secondary | ICD-10-CM | POA: Diagnosis not present

## 2020-03-04 DIAGNOSIS — G35 Multiple sclerosis: Secondary | ICD-10-CM | POA: Diagnosis not present

## 2020-03-04 NOTE — Patient Instructions (Signed)
Work on the stretches as we discussed - keep mobility.  Try heat application.   I am glad you are ok.

## 2020-03-04 NOTE — Progress Notes (Signed)
This visit occurred during the SARS-CoV-2 public health emergency.  Safety protocols were in place, including screening questions prior to the visit, additional usage of staff PPE, and extensive cleaning of exam room while observing appropriate contact time as indicated for disinfecting solutions.    Daisy Bennett , 03-09-1956, 64 y.o., female MRN: [id number] Patient Care Team    Relationship Specialty Notifications Start End  Ma Hillock, DO PCP - General Family Medicine  03/10/15   Josue Hector, MD PCP - Cardiology Cardiology Admissions 05/31/18   [place], Jennefer Bravo, MD Referring Physician Dermatology  02/10/16   Kathrynn Ducking, MD Consulting Physician Neurology  05/18/16   Linda Hedges, [place] Physician Obstetrics and Gynecology  06/04/16   Anell Barr, OD  Optometry  08/17/16     Chief Complaint  Patient presents with  . Follow-up    er visit follow-up     Subjective: Pt presents for an OV for emergency room follow-up after fall.  Patient has had multiple falls over the course of the last 2 years.  She had sustained injury to these falls including a subarachnoid hemorrhage.  She is a multiple sclerosis patient and does endorse having some balance issues today. During most recent fall she did injure her right wrist.  She states she did wear the wrist splint for 2 weeks and wrist is feeling much improved.  She also sustained a tendon injury of her index finger.  She has followed with orthopedics for these injuries. She reports tripping at the dentist office and falling forward hitting her chest against the counter and then falling to the floor.  She reports her chest is still sore, but overall she is improving daily. Reviewed lab work and x-rays collected in the emergency room.  Depression screen [facility] 2/9 07/04/2019 02/15/2018 10/17/2017 06/17/2017 06/04/2016  Decreased Interest 0 0 0 0 0  Down, Depressed, Hopeless 0 0 0 0 0  PHQ - 2 Score 0 0 0 0 0  Altered  sleeping - - 2 - -  Tired, decreased energy - - 0 - -  Change in appetite - - 1 - -  Feeling bad or failure about yourself  - - 0 - -  Trouble concentrating - - 0 - -  Moving slowly or fidgety/restless - - 0 - -  Suicidal thoughts - - 0 - -  PHQ-9 Score - - 3 - -  Some recent data might be hidden    Allergies  Allergen Reactions  . Betaseron [Interferon Beta-1b] Other (See Comments)    Increased LFT's  . Provigil [Modafinil] Swelling    Tongue swelling and sores  . Topamax [Topiramate]     Cognitive slowing  . Doxycycline Other (See Comments)    Thrush,dizziness   Social History   Social History Narrative   The patient lives with husband. 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    Patient drinks occasionally, does not chew tobacco, does not use recreational drugs. She does drink occasional caffeine. She does wear her seatbelt. She does wear bike helmet riding a bike. She does exercise 3 times a week. She does not wear hearing aids or dentures. There is a smoke alarm in her home. There are no firearms in her home. She feels safe in her relationship. She has never experienced physical abuse.   Patient sleeps 7-8 hours a night.   Patient drinks 2 cups of caffeine daily.   Patient  is right handed.   Past Medical History:  Diagnosis Date  . Basal cell carcinoma   . Brain bleed ([place]) 2017  . COVID-19 08/2019  . DM (diabetes mellitus) ([place])   . Former smoker   . 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 Goiter, unspecified   . History of colonic polyps 2012  . Multiple sclerosis ([facility])   . Non-ST elevated myocardial infarction (non-STEMI) ([place])   . Oral mucositis 08/31/2019  . SCCA (squamous cell carcinoma) of skin    right shin  . SEMI (subendocardial myocardial infarction) ([place]) 09/29/09   TOTAL RCA WITH STENT TO OM BRANCH  . Shingles   . Squamous cell carcinoma 2019   lower ext  . Unspecified essential hypertension   . Unspecified hypothyroidism     Past Surgical History:  Procedure Laterality Date  . CATARACT SURGERY  12/10  . CESAREAN SECTION    . CORONARY ANGIOPLASTY WITH STENT PLACEMENT  2011  . KNEE SURGERY    . leg surgery     squamous ccell removed   . TONSILLECTOMY    . VESICOVAGINAL FISTULA CLOSURE W/ TAH     Family History  Problem Relation Age of Onset  . Hypertension Mother   . Heart failure Father   . Diabetes Father   . Aneurysm Brother        THORACIC/ABD ANEURYSM  . Prostate cancer Brother 8  . Diabetes Brother   . Fibromyalgia Sister   . Brain cancer Maternal Grandmother 20  . Multiple sclerosis Neg Hx   . Colon cancer Neg Hx   . Esophageal cancer Neg Hx    Allergies as of 03/04/2020      Reactions   Betaseron [interferon Beta-1b] Other (See Comments)   Increased LFT's   Provigil [modafinil] Swelling   Tongue swelling and sores   Topamax [topiramate]    Cognitive slowing   Doxycycline Other (See Comments)   Thrush,dizziness      Medication List       Accurate as of March 04, 2020 11:59 PM. If you have any questions, ask your nurse or doctor.        STOP taking these medications   HYDROcodone-acetaminophen 5-325 MG tablet Commonly known as: NORCO/VICODIN Stopped by: Howard Pouch, DO     TAKE these medications   amitriptyline 100 MG tablet Commonly known as: ELAVIL Take 1 tablet (100 mg total) by mouth daily.   amLODipine 10 MG tablet Commonly known as: NORVASC Take 1 tablet (10 mg total) by mouth daily.   aspirin EC 81 MG tablet Take 1 tablet (81 mg total) daily by mouth.   carvedilol 6.25 MG tablet Commonly known as: COREG Take 1 tablet (6.25 mg total) by mouth 2 (two) times daily.   cholecalciferol 1000 units tablet Commonly known as: VITAMIN D Take 1,000 Units by mouth daily.   Dimethyl Fumarate 120 MG Cpdr Commonly known as: Tecfidera Take 120 mg by mouth 2 (two) times daily.   esomeprazole 20 MG capsule Commonly known as: NEXIUM Take 1 capsule (20 mg total) by  mouth daily at 12 noon.   fenofibrate 160 MG tablet Take 1 tablet (160 mg total) by mouth daily. OFFICE VISIT NEEDED   fish oil-omega-3 fatty acids 1000 MG capsule Take 1 g by mouth daily.   gabapentin 600 MG tablet Commonly known as: NEURONTIN TAKE 1 TABLET BY MOUTH TWICE A DAY   glimepiride 2 MG tablet Commonly known as: AMARYL 2 tab in the morning and  1/2  tab in the evening.   hydrochlorothiazide 50 MG tablet Commonly known as: HYDRODIURIL Take 1 tablet (50 mg total) by mouth daily.   Iron 325 (65 Fe) MG Tabs Take 325 mg by mouth daily.   levothyroxine 112 MCG tablet Commonly known as: SYNTHROID Take 1 tablet (112 mcg total) by mouth daily.   losartan 100 MG tablet Commonly known as: COZAAR Take 1 tablet (100 mg total) by mouth daily.   metFORMIN 1000 MG tablet Commonly known as: GLUCOPHAGE Take 1 tablet (1,000 mg total) by mouth daily with breakfast.   nitroGLYCERIN 0.4 MG SL tablet Commonly known as: Nitrostat Place 1 tablet (0.4 mg total) under the tongue every 5 (five) minutes x 3 doses as needed for chest pain.   potassium chloride 10 MEQ tablet Commonly known as: KLOR-CON Take 1 tablet (10 mEq total) by mouth daily.   prasugrel 10 MG Tabs tablet Commonly known as: EFFIENT TAKE 1 TABLET BY MOUTH EVERY DAY   rosuvastatin 10 MG tablet Commonly known as: CRESTOR TAKE 1 TABLET BY MOUTH DAILY. PLEASE MAKE YEARLY APPT WITH DR. Johnsie Cancel FOR OCTOBER FOR FUTURE REFILLS   venlafaxine XR 75 MG 24 hr capsule Commonly known as: Effexor XR Take 3 capsules (225 mg total) by mouth daily with breakfast.       All past medical history, surgical history, allergies, family history, immunizations andmedications were updated in the EMR today and reviewed under the history and medication portions of their EMR.     ROS: Negative, with the exception of above mentioned in HPI   Objective:  BP (!) 149/82 (BP Location: Left Arm, Patient Position: Sitting, Cuff Size: Large)    Pulse 76   Temp 98.2 F (36.8 C) (Oral)   Ht 5\' 6"  (1.676 m)   Wt 190 lb 8 oz (86.4 kg)   SpO2 100%   BMI 30.75 kg/m  Body mass index is 30.75 kg/m. Gen: Afebrile. No acute distress. Nontoxic in appearance, well developed, well nourished.  HENT: AT. [place].  Eyes:Pupils Equal Round Reactive to light, Extraocular movements intact,  Conjunctiva without redness, discharge or icterus. CV: RRR Chest: CTAB, no wheeze or crackles. Good air movement, normal resp effort.  MSK: Mild tender to palpation mid sternum.  Index finger in splint.  Full range of motion of right wrist.  Neurovascularly intact distally Skin: Multiple areas of bruising on bilateral upper extremities and chest. Neuro:  PERLA. EOMi. Alert. Oriented x3 Psych: Normal affect, dress and demeanor. Normal speech. Normal thought content and judgment.  No exam data present No results found. No results found for this or any previous visit (from the past 24 hour(s)).  Assessment/Plan: TAKYIA SINDT is a 64 y.o. female present for OV for  Multiple sclerosis (HCC)/gait instability/multiple falls/at high risk for falls Discussed benefits of physical therapy to help her work on her gait strength and stability.  Patient has multiple sclerosis and would benefit from gait training.  She is agreeable to this approach today. - Ambulatory referral to Physical Therapy placed today Patient understands with her being on blood thinner any falls should be evaluated and any falls with head trauma need to be seen emergently in the ED.  She reports understanding. Follow-up as needed    Reviewed expectations re: course of current medical issues.  Discussed self-management of symptoms.  Outlined signs and symptoms indicating need for more acute intervention.  Patient verbalized understanding and all questions were answered.  Patient received an After-Visit Summary.    Orders  Placed This Encounter  Procedures  . Ambulatory referral to  Physical Therapy   No orders of the defined types were placed in this encounter.   Referral Orders     Ambulatory referral to Physical Therapy   Note is dictated utilizing voice recognition software. Although note has been proof read prior to signing, occasional typographical errors still can be missed. If any questions arise, please do not hesitate to call for verification.   electronically signed by:  Howard Pouch, DO  [place]

## 2020-03-13 ENCOUNTER — Other Ambulatory Visit: Payer: Self-pay | Admitting: Cardiovascular Disease

## 2020-03-17 ENCOUNTER — Telehealth: Payer: Self-pay | Admitting: Neurology

## 2020-03-17 MED ORDER — DIMETHYL FUMARATE 120 MG PO CPDR: 120.0000 mg | DELAYED_RELEASE_CAPSULE | Freq: Two times a day (BID) | ORAL | 1 refills | Status: DC

## 2020-03-17 NOTE — Telephone Encounter (Signed)
Pt request refill Dimethyl Fumarate (TECFIDERA) 120 MG CPDR would like to change pharmacy to [facility].

## 2020-03-17 NOTE — Addendum Note (Signed)
Addended by: Brandon Melnick on: 03/17/2020 01:57 PM   Modules accepted: Orders

## 2020-03-18 ENCOUNTER — Telehealth: Payer: Self-pay | Admitting: *Deleted

## 2020-03-18 ENCOUNTER — Other Ambulatory Visit: Payer: Self-pay

## 2020-03-18 ENCOUNTER — Ambulatory Visit (INDEPENDENT_AMBULATORY_CARE_PROVIDER_SITE_OTHER): Payer: Medicare Other | Admitting: Rehabilitative and Restorative Service Providers"

## 2020-03-18 DIAGNOSIS — R2681 Unsteadiness on feet: Secondary | ICD-10-CM | POA: Diagnosis not present

## 2020-03-18 DIAGNOSIS — R2689 Other abnormalities of gait and mobility: Secondary | ICD-10-CM

## 2020-03-18 DIAGNOSIS — R29818 Other symptoms and signs involving the nervous system: Secondary | ICD-10-CM

## 2020-03-18 DIAGNOSIS — M6281 Muscle weakness (generalized): Secondary | ICD-10-CM | POA: Diagnosis not present

## 2020-03-18 NOTE — Patient Instructions (Signed)
Access Code: [email or web address] URL: [email or web address] Date: 03/18/2020 Prepared by: Rudell Cobb  Exercises Wide Stance with Eyes Closed - 2 x daily - 7 x weekly - 1 sets - 3 reps - 30 seconds hold Standing with Head Rotation - 2 x daily - 7 x weekly - 1 sets - 10 reps Standing Heel Raise - 2 x daily - 7 x weekly - 1 sets - 10 reps

## 2020-03-18 NOTE — Telephone Encounter (Signed)
I spoke to [place], pharmacist at [facility], wanted to confirm that tecfidera (dimethyl fumarate) is indeed 120mg  po bid, I relayed that it is (she is on low dose).  She appreciated call.

## 2020-03-18 NOTE — Therapy (Signed)
[place], [place], [address] Phone: [phone]   Fax:  [phone]  Physical Therapy Evaluation  Patient Details  Name: Daisy Bennett MRN: [id number] Date of Birth: 10/04/1955 Referring Provider (PT): Howard Pouch, DO   Encounter Date: 03/18/2020   PT End of Session - 03/18/20 1013    Visit Number 1    Number of Visits 12    Date for PT Re-Evaluation 04/29/20    Authorization Type medicare/, AARP    PT Start Time [phone]    PT Stop Time 1013    PT Time Calculation (min) 46 min    Activity Tolerance Patient tolerated treatment well    Behavior During Therapy [facility] for tasks assessed/performed           Past Medical History:  Diagnosis Date  . Basal cell carcinoma   . Brain bleed ([place]) 2017  . COVID-19 08/2019  . DM (diabetes mellitus) ([place])   . Former smoker   . 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 Goiter, unspecified   . History of colonic polyps 2012  . Multiple sclerosis ([place])   . Non-ST elevated myocardial infarction (non-STEMI) ([place])   . Oral mucositis 08/31/2019  . SCCA (squamous cell carcinoma) of skin    right shin  . SEMI (subendocardial myocardial infarction) ([place]) 09/29/09   TOTAL RCA WITH STENT TO OM BRANCH  . Shingles   . Squamous cell carcinoma 2019   lower ext  . Unspecified essential hypertension   . Unspecified hypothyroidism     Past Surgical History:  Procedure Laterality Date  . CATARACT SURGERY  12/10  . CESAREAN SECTION    . CORONARY ANGIOPLASTY WITH STENT PLACEMENT  2011  . KNEE SURGERY    . leg surgery     squamous ccell removed   . TONSILLECTOMY    . VESICOVAGINAL FISTULA CLOSURE W/ TAH      There were no vitals filed for this visit.    Subjective Assessment - 03/18/20 0926    Subjective The patient reports she falls a lot noting "the things I fall on could happen to anyone."  She reports 3 falls in the past 6 months.  She has had a h/o  subarachnoid hemorrhage she estimates 4 years ago from a fall.  She does not note any pattern to falls.  She does not use a device for walking "I feel like I don't really need it."   She reports she is in PT for her finger at another clinic (has another visit next week).    Pertinent History MS (diagnosed 25 years ago per report), subarachnoid hemorrhage, hypothyroidism, cateracts with multi-focal lens (trips going up steps)    Patient Stated Goals "I don't know that we can do anything."  She wonders if someone else would catch themselves.    Currently in Pain? No/denies              [facility] PT Assessment - 03/18/20 3474      Assessment   Medical Diagnosis MS, gait instability, multiple falls    Referring Provider (PT) Howard Pouch, DO    Onset Date/Surgical Date 03/04/20    Hand Dominance Right    Prior Therapy therapy for finger at another clinic      Precautions   Precautions Fall      Restrictions   Weight Bearing Restrictions No      Balance Screen   Has the patient fallen in the past 6  months Yes    How many times? 3    Has the patient had a decrease in activity level because of a fear of falling?  No    Is the patient reluctant to leave their home because of a fear of falling?  No      Home Ecologist residence    Living Arrangements Spouse/significant other    Home Access Stairs to enter    Entrance Stairs-Number of Steps 5    Entrance Stairs-Rails --   one [place] One level    [place] None      Prior Function   Level of [place] Retired    Warehouse manager Intact      Posture/Postural Control   Posture/Postural Control Postural limitations    Postural Limitations Rounded Shoulders;Forward head      ROM / Strength   AROM / PROM / Strength AROM;Strength      AROM   Overall AROM  Within functional limits for tasks  performed      Strength   Overall Strength Deficits    Strength Assessment Site Shoulder;Hip;Knee;Ankle    Right/Left Shoulder Right;Left    Right Shoulder Flexion 4+/5    Right Shoulder ABduction 4/5    Left Shoulder Flexion 4+/5    Left Shoulder ABduction 4/5    Right/Left Hip Right;Left    Right Hip Flexion 3+/5    Left Hip Flexion 3+/5    Right/Left Knee Right;Left    Right Knee Flexion 5/5    Right Knee Extension 5/5    Left Knee Flexion 5/5    Left Knee Extension 5/5    Right/Left Ankle Right;Left    Right Ankle Dorsiflexion 5/5    Left Ankle Dorsiflexion 5/5      Transfers   Transfers Sit to Stand;Stand to Sit    Sit to Stand 7: Independent;Without upper extremity assist    Stand to Sit 7: Independent;Without upper extremity assist      Ambulation/Gait   Ambulation/Gait Yes    Ambulation/Gait Assistance 7: Independent    Assistive device None    Ambulation Surface Level;Indoor    Gait velocity 3.02 ft/sec    Stairs Yes    Stairs Assistance 6: Modified independent (Device/Increase time)    Stair Management Technique One rail Right;Alternating pattern    Number of Stairs 4      Standardized Balance Assessment   Standardized Balance Assessment Berg Balance Test      Berg Balance Test   Sit to Stand Able to stand without using hands and stabilize independently    Standing Unsupported Able to stand safely 2 minutes    Sitting with Back Unsupported but Feet Supported on Floor or Stool Able to sit safely and securely 2 minutes    Stand to Sit Sits safely with minimal use of hands    Transfers Able to transfer safely, minor use of hands    Standing Unsupported with Eyes Closed Able to stand 10 seconds with supervision    Standing Unsupported with Feet Together Able to place feet together independently and stand 1 minute safely    From Standing, Reach Forward with Outstretched Arm Can reach forward >12 cm safely (5")    From Standing Position, Pick up Object from Floor  Able to pick up shoe safely and easily    From Standing  Position, Turn to Look Behind Over each Shoulder Looks behind one side only/other side shows less weight shift    Turn 360 Degrees Able to turn 360 degrees safely but slowly    Standing Unsupported, Alternately Place Feet on Step/Stool Able to complete 4 steps without aid or supervision    Standing Unsupported, One Foot in Front Able to take small step independently and hold 30 seconds    Standing on One Leg Unable to try or needs assist to prevent fall    Total Score 43      High Level Balance   High Level Balance Comments single leg stance L 1 second, R 3 seconds      Functional Gait  Assessment   Gait assessed  Yes    Gait Level Surface Walks 20 ft in less than 7 sec but greater than 5.5 sec, uses assistive device, slower speed, mild gait deviations, or deviates 6-10 in outside of the 12 in walkway width.    Change in Gait Speed Makes only minor adjustments to walking speed, or accomplishes a change in speed with significant gait deviations, deviates 10-15 in outside the 12 in walkway width, or changes speed but loses balance but is able to recover and continue walking.    Gait with Horizontal Head Turns Performs head turns smoothly with slight change in gait velocity (eg, minor disruption to smooth gait path), deviates 6-10 in outside 12 in walkway width, or uses an assistive device.    Gait with Vertical Head Turns Performs task with slight change in gait velocity (eg, minor disruption to smooth gait path), deviates 6 - 10 in outside 12 in walkway width or uses assistive device    Gait and Pivot Turn Pivot turns safely within 3 sec and stops quickly with no loss of balance.    Step Over Obstacle Is able to step over one shoe box (4.5 in total height) but must slow down and adjust steps to clear box safely. May require verbal cueing.    Gait with Narrow Base of Support Ambulates less than 4 steps heel to toe or cannot perform without  assistance.    Gait with Eyes Closed Cannot walk 20 ft without assistance, severe gait deviations or imbalance, deviates greater than 15 in outside 12 in walkway width or will not attempt task.    Ambulating Backwards Walks 20 ft, slow speed, abnormal gait pattern, evidence for imbalance, deviates 10-15 in outside 12 in walkway width.    Steps Alternating feet, must use rail.    Total Score 14    FGA comment: 14/30 indicating high fall risk                      Objective measurements completed on examination: See above findings.               PT Education - 03/18/20 1004    Education Details HEP    Person(s) Educated Patient    Methods Explanation;Demonstration;Handout    Comprehension Returned demonstration;Verbalized understanding               PT Long Term Goals - 03/18/20 1219      PT LONG TERM GOAL #1   Title The patient will be independent with HEP for multi-sensory balance, mobility, and balance strategies.    Time 6    Period Weeks    Target Date 04/29/20      PT LONG TERM GOAL #2   Title The patient  will improve Berg balance score from 43/56 to > or equal to 48/56 demonstrating dec'd risk for falls.    Time 6    Period Weeks    Target Date 04/29/20      PT LONG TERM GOAL #3   Title The patient will improve FGA from 14/30 to > or equal to 22/30 to demo dec'ing risk for falls.    Time 6    Period Weeks    Target Date 04/29/20      PT LONG TERM GOAL #4   Title The patient will move floor<>stand witout UE support due to h/o falls.    Time 6    Period Weeks    Target Date 04/29/20      PT LONG TERM GOAL #5   Title The patient will verbalize strategies to reduce fall risk in home/community (focus on multi-tasking, awareness of fall hazards in environment, etc).    Time 6    Period Weeks    Target Date 04/29/20                  Plan - 03/18/20 1222    Clinical Impression Statement The patient is a 64 yo female presenting to  OP physical therapy with h/o MS, falls with injury, and worsening balance.  She presents with impairments in hip strength, multi-sensory balance control, dec'd stepping strategy, dec'd balance, dec'd dynamic gait leading to falls.  PT to address deficits to emphasize fall reduction and optimize current functional status.    Personal Factors and Comorbidities Comorbidity 2;Comorbidity 1    Comorbidities MS, subarachnoic hemorrhage    Examination-Activity Limitations Locomotion Level;Squat    Examination-Participation Restrictions Community Activity    Stability/Clinical Decision Making Stable/Uncomplicated    Clinical Decision Making Low    Rehab Potential Good    PT Frequency 2x / week    PT Duration 6 weeks    PT Treatment/Interventions ADLs/Self Care Home Management;Aquatic Therapy;Gait training;Stair training;Functional mobility training;Therapeutic activities;Therapeutic exercise;Balance training;Neuromuscular re-education;Manual techniques;Taping;Patient/family education    PT Next Visit Plan check HEP, work on hip and core strength, focus on balance strategies (hip, ankle, stepping), compliant surface negotiation, dynamic gait/balance    PT Home Exercise Plan Access Code: [email or web address]    Consulted and Agree with Plan of Care Patient           Patient will benefit from skilled therapeutic intervention in order to improve the following deficits and impairments:  Decreased balance, Decreased strength, Impaired flexibility, Postural dysfunction, Decreased activity tolerance, Abnormal gait, Difficulty walking  Visit Diagnosis: Other abnormalities of gait and mobility  Muscle weakness (generalized)  Unsteadiness on feet  Other symptoms and signs involving the nervous system     Problem List Patient Active Problem List   Diagnosis Date Noted  . Dry mouth 11/23/2019  . Oral mucositis 08/31/2019  . Chronic kidney disease (CKD), stage III (moderate) 07/05/2019  . Mild neurocognitive  disorder due to another medical condition 11/17/2017  . Olecranon bursitis of left elbow 10/17/2017  . Memory deficit 06/17/2017  . Controlled type 2 diabetes mellitus without complication, without long-term current use of insulin ([place]) 02/14/2017  . Antiplatelet or antithrombotic long-term use 05/18/2016  . Subdural hematoma ([place]) 05/18/2016  . Morbid obesity ([place]) 05/12/2016  . History of colon polyps 12/15/2015  . Tobacco abuse counseling 03/20/2015  . Mixed hyperlipidemia 10/23/2009  . Coronary atherosclerosis 10/23/2009  . CARDIAC MURMUR 10/23/2009  . Hypothyroidism 10/15/2009  . Multiple sclerosis ([place]) 10/15/2009  . Essential hypertension 10/15/2009  [place], [place] 03/18/2020, 12:29 PM  [facility] [place], [place], [address] Phone: [phone]   Fax:  [phone]  Name: Daisy Bennett MRN: [id number] Date of Birth: [birth year]

## 2020-03-20 ENCOUNTER — Ambulatory Visit (INDEPENDENT_AMBULATORY_CARE_PROVIDER_SITE_OTHER): Payer: Medicare Other | Admitting: Physical Therapy

## 2020-03-20 ENCOUNTER — Other Ambulatory Visit: Payer: Self-pay

## 2020-03-20 DIAGNOSIS — R29818 Other symptoms and signs involving the nervous system: Secondary | ICD-10-CM

## 2020-03-20 DIAGNOSIS — R2689 Other abnormalities of gait and mobility: Secondary | ICD-10-CM | POA: Diagnosis present

## 2020-03-20 DIAGNOSIS — R2681 Unsteadiness on feet: Secondary | ICD-10-CM

## 2020-03-20 DIAGNOSIS — M6281 Muscle weakness (generalized): Secondary | ICD-10-CM

## 2020-03-20 NOTE — Therapy (Addendum)
[place], [place], [address] Phone: [phone]   Fax:  [phone]  Physical Therapy Treatment  Patient Details  Name: Daisy Bennett MRN: [id number] Date of Birth: 1955-12-10 Referring Provider (PT): Howard Pouch, DO   Encounter Date: 03/20/2020   PT End of Session - 03/20/20 1154    Visit Number 2    Number of Visits 12    Date for PT Re-Evaluation 04/29/20    Authorization Type medicare/, AARP    PT Start Time 1150    PT Stop Time 1229    PT Time Calculation (min) 39 min    Activity Tolerance Patient tolerated treatment well    Behavior During Therapy [facility] for tasks assessed/performed           Past Medical History:  Diagnosis Date  . Basal cell carcinoma   . Brain bleed ([place]) 2017  . COVID-19 08/2019  . DM (diabetes mellitus) ([place])   . Former smoker   . 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 Goiter, unspecified   . History of colonic polyps 2012  . Multiple sclerosis ([place])   . Non-ST elevated myocardial infarction (non-STEMI) ([place])   . Oral mucositis 08/31/2019  . SCCA (squamous cell carcinoma) of skin    right shin  . SEMI (subendocardial myocardial infarction) ([place]) 09/29/09   TOTAL RCA WITH STENT TO OM BRANCH  . Shingles   . Squamous cell carcinoma 2019   lower ext  . Unspecified essential hypertension   . Unspecified hypothyroidism     Past Surgical History:  Procedure Laterality Date  . CATARACT SURGERY  12/10  . CESAREAN SECTION    . CORONARY ANGIOPLASTY WITH STENT PLACEMENT  2011  . KNEE SURGERY    . leg surgery     squamous ccell removed   . TONSILLECTOMY    . VESICOVAGINAL FISTULA CLOSURE W/ TAH      There were no vitals filed for this visit.   Subjective Assessment - 03/20/20 1154    Subjective Pt reports no changes since last visit. She performed some of the exercises with her 66 yr old mom.    Pertinent History MS (diagnosed 25 years ago per report),  subarachnoid hemorrhage, hypothyroidism, cateracts with multi-focal lens (trips going up steps)    Currently in Pain? No/denies              [facility] PT Assessment - 03/20/20 0001      Assessment   Medical Diagnosis MS, gait instability, multiple falls    Referring Provider (PT) Howard Pouch, DO    Onset Date/Surgical Date 03/04/20    Hand Dominance Right    Prior Therapy therapy for finger at another clinic            Balance Exercises - 03/20/20 0001      Balance Exercises: Standing   Standing Eyes Opened Narrow base of support (BOS);Head turns;Solid surface;2 reps;10 secs    Standing Eyes Closed Narrow base of support (BOS);Wide (BOA);2 reps;10 secs;30 secs    Tandem Stance Eyes open;2 reps    SLS Eyes open;Solid surface;1 rep   7 sec LLE, 5 sec RLE   SLS with Vectors Solid surface;1 rep;20 secs   SLS with opp leg toe tap front, side, back   Wall Bumps Shoulder;Hip;Eyes opened;Eyes closed;5 reps    Tandem Gait Forward;Retro;4 reps    Partial Tandem Stance 2 reps;25 secs   with slow head turns   Marching Solid surface;Static;10  reps   high knee into SLS x 4 sec   Heel Raises Both;10 reps    Toe Raise Both;10 reps   intermittent UE to stead   Sit to Stand Standard surface;Without upper extremity support   5 reps, eccentric lowering, cues for form            PT Education - 03/20/20 1256    Education Details HEP    Person(s) Educated Patient    Methods Explanation;Handout;Verbal cues    Comprehension Returned demonstration;Verbalized understanding               PT Long Term Goals - 03/18/20 1219      PT LONG TERM GOAL #1   Title The patient will be independent with HEP for multi-sensory balance, mobility, and balance strategies.    Time 6    Period Weeks    Target Date 04/29/20      PT LONG TERM GOAL #2   Title The patient will improve Berg balance score from 43/56 to > or equal to 48/56 demonstrating dec'd risk for falls.    Time 6    Period Weeks     Target Date 04/29/20      PT LONG TERM GOAL #3   Title The patient will improve FGA from 14/30 to > or equal to 22/30 to demo dec'ing risk for falls.    Time 6    Period Weeks    Target Date 04/29/20      PT LONG TERM GOAL #4   Title The patient will move floor<>stand witout UE support due to h/o falls.    Time 6    Period Weeks    Target Date 04/29/20      PT LONG TERM GOAL #5   Title The patient will verbalize strategies to reduce fall risk in home/community (focus on multi-tasking, awareness of fall hazards in environment, etc).    Time 6    Period Weeks    Target Date 04/29/20                 Plan - 03/20/20 1214    Clinical Impression Statement Pt initially required freq UE support on surface to steady self with tandem stance and tandem gait, but with cue to soften and relax LE, as well as repetition, pt's balance improved.  Decreased balance seen with Rt SLS.  Pt tolerated all exercises well, without overt loss of balance.  Progressed HEP today.  Goals are ongoing.    Personal Factors and Comorbidities Comorbidity 2;Comorbidity 1    Comorbidities MS, subarachnoic hemorrhage    Examination-Activity Limitations Locomotion Level;Squat    Examination-Participation Restrictions Community Activity    Stability/Clinical Decision Making Stable/Uncomplicated    Rehab Potential Good    PT Frequency 2x / week    PT Duration 6 weeks    PT Treatment/Interventions ADLs/Self Care Home Management;Aquatic Therapy;Gait training;Stair training;Functional mobility training;Therapeutic activities;Therapeutic exercise;Balance training;Neuromuscular re-education;Manual techniques;Taping;Patient/family education    PT Next Visit Plan add work on hip and core strength, focus on balance strategies (hip, ankle, stepping), compliant surface negotiation, dynamic gait/balance    PT Home Exercise Plan Access Code: [email or web address]    Consulted and Agree with Plan of Care Patient           Patient will  benefit from skilled therapeutic intervention in order to improve the following deficits and impairments:  Decreased balance, Decreased strength, Impaired flexibility, Postural dysfunction, Decreased activity tolerance, Abnormal gait, Difficulty walking  Visit Diagnosis: Other abnormalities  of gait and mobility  Muscle weakness (generalized)  Unsteadiness on feet  Other symptoms and signs involving the nervous system     Problem List Patient Active Problem List   Diagnosis Date Noted  . Dry mouth 11/23/2019  . Oral mucositis 08/31/2019  . Chronic kidney disease (CKD), stage III (moderate) 07/05/2019  . 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 Olecranon bursitis of left elbow 10/17/2017  . Memory deficit 06/17/2017  . Controlled type 2 diabetes mellitus without complication, without long-term current use of insulin ([place]) 02/14/2017  . Antiplatelet or antithrombotic long-term use 05/18/2016  . Subdural hematoma ([place]) 05/18/2016  . Morbid obesity ([place]) 05/12/2016  . History of colon polyps 12/15/2015  . Tobacco abuse counseling 03/20/2015  . Mixed hyperlipidemia 10/23/2009  . Coronary atherosclerosis 10/23/2009  . CARDIAC MURMUR 10/23/2009  . Hypothyroidism 10/15/2009  . Multiple sclerosis ([place]) 10/15/2009  . Essential hypertension 10/15/2009   Kerin Perna, PTA 03/20/20 12:56 PM  [place], [place], [address] Phone: [phone]   Fax:  [phone]  Name: MAIDA WIDGER MRN: [id number] Date of Birth: 06/15/1956

## 2020-03-20 NOTE — Patient Instructions (Signed)
Access Code: [email or web address]: [email or web address]: 08/19/2021Prepared by: [place]  Wide Stance with Eyes Closed - 2 x daily - 7 x weekly - 1 sets - 3 reps - 30 seconds hold  Standing with Head Rotation - 2 x daily - 7 x weekly - 1 sets - 10 reps  Standing Heel Raise - 2 x daily - 7 x weekly - 1 sets - 10 reps  Tandem Walking with Counter Support - 1 x daily - 7 x weekly - 1 sets - 3 reps  Backward Tandem Walking with Counter Support - 1 x daily - 7 x weekly - 1 sets - 3 reps  Standing Single Leg Stance with Counter Support - 1 x daily - 7 x weekly - 1 sets - 2 reps - 15-30 seconds hold

## 2020-03-24 ENCOUNTER — Other Ambulatory Visit: Payer: Self-pay

## 2020-03-24 ENCOUNTER — Encounter: Payer: Self-pay | Admitting: Rehabilitative and Restorative Service Providers"

## 2020-03-24 ENCOUNTER — Ambulatory Visit (INDEPENDENT_AMBULATORY_CARE_PROVIDER_SITE_OTHER): Payer: Medicare Other | Admitting: Rehabilitative and Restorative Service Providers"

## 2020-03-24 DIAGNOSIS — R2681 Unsteadiness on feet: Secondary | ICD-10-CM | POA: Diagnosis not present

## 2020-03-24 DIAGNOSIS — M6281 Muscle weakness (generalized): Secondary | ICD-10-CM

## 2020-03-24 DIAGNOSIS — R29818 Other symptoms and signs involving the nervous system: Secondary | ICD-10-CM | POA: Diagnosis not present

## 2020-03-24 DIAGNOSIS — R2689 Other abnormalities of gait and mobility: Secondary | ICD-10-CM

## 2020-03-24 NOTE — Therapy (Signed)
[place], [place], [address] Phone: [phone]   Fax:  [phone]  Physical Therapy Treatment  Patient Details  Name: Daisy Bennett MRN: [id number] Date of Birth: 1956/07/21 Referring Provider (PT): Howard Pouch, DO   Encounter Date: 03/24/2020   PT End of Session - 03/24/20 1107    Visit Number 3    Number of Visits 12    Date for PT Re-Evaluation 04/29/20    Authorization Type medicare/, AARP    PT Start Time 1103    PT Stop Time 1145    PT Time Calculation (min) 42 min    Activity Tolerance Patient tolerated treatment well    Behavior During Therapy [facility] for tasks assessed/performed           Past Medical History:  Diagnosis Date  . Basal cell carcinoma   . Brain bleed ([place]) 2017  . COVID-19 08/2019  . DM (diabetes mellitus) ([place])   . Former smoker   . 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 Goiter, unspecified   . History of colonic polyps 2012  . Multiple sclerosis ([place])   . Non-ST elevated myocardial infarction (non-STEMI) ([place])   . Oral mucositis 08/31/2019  . SCCA (squamous cell carcinoma) of skin    right shin  . SEMI (subendocardial myocardial infarction) ([place]) 09/29/09   TOTAL RCA WITH STENT TO OM BRANCH  . Shingles   . Squamous cell carcinoma 2019   lower ext  . Unspecified essential hypertension   . Unspecified hypothyroidism     Past Surgical History:  Procedure Laterality Date  . CATARACT SURGERY  12/10  . CESAREAN SECTION    . CORONARY ANGIOPLASTY WITH STENT PLACEMENT  2011  . KNEE SURGERY    . leg surgery     squamous ccell removed   . TONSILLECTOMY    . VESICOVAGINAL FISTULA CLOSURE W/ TAH      There were no vitals filed for this visit.   Subjective Assessment - 03/24/20 1104    Subjective Patient reports her lower legs were sore after one leg stands.    Pertinent History MS (diagnosed 25 years ago per report), subarachnoid hemorrhage,  hypothyroidism, cateracts with multi-focal lens (trips going up steps)    Patient Stated Goals "I don't know that we can do anything."  She wonders if someone else would catch themselves.    Currently in Pain? No/denies                             [facility] Adult PT Treatment/Exercise - 03/24/20 1108      Ambulation/Gait   Ambulation/Gait Yes    Ambulation/Gait Assistance 7: Independent    Ambulation Distance (Feet) 500 Feet    Assistive device None    Gait Comments dynamic gait with head circles following a ball CW and CCW, gait with ball pass, gait wiht ball toss, tandem gait and marching with 3 second holds every 3 steps x 10 reps      Neuro Re-ed    Neuro Re-ed Details  STanding alternating foot taps to 6" step and to cones.  Standing stepping over 4" surfaces ant/posterior and laterally., side stepping x 20 feet x 2 reps, braiding x 20 feet x 2 reps R and L sides, single leg stance and coordination near steps "up/up, down/down"      Exercises   Exercises Knee/Hip      Knee/Hip Exercises: Aerobic  Recumbent Bike L2 x 3 minutes for warm up      Knee/Hip Exercises: Standing   Lateral Step Up 10 reps    Lateral Step Up Limitations on compliant surface    Step Down 10 reps    Step Down Limitations on compliant surfaces    Wall Squat 15 reps    Wall Squat Limitations initially began with low squat and patient required assist to return to standing; therefore did 1/4 squat.    SLS SLS near support surface       Knee/Hip Exercises: Supine   Single Leg Bridge Strengthening;Both;5 reps   supine bridge with mini marching   Other Supine Knee/Hip Exercises bridge on physioball with 10 reps                       PT Long Term Goals - 03/18/20 1219      PT LONG TERM GOAL #1   Title The patient will be independent with HEP for multi-sensory balance, mobility, and balance strategies.    Time 6    Period Weeks    Target Date 04/29/20      PT LONG TERM GOAL  #2   Title The patient will improve Berg balance score from 43/56 to > or equal to 48/56 demonstrating dec'd risk for falls.    Time 6    Period Weeks    Target Date 04/29/20      PT LONG TERM GOAL #3   Title The patient will improve FGA from 14/30 to > or equal to 22/30 to demo dec'ing risk for falls.    Time 6    Period Weeks    Target Date 04/29/20      PT LONG TERM GOAL #4   Title The patient will move floor<>stand witout UE support due to h/o falls.    Time 6    Period Weeks    Target Date 04/29/20      PT LONG TERM GOAL #5   Title The patient will verbalize strategies to reduce fall risk in home/community (focus on multi-tasking, awareness of fall hazards in environment, etc).    Time 6    Period Weeks    Target Date 04/29/20                 Plan - 03/24/20 1107    Clinical Impression Statement The patient tolerates therapy well in a controlled environment.  She can demonstrate ability to perform high level dynamic tasks and standing balance without loss of balance (except on foam with eyes closed).  PT to continue to work to [employer].    Comorbidities MS, subarachnoic hemorrhage    Examination-Participation Restrictions Community Activity    Stability/Clinical Decision Making Stable/Uncomplicated    Rehab Potential Good    PT Frequency 2x / week    PT Duration 6 weeks    PT Treatment/Interventions ADLs/Self Care Home Management;Aquatic Therapy;Gait training;Stair training;Functional mobility training;Therapeutic activities;Therapeutic exercise;Balance training;Neuromuscular re-education;Manual techniques;Taping;Patient/family education    PT Next Visit Plan add work on hip and core strength, focus on balance strategies (hip, ankle, stepping), compliant surface negotiation, dynamic gait/balance    PT Home Exercise Plan Access Code: [email or web address]    Consulted and Agree with Plan of Care Patient           Patient will benefit from skilled therapeutic intervention in order  to improve the following deficits and impairments:  Decreased balance, Decreased strength, Impaired flexibility, Postural dysfunction, Decreased activity tolerance, Abnormal  gait, Difficulty walking  Visit Diagnosis: Other abnormalities of gait and mobility  Muscle weakness (generalized)  Unsteadiness on feet  Other symptoms and signs involving the nervous system     Problem List Patient Active Problem List   Diagnosis Date Noted  . Dry mouth 11/23/2019  . Oral mucositis 08/31/2019  . Chronic kidney disease (CKD), stage III (moderate) 07/05/2019  . 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 Olecranon bursitis of left elbow 10/17/2017  . Memory deficit 06/17/2017  . Controlled type 2 diabetes mellitus without complication, without long-term current use of insulin ([place]) 02/14/2017  . Antiplatelet or antithrombotic long-term use 05/18/2016  . Subdural hematoma ([place]) 05/18/2016  . Morbid obesity ([place]) 05/12/2016  . History of colon polyps 12/15/2015  . Tobacco abuse counseling 03/20/2015  . Mixed hyperlipidemia 10/23/2009  . Coronary atherosclerosis 10/23/2009  . CARDIAC MURMUR 10/23/2009  . Hypothyroidism 10/15/2009  . Multiple sclerosis ([place]) 10/15/2009  . Essential hypertension 10/15/2009    Pavielle Biggar , PT 03/24/2020, 1:30 PM  [facility] [place], [place], [address] Phone: [phone]   Fax:  [phone]  Name: Daisy Bennett MRN: [id number] Date of Birth: 1956/03/04

## 2020-03-24 NOTE — Patient Instructions (Signed)
Access Code: [email or web address] URL: [email or web address] Date: 03/24/2020 Prepared by: Rudell Cobb  Exercises Wide Tandem Stance on Foam Pad with Eyes Closed - 2 x daily - 7 x weekly - 1 sets - 3 reps - 20 seconds hold Standing with Head Rotation - 2 x daily - 7 x weekly - 1 sets - 10 reps Standing Heel Raise - 2 x daily - 7 x weekly - 1 sets - 10 reps Tandem Walking with Counter Support - 1 x daily - 7 x weekly - 1 sets - 3 reps Backward Tandem Walking with Counter Support - 1 x daily - 7 x weekly - 1 sets - 3 reps Standing Single Leg Stance with Counter Support - 1 x daily - 7 x weekly - 1 sets - 2 reps - 15-30 seconds hold

## 2020-03-25 ENCOUNTER — Ambulatory Visit (INDEPENDENT_AMBULATORY_CARE_PROVIDER_SITE_OTHER): Payer: Medicare Other | Admitting: Family Medicine

## 2020-03-25 ENCOUNTER — Encounter: Payer: Self-pay | Admitting: Family Medicine

## 2020-03-25 VITALS — BP 143/82 | HR 82 | Temp 97.4°F | Ht 66.0 in | Wt 188.4 lb

## 2020-03-25 DIAGNOSIS — G35 Multiple sclerosis: Secondary | ICD-10-CM

## 2020-03-25 DIAGNOSIS — E039 Hypothyroidism, unspecified: Secondary | ICD-10-CM | POA: Diagnosis not present

## 2020-03-25 DIAGNOSIS — E782 Mixed hyperlipidemia: Secondary | ICD-10-CM | POA: Diagnosis not present

## 2020-03-25 DIAGNOSIS — S63435A Traumatic rupture of volar plate of left ring finger at metacarpophalangeal and interphalangeal joint, initial encounter: Secondary | ICD-10-CM

## 2020-03-25 DIAGNOSIS — Z7902 Long term (current) use of antithrombotics/antiplatelets: Secondary | ICD-10-CM

## 2020-03-25 DIAGNOSIS — N183 Chronic kidney disease, stage 3 unspecified: Secondary | ICD-10-CM

## 2020-03-25 DIAGNOSIS — E119 Type 2 diabetes mellitus without complications: Secondary | ICD-10-CM

## 2020-03-25 DIAGNOSIS — I10 Essential (primary) hypertension: Secondary | ICD-10-CM

## 2020-03-25 HISTORY — DX: Traumatic rupture of volar plate of left ring finger at metacarpophalangeal and interphalangeal joint, initial encounter: S63.435A

## 2020-03-25 LAB — POCT GLYCOSYLATED HEMOGLOBIN (HGB A1C): Hemoglobin A1C: 5.9 % — AB (ref 4.0–5.6)

## 2020-03-25 MED ORDER — METFORMIN HCL 1000 MG PO TABS: 1000.0000 mg | ORAL_TABLET | Freq: Every day | ORAL | 1 refills | Status: DC

## 2020-03-25 MED ORDER — AMITRIPTYLINE HCL 100 MG PO TABS: 100.0000 mg | ORAL_TABLET | Freq: Every day | ORAL | 1 refills | Status: DC

## 2020-03-25 MED ORDER — FENOFIBRATE 160 MG PO TABS: 160.0000 mg | ORAL_TABLET | Freq: Every day | ORAL | 3 refills | Status: DC

## 2020-03-25 MED ORDER — POTASSIUM CHLORIDE ER 10 MEQ PO TBCR: 10.0000 meq | EXTENDED_RELEASE_TABLET | Freq: Every day | ORAL | 1 refills | Status: DC

## 2020-03-25 MED ORDER — GLIMEPIRIDE 2 MG PO TABS: ORAL_TABLET | ORAL | 1 refills | Status: DC

## 2020-03-25 MED ORDER — HYDROCHLOROTHIAZIDE 50 MG PO TABS: 50.0000 mg | ORAL_TABLET | Freq: Every day | ORAL | 1 refills | Status: DC

## 2020-03-25 MED ORDER — ESOMEPRAZOLE MAGNESIUM 20 MG PO CPDR: 20.0000 mg | DELAYED_RELEASE_CAPSULE | Freq: Every day | ORAL | 1 refills | Status: DC

## 2020-03-25 MED ORDER — VENLAFAXINE HCL ER 75 MG PO CP24: 225.0000 mg | ORAL_CAPSULE | Freq: Every day | ORAL | 1 refills | Status: DC

## 2020-03-25 NOTE — Patient Instructions (Signed)
a1c is great 5.9.   I have refilled the medicines we provide.  Next appt in 4 mos.

## 2020-03-25 NOTE — Progress Notes (Signed)
This visit occurred during the SARS-CoV-2 public health emergency.  Safety protocols were in place, including screening questions prior to the visit, additional usage of staff PPE, and extensive cleaning of exam room while observing appropriate contact time as indicated for disinfecting solutions.    Daisy Bennett , 1955-10-24, 64 y.o., female MRN: [id number] Patient Care Team    Relationship Specialty Notifications Start End  Ma Hillock, DO PCP - General Family Medicine  03/10/15   Josue Hector, MD PCP - Cardiology Cardiology Admissions 05/31/18   [place], Jennefer Bravo, MD Referring Physician Dermatology  02/10/16   Kathrynn Ducking, MD Consulting Physician Neurology  05/18/16   Linda Hedges, [place] Physician Obstetrics and Gynecology  06/04/16   Anell Barr, OD  Optometry  08/17/16     Chief Complaint  Patient presents with  . Follow-up     Subjective: .Daisy Bennett is a 64 y.o. female present for [facility] follow up. Diabetes/morbid obesity: She reports compliance with Amaryl 4 mg/1mg daily and metformin 1000 mg qd and she is tolerating them. She is still not checking her sugars. Patient denies dizziness, hyperglycemic or hypoglycemic events. Patient denies numbness, tingling in the extremities or nonhealing wounds of feet.  She is prescribed gabapentin 600 mg BID by her neurologist.   POCT HgB A1C-->6.5 >6.4--> 6.3>>> 7.2>>6.0>>6.0 > 6.4> 5.9 today - PSV 10/2016--> competed series -  foot exam 02/28/2019 - Eye exam reports eye exam 03/2018 UTD.  Dr. Venetia Constable, yearly eye exams encouraged>> she will schedule>>reminded 3rd time - Microalbumin: on an ARB - flu shot UTD  Hypertension/hyperlipidemia: She reports compliance  with her medication regimen of Crestor 10 mg daily, Effient 10 mg daily, losartan 100 mg daily, HCTZ 50 mg daily, fish oil 1000 mg daily, fenofibrate 160 mg daily Norvasc 10 mg, baby aspirin daily, Coreg 6.25 mg twice daily- prescribed  by her cardiologist (with the exception of HCTZ). .  H/O CAD, NSTEMI, post stent 2011 (OM)-collateralized RCA.  She follows with Dr. Johnsie Cancel. Patient denies chest pain, shortness of breath, dizziness or lower extremity edema.  Diet: low sodium Exercise: routine  depression/MS:  Patient reports she is doing well on her current regimen.  She is tolerating the Effexor at 225 mg daily and amitriptyline 100 mg nightly.  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  Feels her meds are working well for her.  She does have dry mouth from amitriptyline. She has started to work with physical therapy with her unsteady gait 2/2 to MS. She has had multiple falls with injuries.     Depression screen [facility] 2/9 03/25/2020 07/04/2019 02/15/2018 10/17/2017 06/17/2017  Decreased Interest 0 0 0 0 0  Down, Depressed, Hopeless 0 0 0 0 0  PHQ - 2 Score 0 0 0 0 0  Altered sleeping - - - 2 -  Tired, decreased energy - - - 0 -  Change in appetite - - - 1 -  Feeling bad or failure about yourself  - - - 0 -  Trouble concentrating - - - 0 -  Moving slowly or fidgety/restless - - - 0 -  Suicidal thoughts - - - 0 -  PHQ-9 Score - - - 3 -  Some recent data might be hidden    Allergies  Allergen Reactions  . Betaseron [Interferon Beta-1b] Other (See Comments)    Increased LFT's  . Provigil [Modafinil] Swelling  Tongue swelling and sores  . Topamax [Topiramate]     Cognitive slowing  . Doxycycline Other (See Comments)    Thrush,dizziness   Social History   Social History Narrative   The patient lives with husband. 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    Patient drinks occasionally, does not chew tobacco, does not use recreational drugs. She does drink occasional caffeine. She does wear her seatbelt. She does wear bike helmet riding a bike. She does exercise 3 times a week. She does not wear hearing aids or dentures. There is a smoke alarm in  her home. There are no firearms in her home. She feels safe in her relationship. She has never experienced physical abuse.   Patient sleeps 7-8 hours a night.   Patient drinks 2 cups of caffeine daily.   Patient is right handed.   Past Medical History:  Diagnosis Date  . Basal cell carcinoma   . Brain bleed ([place]) 2017  . COVID-19 08/2019  . DM (diabetes mellitus) ([place])   . Former smoker   . 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 Goiter, unspecified   . History of colonic polyps 2012  . Multiple sclerosis ([place])   . Non-ST elevated myocardial infarction (non-STEMI) ([place])   . Oral mucositis 08/31/2019  . SCCA (squamous cell carcinoma) of skin    right shin  . SEMI (subendocardial myocardial infarction) ([place]) 09/29/09   TOTAL RCA WITH STENT TO OM BRANCH  . Shingles   . Squamous cell carcinoma 2019   lower ext  . Unspecified essential hypertension   . Unspecified hypothyroidism    Past Surgical History:  Procedure Laterality Date  . CATARACT SURGERY  12/10  . CESAREAN SECTION    . CORONARY ANGIOPLASTY WITH STENT PLACEMENT  2011  . KNEE SURGERY    . leg surgery     squamous ccell removed   . TONSILLECTOMY    . VESICOVAGINAL FISTULA CLOSURE W/ TAH     Family History  Problem Relation Age of Onset  . Hypertension Mother   . Heart failure Father   . Diabetes Father   . Aneurysm Brother        THORACIC/ABD ANEURYSM  . Prostate cancer Brother 50  . Diabetes Brother   . Fibromyalgia Sister   . Brain cancer Maternal Grandmother 88  . Multiple sclerosis Neg Hx   . Colon cancer Neg Hx   . Esophageal cancer Neg Hx    Allergies as of 03/25/2020      Reactions   Betaseron [interferon Beta-1b] Other (See Comments)   Increased LFT's   Provigil [modafinil] Swelling   Tongue swelling and sores   Topamax [topiramate]    Cognitive slowing   Doxycycline Other (See Comments)   Thrush,dizziness      Medication List       Accurate as of March 25, 2020  9:13 AM. If you have  any questions, ask your nurse or doctor.        amitriptyline 100 MG tablet Commonly known as: ELAVIL Take 1 tablet (100 mg total) by mouth daily.   amLODipine 10 MG tablet Commonly known as: NORVASC Take 1 tablet (10 mg total) by mouth daily.   aspirin EC 81 MG tablet Take 1 tablet (81 mg total) daily by mouth.   carvedilol 6.25 MG tablet Commonly known as: COREG Take 1 tablet (6.25 mg total) by mouth 2 (two) times daily.   cholecalciferol 1000 units tablet Commonly known as:  VITAMIN D Take 1,000 Units by mouth daily.   Dimethyl Fumarate 120 MG Cpdr Commonly known as: Tecfidera Take 120 mg by mouth 2 (two) times daily.   esomeprazole 20 MG capsule Commonly known as: NEXIUM Take 1 capsule (20 mg total) by mouth daily at 12 noon.   fenofibrate 160 MG tablet Take 1 tablet (160 mg total) by mouth daily. What changed: additional instructions Changed by: Howard Pouch, DO   fish oil-omega-3 fatty acids 1000 MG capsule Take 1 g by mouth daily.   gabapentin 600 MG tablet Commonly known as: NEURONTIN TAKE 1 TABLET BY MOUTH TWICE A DAY   glimepiride 2 MG tablet Commonly known as: AMARYL 2 tab in the morning and  1/2 tab in the evening.   hydrochlorothiazide 50 MG tablet Commonly known as: HYDRODIURIL Take 1 tablet (50 mg total) by mouth daily.   Iron 325 (65 Fe) MG Tabs Take 325 mg by mouth daily.   levothyroxine 112 MCG tablet Commonly known as: SYNTHROID Take 1 tablet (112 mcg total) by mouth daily.   losartan 100 MG tablet Commonly known as: COZAAR TAKE 1 TABLET BY MOUTH EVERY DAY   metFORMIN 1000 MG tablet Commonly known as: GLUCOPHAGE Take 1 tablet (1,000 mg total) by mouth daily with breakfast.   nitroGLYCERIN 0.4 MG SL tablet Commonly known as: Nitrostat Place 1 tablet (0.4 mg total) under the tongue every 5 (five) minutes x 3 doses as needed for chest pain.   potassium chloride 10 MEQ tablet Commonly known as: KLOR-CON Take 1 tablet (10 mEq total)  by mouth daily.   prasugrel 10 MG Tabs tablet Commonly known as: EFFIENT TAKE 1 TABLET BY MOUTH EVERY DAY   rosuvastatin 10 MG tablet Commonly known as: CRESTOR TAKE 1 TABLET BY MOUTH DAILY. PLEASE MAKE YEARLY APPT WITH DR. Johnsie Cancel FOR OCTOBER FOR FUTURE REFILLS   venlafaxine XR 75 MG 24 hr capsule Commonly known as: Effexor XR Take 3 capsules (225 mg total) by mouth daily with breakfast.       All past medical history, surgical history, allergies, family history, immunizations andmedications were updated in the EMR today and reviewed under the history and medication portions of their EMR.     ROS: Negative, with the exception of above mentioned in HPI   Objective:  BP (!) 143/82   Pulse 82   Temp (!) 97.4 F (36.3 C) (Oral)   Ht 5\' 6"  (1.676 m)   Wt 188 lb 6.4 oz (85.5 kg)   SpO2 100%   BMI 30.41 kg/m  Body mass index is 30.41 kg/m. Gen: Afebrile. No acute distress. Nontoxic, pleasant female.  HENT: AT. [place]. No cough or shortness of breath Eyes:Pupils Equal Round Reactive to light, Extraocular movements intact,  Conjunctiva without redness, discharge or icterus. Neck/lymp/endocrine: Supple,no lymphadenopathy, no thyromegaly CV: RRR no murmur, no edema, +2/4 P posterior tibialis pulses Chest: CTAB, no wheeze or crackles Skin: no rashes, purpura or petechiae.  Neuro:  Normal gait. PERLA. EOMi. Alert. Oriented x3 Psych: Normal affect, dress and demeanor. Normal speech. Normal thought content and judgment. Diabetic Foot Exam - Simple   Simple Foot Form Diabetic Foot exam was performed with the following findings: Yes 03/25/2020  9:32 AM  Visual Inspection No deformities, no ulcerations, no other skin breakdown bilaterally: Yes Sensation Testing Intact to touch and monofilament testing bilaterally: Yes Pulse Check Posterior Tibialis and Dorsalis pulse intact bilaterally: Yes Comments       No exam data present No results found. Results  for orders placed or  performed in visit on 03/25/20 (from the past 24 hour(s))  POCT HgB A1C     Status: Abnormal   Collection Time: 03/25/20  9:04 AM  Result Value Ref Range   Hemoglobin A1C 5.9 (A) 4.0 - 5.6 %   HbA1c POC (<> result, manual entry)     HbA1c, POC (prediabetic range)     HbA1c, POC (controlled diabetic range)      Assessment/Plan: Daisy Bennett is a 64 y.o. female present for OV for [facility] Diabetes mellitus without complication (HCC)/Morbid obesity -stable.    -continue Amaryl to 4 mg in the morning and 1 mg in the evening. If next a1c < 6.0 will consider decreasing Amaryl dose.  - continue Metformin 1000 mg daily (decreased secondary to kidney function) - increase exercise regimen and continue dietary modification.  - continue gabapentin supplied by neuro  POCT HgB A1C--> 5.9 today - PSV 10/2016--> competed series -  foot exam 03/25/2020 - Eye exam reports eye exam 03/2018 UTD.  Dr. Venetia Constable, yearly eye exams encouraged>> she will schedule>>reminded 3rd time - Microalbumin: on an ARB - flu shot UTD -3-21-month follow-up  Hypertension/hyperlipidemia/Antiplatelet or antithrombotic long-term use/athersclerosis/hypokalemia/s/p stent placement: -Has been mildly above goal last 2 appts.- Continue to monitor at home if above 140/90 will need to discuss with cardio team.  - follows with cardiology, Dr. Johnsie Cancel who prescribes medications.  - low salt, increase exercise.  - continue amlodipine 10, losartan 100, coreg 6.25 mg BID> supplied by cardio    - could consider coreg increase for added coverage - continue hctz 50 mg Qd -Continue Kdur 10 meq/d -Continue fenofibrate -Continue statin - continue Effient and baby aspirin.  Cardiology feels she needs to continue both. - maintain routine cardiology follow ups.  Labs due 07/2020  Depression/hypothyroid/Multiple sclerosis (HCC)/gait instability/multiple falls/at high risk for falls Pt reports she is seeing a positive difference in  attending PT and her gait training is helpful.  - stable.   - continue  Effexor 225 mg daily - continue  gabapentin-prescribed by neurology - Tecfidera prescribed by neurology.  - continue  amitriptyline 100 mg nightly, refills provided.   Next appt 4 mos for cmc   Reviewed expectations re: course of current medical issues.  Discussed self-management of symptoms.  Outlined signs and symptoms indicating need for more acute intervention.  Patient verbalized understanding and all questions were answered.  Patient received an After-Visit Summary.    Orders Placed This Encounter  Procedures  . POCT HgB A1C   Meds ordered this encounter  Medications  . venlafaxine XR (EFFEXOR XR) 75 MG 24 hr capsule    Sig: Take 3 capsules (225 mg total) by mouth daily with breakfast.    Dispense:  270 capsule    Refill:  1  . potassium chloride (KLOR-CON) 10 MEQ tablet    Sig: Take 1 tablet (10 mEq total) by mouth daily.    Dispense:  90 tablet    Refill:  1    Do not send automatic refills.  . metFORMIN (GLUCOPHAGE) 1000 MG tablet    Sig: Take 1 tablet (1,000 mg total) by mouth daily with breakfast.    Dispense:  90 tablet    Refill:  1    DC prior scripts, dose has been changed.  Thank you  . hydrochlorothiazide (HYDRODIURIL) 50 MG tablet    Sig: Take 1 tablet (50 mg total) by mouth daily.    Dispense:  90 tablet  Refill:  1  . glimepiride (AMARYL) 2 MG tablet    Sig: 2 tab in the morning and  1/2 tab in the evening.    Dispense:  270 tablet    Refill:  1  . fenofibrate 160 MG tablet    Sig: Take 1 tablet (160 mg total) by mouth daily.    Dispense:  90 tablet    Refill:  3  . esomeprazole (NEXIUM) 20 MG capsule    Sig: Take 1 capsule (20 mg total) by mouth daily at 12 noon.    Dispense:  90 capsule    Refill:  1  . amitriptyline (ELAVIL) 100 MG tablet    Sig: Take 1 tablet (100 mg total) by mouth daily.    Dispense:  90 tablet    Refill:  1   Referral Orders  No  referral(s) requested today     Note is dictated utilizing voice recognition software. Although note has been proof read prior to signing, occasional typographical errors still can be missed. If any questions arise, please do not hesitate to call for verification.   electronically signed by:  Howard Pouch, DO  [place]

## 2020-03-26 ENCOUNTER — Other Ambulatory Visit: Payer: Self-pay

## 2020-03-26 ENCOUNTER — Ambulatory Visit (INDEPENDENT_AMBULATORY_CARE_PROVIDER_SITE_OTHER): Payer: Medicare Other | Admitting: Physical Therapy

## 2020-03-26 DIAGNOSIS — R29818 Other symptoms and signs involving the nervous system: Secondary | ICD-10-CM | POA: Diagnosis not present

## 2020-03-26 DIAGNOSIS — R2681 Unsteadiness on feet: Secondary | ICD-10-CM

## 2020-03-26 DIAGNOSIS — R2689 Other abnormalities of gait and mobility: Secondary | ICD-10-CM

## 2020-03-26 DIAGNOSIS — M6281 Muscle weakness (generalized): Secondary | ICD-10-CM | POA: Diagnosis not present

## 2020-03-26 NOTE — Therapy (Signed)
[place]  [place], [place], [address] Phone: [phone]   Fax:  [phone]  Physical Therapy Treatment  Patient Details  Name: Daisy Bennett MRN: [id number] Date of Birth: 1955/08/27 Referring Provider (PT): Howard Pouch, DO   Encounter Date: 03/26/2020   PT End of Session - 03/26/20 0929    Visit Number 4    Number of Visits 12    Date for PT Re-Evaluation 04/29/20    Authorization Type medicare/, [employer]    PT Start Time 0930    PT Stop Time 1010    PT Time Calculation (min) 40 min    Activity Tolerance Patient tolerated treatment well    Behavior During Therapy [name] for tasks assessed/performed           Past Medical History:  Diagnosis Date  . Basal cell carcinoma   . Brain bleed ([place]) 2017  . COVID-19 08/2019  . DM (diabetes mellitus) ([place])   . Former smoker   . 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 Goiter, unspecified   . History of colonic polyps 2012  . Multiple sclerosis ([place])   . Non-ST elevated myocardial infarction (non-STEMI) ([place])   . Oral mucositis 08/31/2019  . SCCA (squamous cell carcinoma) of skin    right shin  . SEMI (subendocardial myocardial infarction) ([place]) 09/29/09   TOTAL RCA WITH STENT TO OM BRANCH  . Shingles   . Squamous cell carcinoma 2019   lower ext  . Unspecified essential hypertension   . Unspecified hypothyroidism     Past Surgical History:  Procedure Laterality Date  . CATARACT SURGERY  12/10  . CESAREAN SECTION    . CORONARY ANGIOPLASTY WITH STENT PLACEMENT  2011  . KNEE SURGERY    . leg surgery     squamous ccell removed   . TONSILLECTOMY    . VESICOVAGINAL FISTULA CLOSURE W/ TAH      There were no vitals filed for this visit.   Subjective Assessment - 03/26/20 0934    Subjective Pt reports she didn't do any exercises yesterday because she was exhausted.  She plans to get out of town this afternoon and do some kayaking.    Pertinent  History MS (diagnosed 25 years ago per report), subarachnoid hemorrhage, hypothyroidism, cateracts with multi-focal lens (trips going up steps)    Patient Stated Goals "I don't know that we can do anything."  She wonders if someone else would catch themselves.    Currently in Pain? No/denies    Pain Score 0-No pain              [facility] PT Assessment - 03/26/20 0001      Assessment   Medical Diagnosis MS, gait instability, multiple falls    Referring Provider (PT) Howard Pouch, DO    Onset Date/Surgical Date 03/04/20    Hand Dominance Right    Prior Therapy therapy for finger at another clinic            [facility] Adult PT Treatment/Exercise - 03/26/20 0001      Knee/Hip Exercises: Stretches   Quad Stretch Right;Left;1 rep;30 seconds    Gastroc Stretch Right;Left;2 reps;30 seconds      Knee/Hip Exercises: Aerobic   Recumbent Bike L5: 4.5 min       Knee/Hip Exercises: Standing   Lateral Step Up Right;1 set;10 reps;Hand Hold: 0;Hand Hold: 1   straddling 8" step   Forward Step Up Right;Left;1 set;15 reps;Hand Hold: 1;Step Height: 8"  Forward Step Up Limitations requires LUE support on rail with RLE on step, no UE for LUE ascending .    SLS Rt/Lt SLS on mini trampoline x 20 sec x 3 reps     Other Standing Knee Exercises standing to high kneeling (on 3" pad) x 2 reps each leg forward with minimal use of UE     Other Standing Knee Exercises split squats with limited depth x 5 reps each leg forward.  Single leg high kneeling with perterbations (not challenging)           Balance Exercises - 03/26/20 0001      Balance Exercises: Standing   Standing Eyes Opened Narrow base of support (BOS);Foam/compliant surface;2 reps;20 secs    Standing Eyes Closed Wide (BOA);Narrow base of support (BOS);Foam/compliant surface;3 reps;10 secs    Tandem Stance Eyes open;Foam/compliant surface;2 reps;20 secs           Small piece of sensitive skin rock tape applied with 15% stretch over bruise  on mid-lateral Rt forearm to assist with bruise reduction. Pt instructed on safe removal technique; verbalized understanding.        PT Long Term Goals - 03/18/20 1219      PT LONG TERM GOAL #1   Title The patient will be independent with HEP for multi-sensory balance, mobility, and balance strategies.    Time 6    Period Weeks    Target Date 04/29/20      PT LONG TERM GOAL #2   Title The patient will improve Berg balance score from 43/56 to > or equal to 48/56 demonstrating dec'd risk for falls.    Time 6    Period Weeks    Target Date 04/29/20      PT LONG TERM GOAL #3   Title The patient will improve FGA from 14/30 to > or equal to 22/30 to demo dec'ing risk for falls.    Time 6    Period Weeks    Target Date 04/29/20      PT LONG TERM GOAL #4   Title The patient will move floor<>stand witout UE support due to h/o falls.    Time 6    Period Weeks    Target Date 04/29/20      PT LONG TERM GOAL #5   Title The patient will verbalize strategies to reduce fall risk in home/community (focus on multi-tasking, awareness of fall hazards in environment, etc).    Time 6    Period Weeks    Target Date 04/29/20                 Plan - 03/26/20 1033    Clinical Impression Statement Pt's RLE fatigues quicker than LLE with standing exercises.  RLE single leg balance more challenging than LLE.  Pt able to get down to/from floor with minimal to no UE support today; has met LTG #4.    Comorbidities MS, subarachnoic hemorrhage    Examination-Participation Restrictions Community Activity    Stability/Clinical Decision Making Stable/Uncomplicated    Rehab Potential Good    PT Frequency 2x / week    PT Duration 6 weeks    PT Treatment/Interventions ADLs/Self Care Home Management;Aquatic Therapy;Gait training;Stair training;Functional mobility training;Therapeutic activities;Therapeutic exercise;Balance training;Neuromuscular re-education;Manual techniques;Taping;Patient/family  education    PT Next Visit Plan add work on hip and core strength, focus on balance strategies (hip, ankle, stepping), compliant surface negotiation, dynamic gait/balance    PT Home Exercise Plan Access Code: [email or web address]  Consulted and Agree with Plan of Care Patient           Patient will benefit from skilled therapeutic intervention in order to improve the following deficits and impairments:  Decreased balance, Decreased strength, Impaired flexibility, Postural dysfunction, Decreased activity tolerance, Abnormal gait, Difficulty walking  Visit Diagnosis: Other abnormalities of gait and mobility  Muscle weakness (generalized)  Unsteadiness on feet  Other symptoms and signs involving the nervous system     Problem List Patient Active Problem List   Diagnosis Date Noted  . Dry mouth 11/23/2019  . Oral mucositis 08/31/2019  . Chronic kidney disease (CKD), stage III (moderate) 07/05/2019  . 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 Olecranon bursitis of left elbow 10/17/2017  . Memory deficit 06/17/2017  . Controlled type 2 diabetes mellitus without complication, without long-term current use of insulin ([place]) 02/14/2017  . Antiplatelet or antithrombotic long-term use 05/18/2016  . Subdural hematoma ([place]) 05/18/2016  . Morbid obesity ([place]) 05/12/2016  . History of colon polyps 12/15/2015  . Tobacco abuse counseling 03/20/2015  . Mixed hyperlipidemia 10/23/2009  . Coronary atherosclerosis 10/23/2009  . CARDIAC MURMUR 10/23/2009  . Hypothyroidism 10/15/2009  . Multiple sclerosis ([place]) 10/15/2009  . Essential hypertension 10/15/2009   Kerin Perna, PTA 03/26/20 10:42 AM  [place], [place], [address] Phone: [phone]   Fax:  [phone]  Name: Daisy Bennett MRN: [id number] Date of Birth: [birth year]

## 2020-03-27 ENCOUNTER — Encounter: Payer: Medicare Other | Admitting: Rehabilitative and Restorative Service Providers"

## 2020-03-31 ENCOUNTER — Encounter: Payer: Medicare Other | Admitting: Rehabilitative and Restorative Service Providers"

## 2020-03-31 ENCOUNTER — Other Ambulatory Visit: Payer: Self-pay | Admitting: Orthopedic Surgery

## 2020-03-31 DIAGNOSIS — S63439A Traumatic rupture of volar plate of unspecified finger at metacarpophalangeal and interphalangeal joint, initial encounter: Secondary | ICD-10-CM

## 2020-04-01 ENCOUNTER — Encounter: Payer: Medicare Other | Admitting: Rehabilitative and Restorative Service Providers"

## 2020-04-08 ENCOUNTER — Encounter: Payer: Medicare Other | Admitting: Physical Therapy

## 2020-04-09 ENCOUNTER — Ambulatory Visit
Admission: RE | Admit: 2020-04-09 | Discharge: 2020-04-09 | Disposition: A | Discharge status: Home / Self Care | Payer: Medicare Other | Source: Ambulatory Visit | Attending: Orthopedic Surgery | Admitting: Orthopedic Surgery

## 2020-04-09 DIAGNOSIS — S63439A Traumatic rupture of volar plate of unspecified finger at metacarpophalangeal and interphalangeal joint, initial encounter: Secondary | ICD-10-CM

## 2020-04-15 ENCOUNTER — Other Ambulatory Visit: Payer: Self-pay

## 2020-04-15 ENCOUNTER — Ambulatory Visit (INDEPENDENT_AMBULATORY_CARE_PROVIDER_SITE_OTHER): Payer: Medicare Other | Admitting: Rehabilitative and Restorative Service Providers"

## 2020-04-15 DIAGNOSIS — R2689 Other abnormalities of gait and mobility: Secondary | ICD-10-CM

## 2020-04-15 DIAGNOSIS — R2681 Unsteadiness on feet: Secondary | ICD-10-CM | POA: Diagnosis not present

## 2020-04-15 DIAGNOSIS — R29818 Other symptoms and signs involving the nervous system: Secondary | ICD-10-CM

## 2020-04-15 DIAGNOSIS — M6281 Muscle weakness (generalized): Secondary | ICD-10-CM | POA: Diagnosis not present

## 2020-04-15 NOTE — Therapy (Signed)
[place], [place], [address] Phone: [phone]   Fax:  [phone]  Physical Therapy Treatment and Discharge Summary   Patient Details  Name: Daisy Bennett MRN: [id number] Date of Birth: [birth year] Referring Provider (PT): Howard Pouch, DO   Encounter Date: 04/15/2020   PT End of Session - 04/15/20 1024    Visit Number 5    Number of Visits 12    Date for PT Re-Evaluation 04/29/20    Authorization Type medicare/, A[employer]    PT Start Time 1019    PT Stop Time 1100    PT Time Calculation (min) 41 min    Activity Tolerance Patient tolerated treatment well    Behavior During Therapy [facility] for tasks assessed/performed           Past Medical History:  Diagnosis Date  . Basal cell carcinoma   . Brain bleed ([place]) 2017  . COVID-19 08/2019  . DM (diabetes mellitus) ([place])   . Former smoker   . 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 Goiter, unspecified   . History of colonic polyps 2012  . Multiple sclerosis ([place])   . Non-ST elevated myocardial infarction (non-STEMI) ([place])   . Oral mucositis 08/31/2019  . SCCA (squamous cell carcinoma) of skin    right shin  . SEMI (subendocardial myocardial infarction) ([place]) 09/29/09   TOTAL RCA WITH STENT TO OM BRANCH  . Shingles   . Squamous cell carcinoma 2019   lower ext  . Unspecified essential hypertension   . Unspecified hypothyroidism     Past Surgical History:  Procedure Laterality Date  . CATARACT SURGERY  12/10  . CESAREAN SECTION    . CORONARY ANGIOPLASTY WITH STENT PLACEMENT  2011  . KNEE SURGERY    . leg surgery     squamous ccell removed   . TONSILLECTOMY    . VESICOVAGINAL FISTULA CLOSURE W/ TAH      There were no vitals filed for this visit.   Subjective Assessment - 04/15/20 1023    Subjective The patient reports she has caught herself multiple times to avoid a fall.  She had one near fall going up the steps when her foot  caught.    Pertinent History MS (diagnosed 25 years ago per report), subarachnoid hemorrhage, hypothyroidism, cateracts with multi-focal lens (trips going up steps)    Patient Stated Goals "I don't know that we can do anything."  She wonders if someone else would catch themselves.    Currently in Pain? No/denies              [facility] PT Assessment - 04/15/20 1044      Assessment   Medical Diagnosis MS, gait instability, multiple falls    Referring Provider (PT) Howard Pouch, DO    Onset Date/Surgical Date 03/04/20    Hand Dominance Right      Berg Balance Test   Sit to Stand Able to stand without using hands and stabilize independently    Standing Unsupported Able to stand safely 2 minutes    Sitting with Back Unsupported but Feet Supported on Floor or Stool Able to sit safely and securely 2 minutes    Stand to Sit Sits safely with minimal use of hands    Transfers Able to transfer safely, minor use of hands    Standing Unsupported with Eyes Closed Able to stand 10 seconds safely    Standing Unsupported with Feet Together Able to place feet together independently  and stand 1 minute safely    From Standing, Reach Forward with Outstretched Arm Can reach confidently >25 cm (10")    From Standing Position, Pick up Object from [place] to pick up shoe safely and easily    From Standing Position, Turn to Look Behind Over each Shoulder Looks behind from both sides and weight shifts well    Turn 360 Degrees Able to turn 360 degrees safely in 4 seconds or less    Standing Unsupported, Alternately Place Feet on Step/Stool Able to stand independently and safely and complete 8 steps in 20 seconds    Standing Unsupported, One Foot in North to place foot tandem independently and hold 30 seconds    Standing on One Leg Able to lift leg independently and hold > 10 seconds    Total Score 56    Berg comment: 56/56       Functional Gait  Assessment   Gait assessed  Yes    Gait Level Surface  Walks 20 ft in less than 7 sec but greater than 5.5 sec, uses assistive device, slower speed, mild gait deviations, or deviates 6-10 in outside of the 12 in walkway width.    Change in Gait Speed Able to smoothly change walking speed without loss of balance or gait deviation. Deviate no more than 6 in outside of the 12 in walkway width.    Gait with Horizontal Head Turns Performs head turns smoothly with slight change in gait velocity (eg, minor disruption to smooth gait path), deviates 6-10 in outside 12 in walkway width, or uses an assistive device.    Gait with Vertical Head Turns Performs head turns with no change in gait. Deviates no more than 6 in outside 12 in walkway width.    Gait and Pivot Turn Pivot turns safely within 3 sec and stops quickly with no loss of balance.    Step Over Obstacle Is able to step over one shoe box (4.5 in total height) without changing gait speed. No evidence of imbalance.    Gait with Narrow Base of Support Is able to ambulate for 10 steps heel to toe with no staggering.    Gait with Eyes Closed Walks 20 ft, uses assistive device, slower speed, mild gait deviations, deviates 6-10 in outside 12 in walkway width. Ambulates 20 ft in less than 9 sec but greater than 7 sec.    Ambulating Backwards Walks 20 ft, uses assistive device, slower speed, mild gait deviations, deviates 6-10 in outside 12 in walkway width.    Steps Two feet to a stair, must use rail.    Total Score 23    FGA comment: 23/30                         [facility] Adult PT Treatment/Exercise - 04/15/20 1034      Therapeutic Activites    Therapeutic Activities Other Therapeutic Activities      Neuro Re-ed    Neuro Re-ed Details  Reviewed HEP including compliant standing with eyes closed bringing feet to more narrow; head motion on solid surface and then on pillow surface with eyes open;  single leg stance and sidestepping; tandem gait forward/backward      Exercises   Exercises Ankle       Ankle Exercises: Standing   Heel Raises Right;Left;10 reps    Heel Raises Limitations unilateral stance  PT Education - 04/15/20 1331    Education Details HEP    Person(s) Educated Patient    Methods Explanation;Demonstration;Handout    Comprehension Verbalized understanding;Returned demonstration               PT Long Term Goals - 04/15/20 1044      PT LONG TERM GOAL #1   Title The patient will be independent with HEP for multi-sensory balance, mobility, and balance strategies.    Time 6    Period Weeks    Status Achieved      PT LONG TERM GOAL #2   Title The patient will improve Berg balance score from 43/56 to > or equal to 48/56 demonstrating dec'd risk for falls.    Baseline 56/56    Time 6    Period Weeks    Status Achieved      PT LONG TERM GOAL #3   Title The patient will improve FGA from 14/30 to > or equal to 22/30 to demo dec'ing risk for falls.    Baseline 23/30 on 04/15/20    Time 6    Period Weeks    Status Achieved      PT LONG TERM GOAL #4   Title The patient will move floor<>stand witout UE support due to h/o falls.    Time 6    Period Weeks    Status Achieved      PT LONG TERM GOAL #5   Title The patient will verbalize strategies to reduce fall risk in home/community (focus on multi-tasking, awareness of fall hazards in environment, etc).    Time 6    Period Weeks    Status Achieved                 Plan - 04/15/20 1332    Clinical Impression Statement The patient missed a couple of weeks of PT due to her husband being sick and paitent having concerns it could be covid.  She returns today reporting continued improvement in strength and balance.  She met LTGs and PT and patient discussed discharge with a plan to continue current HEP and find ongoing exercise to maintain benefits.    Comorbidities MS, subarachnoic hemorrhage    Examination-Participation Restrictions Community Activity    Stability/Clinical  Decision Making Stable/Uncomplicated    Rehab Potential Good    PT Frequency 2x / week    PT Duration 6 weeks    PT Treatment/Interventions ADLs/Self Care Home Management;Aquatic Therapy;Gait training;Stair training;Functional mobility training;Therapeutic activities;Therapeutic exercise;Balance training;Neuromuscular re-education;Manual techniques;Taping;Patient/family education    PT Next Visit Plan discharge    PT Home Exercise Plan Access Code: [email or web address]    Consulted and Agree with Plan of Care Patient           Patient will benefit from skilled therapeutic intervention in order to improve the following deficits and impairments:  Decreased balance, Decreased strength, Impaired flexibility, Postural dysfunction, Decreased activity tolerance, Abnormal gait, Difficulty walking  Visit Diagnosis: Other abnormalities of gait and mobility  Muscle weakness (generalized)  Unsteadiness on feet  Other symptoms and signs involving the nervous system     Problem List Patient Active Problem List   Diagnosis Date Noted  . Dry mouth 11/23/2019  . Oral mucositis 08/31/2019  . Chronic kidney disease (CKD), stage III (moderate) 07/05/2019  . 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 Olecranon bursitis of left elbow 10/17/2017  . Memory deficit 06/17/2017  . Controlled type 2 diabetes mellitus without complication,  without long-term current use of insulin ([place]) 02/14/2017  . Antiplatelet or antithrombotic long-term use 05/18/2016  . Subdural hematoma ([place]) 05/18/2016  . Morbid obesity ([place]) 05/12/2016  . History of colon polyps 12/15/2015  . Tobacco abuse counseling 03/20/2015  . Mixed hyperlipidemia 10/23/2009  . Coronary atherosclerosis 10/23/2009  . CARDIAC MURMUR 10/23/2009  . Hypothyroidism 10/15/2009  . Multiple sclerosis ([place]) 10/15/2009  . Essential hypertension 10/15/2009    PHYSICAL THERAPY DISCHARGE SUMMARY  Visits from Start of Care:  5  Current functional level related to goals / functional outcomes: See above   Remaining deficits: Patient with chronic condition impacting mobility. Currently reporting significant improvement.   Education / Equipment: HEP, home safety.  Plan: Patient agrees to discharge.  Patient goals were met. Patient is being discharged due to meeting the stated rehab goals.  ?????         Thank you for the referral of this patient. Rudell Cobb, MPT  [place] , PT 04/15/2020, 1:33 PM  [facility] [place], [place], [address] Phone: [phone]   Fax:  [phone]  Name: YISELL SPRUNGER MRN: [id number] Date of Birth: 17-Mar-1956

## 2020-04-15 NOTE — Patient Instructions (Signed)
Access Code: [email or web address] URL: [email or web address] Date: 04/15/2020 Prepared by: Rudell Cobb  Exercises Romberg Stance Eyes Closed on Foam Pad - 2 x daily - 7 x weekly - 1 sets - 10 reps Romberg Stance on Foam Pad with Head Rotation - 2 x daily - 7 x weekly - 1 sets - 10 reps Single Leg Heel Raise with Counter Support - 2 x daily - 7 x weekly - 1 sets - 10 reps Standing Single Leg Stance with Counter Support - 1 x daily - 7 x weekly - 1 sets - 2 reps - 15-30 seconds hold Sideways Walking - 2 x daily - 7 x weekly - 1 sets - 10 reps Tandem Walking with Counter Support - 1 x daily - 7 x weekly - 1 sets - 3 reps Backward Tandem Walking with Counter Support - 1 x daily - 7 x weekly - 1 sets - 3 reps

## 2020-04-24 ENCOUNTER — Other Ambulatory Visit: Payer: Self-pay | Admitting: Neurology

## 2020-05-01 LAB — HM DIABETES EYE EXAM

## 2020-05-26 ENCOUNTER — Ambulatory Visit: Payer: Medicare Other | Admitting: Neurology

## 2020-06-05 ENCOUNTER — Ambulatory Visit (INDEPENDENT_AMBULATORY_CARE_PROVIDER_SITE_OTHER): Payer: Medicare Other

## 2020-06-05 ENCOUNTER — Encounter: Payer: Self-pay | Admitting: Family Medicine

## 2020-06-05 ENCOUNTER — Other Ambulatory Visit: Payer: Self-pay

## 2020-06-05 DIAGNOSIS — Z23 Encounter for immunization: Secondary | ICD-10-CM | POA: Diagnosis not present

## 2020-07-09 ENCOUNTER — Ambulatory Visit (INDEPENDENT_AMBULATORY_CARE_PROVIDER_SITE_OTHER): Payer: Medicare Other | Admitting: Family Medicine

## 2020-07-09 ENCOUNTER — Other Ambulatory Visit: Payer: Self-pay

## 2020-07-09 ENCOUNTER — Encounter: Payer: Self-pay | Admitting: Family Medicine

## 2020-07-09 VITALS — BP 135/77 | HR 79 | Temp 98.3°F | Ht 66.5 in | Wt 190.0 lb

## 2020-07-09 DIAGNOSIS — N183 Chronic kidney disease, stage 3 unspecified: Secondary | ICD-10-CM

## 2020-07-09 DIAGNOSIS — I10 Essential (primary) hypertension: Secondary | ICD-10-CM

## 2020-07-09 DIAGNOSIS — E782 Mixed hyperlipidemia: Secondary | ICD-10-CM | POA: Diagnosis not present

## 2020-07-09 DIAGNOSIS — G35 Multiple sclerosis: Secondary | ICD-10-CM

## 2020-07-09 DIAGNOSIS — E039 Hypothyroidism, unspecified: Secondary | ICD-10-CM

## 2020-07-09 DIAGNOSIS — E119 Type 2 diabetes mellitus without complications: Secondary | ICD-10-CM | POA: Diagnosis not present

## 2020-07-09 DIAGNOSIS — I25118 Atherosclerotic heart disease of native coronary artery with other forms of angina pectoris: Secondary | ICD-10-CM

## 2020-07-09 DIAGNOSIS — Z7902 Long term (current) use of antithrombotics/antiplatelets: Secondary | ICD-10-CM

## 2020-07-09 LAB — CBC WITH DIFFERENTIAL/PLATELET
Basophils Absolute: 0 10*3/uL (ref 0.0–0.1)
Basophils Relative: 0.4 % (ref 0.0–3.0)
Eosinophils Absolute: 0.2 10*3/uL (ref 0.0–0.7)
Eosinophils Relative: 1.6 % (ref 0.0–5.0)
HCT: 37.3 % (ref 36.0–46.0)
Hemoglobin: 12.6 g/dL (ref 12.0–15.0)
Lymphocytes Relative: 3.9 % — ABNORMAL LOW (ref 12.0–46.0)
Lymphs Abs: 0.4 10*3/uL — ABNORMAL LOW (ref 0.7–4.0)
MCHC: 33.8 g/dL (ref 30.0–36.0)
MCV: 98.4 fl (ref 78.0–100.0)
Monocytes Absolute: 0.8 10*3/uL (ref 0.1–1.0)
Monocytes Relative: 7 % (ref 3.0–12.0)
Neutro Abs: 9.7 10*3/uL — ABNORMAL HIGH (ref 1.4–7.7)
Neutrophils Relative %: 87.1 % — ABNORMAL HIGH (ref 43.0–77.0)
Platelets: 155 10*3/uL (ref 150.0–400.0)
RBC: 3.79 Mil/uL — ABNORMAL LOW (ref 3.87–5.11)
RDW: 12.8 % (ref 11.5–15.5)
WBC: 11.2 10*3/uL — ABNORMAL HIGH (ref 4.0–10.5)

## 2020-07-09 LAB — COMPREHENSIVE METABOLIC PANEL
ALT: 18 U/L (ref 0–35)
AST: 20 U/L (ref 0–37)
Albumin: 4.6 g/dL (ref 3.5–5.2)
Alkaline Phosphatase: 77 U/L (ref 39–117)
BUN: 19 mg/dL (ref 6–23)
CO2: 32 mEq/L (ref 19–32)
Calcium: 9.7 mg/dL (ref 8.4–10.5)
Chloride: 98 mEq/L (ref 96–112)
Creatinine, Ser: 0.98 mg/dL (ref 0.40–1.20)
GFR: 60.32 mL/min (ref >60.00)
Glucose, Bld: 150 mg/dL — ABNORMAL HIGH (ref 70–99)
Potassium: 3.5 mEq/L (ref 3.5–5.1)
Sodium: 139 mEq/L (ref 135–145)
Total Bilirubin: 0.4 mg/dL (ref 0.2–1.2)
Total Protein: 7.1 g/dL (ref 6.0–8.3)

## 2020-07-09 LAB — LDL CHOLESTEROL, DIRECT: Direct LDL: 77 mg/dL

## 2020-07-09 LAB — POCT GLYCOSYLATED HEMOGLOBIN (HGB A1C)
HbA1c POC (<> result, manual entry): 6.1 % (ref 4.0–5.6)
HbA1c, POC (controlled diabetic range): 6.1 % (ref 0.0–7.0)
HbA1c, POC (prediabetic range): 6.1 % (ref 5.7–6.4)
Hemoglobin A1C: 6.1 % — AB (ref 4.0–5.6)

## 2020-07-09 LAB — LIPID PANEL
Cholesterol: 160 mg/dL (ref 0–200)
HDL: 53.1 mg/dL (ref >39.00)
NonHDL: 107.22
Total CHOL/HDL Ratio: 3
Triglycerides: 238 mg/dL — ABNORMAL HIGH (ref 0.0–149.0)
VLDL: 47.6 mg/dL — ABNORMAL HIGH (ref 0.0–40.0)

## 2020-07-09 LAB — VITAMIN D 25 HYDROXY (VIT D DEFICIENCY, FRACTURES): VITD: 35.52 ng/mL (ref 30.00–100.00)

## 2020-07-09 LAB — TSH: TSH: 2.3 u[IU]/mL (ref 0.35–4.50)

## 2020-07-09 MED ORDER — VENLAFAXINE HCL ER 75 MG PO CP24
225.0000 mg | ORAL_CAPSULE | Freq: Every day | ORAL | 1 refills | Status: DC
Start: 2020-07-09 — End: 2020-10-28

## 2020-07-09 MED ORDER — AMITRIPTYLINE HCL 100 MG PO TABS
100.0000 mg | ORAL_TABLET | Freq: Every day | ORAL | 1 refills | Status: DC
Start: 2020-07-09 — End: 2020-10-27

## 2020-07-09 MED ORDER — CARVEDILOL 12.5 MG PO TABS
12.5000 mg | ORAL_TABLET | Freq: Two times a day (BID) | ORAL | 1 refills | Status: DC
Start: 2020-07-09 — End: 2020-12-31

## 2020-07-09 MED ORDER — METFORMIN HCL 1000 MG PO TABS: 1000.0000 mg | ORAL_TABLET | Freq: Every day | ORAL | 1 refills | Status: DC

## 2020-07-09 MED ORDER — ESOMEPRAZOLE MAGNESIUM 20 MG PO CPDR
20.0000 mg | DELAYED_RELEASE_CAPSULE | Freq: Every day | ORAL | 1 refills | Status: DC
Start: 2020-07-09 — End: 2020-09-30

## 2020-07-09 MED ORDER — HYDROCHLOROTHIAZIDE 50 MG PO TABS
50.0000 mg | ORAL_TABLET | Freq: Every day | ORAL | 1 refills | Status: DC
Start: 2020-07-09 — End: 2020-10-28

## 2020-07-09 MED ORDER — POTASSIUM CHLORIDE ER 10 MEQ PO TBCR
10.0000 meq | EXTENDED_RELEASE_TABLET | Freq: Every day | ORAL | 1 refills | Status: DC
Start: 2020-07-09 — End: 2020-10-28

## 2020-07-09 MED ORDER — GLIMEPIRIDE 2 MG PO TABS: ORAL_TABLET | ORAL | 1 refills | Status: DC

## 2020-07-09 NOTE — Progress Notes (Signed)
This visit occurred during the SARS-CoV-2 public health emergency.  Safety protocols were in place, including screening questions prior to the visit, additional usage of staff PPE, and extensive cleaning of exam room while observing appropriate contact time as indicated for disinfecting solutions.    Daisy Bennett , June 30, 1956, 64 y.o., female MRN: [id number] Patient Care Team    Relationship Specialty Notifications Start End  Ma Hillock, DO PCP - General Family Medicine  03/10/15   Josue Hector, MD PCP - Cardiology Cardiology Admissions 05/31/18   [place], Jennefer Bravo, MD Referring Physician Dermatology  02/10/16   Kathrynn Ducking, MD Consulting Physician Neurology  05/18/16   Linda Hedges, [place] Physician Obstetrics and Gynecology  06/04/16   Anell Barr, OD  Optometry  08/17/16     Chief Complaint  Patient presents with  . Follow-up    CMC; pt is fasting     Subjective: .Daisy Bennett is a 64 y.o. female present for [facility] follow up. Diabetes/morbid obesity: She reports compliance with Amaryl 4 mg/80mdaily and metformin 1000 mg qd and she is tolerating them. She is still not checking her sugars. Patient denies dizziness, hyperglycemic or hypoglycemic events. Patient denies numbness, tingling in the extremities or nonhealing wounds of feet.  She is prescribed gabapentin 600 mg BID by her neurologist.   POCT HgB A1C-->6.5 >6.4--> 6.3>>> 7.2>>6.0>>6.0 > 6.4> 5.9 today - PSV 10/2016--> competed series -  foot exam 02/28/2019 - Eye exam reports eye exam 03/2018 UTD.  Dr. RVenetia Constable yearly eye exams encouraged>> she will schedule>>reminded 3rd time - Microalbumin: on an ARB - flu shot UTD  Hypertension/hyperlipidemia: She reports compliance with her medication regimen of Crestor 10 mg daily, Effient 10 mg daily, losartan 100 mg daily, HCTZ 50 mg daily, fish oil 1000 mg daily, fenofibrate 160 mg daily Norvasc 10 mg, baby aspirin daily, Coreg 6.25 mg  twice daily- prescribed by her cardiologist (with the exception of HCTZ). .  H/O CAD, NSTEMI, post stent 2011 ([place])-collateralized RCA.  She follows with Dr. NJohnsie CancelPatient denies chest pain, shortness of breath, dizziness or lower extremity edema.  Diet: low sodium Exercise: routine  depression/MS:  Patient reports she is doing well on her current regimen.  She is compliant  with Effexor at 225 mg daily and amitriptyline 100 mg nightly.  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  Feels her meds are working well for her.  She does have dry mouth from amitriptyline. She has started to work with physical therapy with her unsteady gait 2/2 to MS. She has had multiple falls with injuries.     Dep[facility] 07/09/2020 03/25/2020 07/04/2019 02/15/2018 10/17/2017  Decreased Interest 0 0 0 0 0  Down, Depressed, Hopeless 0 0 0 0 0  PHQ - 2 Score 0 0 0 0 0  Altered sleeping - - - - 2  Tired, decreased energy - - - - 0  Change in appetite - - - - 1  Feeling bad or failure about yourself  - - - - 0  Trouble concentrating - - - - 0  Moving slowly or fidgety/restless - - - - 0  Suicidal thoughts - - - - 0  PHQ-9 Score - - - - 3  Some recent data might be hidden    Allergies  Allergen Reactions  . Betaseron [Interferon Beta-1b] Other (See Comments)    Increased  LFT's  . Provigil [Modafinil] Swelling    Tongue swelling and sores  . Topamax [Topiramate]     Cognitive slowing  . Doxycycline Other (See Comments)    Thrush,dizziness   Social History   Social History Narrative   The patient lives with husband. 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    Patient drinks occasionally, does not chew tobacco, does not use recreational drugs. She does drink occasional caffeine. She does wear her seatbelt. She does wear bike helmet riding a bike. She does exercise 3 times a week. She does not wear hearing aids or dentures. There  is a smoke alarm in her home. There are no firearms in her home. She feels safe in her relationship. She has never experienced physical abuse.   Patient sleeps 7-8 hours a night.   Patient drinks 2 cups of caffeine daily.   Patient is right handed.   Past Medical History:  Diagnosis Date  . Basal cell carcinoma   . Brain bleed ([place]) 2017  . COVID-19 08/2019  . DM (diabetes mellitus) ([place])   . Former smoker   . 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 Goiter, unspecified   . History of colonic polyps 2012  . Multiple sclerosis ([place])   . Non-ST elevated myocardial infarction (non-STEMI) ([place])   . Oral mucositis 08/31/2019  . Oral mucositis 08/31/2019  . SCCA (squamous cell carcinoma) of skin    right shin  . SEMI (subendocardial myocardial infarction) ([place]) 09/29/09   TOTAL RCA WITH STENT TO OM BRANCH  . Shingles   . Squamous cell carcinoma 2019   lower ext  . Unspecified essential hypertension   . Unspecified hypothyroidism    Past Surgical History:  Procedure Laterality Date  . CATARACT SURGERY  12/10  . CESAREAN SECTION    . CORONARY ANGIOPLASTY WITH STENT PLACEMENT  2011  . KNEE SURGERY    . leg surgery     squamous ccell removed   . TONSILLECTOMY    . VESICOVAGINAL FISTULA CLOSURE W/ TAH     Family History  Problem Relation Age of Onset  . Hypertension Mother   . Heart failure Father   . Diabetes Father   . Aneurysm Brother        THORACIC/ABD ANEURYSM  . Prostate cancer Brother 19  . Diabetes Brother   . Fibromyalgia Sister   . Brain cancer Maternal Grandmother 6  . Multiple sclerosis Neg Hx   . Colon cancer Neg Hx   . Esophageal cancer Neg Hx    Allergies as of 07/09/2020      Reactions   Betaseron [interferon Beta-1b] Other (See Comments)   Increased LFT's   Provigil [modafinil] Swelling   Tongue swelling and sores   Topamax [topiramate]    Cognitive slowing   Doxycycline Other (See Comments)   Thrush,dizziness      Medication List        Accurate as of July 09, 2020  8:39 AM. If you have any questions, ask your nurse or doctor.        amitriptyline 100 MG tablet Commonly known as: ELAVIL Take 1 tablet (100 mg total) by mouth daily.   amLODipine 10 MG tablet Commonly known as: NORVASC Take 1 tablet (10 mg total) by mouth daily.   aspirin EC 81 MG tablet Take 1 tablet (81 mg total) daily by mouth.   B COMPLEX-B12 PO Take by mouth.   carvedilol 12.5 MG tablet Commonly known as:  COREG Take 1 tablet (12.5 mg total) by mouth 2 (two) times daily. What changed:   medication strength  how much to take Changed by: Howard Pouch, DO   cholecalciferol 1000 units tablet Commonly known as: VITAMIN D Take 1,000 Units by mouth daily.   Dimethyl Fumarate 120 MG Cpdr Commonly known as: Tecfidera Take 120 mg by mouth 2 (two) times daily.   esomeprazole 20 MG capsule Commonly known as: NEXIUM Take 1 capsule (20 mg total) by mouth daily at 12 noon.   fenofibrate 160 MG tablet Take 1 tablet (160 mg total) by mouth daily.   fish oil-omega-3 fatty acids 1000 MG capsule Take 1 g by mouth daily.   gabapentin 600 MG tablet Commonly known as: NEURONTIN TAKE 1 TABLET BY MOUTH TWICE A DAY   glimepiride 2 MG tablet Commonly known as: AMARYL 2 tab in the morning and  1/2 tab in the evening.   hydrochlorothiazide 50 MG tablet Commonly known as: HYDRODIURIL Take 1 tablet (50 mg total) by mouth daily.   Iron 325 (65 Fe) MG Tabs Take 325 mg by mouth daily.   levothyroxine 112 MCG tablet Commonly known as: SYNTHROID Take 1 tablet (112 mcg total) by mouth daily.   losartan 100 MG tablet Commonly known as: COZAAR TAKE 1 TABLET BY MOUTH EVERY DAY   metFORMIN 1000 MG tablet Commonly known as: GLUCOPHAGE Take 1 tablet (1,000 mg total) by mouth daily with breakfast.   nitroGLYCERIN 0.4 MG SL tablet Commonly known as: Nitrostat Place 1 tablet (0.4 mg total) under the tongue every 5 (five) minutes x 3 doses as needed  for chest pain.   potassium chloride 10 MEQ tablet Commonly known as: KLOR-CON Take 1 tablet (10 mEq total) by mouth daily.   prasugrel 10 MG Tabs tablet Commonly known as: EFFIENT TAKE 1 TABLET BY MOUTH EVERY DAY   rosuvastatin 10 MG tablet Commonly known as: CRESTOR TAKE 1 TABLET BY MOUTH DAILY. PLEASE MAKE YEARLY APPT WITH DR. Johnsie Cancel FOR OCTOBER FOR FUTURE REFILLS   venlafaxine XR 75 MG 24 hr capsule Commonly known as: Effexor XR Take 3 capsules (225 mg total) by mouth daily with breakfast.       All past medical history, surgical history, allergies, family history, immunizations andmedications were updated in the EMR today and reviewed under the history and medication portions of their EMR.     ROS: Negative, with the exception of above mentioned in HPI   Objective:  BP (!) 148/78   Pulse 79   Temp 98.3 F (36.8 C) (Oral)   Ht 5' 6.5" (1.689 m)   Wt 190 lb (86.2 kg)   SpO2 100%   BMI 30.21 kg/m  Body mass index is 30.21 kg/m. Gen: Afebrile. No acute distress. Nontoxic. Pleasant female.  HENT: AT. .  Eyes:Pupils Equal Round Reactive to light, Extraocular movements intact,  Conjunctiva without redness, discharge or icterus. Neck/lymp/endocrine: Supple,no lymphadenopathy, no thyromegaly CV: RRR no murmur, no edema, +2/4 P posterior tibialis pulses Chest: CTAB, no wheeze or crackles Neuro:  Normal gait. PERLA. EOMi. Alert. Oriented x3 Psych: Normal affect, dress and demeanor. Normal speech. Normal thought content and judgment.   No exam data present No results found. Results for orders placed or performed in visit on 07/09/20 (from the past 24 hour(s))  POCT HgB A1C     Status: Abnormal   Collection Time: 07/09/20  8:21 AM  Result Value Ref Range   Hemoglobin A1C 6.1 (A) 4.0 - 5.6 %  HbA1c POC (<> result, manual entry) 6.1 4.0 - 5.6 %   HbA1c, POC (prediabetic range) 6.1 5.7 - 6.4 %   HbA1c, POC (controlled diabetic range) 6.1 0.0 - 7.0 %     Assessment/Plan: Daisy Bennett is a 64 y.o. female present for OV for [facility] Diabetes mellitus without complication (HCC)/Morbid obesity - stable. - continue  Amaryl to 4 mg in the morning and 1 mg in the evening. If next a1c < 6.0 will consider decreasing Amaryl dose.  - continue  Metformin 1000 mg daily (decreased secondary to kidney function) - increase exercise regimen and continue dietary modification.  - continue gabapentin supplied by neuro  POCT HgB A1C--> 5.9> 6.1 today - PSV 10/2016--> competed series -  foot exam 03/25/2020 - Eye exam: UTD 04/2020 UTD.  Dr. Venetia Constable, yearly eye exams - Microalbumin: on an ARB - flu shot UTD -3-54-monthfollow-up  Hypertension/hyperlipidemia/Antiplatelet or antithrombotic long-term use/athersclerosis/hypokalemia/s/p stent placement: -Has been mildly above goal last 2 appts.- Continue to monitor at home if above 140/90 will need to discuss with cardio team.  - follows with cardiology, Dr. NJohnsie Cancelwho prescribes medications.  - low salt, increase exercise.  - continue amlodipine 10, losartan 100, - increase coreg 6.25 mg BID> to 12.5 mg BID for better BP control.  - continue  hctz 50 mg Qd - continue  Kdur 10 meq/d - continue  fenofibrate - continue  statin - continue Effient and baby aspirin.  Cardiology feels she needs to continue both. - maintain routine cardiology follow ups.    Depression/hypothyroid/Multiple sclerosis (HCC)/gait instability/multiple falls/at high risk for falls Pt reports she is seeing a positive difference in attending PT and her gait training is helpful.  - stable.  - continue   Effexor 225 mg daily - continue  gabapentin-prescribed by neurology - Tecfidera prescribed by neurology.  - continue   amitriptyline 100 mg nightly, refills provided.   Next appt 4 mos for cmc   Reviewed expectations re: course of current medical issues.  Discussed self-management of symptoms.  Outlined signs and  symptoms indicating need for more acute intervention.  Patient verbalized understanding and all questions were answered.  Patient received an After-Visit Summary.    Orders Placed This Encounter  Procedures  . CBC  . Comp Met (CMET)  . TSH  . Lipid panel  . Vitamin D (25 hydroxy)  . POCT HgB A1C   Meds ordered this encounter  Medications  . carvedilol (COREG) 12.5 MG tablet    Sig: Take 1 tablet (12.5 mg total) by mouth 2 (two) times daily.    Dispense:  180 tablet    Refill:  1    Dc prior script for this med. Dose change  . glimepiride (AMARYL) 2 MG tablet    Sig: 2 tab in the morning and  1/2 tab in the evening.    Dispense:  270 tablet    Refill:  1  . esomeprazole (NEXIUM) 20 MG capsule    Sig: Take 1 capsule (20 mg total) by mouth daily at 12 noon.    Dispense:  90 capsule    Refill:  1  . hydrochlorothiazide (HYDRODIURIL) 50 MG tablet    Sig: Take 1 tablet (50 mg total) by mouth daily.    Dispense:  90 tablet    Refill:  1  . metFORMIN (GLUCOPHAGE) 1000 MG tablet    Sig: Take 1 tablet (1,000 mg total) by mouth daily with breakfast.    Dispense:  90 tablet    Refill:  1    DC prior scripts, dose has been changed.  Thank you  . potassium chloride (KLOR-CON) 10 MEQ tablet    Sig: Take 1 tablet (10 mEq total) by mouth daily.    Dispense:  90 tablet    Refill:  1    Do not send automatic refills.  . venlafaxine XR (EFFEXOR XR) 75 MG 24 hr capsule    Sig: Take 3 capsules (225 mg total) by mouth daily with breakfast.    Dispense:  270 capsule    Refill:  1  . amitriptyline (ELAVIL) 100 MG tablet    Sig: Take 1 tablet (100 mg total) by mouth daily.    Dispense:  90 tablet    Refill:  1   Referral Orders  No referral(s) requested today     Note is dictated utilizing voice recognition software. Although note has been proof read prior to signing, occasional typographical errors still can be missed. If any questions arise, please do not hesitate to call for  verification.   electronically signed by:  Howard Pouch, DO  [place]

## 2020-07-09 NOTE — Patient Instructions (Addendum)
We increased your carvedilol to 12.5 mg every 12 hours.   You can try tapering back on the amitriptyline- if you pain does not worsen we can consider a different med to help you sleep next appt if needed.    Next appt 4 months- First week in April.   Happy holidays.

## 2020-07-10 ENCOUNTER — Other Ambulatory Visit: Payer: Self-pay | Admitting: Family Medicine

## 2020-07-10 MED ORDER — LEVOTHYROXINE SODIUM 112 MCG PO TABS
112.0000 ug | ORAL_TABLET | Freq: Every day | ORAL | 3 refills | Status: DC
Start: 2020-07-10 — End: 2021-03-03

## 2020-07-12 ENCOUNTER — Encounter: Payer: Self-pay | Admitting: Neurology

## 2020-07-14 ENCOUNTER — Telehealth: Payer: Self-pay | Admitting: Neurology

## 2020-07-14 NOTE — Telephone Encounter (Signed)
Recent labs show continued low abs lymp count 0.4, on low dose Tecidera. Conferred with Dr Jannifer Franklin, will consider switch to something else, such as Aubagio, at next office visit in February.

## 2020-07-15 LAB — HM MAMMOGRAPHY

## 2020-07-28 ENCOUNTER — Telehealth: Payer: Self-pay | Admitting: Neurology

## 2020-07-28 NOTE — Telephone Encounter (Signed)
Please call the patient, I discussed with Dr. Anne Hahn her treatment plan, on low-dose Tecfidera, continued low absolute lymphocyte count 0.4.  We are planning to switch to Aubagio at her next follow-up visit.  Have her go ahead and stop the Tecfidera, give her absolute lymphocyte count time to improve over next few weeks before starting another MS medication. Next follow-up in Feb.

## 2020-07-28 NOTE — Telephone Encounter (Signed)
Spoke to pt and relayed the results of her continued lymphocyte count at 0.4.  STOP tecfidera to improve her count will change to Aubagio at next visit in February.  Pt verbalized understanding.

## 2020-08-01 ENCOUNTER — Other Ambulatory Visit: Payer: Self-pay | Admitting: Neurology

## 2020-08-01 DIAGNOSIS — G35 Multiple sclerosis: Secondary | ICD-10-CM

## 2020-08-13 ENCOUNTER — Encounter: Payer: Self-pay | Admitting: Family Medicine

## 2020-08-13 ENCOUNTER — Encounter: Payer: Self-pay | Admitting: Neurology

## 2020-08-13 NOTE — Telephone Encounter (Signed)
Unfortunately, the possibilities of possible causes of exhaustion are rather in list.  If symptoms are worsening I would encourage her to go to an urgent care to be seen.

## 2020-09-06 ENCOUNTER — Other Ambulatory Visit: Payer: Self-pay | Admitting: Cardiovascular Disease

## 2020-09-11 ENCOUNTER — Other Ambulatory Visit: Payer: Self-pay | Admitting: Emergency Medicine

## 2020-09-11 MED ORDER — DIMETHYL FUMARATE 120 MG PO CPDR: 120.0000 mg | DELAYED_RELEASE_CAPSULE | Freq: Two times a day (BID) | ORAL | 1 refills | Status: DC

## 2020-09-15 ENCOUNTER — Other Ambulatory Visit: Payer: Self-pay | Admitting: Cardiovascular Disease

## 2020-09-15 ENCOUNTER — Encounter: Payer: Self-pay | Admitting: Family Medicine

## 2020-09-22 ENCOUNTER — Telehealth: Payer: Self-pay | Admitting: Neurology

## 2020-09-22 ENCOUNTER — Other Ambulatory Visit: Payer: Self-pay

## 2020-09-22 ENCOUNTER — Encounter: Payer: Self-pay | Admitting: Neurology

## 2020-09-22 ENCOUNTER — Ambulatory Visit (INDEPENDENT_AMBULATORY_CARE_PROVIDER_SITE_OTHER): Payer: Medicare Other | Admitting: Neurology

## 2020-09-22 VITALS — BP 150/75 | HR 62 | Ht 66.0 in | Wt 194.0 lb

## 2020-09-22 DIAGNOSIS — G35 Multiple sclerosis: Secondary | ICD-10-CM | POA: Diagnosis not present

## 2020-09-22 NOTE — Progress Notes (Signed)
PATIENT: Daisy Bennett DOB: 1956-05-26  REASON FOR VISIT: follow up HISTORY FROM: patient  HISTORY OF PRESENT ILLNESS: Today 09/22/20 [name] is a 65 year old female with history of MS.  She is currently off Tecfidera due to low absolute lymphocyte count (0.05 Jul 2020 on low dose Tecfidera), and plan to switch to Aubagio. Last MRI of the brain in July 2020, overall stable from Sept 2017. Is on gabapentin 600 mg twice a day for left-sided sciatica well controlled.  MS remains stable, no changes to the vision, numbness or weakness to the arms or legs, or bowels and bladder.  Had several falls at the beginning of 2021, but had excellent benefit with PT gait training.  Her husband is now retired, they just got back from a 1 month trip to [place] in their camper.  Over the last year or so, her husband has noted some word finding difficulty.  She does drink a moderate amount of alcohol, 1-2 glasses of wine nightly, then 3 day a week more with liquor.  Has cut back her dose of amitriptyline to 50 mg at bedtime for dry mouth.  Will be working with her PCP to get a better sleep aid.  Her husband notes with her drinking, a occasional head jerk, she had a right hand hand twitch at baseline.  Here today for evaluation accompanied by her husband.  HISTORY 11/22/2019 SS: [name] is a 65 year old female with history of multiple sclerosis, currently on Tecfidera.  She is on low-dose Tecfidera 120 mg twice daily, due to low absolute lymphocyte count.  MRI of the brain was stable in July 2020 from September 2017.  She remains on gabapentin 600 mg twice a day for left-sided sciatica.  She has issues with balance, has had a few falls as such.  She remains active, enjoys working in her yard.  Her husband recently retired, they are planning to do some traveling.  Other than balance, denies any daily symptoms of MS. She remains overall stable.  She has routine follow-up with her PCP tomorrow.  She presents today for  evaluation unaccompanied.   REVIEW OF SYSTEMS: Out of a complete 14 system review of symptoms, the patient complains only of the following symptoms, and all other reviewed systems are negative.  n/a  ALLERGIES: Allergies  Allergen Reactions  . Betaseron [Interferon Beta-1b] Other (See Comments)    Increased LFT's  . Provigil [Modafinil] Swelling    Tongue swelling and sores  . Topamax [Topiramate]     Cognitive slowing  . Doxycycline Other (See Comments)    Thrush,dizziness    HOME MEDICATIONS: Outpatient Medications Prior to Visit  Medication Sig Dispense Refill  . amitriptyline (ELAVIL) 100 MG tablet Take 1 tablet (100 mg total) by mouth daily. (Patient taking differently: Take 50 mg by mouth daily.) 90 tablet 1  . amLODipine (NORVASC) 10 MG tablet Take 1 tablet (10 mg total) by mouth daily. 90 tablet 3  . aspirin EC 81 MG tablet Take 1 tablet (81 mg total) daily by mouth.    . B Complex Vitamins (B COMPLEX-B12 PO) Take by mouth.    . carvedilol (COREG) 12.5 MG tablet Take 1 tablet (12.5 mg total) by mouth 2 (two) times daily. 180 tablet 1  . cholecalciferol (VITAMIN D) 1000 units tablet Take 1,000 Units by mouth daily.    [name] esomeprazole (NEXIUM) 20 MG capsule Take 1 capsule (20 mg total) by mouth daily at 12 noon. 90 capsule 1  .  fenofibrate 160 MG tablet Take 1 tablet (160 mg total) by mouth daily. 90 tablet 3  . Ferrous Sulfate (IRON) 325 (65 FE) MG TABS Take 325 mg by mouth daily.    . fish oil-omega-3 fatty acids 1000 MG capsule Take 1 g by mouth daily.    [name] gabapentin (NEURONTIN) 600 MG tablet TAKE 1 TABLET BY MOUTH TWICE A DAY 180 tablet 3  . glimepiride (AMARYL) 2 MG tablet 2 tab in the morning and  1/2 tab in the evening. 270 tablet 1  . hydrochlorothiazide (HYDRODIURIL) 50 MG tablet Take 1 tablet (50 mg total) by mouth daily. 90 tablet 1  . levothyroxine (SYNTHROID) 112 MCG tablet Take 1 tablet (112 mcg total) by mouth daily. 90 tablet 3  . losartan (COZAAR) 100 MG  tablet Take 1 tablet (100 mg total) by mouth daily. Please keep upcoming appointment in April 2022 for future refills. Thank you 90 tablet 0  . metFORMIN (GLUCOPHAGE) 1000 MG tablet Take 1 tablet (1,000 mg total) by mouth daily with breakfast. 90 tablet 1  . potassium chloride (KLOR-CON) 10 MEQ tablet Take 1 tablet (10 mEq total) by mouth daily. 90 tablet 1  . prasugrel (EFFIENT) 10 MG TABS tablet Take 1 tablet (10 mg total) by mouth daily. Keep April follow up appointment for further refills. 90 tablet 0  . rosuvastatin (CRESTOR) 10 MG tablet Take 1 tablet (10 mg total) by mouth daily. Keep April follow up appointment for further refills. 90 tablet 0  . venlafaxine XR (EFFEXOR XR) 75 MG 24 hr capsule Take 3 capsules (225 mg total) by mouth daily with breakfast. 270 capsule 1  . nitroGLYCERIN (NITROSTAT) 0.4 MG SL tablet Place 1 tablet (0.4 mg total) under the tongue every 5 (five) minutes x 3 doses as needed for chest pain. 25 tablet 4  . Dimethyl Fumarate (TECFIDERA) 120 MG CPDR Take 120 mg by mouth 2 (two) times daily. 180 capsule 1   No facility-administered medications prior to visit.    PAST MEDICAL HISTORY: Past Medical History:  Diagnosis Date  . Basal cell carcinoma   . Brain bleed ([place]) 2017  . COVID-19 08/2019  . DM (diabetes mellitus) ([place])   . Former smoker   . 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 Goiter, unspecified   . History of colonic polyps 2012  . Multiple sclerosis ([place])   . Non-ST elevated myocardial infarction (non-STEMI) ([place])   . Oral mucositis 08/31/2019  . Oral mucositis 08/31/2019  . SCCA (squamous cell carcinoma) of skin    right shin  . SEMI (subendocardial myocardial infarction) ([place]) 09/29/09   TOTAL RCA WITH STENT TO OM BRANCH  . Shingles   . Squamous cell carcinoma 2019   lower ext  . Unspecified essential hypertension   . Unspecified hypothyroidism     PAST SURGICAL HISTORY: Past Surgical History:  Procedure Laterality Date  . CATARACT  SURGERY  12/10  . CESAREAN SECTION    . CORONARY ANGIOPLASTY WITH STENT PLACEMENT  2011  . KNEE SURGERY    . leg surgery     squamous ccell removed   . TONSILLECTOMY    . VESICOVAGINAL FISTULA CLOSURE W/ TAH      FAMILY HISTORY: Family History  Problem Relation Age of Onset  . Hypertension Mother   . Heart failure Father   . Diabetes Father   . Aneurysm Brother        THORACIC/ABD ANEURYSM  . Prostate cancer Brother 35  . Diabetes Brother   .  Fibromyalgia Sister   . Brain cancer Maternal Grandmother 26  . Multiple sclerosis Neg Hx   . Colon cancer Neg Hx   . Esophageal cancer Neg Hx     SOCIAL HISTORY: Social History   Socioeconomic History  . Marital status: Married    Spouse name: Not on file  . Number of children: 2  . Years of education: 59  . Highest education level: Not on file  Occupational History  . Occupation: Therapist, music  Tobacco Use  . Smoking status: Former Smoker    Packs/day: 1.00    Years: 15.00    Pack years: 15.00    Quit date: 12/16/2015    Years since quitting: 4.7  . Smokeless tobacco: Never Used  . Tobacco comment: on and off smoker  Vaping Use  . Vaping Use: Never used  Substance and Sexual Activity  . Alcohol use: Yes    Alcohol/week: 0.0 standard drinks    Comment: OCCASIONAL ALCOHOL USE.  . Drug use: No  . Sexual activity: Yes  Other Topics Concern  . Not on file  Social History Narrative   The patient lives with husband. 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    Patient drinks occasionally, does not chew tobacco, does not use recreational drugs. She does drink occasional caffeine. She does wear her seatbelt. She does wear bike helmet riding a bike. She does exercise 3 times a week. She does not wear hearing aids or dentures. There is a smoke alarm in her home. There are no firearms in her home. She feels safe in her relationship. She has never experienced physical abuse.   Patient sleeps 7-8  hours a night.   Patient drinks 2 cups of caffeine daily.   Patient is right handed.   Social Determinants of Health   Financial Resource Strain: Not on file  Food Insecurity: Not on file  Transportation Needs: Not on file  Physical Activity: Not on file  Stress: Not on file  Social Connections: Not on file  Intimate Partner Violence: Not on file   PHYSICAL EXAM  Vitals:   09/22/20 1315  BP: (!) 150/75  Pulse: 62  Weight: 194 lb (88 kg)  Height: 5\' 6"  (1.676 m)   Body mass index is 31.31 kg/m.  Generalized: Well developed, in no acute distress   Neurological examination  Mentation: Alert oriented to time, place, history taking. Follows all commands speech and language fluent Cranial nerve II-XII: Pupils were equal round reactive to light. Extraocular movements were full, visual field were full on confrontational test. Facial sensation and strength were normal. Head turning and shoulder shrug  were normal and symmetric. Motor: The motor testing reveals 5 over 5 strength of all 4 extremities. Good symmetric motor tone is noted throughout.  Sensory: Sensory testing is intact to soft touch on all 4 extremities. No evidence of extinction is noted.  Coordination: Cerebellar testing reveals good finger-nose-finger and heel-to-shin bilaterally.  Gait and station: Gait is normal. Tandem gait is mildly unsteady. Romberg is negative. No drift is seen.  Reflexes: Deep tendon reflexes are symmetric and normal bilaterally.   DIAGNOSTIC DATA (LABS, IMAGING, TESTING) - I reviewed patient records, labs, notes, testing and imaging myself where available.  Lab Results  Component Value Date   WBC 11.2 (H) 07/09/2020   HGB 12.6 07/09/2020   HCT 37.3 07/09/2020   MCV 98.4 07/09/2020   PLT 155.0 07/09/2020      Component Value Date/Time  NA 139 07/09/2020 0850   NA 139 02/12/2019 1107   K 3.5 07/09/2020 0850   CL 98 07/09/2020 0850   CO2 32 07/09/2020 0850   GLUCOSE 150 (H)  07/09/2020 0850   BUN 19 07/09/2020 0850   BUN 19 02/12/2019 1107   CREATININE 0.98 07/09/2020 0850   CREATININE 1.19 (H) 07/04/2019 0911   CALCIUM 9.7 07/09/2020 0850   PROT 7.1 07/09/2020 0850   PROT 6.8 02/12/2019 1107   ALBUMIN 4.6 07/09/2020 0850   ALBUMIN 4.5 02/12/2019 1107   AST 20 07/09/2020 0850   ALT 18 07/09/2020 0850   ALKPHOS 77 07/09/2020 0850   BILITOT 0.4 07/09/2020 0850   BILITOT <0.2 02/12/2019 1107   GFRNONAA 53 (L) 02/12/2019 1107   GFRAA 61 02/12/2019 1107   Lab Results  Component Value Date   CHOL 160 07/09/2020   HDL 53.10 07/09/2020   LDLCALC 73 07/04/2019   LDLDIRECT 77.0 07/09/2020   TRIG 238.0 (H) 07/09/2020   CHOLHDL 3 07/09/2020   Lab Results  Component Value Date   HGBA1C 6.1 (A) 07/09/2020   HGBA1C 6.1 07/09/2020   HGBA1C 6.1 07/09/2020   HGBA1C 6.1 07/09/2020   Lab Results  Component Value Date   VITAMINB12 682 07/22/2015   Lab Results  Component Value Date   TSH 2.30 07/09/2020    ASSESSMENT AND PLAN 66 y.o. year old female  has a past medical history of Basal cell carcinoma, Brain bleed ([place]) (2017), COVID-19 (08/2019), DM (diabetes mellitus) ([place]), Former smoker, Glossitis, benign migratory (08/31/2019), Goiter, unspecified, History of colonic polyps (2012), Multiple sclerosis ([place]), Non-ST elevated myocardial infarction (non-STEMI) ([place]), Oral mucositis (08/31/2019), Oral mucositis (08/31/2019), SCCA (squamous cell carcinoma) of skin, SEMI (subendocardial myocardial infarction) ([place]) (09/29/09), Shingles, Squamous cell carcinoma (2019), Unspecified essential hypertension, and Unspecified hypothyroidism. here with:  1.  Relapsing remitting multiple sclerosis  -Has been off Tecfidera since about December 2021 due to low absolute lymphocyte count, MS has remained stable -Planning switch to Aubagio  -Will check routine labs today in preparation -Check MRI of the brain with and without contrast for surveillance -Continue gabapentin 600  mg twice a day for left-sided sciatica pain -Talked about cutting back her alcohol consumption -Follow-up in 6 months or sooner if needed  I spent 30 minutes of face-to-face and non-face-to-face time with patient.  This included previsit chart review, lab review, study review, order entry, electronic health record documentation, patient education.  Butler Denmark, AGNP-C, DNP 09/22/2020, 1:50 PM [facility] [address], [place], [place] [address] [phone]

## 2020-09-22 NOTE — Patient Instructions (Signed)
Check labs  Check MRI  Will make choice about starting Aubagio once labs results See you back in 6 months

## 2020-09-22 NOTE — Telephone Encounter (Signed)
Medicare/aarp order sent to GI. No auth they will reach out to the patient to schedule.  

## 2020-09-23 LAB — CBC WITH DIFFERENTIAL/PLATELET
Basophils Absolute: 0 10*3/uL (ref 0.0–0.2)
Basos: 1 %
EOS (ABSOLUTE): 0.2 10*3/uL (ref 0.0–0.4)
Eos: 3 %
Hematocrit: 37.5 % (ref 34.0–46.6)
Hemoglobin: 12.7 g/dL (ref 11.1–15.9)
Immature Grans (Abs): 0 10*3/uL (ref 0.0–0.1)
Immature Granulocytes: 0 %
Lymphocytes Absolute: 0.7 10*3/uL (ref 0.7–3.1)
Lymphs: 9 %
MCH: 33.2 pg — ABNORMAL HIGH (ref 26.6–33.0)
MCHC: 33.9 g/dL (ref 31.5–35.7)
MCV: 98 fL — ABNORMAL HIGH (ref 79–97)
Monocytes Absolute: 0.8 10*3/uL (ref 0.1–0.9)
Monocytes: 11 %
Neutrophils Absolute: 5.4 10*3/uL (ref 1.4–7.0)
Neutrophils: 76 %
Platelets: 177 10*3/uL (ref 150–450)
RBC: 3.82 x10E6/uL (ref 3.77–5.28)
RDW: 12.1 % (ref 11.7–15.4)
WBC: 7.1 10*3/uL (ref 3.4–10.8)

## 2020-09-23 LAB — BASIC METABOLIC PANEL
BUN/Creatinine Ratio: 19 (ref 12–28)
BUN: 17 mg/dL (ref 8–27)
CO2: 25 mmol/L (ref 20–29)
Calcium: 10 mg/dL (ref 8.7–10.3)
Chloride: 93 mmol/L — ABNORMAL LOW (ref 96–106)
Creatinine, Ser: 0.9 mg/dL (ref 0.57–1.00)
GFR calc Af Amer: 77 mL/min/{1.73_m2} (ref >59)
GFR calc non Af Amer: 67 mL/min/{1.73_m2} (ref >59)
Glucose: 115 mg/dL — ABNORMAL HIGH (ref 65–99)
Potassium: 3.9 mmol/L (ref 3.5–5.2)
Sodium: 135 mmol/L (ref 134–144)

## 2020-09-23 LAB — HEPATIC FUNCTION PANEL
ALT: 35 IU/L — ABNORMAL HIGH (ref 0–32)
AST: 33 IU/L (ref 0–40)
Albumin: 4.6 g/dL (ref 3.8–4.8)
Alkaline Phosphatase: 77 IU/L (ref 44–121)
Bilirubin Total: 0.3 mg/dL (ref 0.0–1.2)
Bilirubin, Direct: 0.14 mg/dL (ref 0.00–0.40)
Total Protein: 7.4 g/dL (ref 6.0–8.5)

## 2020-09-23 NOTE — Progress Notes (Signed)
I have read the note, and I agree with the clinical assessment and plan.  Daisy Bennett K Daisy Bennett   

## 2020-09-29 ENCOUNTER — Telehealth: Payer: Self-pay | Admitting: Neurology

## 2020-09-29 NOTE — Telephone Encounter (Signed)
We will proceed with switch to Aubagio, absolute lymphocyte count has rebounded up to 0.7, from 0.4 in December 2021.  She has been off Tecfidera in the interim. Hepatic function panel showed mildly elevated ALT 35, not enough to stop switch to Aubagio. Likely needs to cut back on her ETOH consumption. Will have frequent monitoring LFTs every month for 1st 6 months.

## 2020-09-30 ENCOUNTER — Telehealth: Payer: Self-pay

## 2020-09-30 MED ORDER — PANTOPRAZOLE SODIUM 20 MG PO TBEC: 20.0000 mg | DELAYED_RELEASE_TABLET | Freq: Every day | ORAL | 1 refills | Status: DC

## 2020-09-30 NOTE — Telephone Encounter (Signed)
Why are they asking for the change and if it is an appropriate ask? I am also not certain if it is on her formulary.  thanks

## 2020-09-30 NOTE — Telephone Encounter (Signed)
Called pharmacy who stated that either, omeprazole, pantoprazole or famotidine may be cover but the current medication is not.

## 2020-09-30 NOTE — Telephone Encounter (Signed)
Informed pt of medication changes

## 2020-09-30 NOTE — Telephone Encounter (Signed)
Fax received from pharmacy requesting to send an alt med esompraxole mad dr 20 mg cap for esomeprazole 20 MG capsule.   Please advise if okay.

## 2020-09-30 NOTE — Telephone Encounter (Signed)
Aubagio start form faxed to [facility] [employer] [facility].  Received a receipt of confirmation.  Initiated PA on CMM:  Key: [id number].  Sent to [facility].  Should have a determination within 1-3 business days.

## 2020-09-30 NOTE — Addendum Note (Signed)
Addended by: Howard Pouch A on: 09/30/2020 04:26 PM   Modules accepted: Orders

## 2020-09-30 NOTE — Telephone Encounter (Signed)
Please inform patient the need to change her Nexium to Protonix secondary to her insurance.

## 2020-10-06 ENCOUNTER — Other Ambulatory Visit: Payer: Medicare Other

## 2020-10-06 NOTE — Telephone Encounter (Signed)
Aubagio PA has been approved by [facility] until 08/01/2021.  I have informed [facility].

## 2020-10-07 ENCOUNTER — Encounter: Payer: Self-pay | Admitting: Neurology

## 2020-10-07 MED ORDER — ALPRAZOLAM 0.5 MG PO TABS: ORAL_TABLET | ORAL | 0 refills | Status: DC

## 2020-10-07 MED ORDER — GABAPENTIN 600 MG PO TABS: 600.0000 mg | ORAL_TABLET | Freq: Three times a day (TID) | ORAL | 0 refills | Status: DC

## 2020-10-07 NOTE — Telephone Encounter (Signed)
I sent Xanax in for MRI. Increased gabapentin 3 times daily due to report of more sciatica pain on the left.   Meds ordered this encounter  Medications  . gabapentin (NEURONTIN) 600 MG tablet    Sig: Take 1 tablet (600 mg total) by mouth 3 (three) times daily.    Dispense:  270 tablet    Refill:  0    Order Specific Question:   Supervising Provider    Answer:   Marcial Pacas [[id number]]  . ALPRAZolam (XANAX) 0.5 MG tablet    Sig: Take 1-2 tablets 45 minutes before MRI, may take a 3rd right before going into MRI if needed. MUST HAVE DRIVER.    Dispense:  3 tablet    Refill:  0    Order Specific Question:   Supervising Provider    Answer:   Marcial Pacas [id number]

## 2020-10-13 ENCOUNTER — Other Ambulatory Visit: Payer: Self-pay

## 2020-10-13 ENCOUNTER — Ambulatory Visit
Admission: RE | Admit: 2020-10-13 | Discharge: 2020-10-13 | Disposition: A | Discharge status: Home / Self Care | Payer: Medicare Other | Source: Ambulatory Visit | Attending: Neurology | Admitting: Neurology

## 2020-10-13 DIAGNOSIS — G35 Multiple sclerosis: Secondary | ICD-10-CM | POA: Diagnosis not present

## 2020-10-13 DIAGNOSIS — Z5181 Encounter for therapeutic drug level monitoring: Secondary | ICD-10-CM

## 2020-10-13 MED ORDER — GADOBUTROL 1 MMOL/ML IV SOLN
10.0000 mL | Freq: Once | INTRAVENOUS | Status: AC | PRN
  Administered 2020-10-13: 10 mL via INTRAVENOUS

## 2020-10-14 ENCOUNTER — Telehealth: Payer: Self-pay

## 2020-10-14 NOTE — Telephone Encounter (Signed)
-----   Message from [name], NP sent at 10/14/2020  2:42 PM EDT ----- Sent my chart message:  [name],  Recent MRI of the brain appears overall stable, compared to previous in July 2020, there are no new lesions.  Hope you are doing well!  Daisy Bennett :)  IMPRESSION: This MRI of the brain with and without contrast shows the following: 1.   Multiple T2/FLAIR hyperintense foci, predominantly in the periventricular and juxtacortical white matter of the hemispheres.  This is consistent with chronic demyelinating plaque associated with multiple sclerosis.  None of the foci appear to be acute.  They do not enhance.  Compared to the MRI from 02/20/2019, there are no new lesions. 2.   No acute findings.  Normal enhancement pattern.

## 2020-10-28 ENCOUNTER — Encounter: Payer: Self-pay | Admitting: Family Medicine

## 2020-10-28 ENCOUNTER — Ambulatory Visit (INDEPENDENT_AMBULATORY_CARE_PROVIDER_SITE_OTHER): Payer: Medicare Other | Admitting: Family Medicine

## 2020-10-28 ENCOUNTER — Other Ambulatory Visit: Payer: Self-pay

## 2020-10-28 VITALS — BP 144/78 | HR 78 | Temp 98.7°F | Ht 66.0 in | Wt 185.0 lb

## 2020-10-28 DIAGNOSIS — G35 Multiple sclerosis: Secondary | ICD-10-CM | POA: Diagnosis not present

## 2020-10-28 DIAGNOSIS — Z7902 Long term (current) use of antithrombotics/antiplatelets: Secondary | ICD-10-CM

## 2020-10-28 DIAGNOSIS — E039 Hypothyroidism, unspecified: Secondary | ICD-10-CM

## 2020-10-28 DIAGNOSIS — E119 Type 2 diabetes mellitus without complications: Secondary | ICD-10-CM | POA: Diagnosis not present

## 2020-10-28 DIAGNOSIS — E782 Mixed hyperlipidemia: Secondary | ICD-10-CM

## 2020-10-28 DIAGNOSIS — N183 Chronic kidney disease, stage 3 unspecified: Secondary | ICD-10-CM

## 2020-10-28 DIAGNOSIS — I25118 Atherosclerotic heart disease of native coronary artery with other forms of angina pectoris: Secondary | ICD-10-CM

## 2020-10-28 DIAGNOSIS — I10 Essential (primary) hypertension: Secondary | ICD-10-CM

## 2020-10-28 LAB — POCT GLYCOSYLATED HEMOGLOBIN (HGB A1C)
HbA1c POC (<> result, manual entry): 6.6 % (ref 4.0–5.6)
HbA1c, POC (controlled diabetic range): 6.6 % (ref 0.0–7.0)
HbA1c, POC (prediabetic range): 6.6 % — AB (ref 5.7–6.4)
Hemoglobin A1C: 6.6 % — AB (ref 4.0–5.6)

## 2020-10-28 MED ORDER — FENOFIBRATE 160 MG PO TABS
160.0000 mg | ORAL_TABLET | Freq: Every day | ORAL | 3 refills | Status: DC
Start: 2020-10-28 — End: 2021-03-02

## 2020-10-28 MED ORDER — MIRTAZAPINE 15 MG PO TABS: 15.0000 mg | ORAL_TABLET | Freq: Every day | ORAL | 1 refills | Status: DC

## 2020-10-28 MED ORDER — VENLAFAXINE HCL ER 75 MG PO CP24
225.0000 mg | ORAL_CAPSULE | Freq: Every day | ORAL | 1 refills | Status: DC
Start: 2020-10-28 — End: 2021-03-02

## 2020-10-28 MED ORDER — POTASSIUM CHLORIDE ER 10 MEQ PO TBCR
10.0000 meq | EXTENDED_RELEASE_TABLET | Freq: Every day | ORAL | 1 refills | Status: DC
Start: 2020-10-28 — End: 2021-03-02

## 2020-10-28 MED ORDER — PANTOPRAZOLE SODIUM 20 MG PO TBEC
20.0000 mg | DELAYED_RELEASE_TABLET | Freq: Every day | ORAL | 1 refills | Status: DC
Start: 2020-10-28 — End: 2021-03-02

## 2020-10-28 MED ORDER — METFORMIN HCL 1000 MG PO TABS: 1000.0000 mg | ORAL_TABLET | Freq: Every day | ORAL | 1 refills | Status: DC

## 2020-10-28 MED ORDER — HYDROCHLOROTHIAZIDE 50 MG PO TABS
50.0000 mg | ORAL_TABLET | Freq: Every day | ORAL | 1 refills | Status: DC
Start: 2020-10-28 — End: 2021-03-02

## 2020-10-28 MED ORDER — GLIMEPIRIDE 2 MG PO TABS: ORAL_TABLET | ORAL | 1 refills | Status: DC

## 2020-10-28 NOTE — Progress Notes (Signed)
This visit occurred during the SARS-CoV-2 public health emergency.  Safety protocols were in place, including screening questions prior to the visit, additional usage of staff PPE, and extensive cleaning of exam room while observing appropriate contact time as indicated for disinfecting solutions.    Daisy Bennett , April 04, 1956, 65 y.o., female MRN: [id number] Patient Care Team    Relationship Specialty Notifications Start End  Ma Hillock, DO PCP - General Family Medicine  03/10/15   Josue Hector, MD PCP - Cardiology Cardiology Admissions 05/31/18   [place], Jennefer Bravo, MD Referring Physician Dermatology  02/10/16   Kathrynn Ducking, MD Consulting Physician Neurology  05/18/16   Linda Hedges, [place] Physician Obstetrics and Gynecology  06/04/16   Anell Barr, OD  Optometry  08/17/16     Chief Complaint  Patient presents with  . Follow-up    CMC; pt is not fasting     Subjective: .AANIYA Bennett is a 65 y.o. female present for [facility] follow up. Diabetes/morbid obesity: She reports  clients with Amaryl 4 mg/1mg daily and metformin 1000 mg qd and she is tolerating them. She is still not checking her sugars. Patient denies dizziness, hyperglycemic or hypoglycemic events. Patient denies numbness, tingling in the extremities or nonhealing wounds of feet.  She is prescribed gabapentin 600 mg BID by her neurologist.   POCT HgB A1C-->6.5 >6.4--> 6.3>>> 7.2>>6.0>>6.0 > 6.4> 5.9 > 6.6today - PSV 10/2016--> competed series> restart next appt now > 65 nad its been 5 yrs -  foot exam 02/28/2019 - Eye exam reports eye exam 05/01/2020 - flu shot UTD  Hypertension/hyperlipidemia: She reports compliance with her medication regimen of Crestor 10 mg daily, Effient 10 mg daily, losartan 100 mg daily, HCTZ 50 mg daily, fish oil 1000 mg daily, fenofibrate 160 mg daily Norvasc 10 mg, baby aspirin daily, Coreg 6.25 mg twice daily- prescribed by her cardiologist (with the exception  of HCTZ). .  H/O CAD, NSTEMI, post stent 2011 (OM)-collateralized RCA.  She follows with Dr. Johnsie Cancel. Patient denies chest pain, shortness of breath, dizziness or lower extremity edema.  Diet: low sodium Exercise: routine  depression/MS:  Patient reports her anxiety has increased since she stopped the elavil. .  She is compliant  with Effexor at 225 mg daily. She does have difficulty sleeping without it.  She has started to work with physical therapy with her unsteady gait 2/2 to MS. She has had multiple falls with injuries.     Depression screen [facility] 2/9 07/09/2020 03/25/2020 07/04/2019 02/15/2018 10/17/2017  Decreased Interest 0 0 0 0 0  Down, Depressed, Hopeless 0 0 0 0 0  PHQ - 2 Score 0 0 0 0 0  Altered sleeping - - - - 2  Tired, decreased energy - - - - 0  Change in appetite - - - - 1  Feeling bad or failure about yourself  - - - - 0  Trouble concentrating - - - - 0  Moving slowly or fidgety/restless - - - - 0  Suicidal thoughts - - - - 0  PHQ-9 Score - - - - 3  Some recent data might be hidden    Allergies  Allergen Reactions  . Betaseron [Interferon Beta-1b] Other (See Comments)    Increased LFT's  . Provigil [Modafinil] Swelling    Tongue swelling and sores  . Topamax [Topiramate]     Cognitive slowing  . Doxycycline Other (See Comments)    Thrush,dizziness   Social  History   Social History Narrative   The patient lives with husband. 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    Patient drinks occasionally, does not chew tobacco, does not use recreational drugs. She does drink occasional caffeine. She does wear her seatbelt. She does wear bike helmet riding a bike. She does exercise 3 times a week. She does not wear hearing aids or dentures. There is a smoke alarm in her home. There are no firearms in her home. She feels safe in her relationship. She has never experienced physical abuse.   Patient sleeps 7-8 hours a night.   Patient drinks 2 cups of  caffeine daily.   Patient is right handed.   Past Medical History:  Diagnosis Date  . Basal cell carcinoma   . Brain bleed ([place]) 2017  . COVID-19 08/2019  . DM (diabetes mellitus) ([place])   . Former smoker   . 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 Goiter, unspecified   . History of colonic polyps 2012  . Multiple sclerosis ([place])   . Non-ST elevated myocardial infarction (non-STEMI) ([place])   . Olecranon bursitis of left elbow 10/17/2017  . Oral mucositis 08/31/2019  . Oral mucositis 08/31/2019  . SCCA (squamous cell carcinoma) of skin    right shin  . SEMI (subendocardial myocardial infarction) ([place]) 09/29/09   TOTAL RCA WITH STENT TO OM BRANCH  . Shingles   . Squamous cell carcinoma 2019   lower ext  . Unspecified essential hypertension   . Unspecified hypothyroidism    Past Surgical History:  Procedure Laterality Date  . CATARACT SURGERY  12/10  . CESAREAN SECTION    . CORONARY ANGIOPLASTY WITH STENT PLACEMENT  2011  . KNEE SURGERY    . leg surgery     squamous ccell removed   . TONSILLECTOMY    . VESICOVAGINAL FISTULA CLOSURE W/ TAH     Family History  Problem Relation Age of Onset  . Hypertension Mother   . Heart failure Father   . Diabetes Father   . Aneurysm Brother        THORACIC/ABD ANEURYSM  . Prostate cancer Brother 73  . Diabetes Brother   . Fibromyalgia Sister   . Brain cancer Maternal Grandmother 48  . Multiple sclerosis Neg Hx   . Colon cancer Neg Hx   . Esophageal cancer Neg Hx    Allergies as of 10/28/2020      Reactions   Betaseron [interferon Beta-1b] Other (See Comments)   Increased LFT's   Provigil [modafinil] Swelling   Tongue swelling and sores   Topamax [topiramate]    Cognitive slowing   Doxycycline Other (See Comments)   Thrush,dizziness      Medication List       Accurate as of October 28, 2020 11:59 PM. If you have any questions, ask your nurse or doctor.        STOP taking these medications   ALPRAZolam 0.5 MG  tablet Commonly known as: XANAX Stopped by: Howard Pouch, DO   amitriptyline 100 MG tablet Commonly known as: ELAVIL Stopped by: Howard Pouch, DO     TAKE these medications   amLODipine 10 MG tablet Commonly known as: NORVASC Take 1 tablet (10 mg total) by mouth daily.   aspirin EC 81 MG tablet Take 1 tablet (81 mg total) daily by mouth.   Aubagio 14 MG Tabs Generic drug: Teriflunomide Take by mouth.   B COMPLEX-B12 PO Take by mouth.  carvedilol 12.5 MG tablet Commonly known as: COREG Take 1 tablet (12.5 mg total) by mouth 2 (two) times daily.   cholecalciferol 1000 units tablet Commonly known as: VITAMIN D Take 1,000 Units by mouth daily.   fenofibrate 160 MG tablet Take 1 tablet (160 mg total) by mouth daily.   fish oil-omega-3 fatty acids 1000 MG capsule Take 1 g by mouth daily.   gabapentin 600 MG tablet Commonly known as: NEURONTIN Take 1 tablet (600 mg total) by mouth 3 (three) times daily.   glimepiride 2 MG tablet Commonly known as: AMARYL 2 tab in the morning and  1/2 tab in the evening.   hydrochlorothiazide 50 MG tablet Commonly known as: HYDRODIURIL Take 1 tablet (50 mg total) by mouth daily.   Iron 325 (65 Fe) MG Tabs Take 325 mg by mouth daily.   levothyroxine 112 MCG tablet Commonly known as: SYNTHROID Take 1 tablet (112 mcg total) by mouth daily.   losartan 100 MG tablet Commonly known as: COZAAR Take 1 tablet (100 mg total) by mouth daily. Please keep upcoming appointment in April 2022 for future refills. Thank you   metFORMIN 1000 MG tablet Commonly known as: GLUCOPHAGE Take 1 tablet (1,000 mg total) by mouth daily with breakfast.   mirtazapine 15 MG tablet Commonly known as: Remeron Take 1 tablet (15 mg total) by mouth at bedtime. Started by: Howard Pouch, DO   nitroGLYCERIN 0.4 MG SL tablet Commonly known as: Nitrostat Place 1 tablet (0.4 mg total) under the tongue every 5 (five) minutes x 3 doses as needed for chest pain.    pantoprazole 20 MG tablet Commonly known as: PROTONIX Take 1 tablet (20 mg total) by mouth daily.   potassium chloride 10 MEQ tablet Commonly known as: KLOR-CON Take 1 tablet (10 mEq total) by mouth daily.   prasugrel 10 MG Tabs tablet Commonly known as: EFFIENT Take 1 tablet (10 mg total) by mouth daily. Keep April follow up appointment for further refills.   rosuvastatin 10 MG tablet Commonly known as: CRESTOR Take 1 tablet (10 mg total) by mouth daily. Keep April follow up appointment for further refills.   triamcinolone 55 MCG/ACT Aero nasal inhaler Commonly known as: NASACORT SMARTSIG:Both Nares   venlafaxine XR 75 MG 24 hr capsule Commonly known as: Effexor XR Take 3 capsules (225 mg total) by mouth daily with breakfast.       All past medical history, surgical history, allergies, family history, immunizations andmedications were updated in the EMR today and reviewed under the history and medication portions of their EMR.     ROS: Negative, with the exception of above mentioned in HPI   Objective:  BP (!) 144/78   Pulse 78   Temp 98.7 F (37.1 C) (Oral)   Ht 5\' 6"  (1.676 m)   Wt 185 lb (83.9 kg)   SpO2 99%   BMI 29.86 kg/m  Body mass index is 29.86 kg/m. Gen: Afebrile. No acute distress.  Nontoxic.  Very pleasant female. HENT: AT. [place].  Eyes:Pupils Equal Round Reactive to light, Extraocular movements intact,  Conjunctiva without redness, discharge or icterus. Neck/lymp/endocrine: Supple, no lymphadenopathy, no thyromegaly CV: RRR no murmur, no edema, +2/4 P posterior tibialis pulses Chest: CTAB, no wheeze or crackles Skin: No rashes, purpura or petechiae.  Neuro:  Normal gait. PERLA. EOMi. Alert. Orientedx3 Psych: Normal affect, dress and demeanor. Normal speech. Normal thought content and judgment.    No exam data present No results found. No results found for this  or any previous visit (from the past 24 hour(s)).  Assessment/Plan: JACKEE GLASNER is  a 65 y.o. female present for OV for [facility] Diabetes mellitus without complication (HCC)/Morbid obesity - stable. - continue  Amaryl to 4 mg in the morning and 1 mg in the evening.  - continue  Metformin 1000 mg daily (decreased secondary to kidney function) - increase exercise regimen and continue dietary modification.  - continue  gabapentin supplied by neuro  POCT HgB A1C--> 5.9> 6.1> 6.6 today - PSV 10/2016--> competed series> will restart next well visit now that it has been 5 yrs.  -  foot exam 03/25/2020 - Eye exam: UTD 04/2020 UTD.  Dr. Venetia Constable, yearly eye exams - Microalbumin: on an ARB - flu shot UTD -3-12-month follow-up  Hypertension/hyperlipidemia/Antiplatelet or antithrombotic long-term use/athersclerosis/hypokalemia/s/p stent placement: -Has been mildly above goal last 2 appts-however today she is experiencing more anxiety.  She will monitor this at home.  She also has an upcoming cardiology appointment in which she can be reevaluated after increasing her anxiety regimen - follows with cardiology, Dr. Johnsie Cancel who prescribes medications.  - low salt, increase exercise.  - continue amlodipine 10, losartan 100, -Continue coreg  12.5 mg BID  (increased last visit for better control) -Continue hctz 50 mg Qd -Continue Kdur 10 meq/d -Continue fenofibrate -Continue statin -Continue Effient and baby aspirin.  Cardiology feels she needs to continue both. - maintain routine cardiology follow ups.    Depression/hypothyroid/Multiple sclerosis (HCC)/gait instability/multiple falls/at high risk for falls Pt reports she is seeing a positive difference in attending PT and her gait training is helpful.  -She is endorsing more anxiety. - continue   Effexor 225 mg daily - continue   gabapentin-prescribed by neurology - aubagio prescribed by neurology.  - She has stopped   amitriptyline 100 mg nightly d/t dry mouth. -Start Remeron 7.5 mg - 15 mg nightly   Next appt 4 mos for  cmc   Reviewed expectations re: course of current medical issues.  Discussed self-management of symptoms.  Outlined signs and symptoms indicating need for more acute intervention.  Patient verbalized understanding and all questions were answered.  Patient received an After-Visit Summary.    Orders Placed This Encounter  Procedures  . POCT HgB A1C   Meds ordered this encounter  Medications  . venlafaxine XR (EFFEXOR XR) 75 MG 24 hr capsule    Sig: Take 3 capsules (225 mg total) by mouth daily with breakfast.    Dispense:  270 capsule    Refill:  1  . potassium chloride (KLOR-CON) 10 MEQ tablet    Sig: Take 1 tablet (10 mEq total) by mouth daily.    Dispense:  90 tablet    Refill:  1    Do not send automatic refills.  . pantoprazole (PROTONIX) 20 MG tablet    Sig: Take 1 tablet (20 mg total) by mouth daily.    Dispense:  90 tablet    Refill:  1  . metFORMIN (GLUCOPHAGE) 1000 MG tablet    Sig: Take 1 tablet (1,000 mg total) by mouth daily with breakfast.    Dispense:  90 tablet    Refill:  1    DC prior scripts, dose has been changed.  Thank you  . hydrochlorothiazide (HYDRODIURIL) 50 MG tablet    Sig: Take 1 tablet (50 mg total) by mouth daily.    Dispense:  90 tablet    Refill:  1  . glimepiride (AMARYL) 2 MG tablet  Sig: 2 tab in the morning and  1/2 tab in the evening.    Dispense:  270 tablet    Refill:  1  . fenofibrate 160 MG tablet    Sig: Take 1 tablet (160 mg total) by mouth daily.    Dispense:  90 tablet    Refill:  3  . mirtazapine (REMERON) 15 MG tablet    Sig: Take 1 tablet (15 mg total) by mouth at bedtime.    Dispense:  90 tablet    Refill:  1   Referral Orders  No referral(s) requested today     Note is dictated utilizing voice recognition software. Although note has been proof read prior to signing, occasional typographical errors still can be missed. If any questions arise, please do not hesitate to call for verification.    electronically signed by:  Howard Pouch, DO  [place]

## 2020-10-28 NOTE — Patient Instructions (Signed)
Next appt 4 mos. Start Remeron 1/2 tab before bed. If needed after 3 days, can  increase to 1 tab.

## 2020-10-30 ENCOUNTER — Encounter: Payer: Self-pay | Admitting: Family Medicine

## 2020-11-03 ENCOUNTER — Ambulatory Visit: Payer: Medicare Other | Admitting: Cardiovascular Disease

## 2020-11-06 NOTE — Progress Notes (Signed)
Patient ID: CLARKE AMBURN, female   DOB: 1956-06-18, 65 y.o.   MRN: [id number]    65 y.o. seen f/u CAD  post stenting 2011  She had a non ST elevation MI with subsequent stenting of an OM. She has diffuse 3VD with a total right that is collateralized. She had smoewhat atypical presentation with her DM. Marland KitchenLast myovue done 06/16/18 low risk non ischemic EF 68%  Lots of dermatology issues had multiple lesions removed from right leg and thigh  October 2017:  Fell and hit head with subdural Memory issues since then. 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   Unfortunately with stress is smoking a bit again  COVID positive December burning and bumps on her tongue Had COVID outpatient infusion Rx RX with peridex, biotene mouth rinse and Diflucan Referred to dentist and ENT  More issues with anxiety on Effexor and Remeron started by Dr Otila Kluver 10/28/20 He long term amytriptylline was making her mouth dry   Has 4 grand children and two daughters living in [place] new nitro   ROS: Denies fever, malais, weight loss, blurry vision, decreased visual acuity, cough, sputum, SOB, hemoptysis, pleuritic pain, palpitaitons, heartburn, abdominal pain, melena, lower extremity edema, claudication, or rash.  All other systems reviewed and negative  General: BP 132/80   Pulse 82   Ht 5\' 6"  (1.676 m)   Wt 83.9 kg   SpO2 98%   BMI 29.86 kg/m  Affect appropriate Healthy:  appears stated age 86: normal  Neck supple with no adenopathy JVP normal no bruits no thyromegaly Lungs clear with no wheezing and good diaphragmatic motion Heart:  S1/S2 no murmur, no rub, gallop or click PMI normal Abdomen: benighn, BS positve, no tenderness, no AAA no bruit.  No HSM or HJR Distal pulses intact with no bruits No edema Neuro non-focal Skin warm and dry No muscular weakness   Current Outpatient Medications  Medication Sig  Dispense Refill  . amLODipine (NORVASC) 10 MG tablet Take 1 tablet (10 mg total) by mouth daily. 90 tablet 3  . aspirin EC 81 MG tablet Take 1 tablet (81 mg total) daily by mouth.    . B Complex Vitamins (B COMPLEX-B12 PO) Take by mouth.    . carvedilol (COREG) 12.5 MG tablet Take 1 tablet (12.5 mg total) by mouth 2 (two) times daily. 180 tablet 1  . cholecalciferol (VITAMIN D) 1000 units tablet Take 1,000 Units by mouth daily.    . fenofibrate 160 MG tablet Take 1 tablet (160 mg total) by mouth daily. 90 tablet 3  . Ferrous Sulfate (IRON) 325 (65 FE) MG TABS Take 325 mg by mouth daily.    . fish oil-omega-3 fatty acids 1000 MG capsule Take 1 g by mouth daily.    [name] gabapentin (NEURONTIN) 600 MG tablet Take 1 tablet (600 mg total) by mouth 3 (three) times daily. 270 tablet 0  . glimepiride (AMARYL) 2 MG tablet 2 tab in the morning and  1/2 tab in the evening. 270 tablet 1  . hydrochlorothiazide (HYDRODIURIL) 50 MG tablet Take 1 tablet (50 mg total) by mouth daily. 90 tablet 1  . levothyroxine (SYNTHROID) 112 MCG tablet Take 1 tablet (112 mcg total) by mouth daily. 90 tablet 3  . losartan (COZAAR) 100 MG tablet Take 1 tablet (100 mg total) by mouth daily. Please keep upcoming appointment in April 2022 for future refills. Thank you 90  tablet 0  . metFORMIN (GLUCOPHAGE) 1000 MG tablet Take 1 tablet (1,000 mg total) by mouth daily with breakfast. 90 tablet 1  . mirtazapine (REMERON) 15 MG tablet Take 1 tablet (15 mg total) by mouth at bedtime. 90 tablet 1  . pantoprazole (PROTONIX) 20 MG tablet Take 1 tablet (20 mg total) by mouth daily. 90 tablet 1  . potassium chloride (KLOR-CON) 10 MEQ tablet Take 1 tablet (10 mEq total) by mouth daily. 90 tablet 1  . prasugrel (EFFIENT) 10 MG TABS tablet Take 1 tablet (10 mg total) by mouth daily. Keep April follow up appointment for further refills. 90 tablet 0  . rosuvastatin (CRESTOR) 10 MG tablet Take 1 tablet (10 mg total) by mouth daily. Keep April follow up  appointment for further refills. 90 tablet 0  . Teriflunomide (AUBAGIO) 14 MG TABS Take by mouth.    . triamcinolone (NASACORT) 55 MCG/ACT AERO nasal inhaler SMARTSIG:Both Nares    . venlafaxine XR (EFFEXOR XR) 75 MG 24 hr capsule Take 3 capsules (225 mg total) by mouth daily with breakfast. 270 capsule 1  . nitroGLYCERIN (NITROSTAT) 0.4 MG SL tablet Place 1 tablet (0.4 mg total) under the tongue every 5 (five) minutes x 3 doses as needed for chest pain. 25 tablet 4   No current facility-administered medications for this visit.    Allergies  Betaseron [interferon beta-1b], Provigil [modafinil], Topamax [topiramate], and Doxycycline  Electrocardiogram:  06/01/18 SR rate 80 nonspecific ST changes 11/17/2020 NSR rate 82 normal   Assessment and Plan  CAD: Collateralized RCA and stent to OM in 2011 no angina continue medical Rx  New nitro called in Low risk myovue 06/16/17 normal perfusion no ischemia EF 68% Continue Effient due to non revascularized disease  New nitro called in   HTN: Well controlled.  Continue current medications and low sodium Dash type diet.    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

## 2020-11-07 ENCOUNTER — Ambulatory Visit: Payer: Medicare Other | Admitting: Family Medicine

## 2020-11-11 ENCOUNTER — Other Ambulatory Visit (INDEPENDENT_AMBULATORY_CARE_PROVIDER_SITE_OTHER): Payer: Self-pay

## 2020-11-11 DIAGNOSIS — Z0289 Encounter for other administrative examinations: Secondary | ICD-10-CM

## 2020-11-11 DIAGNOSIS — G35 Multiple sclerosis: Secondary | ICD-10-CM

## 2020-11-11 DIAGNOSIS — Z5181 Encounter for therapeutic drug level monitoring: Secondary | ICD-10-CM

## 2020-11-12 ENCOUNTER — Encounter: Payer: Self-pay | Admitting: *Deleted

## 2020-11-12 ENCOUNTER — Other Ambulatory Visit: Payer: Self-pay | Admitting: Neurology

## 2020-11-12 ENCOUNTER — Telehealth: Payer: Self-pay

## 2020-11-12 ENCOUNTER — Encounter: Payer: Self-pay | Admitting: Neurology

## 2020-11-12 DIAGNOSIS — G35 Multiple sclerosis: Secondary | ICD-10-CM

## 2020-11-12 LAB — HEPATIC FUNCTION PANEL
ALT: 29 IU/L (ref 0–32)
AST: 28 IU/L (ref 0–40)
Albumin: 4.1 g/dL (ref 3.8–4.8)
Alkaline Phosphatase: 91 IU/L (ref 44–121)
Bilirubin Total: 0.2 mg/dL (ref 0.0–1.2)
Bilirubin, Direct: 0.12 mg/dL (ref 0.00–0.40)
Total Protein: 6.7 g/dL (ref 6.0–8.5)

## 2020-11-12 NOTE — Telephone Encounter (Signed)
-----   Message from [name], NP sent at 11/12/2020  7:59 AM EDT ----- Sent my chart message:  [name], Liver function panel is within normal limits. Hope you are doing well. Daisy Bennett

## 2020-11-12 NOTE — Telephone Encounter (Signed)
Opened in error

## 2020-11-17 ENCOUNTER — Ambulatory Visit (INDEPENDENT_AMBULATORY_CARE_PROVIDER_SITE_OTHER): Payer: Medicare Other | Admitting: Cardiovascular Disease

## 2020-11-17 ENCOUNTER — Other Ambulatory Visit: Payer: Self-pay

## 2020-11-17 ENCOUNTER — Encounter: Payer: Self-pay | Admitting: Cardiovascular Disease

## 2020-11-17 VITALS — BP 132/80 | HR 82 | Ht 66.0 in | Wt 185.0 lb

## 2020-11-17 DIAGNOSIS — I2510 Atherosclerotic heart disease of native coronary artery without angina pectoris: Secondary | ICD-10-CM

## 2020-11-17 DIAGNOSIS — I25119 Atherosclerotic heart disease of native coronary artery with unspecified angina pectoris: Secondary | ICD-10-CM

## 2020-11-17 MED ORDER — NITROGLYCERIN 0.4 MG SL SUBL: 0.4000 mg | SUBLINGUAL_TABLET | SUBLINGUAL | 4 refills | Status: DC | PRN

## 2020-11-17 NOTE — Patient Instructions (Signed)

## 2020-11-26 ENCOUNTER — Ambulatory Visit (INDEPENDENT_AMBULATORY_CARE_PROVIDER_SITE_OTHER): Payer: Medicare Other

## 2020-11-26 ENCOUNTER — Other Ambulatory Visit: Payer: Self-pay

## 2020-11-26 VITALS — BP 142/74 | HR 60 | Temp 96.7°F | Resp 16 | Ht 66.0 in | Wt 185.8 lb

## 2020-11-26 DIAGNOSIS — Z0000 Encounter for general adult medical examination without abnormal findings: Secondary | ICD-10-CM

## 2020-11-26 NOTE — Progress Notes (Signed)
Subjective:   Daisy Bennett is a 65 y.o. female who presents for Medicare Annual (Subsequent) preventive examination.  Review of Systems     Cardiac Risk Factors include: advanced age (>61men, >49 women);diabetes mellitus;dyslipidemia;hypertension;sedentary lifestyle     Objective:    Today's Vitals   11/26/20 1540  BP: (!) 142/74  Pulse: 60  Resp: 16  Temp: (!) 96.7 F (35.9 C)  TempSrc: Temporal  SpO2: 98%  Weight: 185 lb 12.8 oz (84.3 kg)  Height: 5\' 6"  (1.676 m)   Body mass index is 29.99 kg/m.  Advanced Directives 11/26/2020 03/18/2020 02/21/2020 07/06/2017 05/19/2016 05/17/2016 03/10/2015  Does Patient Have a Medical Advance Directive? Yes Yes No Yes Yes Yes Yes  Type of Paramedic of [place];Living will [place];Living will - [place];Living will Living will;Healthcare Power of Attorney Living will;Healthcare Power of [place];Living will  Does patient want to make changes to medical advance directive? - - - - No - Patient declined - -  Copy of [place] in Chart? No - copy requested - - - No - copy requested No - copy requested -  Would patient like information on creating a medical advance directive? - - No - Patient declined - - - -    Current Medications (verified) Outpatient Encounter Medications as of 11/26/2020  Medication Sig  . amLODipine (NORVASC) 10 MG tablet Take 1 tablet (10 mg total) by mouth daily.  [name] aspirin EC 81 MG tablet Take 1 tablet (81 mg total) daily by mouth.  . B Complex Vitamins (B COMPLEX-B12 PO) Take by mouth.  . carvedilol (COREG) 12.5 MG tablet Take 1 tablet (12.5 mg total) by mouth 2 (two) times daily.  . cholecalciferol (VITAMIN D) 1000 units tablet Take 1,000 Units by mouth daily.  . fenofibrate 160 MG tablet Take 1 tablet (160 mg total) by mouth daily.  . Ferrous Sulfate (IRON) 325 (65 FE) MG TABS Take 325 mg by mouth daily.   . fish oil-omega-3 fatty acids 1000 MG capsule Take 1 g by mouth daily.  [name] gabapentin (NEURONTIN) 600 MG tablet Take 1 tablet (600 mg total) by mouth 3 (three) times daily.  [name] glimepiride (AMARYL) 2 MG tablet 2 tab in the morning and  1/2 tab in the evening.  . hydrochlorothiazide (HYDRODIURIL) 50 MG tablet Take 1 tablet (50 mg total) by mouth daily.  [name] levothyroxine (SYNTHROID) 112 MCG tablet Take 1 tablet (112 mcg total) by mouth daily.  [name] losartan (COZAAR) 100 MG tablet Take 1 tablet (100 mg total) by mouth daily. Please keep upcoming appointment in April 2022 for future refills. Thank you  . metFORMIN (GLUCOPHAGE) 1000 MG tablet Take 1 tablet (1,000 mg total) by mouth daily with breakfast.  . mirtazapine (REMERON) 15 MG tablet Take 1 tablet (15 mg total) by mouth at bedtime.  . nitroGLYCERIN (NITROSTAT) 0.4 MG SL tablet Place 1 tablet (0.4 mg total) under the tongue every 5 (five) minutes x 3 doses as needed for chest pain.  . pantoprazole (PROTONIX) 20 MG tablet Take 1 tablet (20 mg total) by mouth daily.  . potassium chloride (KLOR-CON) 10 MEQ tablet Take 1 tablet (10 mEq total) by mouth daily.  . prasugrel (EFFIENT) 10 MG TABS tablet Take 1 tablet (10 mg total) by mouth daily. Keep April follow up appointment for further refills.  . rosuvastatin (CRESTOR) 10 MG tablet Take 1 tablet (10 mg total) by mouth daily.  Keep April follow up appointment for further refills.  . Teriflunomide (AUBAGIO) 14 MG TABS Take by mouth.  . triamcinolone (NASACORT) 55 MCG/ACT AERO nasal inhaler SMARTSIG:Both Nares  . venlafaxine XR (EFFEXOR XR) 75 MG 24 hr capsule Take 3 capsules (225 mg total) by mouth daily with breakfast.   No facility-administered encounter medications on file as of 11/26/2020.    Allergies (verified) Betaseron [interferon beta-1b], Provigil [modafinil], Topamax [topiramate], and Doxycycline   History: Past Medical History:  Diagnosis Date  . Basal cell carcinoma   . Brain bleed  ([place]) 2017  . COVID-19 08/2019  . DM (diabetes mellitus) ([place])   . Former smoker   . 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 Goiter, unspecified   . History of colonic polyps 2012  . Multiple sclerosis ([place])   . Non-ST elevated myocardial infarction (non-STEMI) ([place])   . Olecranon bursitis of left elbow 10/17/2017  . Oral mucositis 08/31/2019  . Oral mucositis 08/31/2019  . SCCA (squamous cell carcinoma) of skin    right shin  . SEMI (subendocardial myocardial infarction) ([place]) 09/29/09   TOTAL RCA WITH STENT TO OM BRANCH  . Shingles   . Squamous cell carcinoma 2019   lower ext  . Unspecified essential hypertension   . Unspecified hypothyroidism    Past Surgical History:  Procedure Laterality Date  . CATARACT SURGERY  12/10  . CESAREAN SECTION    . CORONARY ANGIOPLASTY WITH STENT PLACEMENT  2011  . KNEE SURGERY    . leg surgery     squamous ccell removed   . TONSILLECTOMY    . VESICOVAGINAL FISTULA CLOSURE W/ TAH     Family History  Problem Relation Age of Onset  . Hypertension Mother   . Heart failure Father   . Diabetes Father   . Aneurysm Brother        THORACIC/ABD ANEURYSM  . Prostate cancer Brother 69  . Diabetes Brother   . Fibromyalgia Sister   . Brain cancer Maternal Grandmother 79  . Multiple sclerosis Neg Hx   . Colon cancer Neg Hx   . Esophageal cancer Neg Hx    Social History   Socioeconomic History  . Marital status: Married    Spouse name: Not on file  . Number of children: 2  . Years of education: 22  . Highest education level: Not on file  Occupational History  . Occupation: Therapist, music  Tobacco Use  . Smoking status: Former Smoker    Packs/day: 1.00    Years: 15.00    Pack years: 15.00    Quit date: 12/16/2015    Years since quitting: 4.9  . Smokeless tobacco: Never Used  . Tobacco comment: on and off smoker  Vaping Use  . Vaping Use: Never used  Substance and Sexual Activity  . Alcohol use: Yes    Alcohol/week:  0.0 standard drinks    Comment: OCCASIONAL ALCOHOL USE.  . Drug use: No  . Sexual activity: Yes  Other Topics Concern  . Not on file  Social History Narrative   The patient lives with husband. 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    Patient drinks occasionally, does not chew tobacco, does not use recreational drugs. She does drink occasional caffeine. She does wear her seatbelt. She does wear bike helmet riding a bike. She does exercise 3 times a week. She does not wear hearing aids or dentures. There is a smoke alarm in her home. There are  no firearms in her home. She feels safe in her relationship. She has never experienced physical abuse.   Patient sleeps 7-8 hours a night.   Patient drinks 2 cups of caffeine daily.   Patient is right handed.   Social Determinants of Health   Financial Resource Strain: Low Risk   . Difficulty of Paying Living Expenses: Not hard at all  Food Insecurity: No Food Insecurity  . Worried About Charity fundraiser in the Last Year: Never true  . Ran Out of Food in the Last Year: Never true  Transportation Needs: No Transportation Needs  . Lack of Transportation (Medical): No  . Lack of Transportation (Non-Medical): No  Physical Activity: Inactive  . Days of Exercise per Week: 0 days  . Minutes of Exercise per Session: 0 min  Stress: No Stress Concern Present  . Feeling of Stress : Not at all  Social Connections: Moderately Integrated  . Frequency of Communication with Friends and Family: More than three times a week  . Frequency of Social Gatherings with Friends and Family: More than three times a week  . Attends Religious Services: Never  . Active Member of Clubs or Organizations: Yes  . Attends Archivist Meetings: 1 to 4 times per year  . Marital Status: Married    Tobacco Counseling Counseling given: Not Answered Comment: on and off smoker   Clinical Intake:  Pre-visit preparation completed: Yes  Pain :  No/denies pain     Nutritional Status: BMI 25 -29 Overweight Nutritional Risks: None Diabetes: Yes CBG done?: No Did pt. bring in CBG monitor from home?: No  How often do you need to have someone help you when you read instructions, pamphlets, or other written materials from your doctor or pharmacy?: 1 - Never  Diabetes:  Is the patient diabetic?  Yes  If diabetic, was a CBG obtained today?  No  Did the patient bring in their glucometer from home?  No  How often do you monitor your CBG's? never.   Financial Strains and Diabetes Management:  Are you having any financial strains with the device, your supplies or your medication? No .  Does the patient want to be seen by Chronic Care Management for management of their diabetes?  No  Would the patient like to be referred to a Nutritionist or for Diabetic Management?  No   Diabetic Exams:  Diabetic Eye Exam: Completed 05/01/2020.   Diabetic Foot Exam: Completed 03/25/2020.   Interpreter Needed?: No  Information entered by :: Caroleen Hamman LPN   Activities of Daily Living In your present state of health, do you have any difficulty performing the following activities: 11/26/2020  Hearing? N  Vision? N  Difficulty concentrating or making decisions? Y  Comment had a brain bleed several years ago-sees neurolgy  Walking or climbing stairs? N  Dressing or bathing? N  Doing errands, shopping? N  Preparing Food and eating ? N  Using the Toilet? N  In the past six months, have you accidently leaked urine? N  Do you have problems with loss of bowel control? N  Managing your Medications? N  Managing your Finances? N  Housekeeping or managing your Housekeeping? N  Some recent data might be hidden    Patient Care Team: Ma Hillock, DO as PCP - General (Family Medicine) Josue Hector, MD as PCP - Cardiology (Cardiology) [place], Jennefer Bravo, MD as Referring Physician (Dermatology) Kathrynn Ducking, MD as Consulting  Physician (Neurology) Lynnette Caffey,  Jinny Blossom, DO as Consulting Physician (Obstetrics and Gynecology) Anell Barr, OD (Optometry)  Indicate any recent Medical Services you may have received from other than [facility] providers in the past year (date may be approximate).     Assessment:   This is a routine wellness examination for [facility].  Hearing/Vision screen  Hearing Screening   125Hz  250Hz  500Hz  1000Hz  2000Hz  3000Hz  4000Hz  6000Hz  8000Hz   Right ear:           Left ear:           Comments: Mild hearing loss for years  Vision Screening Comments: Last eye exam-04/2020-Dr. Ellin Mayhew  Dietary issues and exercise activities discussed: Current Exercise Habits: The patient does not participate in regular exercise at present, Exercise limited by: None identified  Goals    . Patient Stated     Maintain current health      Depression Screen PHQ 2/9 Scores 11/26/2020 07/09/2020 03/25/2020 07/04/2019 02/15/2018 10/17/2017 06/17/2017  PHQ - 2 Score 0 0 0 0 0 0 0  PHQ- 9 Score - - - - - 3 -    Fall Risk Fall Risk  11/26/2020 10/30/2020 07/09/2020 03/25/2020 03/04/2020  Falls in the past year? 0 1 0 1 1  Number falls in past yr: 0 1 0 1 1  Injury with Fall? 0 1 0 1 1  Risk for fall due to : - History of fall(s);Impaired balance/gait;Medication side effect - History of fall(s) Impaired balance/gait  Follow up Falls prevention discussed Falls evaluation completed Falls evaluation completed Falls evaluation completed Education provided;Falls prevention discussed    FALL RISK PREVENTION PERTAINING TO THE HOME:  Any stairs in or around the home? Yes  If so, are there any without handrails? No  Home free of loose throw rugs in walkways, pet beds, electrical cords, etc? Yes  Adequate lighting in your home to reduce risk of falls? Yes   ASSISTIVE DEVICES UTILIZED TO PREVENT FALLS:  Life alert? No  Use of a cane, walker or w/c? No  Grab bars in the bathroom? No  Shower chair or bench in shower? No  Elevated  toilet seat or a handicapped toilet? No   TIMED UP AND GO:  Was the test performed? Yes .  Length of time to ambulate 10 feet: 10 sec.   Gait steady and fast without use of assistive device  Cognitive Function:Patient sees a neurologist . [place] Exam 02/12/2019 08/10/2018 06/22/2017 10/12/2016 03/10/2016  Not completed: - Refused - - -  Orientation to time 4 - 4 5 5   Orientation to Place 5 - 5 5 5   Registration 3 - 3 3 3   Attention/ Calculation 5 - 5 5 5   Recall 3 - 2 2 2   Language- name 2 objects 2 - 2 2 2   Language- repeat 1 - 1 1 1   Language- follow 3 step command 3 - 3 3 3   Language- read & follow direction 1 - 1 1 1   Write a sentence 1 - 1 1 1   Copy design 1 - 1 1 1   Total score 29 - 28 29 29         Immunizations Immunization History  Administered Date(s) Administered  . Fluad Quad(high Dose 65+) 07/04/2019, 06/05/2020  . Hepatitis A, Adult 10/17/2017, 05/18/2018  . Influenza,inj,Quad PF,6+ Mos 04/16/2015, 05/12/2016, 06/17/2017, 05/18/2018  . PFIZER Comirnaty(Gray Top)Covid-19 Tri-Sucrose Vaccine 08/27/2020  . PFIZER(Purple Top)SARS-COV-2 Vaccination 07/23/2020  . Pneumococcal Conjugate-13 11/15/2016  . Pneumococcal Polysaccharide-23 08/02/2009, 10/14/2015  .  Tdap 12/26/2013  . Zoster Recombinat (Shingrix) 10/22/2019, 02/07/2020    TDAP status: Up to date  Flu Vaccine status: Up to date  Pneumococcal vaccine status: Up to date  Covid-19 vaccine status: Completed vaccines  Qualifies for Shingles Vaccine? No   Zostavax completed No   Shingrix Completed?: Yes  Screening Tests Health Maintenance  Topic Date Due  . COVID-19 Vaccine (3 - Pfizer risk 4-dose series) 09/24/2020  . PNA vac Low Risk Adult (2 of 2 - PPSV23) 10/13/2020  . HEMOGLOBIN A1C  01/28/2021  . INFLUENZA VACCINE  03/02/2021  . FOOT EXAM  03/25/2021  . OPHTHALMOLOGY EXAM  05/01/2021  . MAMMOGRAM  06/10/2021  . COLONOSCOPY (Pts 45-79yrs Insurance coverage will need to be  confirmed)  05/27/2022  . TETANUS/TDAP  12/27/2023  . DEXA SCAN  02/17/2026  . Hepatitis C Screening  Completed  . HPV VACCINES  Aged Out    Health Maintenance  Health Maintenance Due  Topic Date Due  . COVID-19 Vaccine (3 - Pfizer risk 4-dose series) 09/24/2020  . PNA vac Low Risk Adult (2 of 2 - PPSV23) 10/13/2020    Colorectal cancer screening: Type of screening: Colonoscopy. Completed 05/28/2019. Repeat every 3 years  Mammogram status: Completed Bilateral 2022 at GYN office. Repeat every year Awaiting copy of results.  Bone Density status: Due-Declined today.  Lung Cancer Screening: (Low Dose CT Chest recommended if Age 57-80 years, 30 pack-year currently smoking OR have quit w/in 15years.) does not qualify.    Additional Screening:  Hepatitis C Screening:  Completed 10/14/2015  Vision Screening: Recommended annual ophthalmology exams for early detection of glaucoma and other disorders of the eye. Is the patient up to date with their annual eye exam?  Yes  Who is the provider or what is the name of the office in which the patient attends annual eye exams? Dr. Ellin Mayhew   Dental Screening: Recommended annual dental exams for proper oral hygiene  Community Resource Referral / Chronic Care Management: CRR required this visit?  No   CCM required this visit?  No      Plan:     I have personally reviewed and noted the following in the patient's chart:   . Medical and social history . Use of alcohol, tobacco or illicit drugs  . Current medications and supplements . Functional ability and status . Nutritional status . Physical activity . Advanced directives . List of other physicians . Hospitalizations, surgeries, and ER visits in previous 12 months . Vitals . Screenings to include cognitive, depression, and falls . Referrals and appointments  In addition, I have reviewed and discussed with patient certain preventive protocols, quality metrics, and best practice  recommendations. A written personalized care plan for preventive services as well as general preventive health recommendations were provided to patient.   Patient would like to access avs on mychart.  Marta Antu, LPN   [id number]  Nurse Health Advisor  Nurse Notes: None

## 2020-11-26 NOTE — Patient Instructions (Signed)
Daisy Bennett , Thank you for taking time to come for your Medicare Wellness Visit. I appreciate your ongoing commitment to your health goals. Please review the following plan we discussed and let me know if I can assist you in the future.   Screening recommendations/referrals: Colonoscopy: Completed 05/28/2019-Due 05/27/2022 Mammogram: Completed at GYN. Awaiting results. Bone Density: Due-Please call the office when you are ready to schedule. Recommended yearly ophthalmology/optometry visit for glaucoma screening and checkup Recommended yearly dental visit for hygiene and checkup  Vaccinations: Influenza vaccine: Up to date Pneumococcal vaccine: Completed vaccines Tdap vaccine: Up to date-Due 12/27/2023 Shingles vaccine: Discuss with pharmacy   Covid-19:Up to date  Advanced directives: Please bring a copy for your chart.  Conditions/risks identified: See problem list  Next appointment: Follow up in one year for your annual wellness visit    Preventive Care 65 Years and Older, Female Preventive care refers to lifestyle choices and visits with your health care provider that can promote health and wellness. What does preventive care include?  A yearly physical exam. This is also called an annual well check.  Dental exams once or twice a year.  Routine eye exams. Ask your health care provider how often you should have your eyes checked.  Personal lifestyle choices, including:  Daily care of your teeth and gums.  Regular physical activity.  Eating a healthy diet.  Avoiding tobacco and drug use.  Limiting alcohol use.  Practicing safe sex.  Taking low-dose aspirin every day.  Taking vitamin and mineral supplements as recommended by your health care provider. What happens during an annual well check? The services and screenings done by your health care provider during your annual well check will depend on your age, overall health, lifestyle risk factors, and family history  of disease. Counseling  Your health care provider may ask you questions about your:  Alcohol use.  Tobacco use.  Drug use.  Emotional well-being.  Home and relationship well-being.  Sexual activity.  Eating habits.  History of falls.  Memory and ability to understand (cognition).  Work and work Statistician.  Reproductive health. Screening  You may have the following tests or measurements:  Height, weight, and BMI.  Blood pressure.  Lipid and cholesterol levels. These may be checked every 5 years, or more frequently if you are over 63 years old.  Skin check.  Lung cancer screening. You may have this screening every year starting at age 69 if you have a 30-pack-year history of smoking and currently smoke or have quit within the past 15 years.  Fecal occult blood test (FOBT) of the stool. You may have this test every year starting at age 85.  Flexible sigmoidoscopy or colonoscopy. You may have a sigmoidoscopy every 5 years or a colonoscopy every 10 years starting at age 58.  Hepatitis C blood test.  Hepatitis B blood test.  Sexually transmitted disease (STD) testing.  Diabetes screening. This is done by checking your blood sugar (glucose) after you have not eaten for a while (fasting). You may have this done every 1-3 years.  Bone density scan. This is done to screen for osteoporosis. You may have this done starting at age 34.  Mammogram. This may be done every 1-2 years. Talk to your health care provider about how often you should have regular mammograms. Talk with your health care provider about your test results, treatment options, and if necessary, the need for more tests. Vaccines  Your health care provider may recommend certain vaccines, such  as:  Influenza vaccine. This is recommended every year.  Tetanus, diphtheria, and acellular pertussis (Tdap, Td) vaccine. You may need a Td booster every 10 years.  Zoster vaccine. You may need this after age  22.  Pneumococcal 13-valent conjugate (PCV13) vaccine. One dose is recommended after age 10.  Pneumococcal polysaccharide (PPSV23) vaccine. One dose is recommended after age 84. Talk to your health care provider about which screenings and vaccines you need and how often you need them. This information is not intended to replace advice given to you by your health care provider. Make sure you discuss any questions you have with your health care provider. Document Released: 08/15/2015 Document Revised: 04/07/2016 Document Reviewed: 05/20/2015 Elsevier Interactive Patient Education  2017 [employer] Prevention in the Home Falls can cause injuries. They can happen to people of all ages. There are many things you can do to make your home safe and to help prevent falls. What can I do on the outside of my home?  Regularly fix the edges of walkways and driveways and fix any cracks.  Remove anything that might make you trip as you walk through a door, such as a raised step or threshold.  Trim any bushes or trees on the path to your home.  Use bright outdoor lighting.  Clear any walking paths of anything that might make someone trip, such as rocks or tools.  Regularly check to see if handrails are loose or broken. Make sure that both sides of any steps have handrails.  Any raised decks and porches should have guardrails on the edges.  Have any leaves, snow, or ice cleared regularly.  Use sand or salt on walking paths during winter.  Clean up any spills in your garage right away. This includes oil or grease spills. What can I do in the bathroom?  Use night lights.  Install grab bars by the toilet and in the tub and shower. Do not use towel bars as grab bars.  Use non-skid mats or decals in the tub or shower.  If you need to sit down in the shower, use a plastic, non-slip stool.  Keep the floor dry. Clean up any water that spills on the floor as soon as it happens.  Remove  soap buildup in the tub or shower regularly.  Attach bath mats securely with double-sided non-slip rug tape.  Do not have throw rugs and other things on the floor that can make you trip. What can I do in the bedroom?  Use night lights.  Make sure that you have a light by your bed that is easy to reach.  Do not use any sheets or blankets that are too big for your bed. They should not hang down onto the floor.  Have a firm chair that has side arms. You can use this for support while you get dressed.  Do not have throw rugs and other things on the floor that can make you trip. What can I do in the kitchen?  Clean up any spills right away.  Avoid walking on wet floors.  Keep items that you use a lot in easy-to-reach places.  If you need to reach something above you, use a strong step stool that has a grab bar.  Keep electrical cords out of the way.  Do not use floor polish or wax that makes floors slippery. If you must use wax, use non-skid floor wax.  Do not have throw rugs and other things on the  floor that can make you trip. What can I do with my stairs?  Do not leave any items on the stairs.  Make sure that there are handrails on both sides of the stairs and use them. Fix handrails that are broken or loose. Make sure that handrails are as long as the stairways.  Check any carpeting to make sure that it is firmly attached to the stairs. Fix any carpet that is loose or worn.  Avoid having throw rugs at the top or bottom of the stairs. If you do have throw rugs, attach them to the floor with carpet tape.  Make sure that you have a light switch at the top of the stairs and the bottom of the stairs. If you do not have them, ask someone to add them for you. What else can I do to help prevent falls?  Wear shoes that:  Do not have high heels.  Have rubber bottoms.  Are comfortable and fit you well.  Are closed at the toe. Do not wear sandals.  If you use a  stepladder:  Make sure that it is fully opened. Do not climb a closed stepladder.  Make sure that both sides of the stepladder are locked into place.  Ask someone to hold it for you, if possible.  Clearly mark and make sure that you can see:  Any grab bars or handrails.  First and last steps.  Where the edge of each step is.  Use tools that help you move around (mobility aids) if they are needed. These include:  Canes.  Walkers.  Scooters.  Crutches.  Turn on the lights when you go into a dark area. Replace any light bulbs as soon as they burn out.  Set up your furniture so you have a clear path. Avoid moving your furniture around.  If any of your floors are uneven, fix them.  If there are any pets around you, be aware of where they are.  Review your medicines with your doctor. Some medicines can make you feel dizzy. This can increase your chance of falling. Ask your doctor what other things that you can do to help prevent falls. This information is not intended to replace advice given to you by your health care provider. Make sure you discuss any questions you have with your health care provider. Document Released: 05/15/2009 Document Revised: 12/25/2015 Document Reviewed: 08/23/2014 Elsevier Interactive Patient Education  2017 [employer].

## 2020-12-07 ENCOUNTER — Other Ambulatory Visit: Payer: Self-pay | Admitting: Cardiovascular Disease

## 2020-12-17 ENCOUNTER — Other Ambulatory Visit (INDEPENDENT_AMBULATORY_CARE_PROVIDER_SITE_OTHER): Payer: Self-pay

## 2020-12-17 DIAGNOSIS — G35 Multiple sclerosis: Secondary | ICD-10-CM

## 2020-12-17 DIAGNOSIS — Z0289 Encounter for other administrative examinations: Secondary | ICD-10-CM

## 2020-12-17 DIAGNOSIS — Z5181 Encounter for therapeutic drug level monitoring: Secondary | ICD-10-CM

## 2020-12-18 ENCOUNTER — Other Ambulatory Visit: Payer: Self-pay | Admitting: Neurology

## 2020-12-18 DIAGNOSIS — G35 Multiple sclerosis: Secondary | ICD-10-CM

## 2020-12-18 LAB — CBC WITH DIFFERENTIAL/PLATELET
Basophils Absolute: 0.1 10*3/uL (ref 0.0–0.2)
Basos: 1 %
EOS (ABSOLUTE): 0.9 10*3/uL — ABNORMAL HIGH (ref 0.0–0.4)
Eos: 11 %
Hematocrit: 34.7 % (ref 34.0–46.6)
Hemoglobin: 11.4 g/dL (ref 11.1–15.9)
Immature Grans (Abs): 0 10*3/uL (ref 0.0–0.1)
Immature Granulocytes: 0 %
Lymphocytes Absolute: 0.8 10*3/uL (ref 0.7–3.1)
Lymphs: 9 %
MCH: 32.7 pg (ref 26.6–33.0)
MCHC: 32.9 g/dL (ref 31.5–35.7)
MCV: 99 fL — ABNORMAL HIGH (ref 79–97)
Monocytes Absolute: 0.8 10*3/uL (ref 0.1–0.9)
Monocytes: 9 %
Neutrophils Absolute: 6.1 10*3/uL (ref 1.4–7.0)
Neutrophils: 70 %
Platelets: 128 10*3/uL — ABNORMAL LOW (ref 150–450)
RBC: 3.49 x10E6/uL — ABNORMAL LOW (ref 3.77–5.28)
RDW: 11.9 % (ref 11.7–15.4)
WBC: 8.6 10*3/uL (ref 3.4–10.8)

## 2020-12-18 LAB — HEPATIC FUNCTION PANEL
ALT: 19 IU/L (ref 0–32)
AST: 21 IU/L (ref 0–40)
Albumin: 4.1 g/dL (ref 3.8–4.8)
Alkaline Phosphatase: 80 IU/L (ref 44–121)
Bilirubin Total: 0.3 mg/dL (ref 0.0–1.2)
Bilirubin, Direct: 0.16 mg/dL (ref 0.00–0.40)
Total Protein: 6.5 g/dL (ref 6.0–8.5)

## 2020-12-22 ENCOUNTER — Other Ambulatory Visit: Payer: Self-pay

## 2020-12-22 DIAGNOSIS — Z5181 Encounter for therapeutic drug level monitoring: Secondary | ICD-10-CM

## 2020-12-31 ENCOUNTER — Other Ambulatory Visit: Payer: Self-pay | Admitting: Family Medicine

## 2021-01-01 ENCOUNTER — Other Ambulatory Visit: Payer: Self-pay | Admitting: Neurology

## 2021-01-13 ENCOUNTER — Other Ambulatory Visit (INDEPENDENT_AMBULATORY_CARE_PROVIDER_SITE_OTHER): Payer: Medicare Other

## 2021-01-13 ENCOUNTER — Other Ambulatory Visit: Payer: Self-pay

## 2021-01-13 DIAGNOSIS — G35 Multiple sclerosis: Secondary | ICD-10-CM

## 2021-01-13 DIAGNOSIS — Z5181 Encounter for therapeutic drug level monitoring: Secondary | ICD-10-CM

## 2021-01-13 DIAGNOSIS — Z0289 Encounter for other administrative examinations: Secondary | ICD-10-CM

## 2021-01-14 LAB — CBC WITH DIFFERENTIAL/PLATELET
Basophils Absolute: 0.1 10*3/uL (ref 0.0–0.2)
Basos: 1 %
EOS (ABSOLUTE): 0.8 10*3/uL — ABNORMAL HIGH (ref 0.0–0.4)
Eos: 11 %
Hematocrit: 33.9 % — ABNORMAL LOW (ref 34.0–46.6)
Hemoglobin: 11.4 g/dL (ref 11.1–15.9)
Immature Grans (Abs): 0 10*3/uL (ref 0.0–0.1)
Immature Granulocytes: 0 %
Lymphocytes Absolute: 0.7 10*3/uL (ref 0.7–3.1)
Lymphs: 10 %
MCH: 32.2 pg (ref 26.6–33.0)
MCHC: 33.6 g/dL (ref 31.5–35.7)
MCV: 96 fL (ref 79–97)
Monocytes Absolute: 0.6 10*3/uL (ref 0.1–0.9)
Monocytes: 9 %
Neutrophils Absolute: 5.3 10*3/uL (ref 1.4–7.0)
Neutrophils: 69 %
Platelets: 144 10*3/uL — ABNORMAL LOW (ref 150–450)
RBC: 3.54 x10E6/uL — ABNORMAL LOW (ref 3.77–5.28)
RDW: 12.3 % (ref 11.7–15.4)
WBC: 7.6 10*3/uL (ref 3.4–10.8)

## 2021-01-14 LAB — HEPATIC FUNCTION PANEL
ALT: 25 IU/L (ref 0–32)
AST: 30 IU/L (ref 0–40)
Albumin: 4.3 g/dL (ref 3.8–4.8)
Alkaline Phosphatase: 69 IU/L (ref 44–121)
Bilirubin Total: 0.3 mg/dL (ref 0.0–1.2)
Bilirubin, Direct: 0.15 mg/dL (ref 0.00–0.40)
Total Protein: 6.6 g/dL (ref 6.0–8.5)

## 2021-01-15 ENCOUNTER — Encounter: Payer: Self-pay | Admitting: Neurology

## 2021-01-21 ENCOUNTER — Other Ambulatory Visit: Payer: Self-pay | Admitting: Cardiovascular Disease

## 2021-01-23 ENCOUNTER — Other Ambulatory Visit: Payer: Self-pay | Admitting: Family Medicine

## 2021-02-09 ENCOUNTER — Other Ambulatory Visit: Payer: Self-pay | Admitting: Neurology

## 2021-02-09 ENCOUNTER — Other Ambulatory Visit (INDEPENDENT_AMBULATORY_CARE_PROVIDER_SITE_OTHER): Payer: Medicare Other

## 2021-02-09 DIAGNOSIS — Z5181 Encounter for therapeutic drug level monitoring: Secondary | ICD-10-CM

## 2021-02-09 DIAGNOSIS — Z0289 Encounter for other administrative examinations: Secondary | ICD-10-CM

## 2021-02-09 DIAGNOSIS — G35 Multiple sclerosis: Secondary | ICD-10-CM

## 2021-02-10 ENCOUNTER — Telehealth: Payer: Self-pay | Admitting: Neurology

## 2021-02-10 DIAGNOSIS — G35 Multiple sclerosis: Secondary | ICD-10-CM

## 2021-02-10 DIAGNOSIS — Z5181 Encounter for therapeutic drug level monitoring: Secondary | ICD-10-CM

## 2021-02-10 LAB — CBC WITH DIFFERENTIAL/PLATELET
Basophils Absolute: 0 10*3/uL (ref 0.0–0.2)
Basos: 1 %
EOS (ABSOLUTE): 1 10*3/uL — ABNORMAL HIGH (ref 0.0–0.4)
Eos: 16 %
Hematocrit: 37.2 % (ref 34.0–46.6)
Hemoglobin: 12.3 g/dL (ref 11.1–15.9)
Immature Grans (Abs): 0 10*3/uL (ref 0.0–0.1)
Immature Granulocytes: 0 %
Lymphocytes Absolute: 0.6 10*3/uL — ABNORMAL LOW (ref 0.7–3.1)
Lymphs: 9 %
MCH: 31.7 pg (ref 26.6–33.0)
MCHC: 33.1 g/dL (ref 31.5–35.7)
MCV: 96 fL (ref 79–97)
Monocytes Absolute: 0.6 10*3/uL (ref 0.1–0.9)
Monocytes: 9 %
Neutrophils Absolute: 4 10*3/uL (ref 1.4–7.0)
Neutrophils: 65 %
Platelets: 131 10*3/uL — ABNORMAL LOW (ref 150–450)
RBC: 3.88 x10E6/uL (ref 3.77–5.28)
RDW: 12.3 % (ref 11.7–15.4)
WBC: 6.2 10*3/uL (ref 3.4–10.8)

## 2021-02-10 LAB — HEPATIC FUNCTION PANEL
ALT: 37 IU/L — ABNORMAL HIGH (ref 0–32)
AST: 55 IU/L — ABNORMAL HIGH (ref 0–40)
Albumin: 4.1 g/dL (ref 3.8–4.8)
Alkaline Phosphatase: 58 IU/L (ref 44–121)
Bilirubin Total: 0.4 mg/dL (ref 0.0–1.2)
Bilirubin, Direct: 0.15 mg/dL (ref 0.00–0.40)
Total Protein: 7 g/dL (ref 6.0–8.5)

## 2021-02-10 NOTE — Telephone Encounter (Signed)
I called the patient. Recent labs on Aubagio (started in March 2022). Increase in AST 55, ALT 37, were normal last month. Reports only alcohol use on weekends, none more than normal. No tylenol use. Absol lymph count slight dip 0.6, EOS 1.0. Discussed with Dr. Felecia Shelling, EOS likely not significant, there is no itching or other symptoms. Will recheck CBC with diff, LFT in 2 weeks to ensure liver enzymes are not further increasing on Aubagio. I sent myself a reminder and placed the orders.

## 2021-02-11 LAB — HM DIABETES EYE EXAM

## 2021-02-21 ENCOUNTER — Other Ambulatory Visit: Payer: Self-pay | Admitting: Cardiovascular Disease

## 2021-02-24 ENCOUNTER — Telehealth: Payer: Self-pay | Admitting: Neurology

## 2021-02-24 ENCOUNTER — Encounter: Payer: Self-pay | Admitting: *Deleted

## 2021-02-24 NOTE — Telephone Encounter (Deleted)
-----   Message from [name], NP sent at 02/10/2021  2:52 PM EDT ----- Come back for 2 week recheck of CBC, LFT with Aubagio

## 2021-02-24 NOTE — Telephone Encounter (Signed)
I left a detailed message on her voicemail (ok per DPR) and sent a mychart message.

## 2021-02-24 NOTE — Telephone Encounter (Signed)
Please ask to come in for labs, 2 week recheck for LFTs, will be CBC, Hepatic function panel on Augagio, see telephone note 712/22. Thanks.

## 2021-02-26 ENCOUNTER — Other Ambulatory Visit (INDEPENDENT_AMBULATORY_CARE_PROVIDER_SITE_OTHER): Payer: Self-pay

## 2021-02-26 DIAGNOSIS — Z5181 Encounter for therapeutic drug level monitoring: Secondary | ICD-10-CM

## 2021-02-26 DIAGNOSIS — G35 Multiple sclerosis: Secondary | ICD-10-CM

## 2021-02-26 DIAGNOSIS — Z0289 Encounter for other administrative examinations: Secondary | ICD-10-CM

## 2021-02-27 LAB — CBC WITH DIFFERENTIAL/PLATELET
Basophils Absolute: 0.1 10*3/uL (ref 0.0–0.2)
Basos: 1 %
EOS (ABSOLUTE): 0.9 10*3/uL — ABNORMAL HIGH (ref 0.0–0.4)
Eos: 11 %
Hematocrit: 35 % (ref 34.0–46.6)
Hemoglobin: 11.3 g/dL (ref 11.1–15.9)
Immature Grans (Abs): 0 10*3/uL (ref 0.0–0.1)
Immature Granulocytes: 0 %
Lymphocytes Absolute: 0.7 10*3/uL (ref 0.7–3.1)
Lymphs: 9 %
MCH: 31.5 pg (ref 26.6–33.0)
MCHC: 32.3 g/dL (ref 31.5–35.7)
MCV: 98 fL — ABNORMAL HIGH (ref 79–97)
Monocytes Absolute: 0.7 10*3/uL (ref 0.1–0.9)
Monocytes: 9 %
Neutrophils Absolute: 5.4 10*3/uL (ref 1.4–7.0)
Neutrophils: 70 %
Platelets: 138 10*3/uL — ABNORMAL LOW (ref 150–450)
RBC: 3.59 x10E6/uL — ABNORMAL LOW (ref 3.77–5.28)
RDW: 11.8 % (ref 11.7–15.4)
WBC: 7.8 10*3/uL (ref 3.4–10.8)

## 2021-02-27 LAB — HEPATIC FUNCTION PANEL
ALT: 19 IU/L (ref 0–32)
AST: 24 IU/L (ref 0–40)
Albumin: 4.1 g/dL (ref 3.8–4.8)
Alkaline Phosphatase: 69 IU/L (ref 44–121)
Bilirubin Total: 0.2 mg/dL (ref 0.0–1.2)
Bilirubin, Direct: 0.13 mg/dL (ref 0.00–0.40)
Total Protein: 6.4 g/dL (ref 6.0–8.5)

## 2021-03-03 ENCOUNTER — Other Ambulatory Visit: Payer: Self-pay

## 2021-03-03 ENCOUNTER — Ambulatory Visit (INDEPENDENT_AMBULATORY_CARE_PROVIDER_SITE_OTHER): Payer: Medicare Other | Admitting: Family Medicine

## 2021-03-03 ENCOUNTER — Encounter: Payer: Self-pay | Admitting: Family Medicine

## 2021-03-03 VITALS — BP 145/79 | HR 68 | Temp 98.3°F | Ht 66.0 in | Wt 177.0 lb

## 2021-03-03 DIAGNOSIS — Z23 Encounter for immunization: Secondary | ICD-10-CM

## 2021-03-03 DIAGNOSIS — G35 Multiple sclerosis: Secondary | ICD-10-CM | POA: Diagnosis not present

## 2021-03-03 DIAGNOSIS — I10 Essential (primary) hypertension: Secondary | ICD-10-CM

## 2021-03-03 DIAGNOSIS — E782 Mixed hyperlipidemia: Secondary | ICD-10-CM | POA: Diagnosis not present

## 2021-03-03 DIAGNOSIS — I25118 Atherosclerotic heart disease of native coronary artery with other forms of angina pectoris: Secondary | ICD-10-CM

## 2021-03-03 DIAGNOSIS — E039 Hypothyroidism, unspecified: Secondary | ICD-10-CM | POA: Diagnosis not present

## 2021-03-03 DIAGNOSIS — N1832 Chronic kidney disease, stage 3b: Secondary | ICD-10-CM

## 2021-03-03 DIAGNOSIS — D6869 Other thrombophilia: Secondary | ICD-10-CM

## 2021-03-03 DIAGNOSIS — E118 Type 2 diabetes mellitus with unspecified complications: Secondary | ICD-10-CM | POA: Diagnosis not present

## 2021-03-03 DIAGNOSIS — E119 Type 2 diabetes mellitus without complications: Secondary | ICD-10-CM

## 2021-03-03 LAB — POCT GLYCOSYLATED HEMOGLOBIN (HGB A1C)
HbA1c POC (<> result, manual entry): 8.6 % (ref 4.0–5.6)
HbA1c, POC (controlled diabetic range): 8.6 % — AB (ref 0.0–7.0)
HbA1c, POC (prediabetic range): 8.6 % — AB (ref 5.7–6.4)
Hemoglobin A1C: 8.6 % — AB (ref 4.0–5.6)

## 2021-03-03 MED ORDER — CARVEDILOL 12.5 MG PO TABS: 12.5000 mg | ORAL_TABLET | Freq: Two times a day (BID) | ORAL | 1 refills | Status: DC

## 2021-03-03 MED ORDER — PANTOPRAZOLE SODIUM 20 MG PO TBEC: 20.0000 mg | DELAYED_RELEASE_TABLET | Freq: Every day | ORAL | 1 refills | Status: DC

## 2021-03-03 MED ORDER — POTASSIUM CHLORIDE ER 10 MEQ PO TBCR: 10.0000 meq | EXTENDED_RELEASE_TABLET | Freq: Every day | ORAL | 1 refills | Status: DC

## 2021-03-03 MED ORDER — MIRTAZAPINE 15 MG PO TABS: 15.0000 mg | ORAL_TABLET | Freq: Every day | ORAL | 1 refills | Status: DC

## 2021-03-03 MED ORDER — VENLAFAXINE HCL ER 75 MG PO CP24: 225.0000 mg | ORAL_CAPSULE | Freq: Every day | ORAL | 1 refills | Status: DC

## 2021-03-03 MED ORDER — LEVOTHYROXINE SODIUM 112 MCG PO TABS: 112.0000 ug | ORAL_TABLET | Freq: Every day | ORAL | 3 refills | Status: DC

## 2021-03-03 MED ORDER — HYDROCHLOROTHIAZIDE 50 MG PO TABS: 50.0000 mg | ORAL_TABLET | Freq: Every day | ORAL | 1 refills | Status: DC

## 2021-03-03 MED ORDER — METFORMIN HCL 1000 MG PO TABS: 1000.0000 mg | ORAL_TABLET | Freq: Every day | ORAL | 1 refills | Status: DC

## 2021-03-03 MED ORDER — GLIMEPIRIDE 4 MG PO TABS: ORAL_TABLET | ORAL | 1 refills | Status: DC

## 2021-03-03 MED ORDER — FENOFIBRATE 160 MG PO TABS: 160.0000 mg | ORAL_TABLET | Freq: Every day | ORAL | 3 refills | Status: DC

## 2021-03-03 NOTE — Progress Notes (Signed)
This visit occurred during the SARS-CoV-2 public health emergency.  Safety protocols were in place, including screening questions prior to the visit, additional usage of staff PPE, and extensive cleaning of exam room while observing appropriate contact time as indicated for disinfecting solutions.    Daisy Bennett , 22-May-1956, 65 y.o., female MRN: [id number] Patient Care Team    Relationship Specialty Notifications Start End  Ma Hillock, DO PCP - General Family Medicine  03/10/15   Josue Hector, MD PCP - Cardiology Cardiology Admissions 05/31/18   [place], Jennefer Bravo, MD Referring Physician Dermatology  02/10/16   Kathrynn Ducking, MD Consulting Physician Neurology  05/18/16   Linda Hedges, [place] Physician Obstetrics and Gynecology  06/04/16   Anell Barr, OD  Optometry  08/17/16     Chief Complaint  Patient presents with   Diabetes    [place]; pt is not fasting     Subjective: .Daisy Bennett is a 65 y.o. female present for [facility] follow up. Diabetes/morbid obesity: She reports  clients with Amaryl  4 mg/'1mg'$  daily and metformin 1000 mg qd and she is tolerating them. She is still  not checking her sugars. Patient denies dizziness, hyperglycemic or hypoglycemic events. Patient denies numbness, tingling in the extremities or nonhealing wounds of feet.  She is prescribed gabapentin 600 mg BID by her neurologist.   POCT HgB A1C-->  6.5 >6.4--> 6.3>>> 7.2>>6.0>>6.0 > 6.4> 5.9 > 6.6> 8.6 today - PSV 10/2016--> pna20 completed 03/03/2021 -  foot exam 03/03/2021 - Eye exam reports eye exam 01/2021 - flu shot UTD   Hypertension/hyperlipidemia: She reports compliance with her medication regimen of Crestor 10 mg daily, Effient 10 mg daily, losartan 100 mg daily, HCTZ 50 mg daily, fish oil 1000 mg daily, fenofibrate 160 mg daily Norvasc 10 mg, baby aspirin daily, Coreg 6.25 mg twice daily- prescribed by her cardiologist (with the exception of HCTZ). .  H/O CAD, NSTEMI, post stent  2011 ([facility])-collateralized RCA.  She follows with Dr. Johnsie Cancel. Patient denies chest pain, shortness of breath, dizziness or lower extremity edema.  Diet: low sodium Exercise: routine   depression/MS:  Patient reports her anxiety has increased since she stopped the elavil. .  She is compliant  with Effexor at 225 mg daily. She does have difficulty sleeping without it.  She has started to work with physical therapy with her unsteady gait 2/2 to MS. She has had multiple falls with injuries.      Depression screen [facility] 2/9 11/26/2020 07/09/2020 03/25/2020 07/04/2019 02/15/2018  Decreased Interest 0 0 0 0 0  Down, Depressed, Hopeless 0 0 0 0 0  PHQ - 2 Score 0 0 0 0 0  Altered sleeping - - - - -  Tired, decreased energy - - - - -  Change in appetite - - - - -  Feeling bad or failure about yourself  - - - - -  Trouble concentrating - - - - -  Moving slowly or fidgety/restless - - - - -  Suicidal thoughts - - - - -  PHQ-9 Score - - - - -  Some recent data might be hidden    Allergies  Allergen Reactions   Betaseron [Interferon Beta-1b] Other (See Comments)    Increased LFT's   Provigil [Modafinil] Swelling    Tongue swelling and sores   Topamax [Topiramate]     Cognitive slowing   Doxycycline Other (See Comments)    Thrush,dizziness   Social  History   Social History Narrative   The patient lives with husband. 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    Patient drinks occasionally, does not chew tobacco, does not use recreational drugs. She does drink occasional caffeine. She does wear her seatbelt. She does wear bike helmet riding a bike. She does exercise 3 times a week. She does not wear hearing aids or dentures. There is a smoke alarm in her home. There are no firearms in her home. She feels safe in her relationship. She has never experienced physical abuse.   Patient sleeps 7-8 hours a night.   Patient drinks 2 cups of caffeine daily.   Patient is right handed.    Past Medical History:  Diagnosis Date   Basal cell carcinoma    Brain bleed ([place]) 2017   COVID-19 08/2019   DM (diabetes mellitus) ([place])    Former smoker    Glossitis, benign migratory 08/31/2019   Goiter, unspecified    History of colonic polyps 2012   Multiple sclerosis (HCC)    Non-ST elevated myocardial infarction (non-STEMI) (HCC)    Olecranon bursitis of left elbow 10/17/2017   Oral mucositis 08/31/2019   Oral mucositis 08/31/2019   SCCA (squamous cell carcinoma) of skin    right shin   SEMI (subendocardial myocardial infarction) ([place]) 09/29/09   TOTAL RCA WITH STENT TO OM BRANCH   Shingles    Squamous cell carcinoma 2019   lower ext   Traumatic rupture of volar plate of left ring finger 03/25/2020   Unspecified essential hypertension    Unspecified hypothyroidism    Past Surgical History:  Procedure Laterality Date   CATARACT SURGERY  12/10   CESAREAN SECTION     CORONARY ANGIOPLASTY WITH STENT PLACEMENT  2011   KNEE SURGERY     leg surgery     squamous ccell removed    TONSILLECTOMY     VESICOVAGINAL FISTULA CLOSURE W/ TAH     Family History  Problem Relation Age of Onset   Hypertension Mother    Heart failure Father    Diabetes Father    Aneurysm Brother        THORACIC/ABD ANEURYSM   Prostate cancer Brother 24   Diabetes Brother    Fibromyalgia Sister    Brain cancer Maternal Grandmother 36   Multiple sclerosis Neg Hx    Colon cancer Neg Hx    Esophageal cancer Neg Hx    Allergies as of 03/03/2021       Reactions   Betaseron [interferon Beta-1b] Other (See Comments)   Increased LFT's   Provigil [modafinil] Swelling   Tongue swelling and sores   Topamax [topiramate]    Cognitive slowing   Doxycycline Other (See Comments)   Thrush,dizziness        Medication List        Accurate as of March 03, 2021 10:46 AM. If you have any questions, ask your nurse or doctor.          STOP taking these medications    triamcinolone 55 MCG/ACT Aero  nasal inhaler Commonly known as: NASACORT Stopped by: Howard Pouch, DO       TAKE these medications    amLODipine 10 MG tablet Commonly known as: NORVASC TAKE 1 TABLET BY MOUTH EVERY DAY   aspirin EC 81 MG tablet Take 1 tablet (81 mg total) daily by mouth.   Aubagio 14 MG Tabs Generic drug: Teriflunomide Take by mouth.   B  COMPLEX-B12 PO Take by mouth.   carvedilol 12.5 MG tablet Commonly known as: COREG Take 1 tablet (12.5 mg total) by mouth 2 (two) times daily.   cholecalciferol 1000 units tablet Commonly known as: VITAMIN D Take 1,000 Units by mouth daily.   fenofibrate 160 MG tablet Take 1 tablet (160 mg total) by mouth daily.   fish oil-omega-3 fatty acids 1000 MG capsule Take 1 g by mouth daily.   gabapentin 600 MG tablet Commonly known as: NEURONTIN TAKE 1 TABLET BY MOUTH THREE TIMES A DAY   glimepiride 4 MG tablet Commonly known as: AMARYL 1 tab BID What changed:  medication strength additional instructions Changed by: Howard Pouch, DO   hydrochlorothiazide 50 MG tablet Commonly known as: HYDRODIURIL Take 1 tablet (50 mg total) by mouth daily.   Iron 325 (65 Fe) MG Tabs Take 325 mg by mouth daily.   levothyroxine 112 MCG tablet Commonly known as: SYNTHROID Take 1 tablet (112 mcg total) by mouth daily.   losartan 100 MG tablet Commonly known as: COZAAR TAKE 1 TABLET BY MOUTH DAILY. PLEASE KEEP UPCOMING APPOINTMENT IN APRIL 2022 FOR FUTURE REFILLS. THANK YOU   metFORMIN 1000 MG tablet Commonly known as: GLUCOPHAGE Take 1 tablet (1,000 mg total) by mouth daily with breakfast.   mirtazapine 15 MG tablet Commonly known as: Remeron Take 1 tablet (15 mg total) by mouth at bedtime.   nitroGLYCERIN 0.4 MG SL tablet Commonly known as: Nitrostat Place 1 tablet (0.4 mg total) under the tongue every 5 (five) minutes x 3 doses as needed for chest pain.   pantoprazole 20 MG tablet Commonly known as: PROTONIX Take 1 tablet (20 mg total) by mouth  daily.   potassium chloride 10 MEQ tablet Commonly known as: KLOR-CON Take 1 tablet (10 mEq total) by mouth daily.   prasugrel 10 MG Tabs tablet Commonly known as: EFFIENT Take 1 tablet (10 mg total) by mouth daily.   rosuvastatin 10 MG tablet Commonly known as: CRESTOR Take 1 tablet (10 mg total) by mouth daily.   venlafaxine XR 75 MG 24 hr capsule Commonly known as: Effexor XR Take 3 capsules (225 mg total) by mouth daily with breakfast.        All past medical history, surgical history, allergies, family history, immunizations andmedications were updated in the EMR today and reviewed under the history and medication portions of their EMR.     ROS: Negative, with the exception of above mentioned in HPI   Objective:  BP (!) 145/79   Pulse 68   Temp 98.3 F (36.8 C) (Oral)   Ht '5\' 6"'$  (1.676 m)   Wt 177 lb (80.3 kg)   SpO2 100%   BMI 28.57 kg/m  Body mass index is 28.57 kg/m. Gen: Afebrile. No acute distress. Nontoxic, pleasant female.  HENT: AT. .  Eyes:Pupils Equal Round Reactive to light, Extraocular movements intact,  Conjunctiva without redness, discharge or icterus. Neck/lymp/endocrine: Supple,no lymphadenopathy, no thyromegaly CV: RRR no murmur, no edema, +2/4 P posterior tibialis pulses Chest: CTAB, no wheeze or crackles Skin: no rashes, purpura or petechiae.  Neuro:  Normal gait. PERLA. EOMi. Alert. Oriented x3 Psych: Normal affect, dress and demeanor. Normal speech. Normal thought content and judgment. Diabetic Foot Exam - Simple   Simple Foot Form Diabetic Foot exam was performed with the following findings: Yes 03/03/2021 10:26 AM  Visual Inspection No deformities, no ulcerations, no other skin breakdown bilaterally: Yes Sensation Testing Intact to touch and monofilament testing bilaterally: Yes Pulse  Check Posterior Tibialis and Dorsalis pulse intact bilaterally: Yes Comments        No results found. No results found. No results found for  this or any previous visit (from the past 24 hour(s)).  Assessment/Plan: Daisy Bennett is a 65 y.o. female present for OV for [facility] Diabetes mellitus with complication (HCC)/Morbid obesity - much higher than her usual.  - increase   Amaryl to 4 mg BID - continue  Metformin 1000 mg daily (decreased secondary to kidney function) - increase exercise regimen and continue dietary modification> cut back on sugary drinks. - continue  gabapentin supplied by neuro  POCT HgB A1C--> 5.9> 6.1> 6.6> 8.6 today - prevnar 20 completed today -  foot exam 03/03/2021 - Eye exam: UTD 02/11/2021 UTD.  Dr. Venetia Constable, yearly eye exams - Microalbumin: on an ARB - flu shot UTD -3- month follow-up   Hypertension/hyperlipidemia/Antiplatelet or antithrombotic long-term use/athersclerosis/hypokalemia/s/p stent placement:  - still above goal- but had not taken all her meds today.  - follows with cardiology, Dr. Johnsie Cancel who prescribes medications.  - low salt, increase exercise.  - continue amlodipine 10, losartan 100, - continue  coreg  12.5 mg BID  - continue hctz 50 mg Qd - continue Kdur 10 meq/d - continue fenofibrate - continue  statin - continue  Effient and baby aspirin.  Cardiology feels she needs to continue both. - maintain routine cardiology follow ups.     Depression/hypothyroid/Multiple sclerosis (HCC) Stable.  - continue  Effexor 225 mg daily - continue gabapentin-prescribed by neurology - aubagio prescribed by neurology.  - continue remeron 15 mg nightly - continue levo 112    Next appt 3 mos for cmc  Reviewed expectations re: course of current medical issues. Discussed self-management of symptoms. Outlined signs and symptoms indicating need for more acute intervention. Patient verbalized understanding and all questions were answered. Patient received an After-Visit Summary.    Orders Placed This Encounter  Procedures   Pneumococcal conjugate vaccine 20-valent (Prevnar 20)    POCT HgB A1C    Meds ordered this encounter  Medications   carvedilol (COREG) 12.5 MG tablet    Sig: Take 1 tablet (12.5 mg total) by mouth 2 (two) times daily.    Dispense:  180 tablet    Refill:  1   fenofibrate 160 MG tablet    Sig: Take 1 tablet (160 mg total) by mouth daily.    Dispense:  90 tablet    Refill:  3   glimepiride (AMARYL) 4 MG tablet    Sig: 1 tab BID    Dispense:  180 tablet    Refill:  1   hydrochlorothiazide (HYDRODIURIL) 50 MG tablet    Sig: Take 1 tablet (50 mg total) by mouth daily.    Dispense:  90 tablet    Refill:  1   metFORMIN (GLUCOPHAGE) 1000 MG tablet    Sig: Take 1 tablet (1,000 mg total) by mouth daily with breakfast.    Dispense:  90 tablet    Refill:  1    DC prior scripts, dose has been changed.  Thank you   mirtazapine (REMERON) 15 MG tablet    Sig: Take 1 tablet (15 mg total) by mouth at bedtime.    Dispense:  90 tablet    Refill:  1   pantoprazole (PROTONIX) 20 MG tablet    Sig: Take 1 tablet (20 mg total) by mouth daily.    Dispense:  90 tablet    Refill:  1   potassium chloride (KLOR-CON) 10 MEQ tablet    Sig: Take 1 tablet (10 mEq total) by mouth daily.    Dispense:  90 tablet    Refill:  1    Do not send automatic refills.   venlafaxine XR (EFFEXOR XR) 75 MG 24 hr capsule    Sig: Take 3 capsules (225 mg total) by mouth daily with breakfast.    Dispense:  270 capsule    Refill:  1   levothyroxine (SYNTHROID) 112 MCG tablet    Sig: Take 1 tablet (112 mcg total) by mouth daily.    Dispense:  90 tablet    Refill:  3    Referral Orders  No referral(s) requested today     Note is dictated utilizing voice recognition software. Although note has been proof read prior to signing, occasional typographical errors still can be missed. If any questions arise, please do not hesitate to call for verification.   electronically signed by:  Howard Pouch, DO  [place]

## 2021-03-03 NOTE — Patient Instructions (Addendum)
We had to increase your amaryl today to 4 mg twice a day.  A1c was 8.6...[name] goal < 6.5  Next appt 3 mos.   Diabetes Mellitus and Nutrition, Adult When you have diabetes, or diabetes mellitus, it is very important to have healthy eating habits because your blood sugar (glucose) levels are greatly affected by what you eat and drink. Eating healthy foods in the right amounts, at about the same times every day, can help you: Control your blood glucose. Lower your risk of heart disease. Improve your blood pressure. Reach or maintain a healthy weight. What can affect my meal plan? Every person with diabetes is different, and each person has different needs for a meal plan. Your health care provider may recommend that you work with a dietitian to make a meal plan that is best for you. Your meal plan may vary depending on factors such as: The calories you need. The medicines you take. Your weight. Your blood glucose, blood pressure, and cholesterol levels. Your activity level. Other health conditions you have, such as heart or kidney disease. How do carbohydrates affect me? Carbohydrates, also called carbs, affect your blood glucose level more than any other type of food. Eating carbs naturally raises the amount of glucose in your blood. Carb counting is a method for keeping track of how many carbs you eat. Counting carbs is important to keep your blood glucose at a healthy level,especially if you use insulin or take certain oral diabetes medicines. It is important to know how many carbs you can safely have in each meal. This is different for every person. Your dietitian can help you calculate how manycarbs you should have at each meal and for each snack. How does alcohol affect me? Alcohol can cause a sudden decrease in blood glucose (hypoglycemia), especially if you use insulin or take certain oral diabetes medicines. Hypoglycemia can be a life-threatening condition. Symptoms of hypoglycemia, such as  sleepiness, dizziness, and confusion, are similar to symptoms of having too much alcohol. Do not drink alcohol if: Your health care provider tells you not to drink. You are pregnant, may be pregnant, or are planning to become pregnant. If you drink alcohol: Do not drink on an empty stomach. Limit how much you use to: 0-1 drink a day for women. 0-2 drinks a day for men. Be aware of how much alcohol is in your drink. In the [place] one drink equals one 12 oz bottle of beer (355 mL), one 5 oz glass of wine (148 mL), or one 1 oz glass of hard liquor (44 mL). Keep yourself hydrated with water, diet soda, or unsweetened iced tea. Keep in mind that regular soda, juice, and other mixers may contain a lot of sugar and must be counted as carbs. What are tips for following this plan?  Reading food labels Start by checking the serving size on the "Nutrition Facts" label of packaged foods and drinks. The amount of calories, carbs, fats, and other nutrients listed on the label is based on one serving of the item. Many items contain more than one serving per package. Check the total grams (g) of carbs in one serving. You can calculate the number of servings of carbs in one serving by dividing the total carbs by 15. For example, if a food has 30 g of total carbs per serving, it would be equal to 2 servings of carbs. Check the number of grams (g) of saturated fats and trans fats in one serving. Choose foods  that have a low amount or none of these fats. Check the number of milligrams (mg) of salt (sodium) in one serving. Most people should limit total sodium intake to less than 2,300 mg per day. Always check the nutrition information of foods labeled as "low-fat" or "nonfat." These foods may be higher in added sugar or refined carbs and should be avoided. Talk to your dietitian to identify your daily goals for nutrients listed on the label. Shopping Avoid buying canned, pre-made, or processed foods. These foods  tend to be high in fat, sodium, and added sugar. Shop around the outside edge of the grocery store. This is where you will most often find fresh fruits and vegetables, bulk grains, fresh meats, and fresh dairy. Cooking Use low-heat cooking methods, such as baking, instead of high-heat cooking methods like deep frying. Cook using healthy oils, such as olive, canola, or sunflower oil. Avoid cooking with butter, cream, or high-fat meats. Meal planning Eat meals and snacks regularly, preferably at the same times every day. Avoid going long periods of time without eating. Eat foods that are high in fiber, such as fresh fruits, vegetables, beans, and whole grains. Talk with your dietitian about how many servings of carbs you can eat at each meal. Eat 4-6 oz (112-168 g) of lean protein each day, such as lean meat, chicken, fish, eggs, or tofu. One ounce (oz) of lean protein is equal to: 1 oz (28 g) of meat, chicken, or fish. 1 egg.  cup (62 g) of tofu. Eat some foods each day that contain healthy fats, such as avocado, nuts, seeds, and fish. What foods should I eat? Fruits Berries. Apples. Oranges. Peaches. Apricots. Plums. Grapes. Mango. Papaya.Pomegranate. Kiwi. Cherries. Vegetables Lettuce. Spinach. Leafy greens, including kale, chard, collard greens, and mustard greens. Beets. Cauliflower. Cabbage. Broccoli. Carrots. Green beans.Tomatoes. Peppers. Onions. Cucumbers. Brussels sprouts. Grains Whole grains, such as whole-wheat or whole-grain bread, crackers, tortillas,cereal, and pasta. Unsweetened oatmeal. Quinoa. Brown or wild rice. Meats and other proteins Seafood. Poultry without skin. Lean cuts of poultry and beef. Tofu. Nuts. Seeds. Dairy Low-fat or fat-free dairy products such as milk, yogurt, and cheese. The items listed above may not be a complete list of foods and beverages you can eat. Contact a dietitian for more information. What foods should I avoid? Fruits Fruits canned with  syrup. Vegetables Canned vegetables. Frozen vegetables with butter or cream sauce. Grains Refined white flour and flour products such as bread, pasta, snack foods, andcereals. Avoid all processed foods. Meats and other proteins Fatty cuts of meat. Poultry with skin. Breaded or fried meats. Processed meat.Avoid saturated fats. Dairy Full-fat yogurt, cheese, or milk. Beverages Sweetened drinks, such as soda or iced tea. The items listed above may not be a complete list of foods and beverages you should avoid. Contact a dietitian for more information. Questions to ask a health care provider Do I need to meet with a diabetes educator? Do I need to meet with a dietitian? What number can I call if I have questions? When are the best times to check my blood glucose? Where to find more information: American Diabetes Association: diabetes.org Academy of Nutrition and Dietetics: www.eatright.[employer] of Diabetes and Digestive and Kidney Diseases: [employer] Association of Diabetes Care and Education Specialists: www.diabeteseducator.org Summary It is important to have healthy eating habits because your blood sugar (glucose) levels are greatly affected by what you eat and drink. A healthy meal plan will help you control your blood glucose and maintain  a healthy lifestyle. Your health care provider may recommend that you work with a dietitian to make a meal plan that is best for you. Keep in mind that carbohydrates (carbs) and alcohol have immediate effects on your blood glucose levels. It is important to count carbs and to use alcohol carefully. This information is not intended to replace advice given to you by your health care provider. Make sure you discuss any questions you have with your healthcare provider. Document Revised: 06/26/2019 Document Reviewed: 06/26/2019 Elsevier Patient Education  2021 [employer].

## 2021-03-10 ENCOUNTER — Ambulatory Visit: Payer: Medicare Other | Admitting: Family Medicine

## 2021-03-12 ENCOUNTER — Ambulatory Visit: Payer: Medicare Other | Admitting: Family Medicine

## 2021-03-17 ENCOUNTER — Other Ambulatory Visit: Payer: Self-pay

## 2021-03-17 DIAGNOSIS — Z5181 Encounter for therapeutic drug level monitoring: Secondary | ICD-10-CM

## 2021-03-23 ENCOUNTER — Other Ambulatory Visit (INDEPENDENT_AMBULATORY_CARE_PROVIDER_SITE_OTHER): Payer: Self-pay

## 2021-03-23 DIAGNOSIS — Z5181 Encounter for therapeutic drug level monitoring: Secondary | ICD-10-CM

## 2021-03-23 DIAGNOSIS — Z0289 Encounter for other administrative examinations: Secondary | ICD-10-CM

## 2021-03-24 LAB — CBC WITH DIFFERENTIAL/PLATELET
Basophils Absolute: 0 10*3/uL (ref 0.0–0.2)
Basos: 1 %
EOS (ABSOLUTE): 0.6 10*3/uL — ABNORMAL HIGH (ref 0.0–0.4)
Eos: 11 %
Hematocrit: 36 % (ref 34.0–46.6)
Hemoglobin: 11.9 g/dL (ref 11.1–15.9)
Immature Grans (Abs): 0 10*3/uL (ref 0.0–0.1)
Immature Granulocytes: 0 %
Lymphocytes Absolute: 0.6 10*3/uL — ABNORMAL LOW (ref 0.7–3.1)
Lymphs: 10 %
MCH: 31.8 pg (ref 26.6–33.0)
MCHC: 33.1 g/dL (ref 31.5–35.7)
MCV: 96 fL (ref 79–97)
Monocytes Absolute: 0.6 10*3/uL (ref 0.1–0.9)
Monocytes: 10 %
Neutrophils Absolute: 4.1 10*3/uL (ref 1.4–7.0)
Neutrophils: 68 %
Platelets: 138 10*3/uL — ABNORMAL LOW (ref 150–450)
RBC: 3.74 x10E6/uL — ABNORMAL LOW (ref 3.77–5.28)
RDW: 12.1 % (ref 11.7–15.4)
WBC: 6 10*3/uL (ref 3.4–10.8)

## 2021-03-24 LAB — HEPATIC FUNCTION PANEL
ALT: 22 IU/L (ref 0–32)
AST: 26 IU/L (ref 0–40)
Albumin: 4.2 g/dL (ref 3.8–4.8)
Alkaline Phosphatase: 58 IU/L (ref 44–121)
Bilirubin Total: 0.4 mg/dL (ref 0.0–1.2)
Bilirubin, Direct: 0.17 mg/dL (ref 0.00–0.40)
Total Protein: 7 g/dL (ref 6.0–8.5)

## 2021-03-30 ENCOUNTER — Other Ambulatory Visit: Payer: Self-pay | Admitting: Neurology

## 2021-04-02 ENCOUNTER — Ambulatory Visit (INDEPENDENT_AMBULATORY_CARE_PROVIDER_SITE_OTHER): Payer: Medicare Other | Admitting: Family Medicine

## 2021-04-02 ENCOUNTER — Other Ambulatory Visit: Payer: Self-pay

## 2021-04-02 ENCOUNTER — Encounter: Payer: Self-pay | Admitting: Family Medicine

## 2021-04-02 VITALS — BP 118/70 | HR 65 | Temp 98.0°F | Ht 66.0 in | Wt 175.0 lb

## 2021-04-02 DIAGNOSIS — Z5181 Encounter for therapeutic drug level monitoring: Secondary | ICD-10-CM | POA: Diagnosis not present

## 2021-04-02 DIAGNOSIS — R634 Abnormal weight loss: Secondary | ICD-10-CM

## 2021-04-02 DIAGNOSIS — Z23 Encounter for immunization: Secondary | ICD-10-CM

## 2021-04-02 DIAGNOSIS — R197 Diarrhea, unspecified: Secondary | ICD-10-CM

## 2021-04-02 DIAGNOSIS — G35 Multiple sclerosis: Secondary | ICD-10-CM

## 2021-04-02 DIAGNOSIS — Z860100 Personal history of colon polyps, unspecified: Secondary | ICD-10-CM

## 2021-04-02 LAB — BASIC METABOLIC PANEL
BUN: 21 mg/dL (ref 6–23)
CO2: 27 mEq/L (ref 19–32)
Calcium: 9.9 mg/dL (ref 8.4–10.5)
Chloride: 100 mEq/L (ref 96–112)
Creatinine, Ser: 1.17 mg/dL (ref 0.40–1.20)
GFR: 48.51 mL/min — ABNORMAL LOW (ref >60.00)
Glucose, Bld: 193 mg/dL — ABNORMAL HIGH (ref 70–99)
Potassium: 3.6 mEq/L (ref 3.5–5.1)
Sodium: 139 mEq/L (ref 135–145)

## 2021-04-02 LAB — C-REACTIVE PROTEIN: CRP: <1 mg/dL (ref 0.5–20.0)

## 2021-04-02 NOTE — Patient Instructions (Signed)
Diarrhea, Adult Diarrhea is when you pass loose and watery poop (stool) often. Diarrhea can make you feel weak and cause you to lose water in your body (get dehydrated). Losing water in your body can cause you to: Feel tired and thirsty. Have a dry mouth. Go pee (urinate) less often. Diarrhea often lasts 2-3 days. However, it can last longer if it is a sign of something more serious. It is important to treat your diarrhea as told by your doctor. Follow these instructions at home: Eating and drinking   Follow these instructions as told by your doctor: Take an ORS (oral rehydration solution). This is a drink that helps you replace fluids and minerals your body lost. It is sold at pharmacies and stores. Drink plenty of fluids, such as: Water. Ice chips. Diluted fruit juice. Low-calorie sports drinks. Milk, if you want. Avoid drinking fluids that have a lot of sugar or caffeine in them. Eat bland, easy-to-digest foods in small amounts as you are able. These foods include: Bananas. Applesauce. Rice. Low-fat (lean) meats. Toast. Crackers. Avoid alcohol. Avoid spicy or fatty foods.  Medicines Take over-the-counter and prescription medicines only as told by your doctor. If you were prescribed an antibiotic medicine, take it as told by your doctor. Do not stop using the antibiotic even if you start to feel better. General instructions  Wash your hands often using soap and water. If soap and water are not available, use a hand sanitizer. Others in your home should wash their hands as well. Hands should be washed: After using the toilet or changing a diaper. Before preparing, cooking, or serving food. While caring for a sick person. While visiting someone in a hospital. Drink enough fluid to keep your pee (urine) pale yellow. Rest at home while you get better. Watch your condition for any changes. Take a warm bath to help with any burning or pain from having diarrhea. Keep all  follow-up visits as told by your doctor. This is important. Contact a doctor if: You have a fever. Your diarrhea gets worse. You have new symptoms. You cannot keep fluids down. You feel light-headed or dizzy. You have a headache. You have muscle cramps. Get help right away if: You have chest pain. You feel very weak or you pass out (faint). You have bloody or black poop or poop that looks like tar. You have very bad pain, cramping, or bloating in your belly (abdomen). You have trouble breathing or you are breathing very quickly. Your heart is beating very quickly. Your skin feels cold and clammy. You feel confused. You have signs of losing too much water in your body, such as: Dark pee, very little pee, or no pee. Cracked lips. Dry mouth. Sunken eyes. Sleepiness. Weakness. Summary Diarrhea is when you pass loose and watery poop (stool) often. Diarrhea can make you feel weak and cause you to lose water in your body (get dehydrated). Take an ORS (oral rehydration solution). This is a drink that is sold at pharmacies and stores. Eat bland, easy-to-digest foods in small amounts as you are able. Contact a doctor if your condition gets worse. Get help right away if you have signs that you have lost too much water in your body. This information is not intended to replace advice given to you by your health care provider. Make sure you discuss any questions you have with your health care provider. Document Revised: 12/23/2017 Document Reviewed: 12/23/2017 Elsevier Patient Education  2022 Elsevier Inc.  

## 2021-04-02 NOTE — Progress Notes (Signed)
This visit occurred during the SARS-CoV-2 public health emergency.  Safety protocols were in place, including screening questions prior to the visit, additional usage of staff PPE, and extensive cleaning of exam room while observing appropriate contact time as indicated for disinfecting solutions.    Daisy Bennett , February 14, 1956, 65 y.o., female MRN: [id number] Patient Care Team    Relationship Specialty Notifications Start End  Ma Hillock, DO PCP - General Family Medicine  03/10/15   Josue Hector, MD PCP - Cardiology Cardiology Admissions 05/31/18   [place], Jennefer Bravo, MD Referring Physician Dermatology  02/10/16   Kathrynn Ducking, MD Consulting Physician Neurology  05/18/16   Linda Hedges, [place] Physician Obstetrics and Gynecology  06/04/16   Anell Barr, OD  Optometry  08/17/16     Chief Complaint  Patient presents with   Diarrhea    Pt c/o diarrhea x 4 mos but improved some with Align probiotic within the last month; denies mucous, blood; pt stated that diarrhea has started after dental procedure and was on an abx;      Subjective: Pt presents for an OV with complaints of diarrhea of at least 4 months in duration.  She felt the diarrhea started after taking an antibiotic for a tooth abscess.  She has started Probiotic over the last 6 weeks and has seen mild improvement in her diarrhea.  She denies any fever, chills, melena or abdominal pain.  She states she has had about 3 bowel movements a day.  First bowel movement will be loose, second bowel movement will have a little bit more fluid within in the third bowel movement of the day will be very watery.  She states the bowel movements are usually yellowish tinged and not dark or pale.  She does not feel she had any medication changes during this time.  She is mildly immunocompromised with her multiple sclerosis condition and treatment.  She does enjoy camping and [place]ing [place] during the summertime  months. she does deny fever, pain or night sweats.  However she has had quite significant unintentional weight loss of approximately 20 pounds in the last 6 months. She is prescribed metformin, however this dose has been unchanged for many years.  Depression screen [facility] 2/9 11/26/2020 07/09/2020 03/25/2020 07/04/2019 02/15/2018  Decreased Interest 0 0 0 0 0  Down, Depressed, Hopeless 0 0 0 0 0  PHQ - 2 Score 0 0 0 0 0  Altered sleeping - - - - -  Tired, decreased energy - - - - -  Change in appetite - - - - -  Feeling bad or failure about yourself  - - - - -  Trouble concentrating - - - - -  Moving slowly or fidgety/restless - - - - -  Suicidal thoughts - - - - -  PHQ-9 Score - - - - -  Some recent data might be hidden    Allergies  Allergen Reactions   Betaseron [Interferon Beta-1b] Other (See Comments)    Increased LFT's   Provigil [Modafinil] Swelling    Tongue swelling and sores   Topamax [Topiramate]     Cognitive slowing   Doxycycline Other (See Comments)    Thrush,dizziness   Social History   Social History Narrative   The patient lives with husband. 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    Patient drinks occasionally, does not chew tobacco, does not use recreational drugs. She does  drink occasional caffeine. She does wear her seatbelt. She does wear bike helmet riding a bike. She does exercise 3 times a week. She does not wear hearing aids or dentures. There is a smoke alarm in her home. There are no firearms in her home. She feels safe in her relationship. She has never experienced physical abuse.   Patient sleeps 7-8 hours a night.   Patient drinks 2 cups of caffeine daily.   Patient is right handed.   Past Medical History:  Diagnosis Date   Basal cell carcinoma    Brain bleed ([place]) 2017   COVID-19 08/2019   DM (diabetes mellitus) ([place])    Former smoker    Glossitis, benign migratory 08/31/2019   Goiter, unspecified    History of colonic  polyps 2012   Multiple sclerosis (HCC)    Non-ST elevated myocardial infarction (non-STEMI) (HCC)    Olecranon bursitis of left elbow 10/17/2017   Oral mucositis 08/31/2019   Oral mucositis 08/31/2019   SCCA (squamous cell carcinoma) of skin    right shin   SEMI (subendocardial myocardial infarction) ([place]) 09/29/09   TOTAL RCA WITH STENT TO OM BRANCH   Shingles    Squamous cell carcinoma 2019   lower ext   Traumatic rupture of volar plate of left ring finger 03/25/2020   Unspecified essential hypertension    Unspecified hypothyroidism    Past Surgical History:  Procedure Laterality Date   CATARACT SURGERY  12/10   CESAREAN SECTION     CORONARY ANGIOPLASTY WITH STENT PLACEMENT  2011   KNEE SURGERY     leg surgery     squamous ccell removed    TONSILLECTOMY     VESICOVAGINAL FISTULA CLOSURE W/ TAH     Family History  Problem Relation Age of Onset   Hypertension Mother    Heart failure Father    Diabetes Father    Aneurysm Brother        THORACIC/ABD ANEURYSM   Prostate cancer Brother 38   Diabetes Brother    Fibromyalgia Sister    Brain cancer Maternal Grandmother 52   Multiple sclerosis Neg Hx    Colon cancer Neg Hx    Esophageal cancer Neg Hx    Allergies as of 04/02/2021       Reactions   Betaseron [interferon Beta-1b] Other (See Comments)   Increased LFT's   Provigil [modafinil] Swelling   Tongue swelling and sores   Topamax [topiramate]    Cognitive slowing   Doxycycline Other (See Comments)   Thrush,dizziness        Medication List        Accurate as of April 02, 2021  9:11 AM. If you have any questions, ask your nurse or doctor.          amLODipine 10 MG tablet Commonly known as: NORVASC TAKE 1 TABLET BY MOUTH EVERY DAY   aspirin EC 81 MG tablet Take 1 tablet (81 mg total) daily by mouth.   Aubagio 14 MG Tabs Generic drug: Teriflunomide Take by mouth.   B COMPLEX-B12 PO Take by mouth.   carvedilol 12.5 MG tablet Commonly known as:  COREG Take 1 tablet (12.5 mg total) by mouth 2 (two) times daily.   cholecalciferol 1000 units tablet Commonly known as: VITAMIN D Take 1,000 Units by mouth daily.   fenofibrate 160 MG tablet Take 1 tablet (160 mg total) by mouth daily.   fish oil-omega-3 fatty acids 1000 MG capsule Take 1 g by mouth daily.  gabapentin 600 MG tablet Commonly known as: NEURONTIN TAKE 1 TABLET BY MOUTH THREE TIMES A DAY   glimepiride 4 MG tablet Commonly known as: AMARYL 1 tab BID   hydrochlorothiazide 50 MG tablet Commonly known as: HYDRODIURIL Take 1 tablet (50 mg total) by mouth daily.   Iron 325 (65 Fe) MG Tabs Take 325 mg by mouth daily.   levothyroxine 112 MCG tablet Commonly known as: SYNTHROID Take 1 tablet (112 mcg total) by mouth daily.   losartan 100 MG tablet Commonly known as: COZAAR TAKE 1 TABLET BY MOUTH DAILY. PLEASE KEEP UPCOMING APPOINTMENT IN APRIL 2022 FOR FUTURE REFILLS. THANK YOU   metFORMIN 1000 MG tablet Commonly known as: GLUCOPHAGE Take 1 tablet (1,000 mg total) by mouth daily with breakfast. What changed: when to take this   mirtazapine 15 MG tablet Commonly known as: Remeron Take 1 tablet (15 mg total) by mouth at bedtime.   nitroGLYCERIN 0.4 MG SL tablet Commonly known as: Nitrostat Place 1 tablet (0.4 mg total) under the tongue every 5 (five) minutes x 3 doses as needed for chest pain.   pantoprazole 20 MG tablet Commonly known as: PROTONIX Take 1 tablet (20 mg total) by mouth daily.   potassium chloride 10 MEQ tablet Commonly known as: KLOR-CON Take 1 tablet (10 mEq total) by mouth daily.   prasugrel 10 MG Tabs tablet Commonly known as: EFFIENT Take 1 tablet (10 mg total) by mouth daily.   rosuvastatin 10 MG tablet Commonly known as: CRESTOR Take 1 tablet (10 mg total) by mouth daily.   venlafaxine XR 75 MG 24 hr capsule Commonly known as: Effexor XR Take 3 capsules (225 mg total) by mouth daily with breakfast.        All past  medical history, surgical history, allergies, family history, immunizations andmedications were updated in the EMR today and reviewed under the history and medication portions of their EMR.     ROS: Negative, with the exception of above mentioned in HPI   Objective:  BP 118/70   Pulse 65   Temp 98 F (36.7 C) (Oral)   Ht '5\' 6"'$  (1.676 m)   Wt 175 lb (79.4 kg)   SpO2 99%   BMI 28.25 kg/m  Body mass index is 28.25 kg/m. Gen: Afebrile. No acute distress. Nontoxic in appearance, well developed, well nourished.  HENT: AT. [place].  No cough.  No hoarseness.   Eyes:Pupils Equal Round Reactive to light, Extraocular movements intact,  Conjunctiva without redness, discharge or icterus. Neck/lymp/endocrine: Supple, no lymphadenopathy, no thyromegaly Abd: Soft.  Flat. NTND. BS present.  No masses palpated. No rebound or guarding.  Skin: No  rashes, purpura or petechiae.  Neuro:  Normal gait. PERLA. EOMi. Alert. Oriented x3  Psych: Normal affect, dress and demeanor. Normal speech. Normal thought content and judgment.  No results found. No results found. No results found for this or any previous visit (from the past 24 hour(s)).  Assessment/Plan: SHAHNAZ KLASS is a 65 y.o. female present for OV for  Need for influenza vaccination - Flu Vaccine QUAD High Dose(Fluad)  Diarrhea of presumed infectious origin/history of colon polyps with 3-year recall due next year/unintentional weight loss Is a regular significant bowel habit change for patient.  She is typically more on the constipation end of bowel movements.  Reassuring her white count and LFTs were normal on recent check this month. Labs collected today to rule out electrolyte imbalance, kidney dysfunction, inflammatory causes and infectious causes. Obvious concern for possible  C. difficile, however WBCs are in normal range therefore would not be a severe case.  She has mildly elevated acidophil's, question parasitic infection.  Giardia is a  possibility given her camping and canoeing summertime habits. -She has an appointment with GI next month  Continue align probiotic - Basic Metabolic Panel (BMET) - C-reactive protein - Pancreatic Elastase, Fecal; Future - Ova and parasite examination; Future - Gastrointestinal Pathogen Panel PCR; Future - Fecal leukocytes; Future - Clostridium Difficile by PCR; Future Follow-up and plan dependent upon laboratory results  Reviewed expectations re: course of current medical issues. Discussed self-management of symptoms. Outlined signs and symptoms indicating need for more acute intervention. Patient verbalized understanding and all questions were answered. Patient received an After-Visit Summary.    Orders Placed This Encounter  Procedures   Flu Vaccine QUAD High Dose(Fluad)   No orders of the defined types were placed in this encounter.  Referral Orders  No referral(s) requested today     Note is dictated utilizing voice recognition software. Although note has been proof read prior to signing, occasional typographical errors still can be missed. If any questions arise, please do not hesitate to call for verification.   electronically signed by:  Howard Pouch, DO  [place]

## 2021-04-07 NOTE — Addendum Note (Signed)
Addended by: Octaviano Glow on: 04/07/2021 11:49 AM   Modules accepted: Orders

## 2021-04-08 LAB — CLOSTRIDIUM DIFFICILE BY PCR: Toxigenic C. Difficile by PCR: NEGATIVE

## 2021-04-12 LAB — PANCREATIC ELASTASE, FECAL: Pancreatic Elastase-1, Stool: 77 mcg/g — ABNORMAL LOW

## 2021-04-12 LAB — OVA AND PARASITE EXAMINATION
CONCENTRATE RESULT:: NONE SEEN
MICRO NUMBER:: 12338030
SPECIMEN QUALITY:: ADEQUATE
TRICHROME RESULT:: NONE SEEN

## 2021-04-12 LAB — GASTROINTESTINAL PATHOGEN PANEL PCR
C. difficile Tox A/B, PCR: NOT DETECTED
Campylobacter, PCR: NOT DETECTED
Cryptosporidium, PCR: NOT DETECTED
E coli (ETEC) LT/ST PCR: NOT DETECTED
E coli (STEC) stx1/stx2, PCR: NOT DETECTED
E coli 0157, PCR: NOT DETECTED
Giardia lamblia, PCR: NOT DETECTED
Norovirus, PCR: NOT DETECTED
Rotavirus A, PCR: NOT DETECTED
Salmonella, PCR: NOT DETECTED
Shigella, PCR: NOT DETECTED

## 2021-04-12 LAB — FECAL LEUKOCYTES
Micro Number: 12338029
Result: NOT DETECTED
Specimen Quality: ADEQUATE

## 2021-04-13 ENCOUNTER — Ambulatory Visit (INDEPENDENT_AMBULATORY_CARE_PROVIDER_SITE_OTHER): Payer: Medicare Other | Admitting: Neurology

## 2021-04-13 ENCOUNTER — Other Ambulatory Visit: Payer: Self-pay

## 2021-04-13 ENCOUNTER — Telehealth: Payer: Self-pay | Admitting: Family Medicine

## 2021-04-13 ENCOUNTER — Encounter: Payer: Self-pay | Admitting: Neurology

## 2021-04-13 VITALS — BP 155/69 | HR 71 | Ht 66.5 in | Wt 175.6 lb

## 2021-04-13 DIAGNOSIS — I25119 Atherosclerotic heart disease of native coronary artery with unspecified angina pectoris: Secondary | ICD-10-CM

## 2021-04-13 DIAGNOSIS — G35 Multiple sclerosis: Secondary | ICD-10-CM | POA: Diagnosis not present

## 2021-04-13 MED ORDER — CHOLESTYRAMINE 4 G PO PACK: 4.0000 g | PACK | Freq: Two times a day (BID) | ORAL | 12 refills | Status: DC

## 2021-04-13 NOTE — Telephone Encounter (Signed)
Please inform patient the following information: Her stool studies ruled out infectious causes for her diarrhea. She did have results consistent with pancreatic insufficiency.  Her diarrhea could be from a dysregulation of her pancreatic enzymes.  Her gastroenterologist would pursue this and rule out other more worrisome causes for her.  -She has upcoming appointment with them in a couple weeks.  I have called in a medicine called Questran which it is a packet of powder that is mixed in fluid and is taken before 2 meals of the day such as lunch and dinner.  This may help slow down the diarrhea, at least until she sees her gastroenterologist.  It is very important to remember to take all medications at least 1 hour before using the powder and/or 4-6 hours after using the powder.  Otherwise other medicines will not absorb appropriately.  -Please forward results to her gastroenterology team. Thanks.

## 2021-04-13 NOTE — Progress Notes (Signed)
Reason for visit: Multiple sclerosis  Daisy Bennett is an 65 y.o. female  History of present illness:  Daisy Bennett is a 65 year old right-handed white female with a history of multiple sclerosis and diabetes.  The patient apparently was diagnosed with multiple sclerosis at [facility] in the 1990s, was placed on Betaseron.  She was seen by Dr. Janann Colonel through our office in 2014 and had been on Tecfidera at that time until just recently.  She was noted to have low absolute lymphocyte counts and was switched over to Aubagio within the last 4 months or so but developed significant diarrhea but never realized it was the Aubagio causing it.  She is on metformin but this medication predates the onset of her diarrhea.  The patient has had a 20 pound weight loss over the last 4 months.  Over the last month she has noted some occasional electric shock sensations into the left lower face that is new for her.  She has not noted any new motor or sensory symptoms or any visual symptoms.  She claims that her last identifiable MS attack was in the 1990s associated with left leg numbness.  She does have some sciatica pain in the left leg.  She comes to this office for an evaluation.  Past Medical History:  Diagnosis Date   Basal cell carcinoma    Brain bleed ([place]) 2017   COVID-19 08/2019   DM (diabetes mellitus) ([place])    Former smoker    Glossitis, benign migratory 08/31/2019   Goiter, unspecified    History of colonic polyps 2012   Multiple sclerosis (HCC)    Non-ST elevated myocardial infarction (non-STEMI) (HCC)    Olecranon bursitis of left elbow 10/17/2017   Oral mucositis 08/31/2019   Oral mucositis 08/31/2019   SCCA (squamous cell carcinoma) of skin    right shin   SEMI (subendocardial myocardial infarction) ([place]) 09/29/09   TOTAL RCA WITH STENT TO OM BRANCH   Shingles    Squamous cell carcinoma 2019   lower ext   Traumatic rupture of volar plate of left ring finger 03/25/2020   Unspecified  essential hypertension    Unspecified hypothyroidism     Past Surgical History:  Procedure Laterality Date   CATARACT SURGERY  12/10   CESAREAN SECTION     CORONARY ANGIOPLASTY WITH STENT PLACEMENT  2011   KNEE SURGERY     leg surgery     squamous ccell removed    TONSILLECTOMY     VESICOVAGINAL FISTULA CLOSURE W/ TAH      Family History  Problem Relation Age of Onset   Hypertension Mother    Heart failure Father    Diabetes Father    Aneurysm Brother        THORACIC/ABD ANEURYSM   Prostate cancer Brother 20   Diabetes Brother    Fibromyalgia Sister    Brain cancer Maternal Grandmother 68   Multiple sclerosis Neg Hx    Colon cancer Neg Hx    Esophageal cancer Neg Hx     Social history:  reports that she quit smoking about 5 years ago. Her smoking use included cigarettes. She has a 15.00 pack-year smoking history. She has never used smokeless tobacco. She reports current alcohol use. She reports that she does not use drugs.    Allergies  Allergen Reactions   Betaseron [Interferon Beta-1b] Other (See Comments)    Increased LFT's   Provigil [Modafinil] Swelling    Tongue swelling and sores  Topamax [Topiramate]     Cognitive slowing   Doxycycline Other (See Comments)    Thrush,dizziness    Medications:  Prior to Admission medications   Medication Sig Start Date End Date Taking? Authorizing Provider  amLODipine (NORVASC) 10 MG tablet TAKE 1 TABLET BY MOUTH EVERY DAY 01/21/21  Yes Josue Hector, MD  aspirin EC 81 MG tablet Take 1 tablet (81 mg total) daily by mouth. 06/09/17  Yes Josue Hector, MD  B Complex Vitamins (B COMPLEX-B12 PO) Take by mouth.   Yes [provider]  carvedilol (COREG) 12.5 MG tablet Take 1 tablet (12.5 mg total) by mouth 2 (two) times daily. 03/03/21  Yes Kuneff, Renee A, DO  cholecalciferol (VITAMIN D) 1000 units tablet Take 1,000 Units by mouth daily.   Yes [provider]  fenofibrate 160 MG tablet Take 1 tablet (160 mg  total) by mouth daily. 03/03/21  Yes Kuneff, Renee A, DO  Ferrous Sulfate (IRON) 325 (65 FE) MG TABS Take 325 mg by mouth daily.   Yes [provider]  fish oil-omega-3 fatty acids 1000 MG capsule Take 1 g by mouth daily.   Yes [provider]  gabapentin (NEURONTIN) 600 MG tablet TAKE 1 TABLET BY MOUTH THREE TIMES A DAY 03/31/21  Yes Kathrynn Ducking, MD  glimepiride (AMARYL) 4 MG tablet 1 tab BID 03/03/21  Yes Kuneff, Renee A, DO  hydrochlorothiazide (HYDRODIURIL) 50 MG tablet Take 1 tablet (50 mg total) by mouth daily. 03/03/21  Yes Kuneff, Renee A, DO  levothyroxine (SYNTHROID) 112 MCG tablet Take 1 tablet (112 mcg total) by mouth daily. 03/03/21  Yes Kuneff, Renee A, DO  losartan (COZAAR) 100 MG tablet TAKE 1 TABLET BY MOUTH DAILY. PLEASE KEEP UPCOMING APPOINTMENT IN APRIL 2022 FOR FUTURE REFILLS. [place] YOU 02/23/21  Yes Josue Hector, MD  metFORMIN (GLUCOPHAGE) 1000 MG tablet Take 1 tablet (1,000 mg total) by mouth daily with breakfast. Patient taking differently: Take 1,000 mg by mouth at bedtime. 03/03/21  Yes Kuneff, Renee A, DO  mirtazapine (REMERON) 15 MG tablet Take 1 tablet (15 mg total) by mouth at bedtime. 03/03/21  Yes Kuneff, Renee A, DO  nitroGLYCERIN (NITROSTAT) 0.4 MG SL tablet Place 1 tablet (0.4 mg total) under the tongue every 5 (five) minutes x 3 doses as needed for chest pain. 11/17/20 07/19/22 Yes Josue Hector, MD  pantoprazole (PROTONIX) 20 MG tablet Take 1 tablet (20 mg total) by mouth daily. 03/03/21  Yes Kuneff, Renee A, DO  potassium chloride (KLOR-CON) 10 MEQ tablet Take 1 tablet (10 mEq total) by mouth daily. 03/03/21  Yes Kuneff, Renee A, DO  prasugrel (EFFIENT) 10 MG TABS tablet Take 1 tablet (10 mg total) by mouth daily. 12/09/20  Yes Josue Hector, MD  rosuvastatin (CRESTOR) 10 MG tablet Take 1 tablet (10 mg total) by mouth daily. 12/09/20  Yes Josue Hector, MD  Teriflunomide (AUBAGIO) 14 MG TABS Take by mouth.   Yes [provider]  venlafaxine  XR (EFFEXOR XR) 75 MG 24 hr capsule Take 3 capsules (225 mg total) by mouth daily with breakfast. 03/03/21  Yes Kuneff, Renee A, DO    ROS:  Out of a complete 14 system review of symptoms, the patient complains only of the following symptoms, and all other reviewed systems are negative.  Left face pain Diarrhea Weight loss  Blood pressure (!) 155/69, pulse 71, height 5' 6.5" (1.689 m), weight 175 lb 9.6 oz (79.7 kg).  Physical Exam  General: The patient is alert and cooperative at the time of the examination.  The patient is mildly obese.  Skin: No significant peripheral edema is noted.   Neurologic Exam  Mental status: The patient is alert and oriented x 3 at the time of the examination. The patient has apparent normal recent and remote memory, with an apparently normal attention span and concentration ability.   Cranial nerves: Facial symmetry is present. Speech is normal, no aphasia or dysarthria is noted. Extraocular movements are full. Visual fields are full.  Pupils are equal, round, and reactive to light.  Discs are flat bilaterally.  Motor: The patient has good strength in all 4 extremities.  Sensory examination: Soft touch sensation is symmetric on the face, arms, and legs.  Coordination: The patient has good finger-nose-finger and heel-to-shin bilaterally.  Gait and station: The patient has a normal gait. Tandem gait is very slightly unsteady.  Romberg is negative. No drift is seen.  Reflexes: Deep tendon reflexes are symmetric.   MRI brain 10/13/20:  IMPRESSION: This MRI of the brain with and without contrast shows the following: 1.   Multiple T2/FLAIR hyperintense foci, predominantly in the periventricular and juxtacortical white matter of the hemispheres.  This is consistent with chronic demyelinating plaque associated with multiple sclerosis.  None of the foci appear to be acute.  They do not enhance.  Compared to the MRI from 02/20/2019, there are no new  lesions. 2.   No acute findings.  Normal enhancement pattern.  * MRI scan images were reviewed online. I agree with the written report.    Assessment/Plan:  1.  Multiple sclerosis  2.  Diarrhea on Aubagio  The patient will stop the Aubagio at this point.  The patient clinically has been very stable for quite a number of years, I do not think that ongoing medical therapy is indicated at this time.  The patient will be followed conservatively off of disease modifying agents.  She will follow-up here in 6 months.  We may consider rechecking MRI of the brain in 1 year.  She is developing some shock sensations in the left lower face that could represent trigeminal neuralgia.  This will need to be followed.  She will contact our office if new issues arise.  In the future, she can be followed through Dr. Felecia Shelling.  Jill Alexanders MD 04/13/2021 12:05 PM  [facility] [address] [place], [place] [address]  Phone [phone] Fax [phone]

## 2021-04-13 NOTE — Telephone Encounter (Signed)
Yes I would encourage her to keep her GI appointment, mostly for the unintentional weight loss.  She had enough weight loss that has me concerned and we certainly want to miss anything.  She would not need to take the new prescription I called in today called Questran, as long as her diarrhea improves over the next few days after stopping the the neurological med.  She may find she could use the Questran if the diarrhea does not resolve quickly.

## 2021-04-13 NOTE — Telephone Encounter (Signed)
Spoke with patient regarding results/recommendations.  

## 2021-04-13 NOTE — Telephone Encounter (Signed)
Pt found out today that some medication that the neurologist gave her is probably causing. She saw neurology today and they took her off the medication and told her it should help stop her GI issue. She wanted to know if it would still be necessary to take the new Rx and if she really needed to keep the GI appt.

## 2021-04-17 ENCOUNTER — Ambulatory Visit (INDEPENDENT_AMBULATORY_CARE_PROVIDER_SITE_OTHER): Payer: Medicare Other

## 2021-04-17 ENCOUNTER — Telehealth: Payer: Self-pay | Admitting: Family Medicine

## 2021-04-17 ENCOUNTER — Encounter: Payer: Self-pay | Admitting: Family Medicine

## 2021-04-17 ENCOUNTER — Telehealth (INDEPENDENT_AMBULATORY_CARE_PROVIDER_SITE_OTHER): Payer: Medicare Other | Admitting: Family Medicine

## 2021-04-17 ENCOUNTER — Other Ambulatory Visit: Payer: Self-pay

## 2021-04-17 ENCOUNTER — Telehealth: Payer: Medicare Other | Admitting: Family Medicine

## 2021-04-17 ENCOUNTER — Ambulatory Visit (HOSPITAL_BASED_OUTPATIENT_CLINIC_OR_DEPARTMENT_OTHER)
Admission: RE | Admit: 2021-04-17 | Discharge: 2021-04-17 | Disposition: A | Discharge status: Home / Self Care | Payer: Medicare Other | Source: Ambulatory Visit | Attending: Family Medicine | Admitting: Family Medicine

## 2021-04-17 VITALS — Temp 101.5°F

## 2021-04-17 DIAGNOSIS — R519 Headache, unspecified: Secondary | ICD-10-CM

## 2021-04-17 DIAGNOSIS — R509 Fever, unspecified: Secondary | ICD-10-CM | POA: Insufficient documentation

## 2021-04-17 DIAGNOSIS — R0789 Other chest pain: Secondary | ICD-10-CM

## 2021-04-17 DIAGNOSIS — M791 Myalgia, unspecified site: Secondary | ICD-10-CM

## 2021-04-17 LAB — POCT INFLUENZA A/B
Influenza A, POC: NEGATIVE
Influenza B, POC: NEGATIVE

## 2021-04-17 LAB — POCT RAPID STREP A (OFFICE): Rapid Strep A Screen: NEGATIVE

## 2021-04-17 MED ORDER — CEFDINIR 300 MG PO CAPS: 300.0000 mg | ORAL_CAPSULE | Freq: Two times a day (BID) | ORAL | 0 refills | Status: DC

## 2021-04-17 NOTE — Telephone Encounter (Signed)
Spoke with pt regarding labs and instructions.   

## 2021-04-17 NOTE — Telephone Encounter (Signed)
Please inform patient the following information: Her influenza and strep test were both negative. Chest x-ray did not show evidence of pneumonia. Her home COVID tests had also been reported as negative. Given that symptoms have lasted over about a week and are not improving, I would start treatment with antibiotic to cover sinus/bronchitis potential causes.  This was likely initially started by a viral infection, and secondary infection arise.   Rest, hydrate. Start Omnicef antibiotic that is every 12 hours for 7 days. If symptoms or not improving, I would encourage her to also call her neurologist to ensure this is not a reaction to stopping the multiple sclerosis medication. She is not seeing an improvement in her symptoms over the weekend, I would encourage her to call so we can see her in person early next week.  Symptoms are severely worsening over the weekend please be seen emergently.

## 2021-04-17 NOTE — Progress Notes (Signed)
VIRTUAL VISIT VIA VIDEO  I connected with Daisy Bennett on 04/17/21 at 12:00 PM EDT by elemedicine application and verified that I am speaking with the correct person using two identifiers. Location patient: Home Location provider: [facility], Office Persons participating in the virtual visit: Patient, Dr. Raoul Pitch and Darnell Level. Cesar, CMA  I discussed the limitations of evaluation and management by telemedicine and the availability of in person appointments. The patient expressed understanding and agreed to proceed.   SUBJECTIVE Chief Complaint  Patient presents with   Fever    X 4 days, highest temp was 102.5. she has completed 2 at home covid tests this week; negative results   Fatigue    X 6 days    HPI: Daisy Bennett is a 65 y.o. female present for acute illness.  She reports she has had feelings of fatigue for 6 days or more.  She also has noted a fever during this time but started taking her temperature approximately 4 days ago and T-max of 102.5 F daily.  She states she felt like she is coming down with the flu, had a mild headache and a backache (that was described as all over her back).  She denies any chills, cough, urinary symptoms, bowel habit changes, ongoing headache or shortness of breath.  Her multiple sclerosis medication was discontinued secondary to severe diarrhea and weight loss.  He denies any abdominal pain. She reports she is working around a lot of dust prior to onset of symptoms and figured she was having a infection secondary to breathing in the dusty air.  Denies sore throat or nasal congestion.  ROS: See pertinent positives and negatives per HPI.  Patient Active Problem List   Diagnosis Date Noted   Unintentional weight loss 04/02/2021   Chronic kidney disease, stage 3b ([place]) 03/03/2021   Mild neurocognitive disorder due to another medical condition [id number]   Diabetic complication ([place]) [id number]   Acquired thrombophilia ([place])- chronic anticoag  05/18/2016   Subdural hematoma ([place]) 05/18/2016   Morbid obesity ([place]) 05/12/2016   History of colon polyps 12/15/2015   Mixed hyperlipidemia 10/23/2009   Coronary atherosclerosis 10/23/2009   CARDIAC MURMUR 10/23/2009   Hypothyroidism 10/15/2009   Multiple sclerosis ([place]) 10/15/2009   Essential hypertension 10/15/2009    Social History   Tobacco Use   Smoking status: Former    Packs/day: 1.00    Years: 15.00    Pack years: 15.00    Types: Cigarettes    Quit date: 12/16/2015    Years since quitting: 5.3   Smokeless tobacco: Never   Tobacco comments:    on and off smoker  Substance Use Topics   Alcohol use: Yes    Alcohol/week: 0.0 standard drinks    Comment: OCCASIONAL ALCOHOL USE.    Current Outpatient Medications:    amLODipine (NORVASC) 10 MG tablet, TAKE 1 TABLET BY MOUTH EVERY DAY, Disp: 90 tablet, Rfl: 3   aspirin EC 81 MG tablet, Take 1 tablet (81 mg total) daily by mouth., Disp: , Rfl:    B Complex Vitamins (B COMPLEX-B12 PO), Take by mouth., Disp: , Rfl:    carvedilol (COREG) 12.5 MG tablet, Take 1 tablet (12.5 mg total) by mouth 2 (two) times daily., Disp: 180 tablet, Rfl: 1   cholecalciferol (VITAMIN D) 1000 units tablet, Take 1,000 Units by mouth daily., Disp: , Rfl:    cholestyramine (QUESTRAN) 4 g packet, Take 1 packet (4 g total) by mouth 2 (two) times daily. All  other daily medications need to be taken at least 1 hour before use of Questran or 4-6 hours after use of Questran., Disp: 60 each, Rfl: 12   fenofibrate 160 MG tablet, Take 1 tablet (160 mg total) by mouth daily., Disp: 90 tablet, Rfl: 3   Ferrous Sulfate (IRON) 325 (65 FE) MG TABS, Take 325 mg by mouth daily., Disp: , Rfl:    fish oil-omega-3 fatty acids 1000 MG capsule, Take 1 g by mouth daily., Disp: , Rfl:    gabapentin (NEURONTIN) 600 MG tablet, TAKE 1 TABLET BY MOUTH THREE TIMES A DAY, Disp: 270 tablet, Rfl: 0   glimepiride (AMARYL) 4 MG tablet, 1 tab BID, Disp: 180 tablet, Rfl: 1    hydrochlorothiazide (HYDRODIURIL) 50 MG tablet, Take 1 tablet (50 mg total) by mouth daily., Disp: 90 tablet, Rfl: 1   levothyroxine (SYNTHROID) 112 MCG tablet, Take 1 tablet (112 mcg total) by mouth daily., Disp: 90 tablet, Rfl: 3   losartan (COZAAR) 100 MG tablet, TAKE 1 TABLET BY MOUTH DAILY. PLEASE KEEP UPCOMING APPOINTMENT IN APRIL 2022 FOR FUTURE REFILLS. THANK YOU, Disp: 90 tablet, Rfl: 2   metFORMIN (GLUCOPHAGE) 1000 MG tablet, Take 1 tablet (1,000 mg total) by mouth daily with breakfast. (Patient taking differently: Take 1,000 mg by mouth at bedtime.), Disp: 90 tablet, Rfl: 1   mirtazapine (REMERON) 15 MG tablet, Take 1 tablet (15 mg total) by mouth at bedtime., Disp: 90 tablet, Rfl: 1   nitroGLYCERIN (NITROSTAT) 0.4 MG SL tablet, Place 1 tablet (0.4 mg total) under the tongue every 5 (five) minutes x 3 doses as needed for chest pain., Disp: 25 tablet, Rfl: 4   pantoprazole (PROTONIX) 20 MG tablet, Take 1 tablet (20 mg total) by mouth daily., Disp: 90 tablet, Rfl: 1   potassium chloride (KLOR-CON) 10 MEQ tablet, Take 1 tablet (10 mEq total) by mouth daily., Disp: 90 tablet, Rfl: 1   prasugrel (EFFIENT) 10 MG TABS tablet, Take 1 tablet (10 mg total) by mouth daily., Disp: 90 tablet, Rfl: 3   rosuvastatin (CRESTOR) 10 MG tablet, Take 1 tablet (10 mg total) by mouth daily., Disp: 90 tablet, Rfl: 3   venlafaxine XR (EFFEXOR XR) 75 MG 24 hr capsule, Take 3 capsules (225 mg total) by mouth daily with breakfast., Disp: 270 capsule, Rfl: 1   cefdinir (OMNICEF) 300 MG capsule, Take 1 capsule (300 mg total) by mouth 2 (two) times daily., Disp: 14 capsule, Rfl: 0  Allergies  Allergen Reactions   Betaseron [Interferon Beta-1b] Other (See Comments)    Increased LFT's   Provigil [Modafinil] Swelling    Tongue swelling and sores   Topamax [Topiramate]     Cognitive slowing   Doxycycline Other (See Comments)    Thrush,dizziness    OBJECTIVE: Temp (!) 101.5 F (38.6 C) (Temporal)  Gen: No acute  distress. Nontoxic in appearance.  Appears tired. HENT: AT. [place].  MMM.  Eyes:Pupils Equal Round Reactive to light, Extraocular movements intact,  Conjunctiva without redness, discharge or icterus. Chest: Cough not present, no shortness of breath Skin: No rashes, purpura or petechiae.  Neuro:  Alert. Oriented x3  Psych: Normal affect and demeanor. Normal speech. Normal thought content and judgment.  ASSESSMENT AND PLAN: Daisy Bennett is a 65 y.o. female present for  Acute intractable headache/myalgia/fever/chest discomfort - POCT Influenza A/B; Future - DG Chest 2 View; Future - POCT rapid strep A; Future Rapid influenza and rapid strep test are negative today.  Chest x-ray reported normal.  Uncertain origin of patient's ongoing fever and fatigue.   She has had loose stools for quite a few months secondary to the multiple sclerosis med.  She underwent recent stool studies only positive for fecal elastase-mild.  She has GI follow-up in a couple weeks.  No worsening symptoms. She had just stopped her multiple sclerosis medication.  Could be potential side effect from discontinuing med/MS flare. We discussed potential for viral illness that is not COVID or influenza causing initial symptoms that have progressed onto a bacterial infection.  Elected to start Omnicef to twice daily x7 days. Patient was encouraged if she is not seeing improvement with antibiotic, rest and hydration over the weekend she is to call in Monday so that we can work her into the schedule soon as possible to be seen for further evaluation. If symptoms acutely worsening over the weekend she was urged to be seen emergently for further evaluation. Patient agrees with plan   Howard Pouch, DO 04/17/2021   No follow-ups on file.  Orders Placed This Encounter  Procedures   DG Chest 2 View   POCT Influenza A/B   POCT rapid strep A   No orders of the defined types were placed in this encounter.  Referral Orders  No  referral(s) requested today

## 2021-04-27 ENCOUNTER — Telehealth: Payer: Self-pay

## 2021-04-27 NOTE — Telephone Encounter (Signed)
I called patient. She is no longer taking aubagio and her diarrhea has resolved. She will keep her follow up for March as scheduled. She will call us if she has questions or concerns in the interim.

## 2021-05-06 ENCOUNTER — Encounter: Payer: Self-pay | Admitting: Gastroenterology

## 2021-05-06 ENCOUNTER — Ambulatory Visit (INDEPENDENT_AMBULATORY_CARE_PROVIDER_SITE_OTHER): Payer: Medicare Other | Admitting: Gastroenterology

## 2021-05-06 VITALS — BP 118/62 | HR 76 | Ht 65.5 in | Wt 176.4 lb

## 2021-05-06 DIAGNOSIS — K529 Noninfective gastroenteritis and colitis, unspecified: Secondary | ICD-10-CM

## 2021-05-06 DIAGNOSIS — R634 Abnormal weight loss: Secondary | ICD-10-CM | POA: Diagnosis not present

## 2021-05-06 DIAGNOSIS — I25119 Atherosclerotic heart disease of native coronary artery with unspecified angina pectoris: Secondary | ICD-10-CM

## 2021-05-06 NOTE — Progress Notes (Addendum)
[facility] Progress Note  Chief Complaint: Chronic diarrhea  Subjective  History: I saw Daisy Bennett for an office visit in September 2020 for history of colon polyps.  She has medical issues as described below, last cardiology office visit with Dr. Johnsie Cancel April of this year including the following: "65 y.o. seen f/u CAD  post stenting 2011  She had a non ST elevation MI with subsequent stenting of an OM. She has diffuse 3VD with a total right that is collateralized. She had smoewhat atypical presentation with her DM. Marland KitchenLast myovue done 06/16/18 low risk non ischemic EF 68%".   At that visit, patient also noted to have recently had COVID and a head injury with concussive symptoms causing memory difficulty and mood changes. Last colonoscopy October 2020, 3 adenomatous polyps removed (largest about 16 mm), 3-year recall recommended. August PCP visit noting increase in hemoglobin A1c up to 8.6, unusual for her.  Amaryl dose/frequency increased, patient has been on a chronic stable dose of metformin once daily, at least as far back as her cardiology visit in April. PCP visit September 1st, Daisy Bennett described about 6 weeks of diarrhea with several loose to watery stools per day.  Patient felt that had started shortly after getting antibiotics for a dental abscess (stool studies done as noted below).  Patient was also noted to have lost about 20 pounds in the last 6 months  Daisy Bennett tells me that she started having the diarrhea sometime in the spring, and only recently realized it was around the same time that she atarted [place] for MS(stopped about 2 weeks ago after neurology visit).  She read the possible side effect of this medicine, it seemed the diarrhea was commonly reported, so she brought this to the attention of primary care and neurology.  Since stopping that medicine, her bowel habits have gradually returned to near normal in the last 2 weeks.  Her stool is now formed and occurring daily, where  previously she would have a BM every 2 to 3 days.  She denies rectal bleeding, has no abdominal pain, nausea, vomiting.  She was concerned by the abnormal pancreas enzyme test and wondered if her MS medication had caused some pancreatic problem, since she read the pancreatitis could also be a side effect.  When she was having diarrhea, it was typically 3-4 stools per day becoming progressively loose and then watery later in the day, they were never oily/greasy or floating.  ROS: Cardiovascular:  no chest pain Respiratory: no dyspnea MS symptoms with some neuropathy and intermittent weakness, she reports its been stable many years. Remainder of systems negative except as above The patient's Past Medical, Family and Social History were reviewed and are on file in the EMR. 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   Goiter, unspecified    History of colonic polyps 2012   Multiple sclerosis (HCC)    Non-ST elevated myocardial infarction (non-STEMI) (HCC)    Olecranon bursitis of left elbow 10/17/2017   Oral mucositis 08/31/2019   Oral mucositis 08/31/2019   SCCA (squamous cell carcinoma) of skin    right shin   SEMI (subendocardial myocardial infarction) ([place]) 09/29/09   TOTAL RCA WITH STENT TO OM BRANCH   Shingles    Squamous cell carcinoma 2019   lower ext  Traumatic rupture of volar plate of left ring finger 03/25/2020   Unspecified essential hypertension    Unspecified hypothyroidism    Past Surgical History:  Procedure Laterality Date   CATARACT SURGERY  12/10   CESAREAN SECTION     CORONARY ANGIOPLASTY WITH STENT PLACEMENT  2011   KNEE SURGERY     leg surgery     squamous ccell removed    TONSILLECTOMY     VESICOVAGINAL FISTULA CLOSURE W/ TAH      Objective:  Med list reviewed  Current Outpatient Medications:     amLODipine (NORVASC) 10 MG tablet, TAKE 1 TABLET BY MOUTH EVERY DAY, Disp: 90 tablet, Rfl: 3   aspirin EC 81 MG tablet, Take 1 tablet (81 mg total) daily by mouth., Disp: , Rfl:    B Complex Vitamins (B COMPLEX-B12 PO), Take by mouth., Disp: , Rfl:    carvedilol (COREG) 12.5 MG tablet, Take 1 tablet (12.5 mg total) by mouth 2 (two) times daily., Disp: 180 tablet, Rfl: 1   cholecalciferol (VITAMIN D) 1000 units tablet, Take 1,000 Units by mouth daily., Disp: , Rfl:    fenofibrate 160 MG tablet, Take 1 tablet (160 mg total) by mouth daily., Disp: 90 tablet, Rfl: 3   Ferrous Sulfate (IRON) 325 (65 FE) MG TABS, Take 325 mg by mouth daily., Disp: , Rfl:    fish oil-omega-3 fatty acids 1000 MG capsule, Take 1 g by mouth daily., Disp: , Rfl:    gabapentin (NEURONTIN) 600 MG tablet, TAKE 1 TABLET BY MOUTH THREE TIMES A DAY, Disp: 270 tablet, Rfl: 0   glimepiride (AMARYL) 4 MG tablet, 1 tab BID, Disp: 180 tablet, Rfl: 1   hydrochlorothiazide (HYDRODIURIL) 50 MG tablet, Take 1 tablet (50 mg total) by mouth daily., Disp: 90 tablet, Rfl: 1   levothyroxine (SYNTHROID) 112 MCG tablet, Take 1 tablet (112 mcg total) by mouth daily., Disp: 90 tablet, Rfl: 3   losartan (COZAAR) 100 MG tablet, TAKE 1 TABLET BY MOUTH DAILY. PLEASE KEEP UPCOMING APPOINTMENT IN APRIL 2022 FOR FUTURE REFILLS. THANK YOU, Disp: 90 tablet, Rfl: 2   metFORMIN (GLUCOPHAGE) 1000 MG tablet, Take 1 tablet (1,000 mg total) by mouth daily with breakfast. (Patient taking differently: Take 1,000 mg by mouth at bedtime.), Disp: 90 tablet, Rfl: 1   mirtazapine (REMERON) 15 MG tablet, Take 1 tablet (15 mg total) by mouth at bedtime., Disp: 90 tablet, Rfl: 1   pantoprazole (PROTONIX) 20 MG tablet, Take 1 tablet (20 mg total) by mouth daily., Disp: 90 tablet, Rfl: 1   potassium chloride (KLOR-CON) 10 MEQ tablet, Take 1 tablet (10 mEq total) by mouth daily., Disp: 90 tablet, Rfl: 1   prasugrel (EFFIENT) 10 MG TABS tablet, Take 1 tablet (10 mg total) by  mouth daily., Disp: 90 tablet, Rfl: 3   rosuvastatin (CRESTOR) 10 MG tablet, Take 1 tablet (10 mg total) by mouth daily., Disp: 90 tablet, Rfl: 3   venlafaxine XR (EFFEXOR XR) 75 MG 24 hr capsule, Take 3 capsules (225 mg total) by mouth daily with breakfast., Disp: 270 capsule, Rfl: 1   cholestyramine (QUESTRAN) 4 g packet, Take 1 packet (4 g total) by mouth 2 (two) times daily. All other daily medications need to be taken at least 1 hour before use of Questran or 4-6 hours after use of Questran. (Patient not taking: No sig reported), Disp: 60 each, Rfl: 12   nitroGLYCERIN (NITROSTAT) 0.4 MG SL tablet, Place 1 tablet (0.4 mg total) under the tongue every  5 (five) minutes x 3 doses as needed for chest pain. (Patient not taking: Reported on 05/06/2021), Disp: 25 tablet, Rfl: 4   Vital signs in last 24 hrs: Vitals:   05/06/21 0915  BP: 118/62  Pulse: 76   Wt Readings from Last 3 Encounters:  05/06/21 176 lb 6 oz (80 kg)  04/13/21 175 lb 9.6 oz (79.7 kg)  04/02/21 175 lb (79.4 kg)    Wt was reportedly down 20# last 6 months -stable in the last month (see above) Physical Exam  Pleasant and conversational, gait slow but steady, gets on exam table without assistance. HEENT: sclera anicteric, oral mucosa moist without lesions Neck: supple, no thyromegaly, JVD or lymphadenopathy Cardiac: RRR without murmurs, S1S2 heard, no peripheral edema Pulm: clear to auscultation bilaterally, normal RR and effort noted Abdomen: soft, no tenderness, with active bowel sounds. No guarding or palpable hepatosplenomegaly. Skin; warm and dry, no jaundice or rash  Labs:  Negative GI pathogen panel on September 6.  Neg O/P  Fecal elastase low at 77  CBC Latest Ref Rng & Units 03/23/2021 02/26/2021 02/09/2021  WBC 3.4 - 10.8 x10E3/uL 6.0 7.8 6.2  Hemoglobin 11.1 - 15.9 g/dL 11.9 11.3 12.3  Hematocrit 34.0 - 46.6 % 36.0 35.0 37.2  Platelets 150 - 450 x10E3/uL 138(L) 138(L) 131(L)   CMP Latest Ref Rng & Units  04/02/2021 03/23/2021 02/26/2021  Glucose 70 - 99 mg/dL 193(H) - -  BUN 6 - 23 mg/dL 21 - -  Creatinine 0.40 - 1.20 mg/dL 1.17 - -  Sodium 135 - 145 mEq/L 139 - -  Potassium 3.5 - 5.1 mEq/L 3.6 - -  Chloride 96 - 112 mEq/L 100 - -  CO2 19 - 32 mEq/L 27 - -  Calcium 8.4 - 10.5 mg/dL 9.9 - -  Total Protein 6.0 - 8.5 g/dL - 7.0 6.4  Total Bilirubin 0.0 - 1.2 mg/dL - 0.4 0.2  Alkaline Phos 44 - 121 IU/L - 58 69  AST 0 - 40 IU/L - 26 24  ALT 0 - 32 IU/L - 22 19    ___________________________________________ Radiologic studies: No abdominal imaging  ____________________________________________ Other:   _____________________________________________ Assessment & Plan  Assessment: Encounter Diagnoses  Name Primary?   Chronic diarrhea Yes   Weight loss    Many months of watery diarrhea that now seems likely to been related to her new MS medication. This probably cause decreased oral intake and weight loss. The markedly decreased fecal elastase often indicates exocrine pancreatic insufficiency, and the patient did have diarrhea and weight loss.  However, the character of her diarrhea was not typical for fat malabsorption, and I suspect that this stool test may be spuriously low.  If stool is watery enough, the dilutional effect will falsely lower the fecal elastase level. Again, considering how much better she has lately, it seems less likely that she has SIBO, celiac sprue or microscopic colitis.  I am still concerned that she had weight loss, that her diabetes has been more difficult to control lately, and that the elastase level was low.  Plan: CT abdomen and pelvis with oral and IV contrast to ensure no evidence of chronic pancreatitis changes or neoplasia.  Patient will portal message me in 3 to 4 weeks with an update.  If she has recurrence of diarrhea as had been occurring before, the next plan will be a trial of pancreatic enzyme supplements and scheduling an EGD and colonoscopy to  biopsy small bowel and colon. If so,  we would need to communicate with cardiology regarding her antiplatelet therapy.  43 minutes minutes were spent on this encounter (including extensive chart review, history/exam, counseling/coordination of care, and documentation) > 50% of that time was spent on counseling and coordination of care.   Nelida Meuse III

## 2021-05-06 NOTE — Patient Instructions (Signed)
If you are age 67 or older, your body mass index should be between 23-30. Your Body mass index is 28.9 kg/m. If this is out of the aforementioned range listed, please consider follow up with your Primary Care Provider.  If you are age 52 or younger, your body mass index should be between 19-25. Your Body mass index is 28.9 kg/m. If this is out of the aformentioned range listed, please consider follow up with your Primary Care Provider.   __________________________________________________________  The [facility] providers would like to encourage you to use [facility] to communicate with providers for non-urgent requests or questions.  Due to long hold times on the telephone, sending your provider a message by [facility] may be a faster and more efficient way to get a response.  Please allow 48 business hours for a response.  Please remember that this is for non-urgent requests.    You have been scheduled for a CT scan of the abdomen and pelvis at [facility], 1st floor Radiology. You are scheduled on 05-13-2021  at 2:30pm. You should arrive 15 minutes prior to your appointment time for registration.  Please pick up 2 bottles of contrast from [place] at least 3 days prior to your scan. The solution may taste better if refrigerated, but do NOT add ice or any other liquid to this solution. Shake well before drinking.   Please follow the written instructions below on the day of your exam:   1) Do not eat anything after 10:30am (4 hours prior to your test)   2) Drink 1 bottle of contrast @ 12:30pm (2 hours prior to your exam)  Remember to shake well before drinking and do NOT pour over ice.      3) Drink 1 bottle of contrast @ 1:30pm (1 hour prior to your exam)   You may take any medications as prescribed with a small amount of water, if necessary. If you take any of the following medications: METFORMIN, GLUCOPHAGE, GLUCOVANCE, AVANDAMET, RIOMET, FORTAMET, [place] MET, JANUMET, GLUMETZA or  METAGLIP, you MAY be asked to HOLD this medication 48 hours AFTER the exam.   The purpose of you drinking the oral contrast is to aid in the visualization of your intestinal tract. The contrast solution may cause some diarrhea. Depending on your individual set of symptoms, you may also receive an intravenous injection of x-ray contrast/dye. Plan on being at [facility] for 45 minutes or longer, depending on the type of exam you are having performed.   If you have any questions regarding your exam or if you need to reschedule, you may call [facility] at [phone] between the hours of 8:00 am and 5:00 pm, Monday-Friday.   It was a pleasure to see you today!  Thank you for trusting me with your gastrointestinal care!

## 2021-05-13 ENCOUNTER — Other Ambulatory Visit: Payer: Self-pay

## 2021-05-13 ENCOUNTER — Ambulatory Visit (HOSPITAL_COMMUNITY)
Admission: RE | Admit: 2021-05-13 | Discharge: 2021-05-13 | Disposition: A | Discharge status: Home / Self Care | Payer: Medicare Other | Source: Ambulatory Visit | Attending: Gastroenterology | Admitting: Gastroenterology

## 2021-05-13 DIAGNOSIS — K529 Noninfective gastroenteritis and colitis, unspecified: Secondary | ICD-10-CM | POA: Insufficient documentation

## 2021-05-13 DIAGNOSIS — R634 Abnormal weight loss: Secondary | ICD-10-CM | POA: Diagnosis not present

## 2021-05-13 LAB — POCT I-STAT CREATININE: Creatinine, Ser: 1.2 mg/dL — ABNORMAL HIGH (ref 0.44–1.00)

## 2021-05-13 MED ORDER — IOHEXOL 350 MG/ML SOLN
80.0000 mL | Freq: Once | INTRAVENOUS | Status: AC | PRN
  Administered 2021-05-13: 80 mL via INTRAVENOUS

## 2021-05-13 NOTE — Telephone Encounter (Signed)
Pt did not have BUN/creatinine prior to CT scan. I-stat order in epic. Pt notified via my chart.

## 2021-05-24 NOTE — Telephone Encounter (Signed)
Please see attached message stream re: patient's recent visit and ongoing diarrhea.  I am recommending a colonoscopy to see if she might have microscopic colitis. At the recent office visit, I had though perhaps an EGD and colonoscopy would be needed, but only because she was losing weight and wondering about possible small bowel cause. If her weight has stabilized, then a colonoscopy will suffice.  She is on Effient for CAD and sees Dr. Johnsie Cancel from Cardiology, so will need opinion on a 5 day pre-procedure hold of that medicine.  I have copied this to Dr. Johnsie Cancel for that reason.  Please contact patient and make arrangements if she is agreeable.  (Diabetic - usual oral med instructions.  Light breakfast day prior to procedure, then clears per usual.  AM scope slot.  Golytely prep - 2 liters in evening, one liter in AM, leaving a liter leftover)  - H. Danis  _________________________  Dr. Johnsie Cancel,    Please see my recent office note and this message re: proposed 5 day Effient hold before a planned colonoscopy.   Patient's cardiac condition sounds stable last time you saw her and when I saw her as well.  Thanks  - Wilfrid Lund

## 2021-05-25 NOTE — Telephone Encounter (Signed)
[place] or [employer], television/film set,    Please see attached message stream about scheduling colonoscopy for this patient. Dr. Johnsie Cancel of Cardiology sent a message back that we can hold effient 5 days prior. Please contact patient for scheduling and instructions if she is agreeable to colonoscopy.  - HD

## 2021-05-27 ENCOUNTER — Other Ambulatory Visit: Payer: Self-pay

## 2021-05-27 ENCOUNTER — Ambulatory Visit (INDEPENDENT_AMBULATORY_CARE_PROVIDER_SITE_OTHER): Payer: Medicare Other | Admitting: Family Medicine

## 2021-05-27 ENCOUNTER — Encounter: Payer: Self-pay | Admitting: Family Medicine

## 2021-05-27 VITALS — BP 130/75 | HR 68 | Temp 98.3°F | Wt 177.0 lb

## 2021-05-27 DIAGNOSIS — R300 Dysuria: Secondary | ICD-10-CM

## 2021-05-27 DIAGNOSIS — R81 Glycosuria: Secondary | ICD-10-CM

## 2021-05-27 LAB — POCT URINALYSIS DIPSTICK
Bilirubin, UA: NEGATIVE
Blood, UA: NEGATIVE
Glucose, UA: POSITIVE — AB
Ketones, UA: NEGATIVE
Leukocytes, UA: NEGATIVE
Nitrite, UA: NEGATIVE
Protein, UA: POSITIVE — AB
Spec Grav, UA: 1.01 (ref 1.010–1.025)
Urobilinogen, UA: NEGATIVE E.U./dL — AB
pH, UA: 6.5 (ref 5.0–8.0)

## 2021-05-27 MED ORDER — FLUCONAZOLE 150 MG PO TABS: 150.0000 mg | ORAL_TABLET | Freq: Once | ORAL | 0 refills | Status: AC

## 2021-05-27 MED ORDER — CEFDINIR 300 MG PO CAPS: 300.0000 mg | ORAL_CAPSULE | Freq: Two times a day (BID) | ORAL | 0 refills | Status: DC

## 2021-05-27 NOTE — Patient Instructions (Addendum)
  Great to meet you today.  I have refilled the medication(s) we provide.   If labs were collected, we will inform you of lab results once received either by echart message or telephone call.   - echart message- for normal results that have been seen by the patient already.   - telephone call: abnormal results or if patient has not viewed results in their echart.  Hydrate!!!!! Start omnicef every 12 hrs for 5 days.

## 2021-05-27 NOTE — Progress Notes (Signed)
This visit occurred during the SARS-CoV-2 public health emergency.  Safety protocols were in place, including screening questions prior to the visit, additional usage of staff PPE, and extensive cleaning of exam room while observing appropriate contact time as indicated for disinfecting solutions.    Daisy Bennett , [birth year], 65 y.o., female MRN: [id number] Patient Care Team    Relationship Specialty Notifications Start End  Ma Hillock, DO PCP - General Family Medicine  03/10/15   Josue Hector, MD PCP - Cardiology Cardiology Admissions 05/31/18   [place], Jennefer Bravo, MD Referring Physician Dermatology  02/10/16   Kathrynn Ducking, MD Consulting Physician Neurology  05/18/16   Linda Hedges, [place] Physician Obstetrics and Gynecology  06/04/16   Anell Barr, OD  Optometry  08/17/16     Chief Complaint  Patient presents with   Dysuria    Pt c/o dysuria x 4 days      Subjective: Pt presents for an OV with complaints of dysuria and urinary frequency of 4 days duration.  She denies any fever, chills, nausea or vomit.  She denies any low back pain.  She reports irritated/pressure feeling of her bladder.  She denies any hematuria.  She is a diabetic.  She reports her glucose in the morning seems to be between 170/180.  She does have an upcoming appointment for her diabetes.  Depression screen [facility] 2/9 05/27/2021 11/26/2020 07/09/2020 03/25/2020 07/04/2019  Decreased Interest 0 0 0 0 0  Down, Depressed, Hopeless 0 0 0 0 0  PHQ - 2 Score 0 0 0 0 0  Altered sleeping - - - - -  Tired, decreased energy - - - - -  Change in appetite - - - - -  Feeling bad or failure about yourself  - - - - -  Trouble concentrating - - - - -  Moving slowly or fidgety/restless - - - - -  Suicidal thoughts - - - - -  PHQ-9 Score - - - - -  Some recent data might be hidden    Allergies  Allergen Reactions   Aubagio [Teriflunomide] Diarrhea    Pancreatitis, neuropathy, elevated glucose    Betaseron [Interferon Beta-1b] Other (See Comments)    Increased LFT's   Provigil [Modafinil] Swelling    Tongue swelling and sores   Topamax [Topiramate]     Cognitive slowing   Doxycycline Other (See Comments)    Thrush,dizziness   Social History   Social History Narrative   The patient lives with husband. 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    Patient drinks occasionally, does not chew tobacco, does not use recreational drugs. She does drink occasional caffeine. She does wear her seatbelt. She does wear bike helmet riding a bike. She does exercise 3 times a week. She does not wear hearing aids or dentures. There is a smoke alarm in her home. There are no firearms in her home. She feels safe in her relationship. She has never experienced physical abuse.   Patient sleeps 7-8 hours a night.   Patient drinks 2 cups of caffeine daily.   Patient is right handed.   Past Medical History:  Diagnosis Date   Basal cell carcinoma    Brain bleed ([place]) 2017   COVID-19 08/2019   DM (diabetes mellitus) ([place])    Former smoker    Glossitis, benign migratory 08/31/2019   Goiter, unspecified    History of colonic polyps 2012  Multiple sclerosis (HCC)    Non-ST elevated myocardial infarction (non-STEMI) (HCC)    Olecranon bursitis of left elbow 10/17/2017   Oral mucositis 08/31/2019   Oral mucositis 08/31/2019   SCCA (squamous cell carcinoma) of skin    right shin   SEMI (subendocardial myocardial infarction) ([place]) 09/29/09   TOTAL RCA WITH STENT TO OM BRANCH   Shingles    Squamous cell carcinoma 2019   lower ext   Traumatic rupture of volar plate of left ring finger 03/25/2020   Unspecified essential hypertension    Unspecified hypothyroidism    Past Surgical History:  Procedure Laterality Date   CATARACT SURGERY  12/10   CESAREAN SECTION     CORONARY ANGIOPLASTY WITH STENT PLACEMENT  2011   KNEE SURGERY     leg surgery     squamous ccell removed     TONSILLECTOMY     VESICOVAGINAL FISTULA CLOSURE W/ TAH     Family History  Problem Relation Age of Onset   Hypertension Mother    Heart failure Father    Diabetes Father    Aneurysm Brother        THORACIC/ABD ANEURYSM   Prostate cancer Brother 83   Diabetes Brother    Fibromyalgia Sister    Brain cancer Maternal Grandmother 59   Multiple sclerosis Neg Hx    Colon cancer Neg Hx    Esophageal cancer Neg Hx    Allergies as of 05/27/2021       Reactions   Aubagio [teriflunomide] Diarrhea   Pancreatitis, neuropathy, elevated glucose   Betaseron [interferon Beta-1b] Other (See Comments)   Increased LFT's   Provigil [modafinil] Swelling   Tongue swelling and sores   Topamax [topiramate]    Cognitive slowing   Doxycycline Other (See Comments)   Thrush,dizziness        Medication List        Accurate as of May 27, 2021  4:19 PM. If you have any questions, ask your nurse or doctor.          STOP taking these medications    cholestyramine 4 g packet Commonly known as: Questran Stopped by: Howard Pouch, DO       TAKE these medications    amLODipine 10 MG tablet Commonly known as: NORVASC TAKE 1 TABLET BY MOUTH EVERY DAY   aspirin EC 81 MG tablet Take 1 tablet (81 mg total) daily by mouth.   B COMPLEX-B12 PO Take by mouth.   carvedilol 12.5 MG tablet Commonly known as: COREG Take 1 tablet (12.5 mg total) by mouth 2 (two) times daily.   cholecalciferol 1000 units tablet Commonly known as: VITAMIN D Take 1,000 Units by mouth daily.   fenofibrate 160 MG tablet Take 1 tablet (160 mg total) by mouth daily.   fish oil-omega-3 fatty acids 1000 MG capsule Take 1 g by mouth daily.   gabapentin 600 MG tablet Commonly known as: NEURONTIN TAKE 1 TABLET BY MOUTH THREE TIMES A DAY   glimepiride 4 MG tablet Commonly known as: AMARYL 1 tab BID   hydrochlorothiazide 50 MG tablet Commonly known as: HYDRODIURIL Take 1 tablet (50 mg total) by mouth  daily.   Iron 325 (65 Fe) MG Tabs Take 325 mg by mouth daily.   levothyroxine 112 MCG tablet Commonly known as: SYNTHROID Take 1 tablet (112 mcg total) by mouth daily.   losartan 100 MG tablet Commonly known as: COZAAR TAKE 1 TABLET BY MOUTH DAILY. PLEASE KEEP UPCOMING APPOINTMENT IN APRIL  2022 FOR FUTURE REFILLS. THANK YOU   metFORMIN 1000 MG tablet Commonly known as: GLUCOPHAGE Take 1 tablet (1,000 mg total) by mouth daily with breakfast. What changed: when to take this   mirtazapine 15 MG tablet Commonly known as: Remeron Take 1 tablet (15 mg total) by mouth at bedtime.   nitroGLYCERIN 0.4 MG SL tablet Commonly known as: Nitrostat Place 1 tablet (0.4 mg total) under the tongue every 5 (five) minutes x 3 doses as needed for chest pain.   pantoprazole 20 MG tablet Commonly known as: PROTONIX Take 1 tablet (20 mg total) by mouth daily.   potassium chloride 10 MEQ tablet Commonly known as: KLOR-CON Take 1 tablet (10 mEq total) by mouth daily.   prasugrel 10 MG Tabs tablet Commonly known as: EFFIENT Take 1 tablet (10 mg total) by mouth daily.   rosuvastatin 10 MG tablet Commonly known as: CRESTOR Take 1 tablet (10 mg total) by mouth daily.   venlafaxine XR 75 MG 24 hr capsule Commonly known as: Effexor XR Take 3 capsules (225 mg total) by mouth daily with breakfast.        All past medical history, surgical history, allergies, family history, immunizations andmedications were updated in the EMR today and reviewed under the history and medication portions of their EMR.     ROS: Negative, with the exception of above mentioned in HPI   Objective:  BP 130/75   Pulse 68   Temp 98.3 F (36.8 C) (Oral)   Wt 177 lb (80.3 kg)   SpO2 100%   BMI 29.01 kg/m  Body mass index is 29.01 kg/m. Gen: Afebrile. No acute distress. Nontoxic in appearance, well developed, well nourished.  HENT: AT. [place].  No cough.  No hoarseness. Eyes:Pupils Equal Round Reactive to light,  Extraocular movements intact,  Conjunctiva without redness, discharge or icterus. Abd: Soft. NTND. BS present.   MSK: No CVA tenderness  Neuro: Normal gait. PERLA. EOMi. Alert. Oriented x3  Psych: Normal affect, dress and demeanor. Normal speech. Normal thought content and judgment.  No results found. No results found. Results for orders placed or performed in visit on 05/27/21 (from the past 24 hour(s))  POCT Urinalysis Dipstick     Status: Abnormal   Collection Time: 05/27/21  4:11 PM  Result Value Ref Range   Color, UA yellow    Clarity, UA cloudy    Glucose, UA Positive (A) Negative   Bilirubin, UA negative    Ketones, UA negative    Spec Grav, UA 1.010 1.010 - 1.025   Blood, UA negative    pH, UA 6.5 5.0 - 8.0   Protein, UA Positive (A) Negative   Urobilinogen, UA negative (A) 0.2 or 1.0 E.U./dL   Nitrite, UA negative    Leukocytes, UA Negative Negative   Appearance     Odor      Assessment/Plan: Daisy Bennett is a 65 y.o. female present for OV for  Dysuria/glucosuria  Discussed glucose in her urine with her today.  She will hydrate well over the weekend.  Urine culture sent. Elected to start treatment with [facility] and twice daily x5 days since she appears uncomfortable. - POCT Urinalysis Dipstick - Urinalysis w microscopic + reflex cultur Patient will be called with results and further plan discussed at that time.  Reviewed expectations re: course of current medical issues. Discussed self-management of symptoms. Outlined signs and symptoms indicating need for more acute intervention. Patient verbalized understanding and all questions were answered. Patient received  an Scientific laboratory technician.    Orders Placed This Encounter  Procedures   Urinalysis w microscopic + reflex cultur   POCT Urinalysis Dipstick   No orders of the defined types were placed in this encounter.  Referral Orders  No referral(s) requested today     Note is dictated utilizing voice  recognition software. Although note has been proof read prior to signing, occasional typographical errors still can be missed. If any questions arise, please do not hesitate to call for verification.   electronically signed by:  Howard Pouch, DO  [place]

## 2021-05-30 LAB — URINE CULTURE
MICRO NUMBER:: 12558573
SPECIMEN QUALITY:: ADEQUATE

## 2021-05-30 LAB — URINALYSIS W MICROSCOPIC + REFLEX CULTURE
Bilirubin Urine: NEGATIVE
Hgb urine dipstick: NEGATIVE
Hyaline Cast: NONE SEEN /LPF
Ketones, ur: NEGATIVE
Nitrites, Initial: POSITIVE — AB
Protein, ur: NEGATIVE
Specific Gravity, Urine: 1.013 (ref 1.001–1.035)
Squamous Epithelial / HPF: NONE SEEN /HPF (ref <=5)
pH: 6.5 (ref 5.0–8.0)

## 2021-05-30 LAB — CULTURE INDICATED

## 2021-06-01 ENCOUNTER — Other Ambulatory Visit: Payer: Self-pay

## 2021-06-01 ENCOUNTER — Encounter: Payer: Self-pay | Admitting: Family Medicine

## 2021-06-01 ENCOUNTER — Ambulatory Visit (INDEPENDENT_AMBULATORY_CARE_PROVIDER_SITE_OTHER): Payer: Medicare Other | Admitting: Family Medicine

## 2021-06-01 VITALS — BP 131/73 | HR 65 | Temp 98.0°F | Wt 178.0 lb

## 2021-06-01 DIAGNOSIS — I10 Essential (primary) hypertension: Secondary | ICD-10-CM

## 2021-06-01 DIAGNOSIS — E782 Mixed hyperlipidemia: Secondary | ICD-10-CM | POA: Diagnosis not present

## 2021-06-01 DIAGNOSIS — G35 Multiple sclerosis: Secondary | ICD-10-CM | POA: Diagnosis not present

## 2021-06-01 DIAGNOSIS — I25118 Atherosclerotic heart disease of native coronary artery with other forms of angina pectoris: Secondary | ICD-10-CM

## 2021-06-01 DIAGNOSIS — E118 Type 2 diabetes mellitus with unspecified complications: Secondary | ICD-10-CM

## 2021-06-01 DIAGNOSIS — D6869 Other thrombophilia: Secondary | ICD-10-CM

## 2021-06-01 DIAGNOSIS — N1832 Chronic kidney disease, stage 3b: Secondary | ICD-10-CM

## 2021-06-01 DIAGNOSIS — E034 Atrophy of thyroid (acquired): Secondary | ICD-10-CM

## 2021-06-01 LAB — POCT GLYCOSYLATED HEMOGLOBIN (HGB A1C)
HbA1c POC (<> result, manual entry): 7.6 % (ref 4.0–5.6)
HbA1c, POC (controlled diabetic range): 7.6 % — AB (ref 0.0–7.0)
HbA1c, POC (prediabetic range): 7.6 % — AB (ref 5.7–6.4)
Hemoglobin A1C: 7.6 % — AB (ref 4.0–5.6)

## 2021-06-01 MED ORDER — PANTOPRAZOLE SODIUM 20 MG PO TBEC: 20.0000 mg | DELAYED_RELEASE_TABLET | Freq: Every day | ORAL | 1 refills | Status: DC

## 2021-06-01 MED ORDER — GLIMEPIRIDE 4 MG PO TABS: ORAL_TABLET | ORAL | 1 refills | Status: DC

## 2021-06-01 MED ORDER — METFORMIN HCL 1000 MG PO TABS: 1000.0000 mg | ORAL_TABLET | Freq: Every day | ORAL | 1 refills | Status: DC

## 2021-06-01 MED ORDER — HYDROCHLOROTHIAZIDE 50 MG PO TABS: 50.0000 mg | ORAL_TABLET | Freq: Every day | ORAL | 1 refills | Status: DC

## 2021-06-01 MED ORDER — CIPROFLOXACIN HCL 250 MG PO TABS: 250.0000 mg | ORAL_TABLET | Freq: Two times a day (BID) | ORAL | 0 refills | Status: AC

## 2021-06-01 MED ORDER — CARVEDILOL 12.5 MG PO TABS: 12.5000 mg | ORAL_TABLET | Freq: Two times a day (BID) | ORAL | 1 refills | Status: DC

## 2021-06-01 MED ORDER — VENLAFAXINE HCL ER 75 MG PO CP24: 225.0000 mg | ORAL_CAPSULE | Freq: Every day | ORAL | 1 refills | Status: DC

## 2021-06-01 MED ORDER — MIRTAZAPINE 15 MG PO TABS: 15.0000 mg | ORAL_TABLET | Freq: Every day | ORAL | 1 refills | Status: DC

## 2021-06-01 NOTE — Patient Instructions (Signed)
Great to see you today.  I have refilled the medication(s) we provide.   If labs were collected, we will inform you of lab results once received either by echart message or telephone call.   - echart message- for normal results that have been seen by the patient already.   - telephone call: abnormal results or if patient has not viewed results in their echart.  

## 2021-06-01 NOTE — Progress Notes (Signed)
This visit occurred during the SARS-CoV-2 public health emergency.  Safety protocols were in place, including screening questions prior to the visit, additional usage of staff PPE, and extensive cleaning of exam room while observing appropriate contact time as indicated for disinfecting solutions.    Daisy Bennett , [birth year], 65 y.o., female MRN: [id number] Patient Care Team    Relationship Specialty Notifications Start End  Ma Hillock, DO PCP - General Family Medicine  03/10/15   Josue Hector, MD PCP - Cardiology Cardiology Admissions 05/31/18   [place], Jennefer Bravo, MD Referring Physician Dermatology  02/10/16   Kathrynn Ducking, MD Consulting Physician Neurology  05/18/16   Linda Hedges, [place] Physician Obstetrics and Gynecology  06/04/16   Anell Barr, OD  Optometry  08/17/16     Chief Complaint  Patient presents with   Diabetes    [place]; pt is not fasting      Subjective: .Daisy Bennett is a 65 y.o. female present for [facility] follow up. Diabetes/morbid obesity: She reports compliance  with Amaryl  4 mg bid daily and metformin 1000 mg qd and she is tolerating them-increased last appt. She is reporting FBG ~160-180. Patient denies dizziness, hyperglycemic or hypoglycemic events. Patient denies numbness, tingling in the extremities or nonhealing wounds of feet.  She is prescribed gabapentin 600 mg BID by her neurologist.   Hypertension/hyperlipidemia: She reports compliance with her medication regimen of Crestor 10 mg daily, Effient 10 mg daily, losartan 100 mg daily, HCTZ 50 mg daily, fish oil 1000 mg daily, fenofibrate 160 mg daily Norvasc 10 mg, baby aspirin daily, Coreg 6.25 mg twice daily . H/O CAD, NSTEMI, post stent 2011 ([facility])-collateralized RCA.  She follows with Dr. Johnsie Cancel. Patient denies chest pain, shortness of breath, dizziness or lower extremity edema.  Exercise: routine   depression/MS:  Patient reports compliance withEffexor at 225 mg daily and  remeron 15 mg qhs.      Depression screen [facility] 2/9 05/27/2021 11/26/2020 07/09/2020 03/25/2020 07/04/2019  Decreased Interest 0 0 0 0 0  Down, Depressed, Hopeless 0 0 0 0 0  PHQ - 2 Score 0 0 0 0 0  Altered sleeping - - - - -  Tired, decreased energy - - - - -  Change in appetite - - - - -  Feeling bad or failure about yourself  - - - - -  Trouble concentrating - - - - -  Moving slowly or fidgety/restless - - - - -  Suicidal thoughts - - - - -  PHQ-9 Score - - - - -  Some recent data might be hidden    Allergies  Allergen Reactions   Aubagio [Teriflunomide] Diarrhea    Pancreatitis, neuropathy, elevated glucose   Betaseron [Interferon Beta-1b] Other (See Comments)    Increased LFT's   Provigil [Modafinil] Swelling    Tongue swelling and sores   Topamax [Topiramate]     Cognitive slowing   Doxycycline Other (See Comments)    Thrush,dizziness   Social History   Social History Narrative   The patient lives with husband. 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    Patient drinks occasionally, does not chew tobacco, does not use recreational drugs. She does drink occasional caffeine. She does wear her seatbelt. She does wear bike helmet riding a bike. She does exercise 3 times a week. She does not wear hearing aids or dentures. There is a smoke alarm in her  home. There are no firearms in her home. She feels safe in her relationship. She has never experienced physical abuse.   Patient sleeps 7-8 hours a night.   Patient drinks 2 cups of caffeine daily.   Patient is right handed.   Past Medical History:  Diagnosis Date   Basal cell carcinoma    Brain bleed ([place]) 2017   COVID-19 08/2019   DM (diabetes mellitus) ([place])    Former smoker    Glossitis, benign migratory 08/31/2019   Goiter, unspecified    History of colonic polyps 2012   Multiple sclerosis (HCC)    Non-ST elevated myocardial infarction (non-STEMI) (HCC)    Olecranon bursitis of left elbow 10/17/2017    Oral mucositis 08/31/2019   Oral mucositis 08/31/2019   SCCA (squamous cell carcinoma) of skin    right shin   SEMI (subendocardial myocardial infarction) ([place]) 09/29/09   TOTAL RCA WITH STENT TO OM BRANCH   Shingles    Squamous cell carcinoma 2019   lower ext   Traumatic rupture of volar plate of left ring finger 03/25/2020   Unspecified essential hypertension    Unspecified hypothyroidism    Past Surgical History:  Procedure Laterality Date   CATARACT SURGERY  12/10   CESAREAN SECTION     CORONARY ANGIOPLASTY WITH STENT PLACEMENT  2011   KNEE SURGERY     leg surgery     squamous ccell removed    TONSILLECTOMY     VESICOVAGINAL FISTULA CLOSURE W/ TAH     Family History  Problem Relation Age of Onset   Hypertension Mother    Heart failure Father    Diabetes Father    Aneurysm Brother        THORACIC/ABD ANEURYSM   Prostate cancer Brother 39   Diabetes Brother    Fibromyalgia Sister    Brain cancer Maternal Grandmother 77   Multiple sclerosis Neg Hx    Colon cancer Neg Hx    Esophageal cancer Neg Hx    Allergies as of 06/01/2021       Reactions   Aubagio [teriflunomide] Diarrhea   Pancreatitis, neuropathy, elevated glucose   Betaseron [interferon Beta-1b] Other (See Comments)   Increased LFT's   Provigil [modafinil] Swelling   Tongue swelling and sores   Topamax [topiramate]    Cognitive slowing   Doxycycline Other (See Comments)   Thrush,dizziness        Medication List        Accurate as of June 01, 2021 10:52 AM. If you have any questions, ask your nurse or doctor.          amLODipine 10 MG tablet Commonly known as: NORVASC TAKE 1 TABLET BY MOUTH EVERY DAY   aspirin EC 81 MG tablet Take 1 tablet (81 mg total) daily by mouth.   B COMPLEX-B12 PO Take by mouth.   carvedilol 12.5 MG tablet Commonly known as: COREG Take 1 tablet (12.5 mg total) by mouth 2 (two) times daily.   cefdinir 300 MG capsule Commonly known as: OMNICEF Take 1  capsule (300 mg total) by mouth 2 (two) times daily.   cholecalciferol 1000 units tablet Commonly known as: VITAMIN D Take 1,000 Units by mouth daily.   fenofibrate 160 MG tablet Take 1 tablet (160 mg total) by mouth daily.   fish oil-omega-3 fatty acids 1000 MG capsule Take 1 g by mouth daily.   gabapentin 600 MG tablet Commonly known as: NEURONTIN TAKE 1 TABLET BY MOUTH THREE TIMES A  DAY   glimepiride 4 MG tablet Commonly known as: AMARYL 1 tab BID   hydrochlorothiazide 50 MG tablet Commonly known as: HYDRODIURIL Take 1 tablet (50 mg total) by mouth daily.   Iron 325 (65 Fe) MG Tabs Take 325 mg by mouth daily.   levothyroxine 112 MCG tablet Commonly known as: SYNTHROID Take 1 tablet (112 mcg total) by mouth daily.   losartan 100 MG tablet Commonly known as: COZAAR TAKE 1 TABLET BY MOUTH DAILY. PLEASE KEEP UPCOMING APPOINTMENT IN APRIL 2022 FOR FUTURE REFILLS. THANK YOU   metFORMIN 1000 MG tablet Commonly known as: GLUCOPHAGE Take 1 tablet (1,000 mg total) by mouth daily with breakfast. What changed: when to take this   mirtazapine 15 MG tablet Commonly known as: Remeron Take 1 tablet (15 mg total) by mouth at bedtime.   nitroGLYCERIN 0.4 MG SL tablet Commonly known as: Nitrostat Place 1 tablet (0.4 mg total) under the tongue every 5 (five) minutes x 3 doses as needed for chest pain.   pantoprazole 20 MG tablet Commonly known as: PROTONIX Take 1 tablet (20 mg total) by mouth daily.   potassium chloride 10 MEQ tablet Commonly known as: KLOR-CON Take 1 tablet (10 mEq total) by mouth daily.   prasugrel 10 MG Tabs tablet Commonly known as: EFFIENT Take 1 tablet (10 mg total) by mouth daily.   rosuvastatin 10 MG tablet Commonly known as: CRESTOR Take 1 tablet (10 mg total) by mouth daily.   venlafaxine XR 75 MG 24 hr capsule Commonly known as: Effexor XR Take 3 capsules (225 mg total) by mouth daily with breakfast.        All past medical history,  surgical history, allergies, family history, immunizations andmedications were updated in the EMR today and reviewed under the history and medication portions of their EMR.     ROS: Negative, with the exception of above mentioned in HPI   Objective:  BP 131/73   Pulse 65   Temp 98 F (36.7 C) (Oral)   Wt 178 lb (80.7 kg)   SpO2 100%   BMI 29.17 kg/m  Body mass index is 29.17 kg/m. Gen: Afebrile. No acute distress. Nontoxic, pleasant female.  HENT: AT. [place].  Eyes:Pupils Equal Round Reactive to light, Extraocular movements intact,  Conjunctiva without redness, discharge or icterus. Neck/lymp/endocrine: Supple,no lymphadenopathy, no thyromegaly CV: RRR , no edema Chest: CTAB, no wheeze or crackles MSK: no cvc tenderness Skin: no rashes, purpura or petechiae.  Neuro: Normal gait. PERLA. EOMi. Alert. Oriented x3 Psych: Normal affect, dress and demeanor. Normal speech. Normal thought content and judgment..     No results found. No results found. Results for orders placed or performed in visit on 06/01/21 (from the past 24 hour(s))  POCT HgB A1C     Status: Abnormal   Collection Time: 06/01/21 10:50 AM  Result Value Ref Range   Hemoglobin A1C 7.6 (A) 4.0 - 5.6 %   HbA1c POC (<> result, manual entry) 7.6 4.0 - 5.6 %   HbA1c, POC (prediabetic range) 7.6 (A) 5.7 - 6.4 %   HbA1c, POC (controlled diabetic range) 7.6 (A) 0.0 - 7.0 %    Assessment/Plan: Daisy Bennett is a 65 y.o. female present for OV for [facility] Diabetes mellitus with complication (HCC)/Morbid obesity - improving. Will keep meds the same for now nad she will work on her diet and exercise.  - continue Amaryl to 4 mg BID -continue Metformin 1000 mg daily (decreased secondary to kidney function) - increase  exercise regimen and continue dietary modification> cut back on sugary drinks. - continue   gabapentin supplied by neuro  POCT HgB A1C--> 5.9> 6.1> 6.6> 8.6> 7.6 today - prevnar 20 completed  -  foot exam 03/03/2021 -  Eye exam: UTD 02/11/2021 UTD.  Dr. Venetia Constable, yearly eye exams - Microalbumin: on an ARB - flu shot UTD  Hypertension/hyperlipidemia/Antiplatelet or antithrombotic long-term use/athersclerosis/hypokalemia/s/p stent placement:  Stable.  - follows with cardiology, Dr. Johnsie Cancel who prescribes medications.  - low salt, increase exercise.  - continue amlodipine 10, losartan 100, - continue   coreg  12.5 mg BID  - continue hctz 50 mg Qd - continue Kdur 10 meq/d - continue fenofibrate - continue  statin - continue  Effient and baby aspirin.  Cardiology feels she needs to continue both. - maintain routine cardiology follow ups.     Depression Stable.  - continue  Effexor 225 mg daily - continue gabapentin -prescribed by neurology - continue  remeron 15 mg nightly  Multiple sclerosis ([place]) Managed by neuro. -currently off all meds except gaba from neuro.  hypothyroid Labs due next visit.  Continue levo 112 mcg.   Next appt 4  mos for cmc  Reviewed expectations re: course of current medical issues. Discussed self-management of symptoms. Outlined signs and symptoms indicating need for more acute intervention. Patient verbalized understanding and all questions were answered. Patient received an After-Visit Summary.    Orders Placed This Encounter  Procedures   POCT HgB A1C    No orders of the defined types were placed in this encounter.   Referral Orders  No referral(s) requested today     Note is dictated utilizing voice recognition software. Although note has been proof read prior to signing, occasional typographical errors still can be missed. If any questions arise, please do not hesitate to call for verification.   electronically signed by:  Howard Pouch, DO  [place]

## 2021-06-18 DIAGNOSIS — M25562 Pain in left knee: Secondary | ICD-10-CM | POA: Diagnosis not present

## 2021-06-18 DIAGNOSIS — M2392 Unspecified internal derangement of left knee: Secondary | ICD-10-CM | POA: Diagnosis not present

## 2021-06-20 DIAGNOSIS — M25562 Pain in left knee: Secondary | ICD-10-CM | POA: Diagnosis not present

## 2021-06-27 ENCOUNTER — Other Ambulatory Visit: Payer: Self-pay | Admitting: Neurology

## 2021-07-02 DIAGNOSIS — M170 Bilateral primary osteoarthritis of knee: Secondary | ICD-10-CM | POA: Diagnosis not present

## 2021-07-13 ENCOUNTER — Ambulatory Visit (AMBULATORY_SURGERY_CENTER): Payer: Self-pay | Admitting: *Deleted

## 2021-07-13 ENCOUNTER — Other Ambulatory Visit: Payer: Self-pay

## 2021-07-13 VITALS — Ht 65.5 in | Wt 177.0 lb

## 2021-07-13 DIAGNOSIS — R634 Abnormal weight loss: Secondary | ICD-10-CM

## 2021-07-13 DIAGNOSIS — K529 Noninfective gastroenteritis and colitis, unspecified: Secondary | ICD-10-CM

## 2021-07-13 MED ORDER — PEG 3350-KCL-NA BICARB-NACL 420 G PO SOLR: 4000.0000 mL | Freq: Once | ORAL | 0 refills | Status: AC

## 2021-07-13 NOTE — Progress Notes (Signed)
Patient is here in-person for PV. Patient denies any allergies to eggs or soy. Patient denies any problems with anesthesia/sedation. Patient is not on any oxygen at home. Patient is not taking any diet/weight loss medications. Pt is on blood thinner-she has stopped this-last dose Friday 07/10/21 per pt. Patient is aware of our care-partner policy and [id number] safety protocol. Patient denies any medical chart hx changes since last gi ov.  EMMI education assigned to the patient for the procedure, sent to [place].   Patient is COVID-19 vaccinated.

## 2021-07-14 DIAGNOSIS — M65832 Other synovitis and tenosynovitis, left forearm: Secondary | ICD-10-CM | POA: Diagnosis not present

## 2021-07-14 DIAGNOSIS — M1712 Unilateral primary osteoarthritis, left knee: Secondary | ICD-10-CM | POA: Diagnosis not present

## 2021-07-14 DIAGNOSIS — R2232 Localized swelling, mass and lump, left upper limb: Secondary | ICD-10-CM | POA: Diagnosis not present

## 2021-07-14 DIAGNOSIS — M13862 Other specified arthritis, left knee: Secondary | ICD-10-CM | POA: Diagnosis not present

## 2021-07-15 ENCOUNTER — Encounter: Payer: Self-pay | Admitting: Certified Registered Nurse Anesthetist

## 2021-07-16 ENCOUNTER — Ambulatory Visit (AMBULATORY_SURGERY_CENTER): Payer: Medicare Other | Admitting: Gastroenterology

## 2021-07-16 ENCOUNTER — Encounter: Payer: Self-pay | Admitting: Gastroenterology

## 2021-07-16 ENCOUNTER — Other Ambulatory Visit: Payer: Self-pay

## 2021-07-16 VITALS — BP 150/71 | HR 69 | Temp 97.1°F | Resp 11 | Ht 65.0 in | Wt 177.0 lb

## 2021-07-16 DIAGNOSIS — D122 Benign neoplasm of ascending colon: Secondary | ICD-10-CM | POA: Diagnosis not present

## 2021-07-16 DIAGNOSIS — Z0389 Encounter for observation for other suspected diseases and conditions ruled out: Secondary | ICD-10-CM | POA: Diagnosis not present

## 2021-07-16 DIAGNOSIS — K635 Polyp of colon: Secondary | ICD-10-CM | POA: Diagnosis not present

## 2021-07-16 DIAGNOSIS — K529 Noninfective gastroenteritis and colitis, unspecified: Secondary | ICD-10-CM | POA: Diagnosis not present

## 2021-07-16 DIAGNOSIS — I2510 Atherosclerotic heart disease of native coronary artery without angina pectoris: Secondary | ICD-10-CM | POA: Diagnosis not present

## 2021-07-16 MED ORDER — SODIUM CHLORIDE 0.9 % IV SOLN: 500.0000 mL | Freq: Once | INTRAVENOUS | Status: DC

## 2021-07-16 NOTE — Op Note (Signed)
[place] Patient Name: Daisy Bennett Procedure Date: 07/16/2021 8:34 AM MRN: [id number] Endoscopist: Mallie Mussel L. Loletha Carrow , MD Age: 65 Referring MD:  Date of Birth: [birth year] Gender: Female Account #: [id number] Procedure:                Colonoscopy Indications:              Chronic diarrhea Medicines:                Monitored Anesthesia Care Procedure:                Pre-Anesthesia Assessment:                           - Prior to the procedure, a History and Physical                            was performed, and patient medications and                            allergies were reviewed. The patient's tolerance of                            previous anesthesia was also reviewed. The risks                            and benefits of the procedure and the sedation                            options and risks were discussed with the patient.                            All questions were answered, and informed consent                            was obtained. Prior Anticoagulants: The patient has                            taken Effient (prasugrel), last dose was 5 days                            prior to procedure. ASA Grade Assessment: III - A                            patient with severe systemic disease. After                            reviewing the risks and benefits, the patient was                            deemed in satisfactory condition to undergo the                            procedure.  After obtaining informed consent, the colonoscope                            was passed under direct vision. Throughout the                            procedure, the patient's blood pressure, pulse, and                            oxygen saturations were monitored continuously. The                            Olympus CF-HQ190L [id number]) Colonoscope was                            introduced through the anus and advanced to the the                             cecum, identified by appendiceal orifice and                            ileocecal valve. The colonoscopy was performed with                            difficulty due to a redundant colon and significant                            looping. Successful completion of the procedure was                            aided by changing the patient to a semi-prone                            position, using manual pressure and straightening                            and shortening the scope to obtain bowel loop                            reduction. The patient tolerated the procedure                            well. The quality of the bowel preparation was good                            after lavage. The ileocecal valve, appendiceal                            orifice, and rectum were photographed.(TI could not                            be intubated due to scope looping) The bowel  preparation used was GoLYTELY. Scope In: 8:55:03 AM Scope Out: 9:18:03 AM Scope Withdrawal Time: 0 hours 13 minutes 56 seconds  Total Procedure Duration: 0 hours 23 minutes 0 seconds  Findings:                 The digital rectal exam findings include decreased                            sphincter tone.                           Normal mucosa was found in the entire colon.                            Biopsies for histology were taken with a cold                            forceps from the right colon and left colon for                            evaluation of microscopic colitis.                           Two semi-sessile polyps were found in the proximal                            ascending colon and distal ascending colon. The                            polyps were 2 to 5 mm in size. These polyps were                            removed with a cold snare. Resection and retrieval                            were complete.                           The exam was otherwise without abnormality on                             direct and retroflexion views. Complications:            No immediate complications. Estimated Blood Loss:     Estimated blood loss was minimal. Impression:               - Decreased sphincter tone found on digital rectal                            exam.                           - Normal mucosa in the entire examined colon.                            Biopsied.                           -  Two 2 to 5 mm polyps in the proximal ascending                            colon and in the distal ascending colon, removed                            with a cold snare. Resected and retrieved.                           - The examination was otherwise normal on direct                            and retroflexion views. Recommendation:           - Patient has a contact number available for                            emergencies. The signs and symptoms of potential                            delayed complications were discussed with the                            patient. Return to normal activities tomorrow.                            Written discharge instructions were provided to the                            patient.                           - Resume previous diet.                           - Resume Effient (prasugrel) at prior dose tomorrow.                           - Await pathology results.                           - Repeat colonoscopy is recommended for                            surveillance. The colonoscopy date will be                            determined after pathology results from today's                            exam become available for review.                           - Return to my office at appointment to be  scheduled. Fayrene Towner L. Loletha Carrow, MD 07/16/2021 9:25:00 AM This report has been signed electronically.

## 2021-07-16 NOTE — Patient Instructions (Signed)
You may resume your Effient tomorrow.  Read all of the handouts given to you by your recovery room nurse.  YOU HAD AN ENDOSCOPIC PROCEDURE TODAY AT [facility]:   Refer to the procedure report that was given to you for any specific questions about what was found during the examination.  If the procedure report does not answer your questions, please call your gastroenterologist to clarify.  If you requested that your care partner not be given the details of your procedure findings, then the procedure report has been included in a sealed envelope for you to review at your convenience later.  YOU SHOULD EXPECT: Some feelings of bloating in the abdomen. Passage of more gas than usual.  Walking can help get rid of the air that was put into your GI tract during the procedure and reduce the bloating. If you had a lower endoscopy (such as a colonoscopy or flexible sigmoidoscopy) you may notice spotting of blood in your stool or on the toilet paper. If you underwent a bowel prep for your procedure, you may not have a normal bowel movement for a few days.  Please Note:  You might notice some irritation and congestion in your nose or some drainage.  This is from the oxygen used during your procedure.  There is no need for concern and it should clear up in a day or so.  SYMPTOMS TO REPORT IMMEDIATELY:  Following lower endoscopy (colonoscopy or flexible sigmoidoscopy):  Excessive amounts of blood in the stool  Significant tenderness or worsening of abdominal pains  Swelling of the abdomen that is new, acute  Fever of 100F or higher   For urgent or emergent issues, a gastroenterologist can be reached at any hour by calling [phone]. Do not use MyChart messaging for urgent concerns.    DIET:  We do recommend a small meal at first, but then you may proceed to your regular diet.  Drink plenty of fluids but you should avoid alcoholic beverages for 24 hours.  ACTIVITY:  You should plan to  take it easy for the rest of today and you should NOT DRIVE or use heavy machinery until tomorrow (because of the sedation medicines used during the test).    FOLLOW UP: Our staff will call the number listed on your records 48-72 hours following your procedure to check on you and address any questions or concerns that you may have regarding the information given to you following your procedure. If we do not reach you, we will leave a message.  We will attempt to reach you two times.  During this call, we will ask if you have developed any symptoms of COVID 19. If you develop any symptoms (ie: fever, flu-like symptoms, shortness of breath, cough etc.) before then, please call [phone].  If you test positive for Covid 19 in the 2 weeks post procedure, please call and report this information to [place].    If any biopsies were taken you will be contacted by phone or by letter within the next 1-3 weeks.  Please call us at [phone] if you have not heard about the biopsies in 3 weeks.    SIGNATURES/CONFIDENTIALITY: You and/or your care partner have signed paperwork which will be entered into your electronic medical record.  These signatures attest to the fact that that the information above on your After Visit Summary has been reviewed and is understood.  Full responsibility of the confidentiality of this discharge information lies with you and/or  your care-partner.

## 2021-07-16 NOTE — Progress Notes (Signed)
Called to room to assist during endoscopic procedure.  Patient ID and intended procedure confirmed with present staff. Received instructions for my participation in the procedure from the performing physician.  

## 2021-07-16 NOTE — Progress Notes (Signed)
Pt's states no medical or surgical changes since previsit or office visit. VS by CW. 

## 2021-07-16 NOTE — Progress Notes (Signed)
Report given to PACU, vss 

## 2021-07-16 NOTE — Progress Notes (Signed)
History and Physical:  This patient presents for endoscopic testing for: Encounter Diagnosis  Name Primary?   Chronic diarrhea Yes    Clinical details in 05/06/21 office note. Chronic diarrhea  - subsequent CTAP w/o evidence chronic pancreatitis Concern for microscopic colitis. Patient on Effient - held last 5 days  ROS: Patient denies chest pain or cough   Past Medical History: Past Medical History:  Diagnosis Date   Basal cell carcinoma    Brain bleed ([place]) 2017   COVID-19 08/2019   DM (diabetes mellitus) ([place])    Former smoker    Glossitis, benign migratory 08/31/2019   Goiter, unspecified    History of colonic polyps 2012   Multiple sclerosis ([place])    Non-ST elevated myocardial infarction (non-STEMI) ([facility])    Olecranon bursitis of left elbow 10/17/2017   Oral mucositis 08/31/2019   Oral mucositis 08/31/2019   SCCA (squamous cell carcinoma) of skin    right shin   SEMI (subendocardial myocardial infarction) ([place]) 09/29/09   TOTAL RCA WITH STENT TO OM BRANCH   Shingles    Squamous cell carcinoma 2019   lower ext   Traumatic rupture of volar plate of left ring finger 03/25/2020   Unspecified essential hypertension    Unspecified hypothyroidism      Past Surgical History: Past Surgical History:  Procedure Laterality Date   CATARACT SURGERY  12/10   CESAREAN SECTION     CORONARY ANGIOPLASTY WITH STENT PLACEMENT  2011   KNEE SURGERY     leg surgery     squamous ccell removed    TONSILLECTOMY     VESICOVAGINAL FISTULA CLOSURE W/ TAH      Allergies: Allergies  Allergen Reactions   Aubagio [Teriflunomide] Diarrhea    Pancreatitis, neuropathy, elevated glucose   Betaseron [Interferon Beta-1b] Other (See Comments)    Increased LFT's   Provigil [Modafinil] Swelling    Tongue swelling and sores   Topamax [Topiramate]     Cognitive slowing   Doxycycline Other (See Comments)    Thrush,dizziness    Outpatient Meds: Current Outpatient Medications  Medication Sig  Dispense Refill   amLODipine (NORVASC) 10 MG tablet TAKE 1 TABLET BY MOUTH EVERY DAY 90 tablet 3   aspirin EC 81 MG tablet Take 1 tablet (81 mg total) daily by mouth.     B Complex Vitamins (B COMPLEX-B12 PO) Take by mouth.     carvedilol (COREG) 12.5 MG tablet Take 1 tablet (12.5 mg total) by mouth 2 (two) times daily. 180 tablet 1   cholecalciferol (VITAMIN D) 1000 units tablet Take 1,000 Units by mouth daily.     fenofibrate 160 MG tablet Take 1 tablet (160 mg total) by mouth daily. 90 tablet 3   fish oil-omega-3 fatty acids 1000 MG capsule Take 1 g by mouth daily.     gabapentin (NEURONTIN) 600 MG tablet TAKE 1 TABLET BY MOUTH THREE TIMES A DAY 270 tablet 0   glimepiride (AMARYL) 4 MG tablet 1 tab BID 180 tablet 1   hydrochlorothiazide (HYDRODIURIL) 50 MG tablet Take 1 tablet (50 mg total) by mouth daily. 90 tablet 1   levothyroxine (SYNTHROID) 112 MCG tablet Take 1 tablet (112 mcg total) by mouth daily. 90 tablet 3   losartan (COZAAR) 100 MG tablet TAKE 1 TABLET BY MOUTH DAILY. PLEASE KEEP UPCOMING APPOINTMENT IN APRIL 2022 FOR FUTURE REFILLS. THANK YOU 90 tablet 2   metFORMIN (GLUCOPHAGE) 1000 MG tablet Take 1 tablet (1,000 mg total) by mouth daily with breakfast.  90 tablet 1   mirtazapine (REMERON) 15 MG tablet Take 1 tablet (15 mg total) by mouth at bedtime. 90 tablet 1   pantoprazole (PROTONIX) 20 MG tablet Take 1 tablet (20 mg total) by mouth daily. 90 tablet 1   potassium chloride (KLOR-CON) 10 MEQ tablet Take 1 tablet (10 mEq total) by mouth daily. 90 tablet 1   rosuvastatin (CRESTOR) 10 MG tablet Take 1 tablet (10 mg total) by mouth daily. 90 tablet 3   venlafaxine XR (EFFEXOR XR) 75 MG 24 hr capsule Take 3 capsules (225 mg total) by mouth daily with breakfast. 270 capsule 1   Ferrous Sulfate (IRON) 325 (65 FE) MG TABS Take 325 mg by mouth daily.     nitroGLYCERIN (NITROSTAT) 0.4 MG SL tablet Place 1 tablet (0.4 mg total) under the tongue every 5 (five) minutes x 3 doses as needed  for chest pain. 25 tablet 4   prasugrel (EFFIENT) 10 MG TABS tablet Take 1 tablet (10 mg total) by mouth daily. 90 tablet 3   Current Facility-Administered Medications  Medication Dose Route Frequency Provider Last Rate Last Admin   0.9 %  sodium chloride infusion  500 mL Intravenous Once Danis, Estill Cotta III, MD          ___________________________________________________________________ Objective   Exam:  BP (!) 152/72    Pulse 74    Temp (!) 97.1 F (36.2 C)    Ht 5\' 5"  (1.651 m)    Wt 177 lb (80.3 kg)    SpO2 100%    BMI 29.45 kg/m   CV: RRR without murmur, S1/S2 Resp: clear to auscultation bilaterally, normal RR and effort noted GI: soft, no tenderness, with active bowel sounds.   Assessment: Encounter Diagnosis  Name Primary?   Chronic diarrhea Yes     Plan: Colonoscopy  The benefits and risks of the planned procedure were described in detail with the patient or (when appropriate) their health care proxy.  Risks were outlined as including, but not limited to, bleeding, infection, perforation, adverse medication reaction leading to cardiac or pulmonary decompensation, pancreatitis (if ERCP).  The limitation of incomplete mucosal visualization was also discussed.  No guarantees or warranties were given.    The patient is appropriate for an endoscopic procedure in the ambulatory setting.   - Wilfrid Lund, MD

## 2021-07-17 ENCOUNTER — Telehealth: Payer: Self-pay

## 2021-07-17 NOTE — Telephone Encounter (Signed)
Per 07/16/21 procedure report - Return to my office at appt to be scheduled.  Called and spoke with patient to set up appt. She has been scheduled for a follow up with Dr. Loletha Carrow on Thursday, 08/20/21 at 2:20 pm. Patient verbalized understanding and had no concerns at the end of the call.

## 2021-07-20 ENCOUNTER — Telehealth: Payer: Self-pay

## 2021-07-20 NOTE — Telephone Encounter (Signed)
°  Follow up Call-  Call back number 07/16/2021 05/28/2019  Post procedure Call Back phone  # [phone]  Permission to leave phone message Yes Yes  Some recent data might be hidden     Patient questions:  Do you have a fever, pain , or abdominal swelling? No. Pain Score  0 *  Have you tolerated food without any problems? Yes.    Have you been able to return to your normal activities? Yes.    Do you have any questions about your discharge instructions: Diet   No. Medications  No. Follow up visit  No.  Do you have questions or concerns about your Care? No.  Actions: * If pain score is 4 or above: No action needed, pain <4.  Have you developed a fever since your procedure? no  2.   Have you had an respiratory symptoms (SOB or cough) since your procedure? no  3.   Have you tested positive for COVID 19 since your procedure no  4.   Have you had any family members/close contacts diagnosed with the COVID 19 since your procedure?  no   If yes to any of these questions please route to Joylene John, RN and Joella Prince, RN

## 2021-07-21 DIAGNOSIS — M13862 Other specified arthritis, left knee: Secondary | ICD-10-CM | POA: Diagnosis not present

## 2021-07-21 DIAGNOSIS — M1712 Unilateral primary osteoarthritis, left knee: Secondary | ICD-10-CM | POA: Diagnosis not present

## 2021-07-28 DIAGNOSIS — M13862 Other specified arthritis, left knee: Secondary | ICD-10-CM | POA: Diagnosis not present

## 2021-07-28 DIAGNOSIS — M1712 Unilateral primary osteoarthritis, left knee: Secondary | ICD-10-CM | POA: Diagnosis not present

## 2021-07-29 ENCOUNTER — Encounter: Payer: Self-pay | Admitting: Gastroenterology

## 2021-08-02 DIAGNOSIS — Z20822 Contact with and (suspected) exposure to covid-19: Secondary | ICD-10-CM | POA: Diagnosis not present

## 2021-08-04 DIAGNOSIS — Z124 Encounter for screening for malignant neoplasm of cervix: Secondary | ICD-10-CM | POA: Diagnosis not present

## 2021-08-04 LAB — HM MAMMOGRAPHY

## 2021-08-04 LAB — HM PAP SMEAR

## 2021-08-11 DIAGNOSIS — M65832 Other synovitis and tenosynovitis, left forearm: Secondary | ICD-10-CM | POA: Diagnosis not present

## 2021-08-12 ENCOUNTER — Ambulatory Visit (INDEPENDENT_AMBULATORY_CARE_PROVIDER_SITE_OTHER): Payer: Medicare Other | Admitting: Neurology

## 2021-08-12 ENCOUNTER — Encounter: Payer: Self-pay | Admitting: Neurology

## 2021-08-12 ENCOUNTER — Other Ambulatory Visit: Payer: Self-pay

## 2021-08-12 VITALS — BP 165/71 | HR 70 | Ht 65.0 in | Wt 177.0 lb

## 2021-08-12 DIAGNOSIS — R197 Diarrhea, unspecified: Secondary | ICD-10-CM | POA: Insufficient documentation

## 2021-08-12 DIAGNOSIS — G35 Multiple sclerosis: Secondary | ICD-10-CM | POA: Diagnosis not present

## 2021-08-12 DIAGNOSIS — G35D Multiple sclerosis, unspecified: Secondary | ICD-10-CM

## 2021-08-12 NOTE — Progress Notes (Signed)
PATIENT: Daisy Bennett DOB: 1956/03/09  REASON FOR VISIT: Follow up for MS HISTORY FROM: Patient PRIMARY NEUROLOGIST: Dr. Felecia Shelling since Dr. Jannifer Franklin retired   HISTORY OF PRESENT ILLNESS: Today 08/12/21 Daisy Bennett is here today for follow-up with history of MS.  She stopped Aubagio in September 2022 due to diarrhea.  She was previously switched from Tecfidera due to low absolute lymphocyte counts. Started Aubagio in March 2022, diarrhea started 1 month later. A1C increased, is now back under better control. Is seeing GI doctor, work up has been good. Diarrhea is a lot better. Stool sample showed pancreas wasn't functioning normal, will consider checking this again 1/19 with GI. BP increased as result as well, toes went numb. Has had 2 stabs of pain to left V3 area, radiates to lip, but overall much better. BP up today, took medication late. Remains on gabapentin 600 mg 2 times daily for back/leg pain. [name] looks good today.   HISTORY 04/13/2021 Dr. Jannifer Franklin: Daisy Bennett is a 66 year old right-handed white female with a history of multiple sclerosis and diabetes.  The patient apparently was diagnosed with multiple sclerosis at [facility] in the 1990s, was placed on Betaseron.  She was seen by Dr. Janann Colonel through our office in 2014 and had been on Tecfidera at that time until just recently.  She was noted to have low absolute lymphocyte counts and was switched over to Aubagio within the last 4 months or so but developed significant diarrhea but never realized it was the Aubagio causing it.  She is on metformin but this medication predates the onset of her diarrhea.  The patient has had a 20 pound weight loss over the last 4 months.  Over the last month she has noted some occasional electric shock sensations into the left lower face that is new for her.  She has not noted any new motor or sensory symptoms or any visual symptoms.  She claims that her last identifiable MS attack was in the 1990s  associated with left leg numbness.  She does have some sciatica pain in the left leg.  She comes to this office for an evaluation.   REVIEW OF SYSTEMS: Out of a complete 14 system review of symptoms, the patient complains only of the following symptoms, and all other reviewed systems are negative.  See HPI  ALLERGIES: Allergies  Allergen Reactions   Aubagio [Teriflunomide] Diarrhea    Pancreatitis, neuropathy, elevated glucose   Betaseron [Interferon Beta-1b] Other (See Comments)    Increased LFT's   Provigil [Modafinil] Swelling    Tongue swelling and sores   Topamax [Topiramate]     Cognitive slowing   Doxycycline Other (See Comments)    Thrush,dizziness    HOME MEDICATIONS: Outpatient Medications Prior to Visit  Medication Sig Dispense Refill   amLODipine (NORVASC) 10 MG tablet TAKE 1 TABLET BY MOUTH EVERY DAY 90 tablet 3   aspirin EC 81 MG tablet Take 1 tablet (81 mg total) daily by mouth.     B Complex Vitamins (B COMPLEX-B12 PO) Take by mouth.     carvedilol (COREG) 12.5 MG tablet Take 1 tablet (12.5 mg total) by mouth 2 (two) times daily. 180 tablet 1   cholecalciferol (VITAMIN D) 1000 units tablet Take 1,000 Units by mouth daily.     fenofibrate 160 MG tablet Take 1 tablet (160 mg total) by mouth daily. 90 tablet 3   Ferrous Sulfate (IRON) 325 (65 FE) MG TABS Take 325 mg by mouth daily.  fish oil-omega-3 fatty acids 1000 MG capsule Take 1 g by mouth daily.     gabapentin (NEURONTIN) 600 MG tablet TAKE 1 TABLET BY MOUTH THREE TIMES A DAY 270 tablet 0   glimepiride (AMARYL) 4 MG tablet 1 tab BID 180 tablet 1   hydrochlorothiazide (HYDRODIURIL) 50 MG tablet Take 1 tablet (50 mg total) by mouth daily. 90 tablet 1   levothyroxine (SYNTHROID) 112 MCG tablet Take 1 tablet (112 mcg total) by mouth daily. 90 tablet 3   losartan (COZAAR) 100 MG tablet TAKE 1 TABLET BY MOUTH DAILY. PLEASE KEEP UPCOMING APPOINTMENT IN APRIL 2022 FOR FUTURE REFILLS. THANK YOU 90 tablet 2    metFORMIN (GLUCOPHAGE) 1000 MG tablet Take 1 tablet (1,000 mg total) by mouth daily with breakfast. 90 tablet 1   mirtazapine (REMERON) 15 MG tablet Take 1 tablet (15 mg total) by mouth at bedtime. 90 tablet 1   nitroGLYCERIN (NITROSTAT) 0.4 MG SL tablet Place 1 tablet (0.4 mg total) under the tongue every 5 (five) minutes x 3 doses as needed for chest pain. 25 tablet 4   pantoprazole (PROTONIX) 20 MG tablet Take 1 tablet (20 mg total) by mouth daily. 90 tablet 1   potassium chloride (KLOR-CON) 10 MEQ tablet Take 1 tablet (10 mEq total) by mouth daily. 90 tablet 1   prasugrel (EFFIENT) 10 MG TABS tablet Take 1 tablet (10 mg total) by mouth daily. 90 tablet 3   rosuvastatin (CRESTOR) 10 MG tablet Take 1 tablet (10 mg total) by mouth daily. 90 tablet 3   venlafaxine XR (EFFEXOR XR) 75 MG 24 hr capsule Take 3 capsules (225 mg total) by mouth daily with breakfast. 270 capsule 1   No facility-administered medications prior to visit.    PAST MEDICAL HISTORY: Past Medical History:  Diagnosis Date   Basal cell carcinoma    Brain bleed ([place]) 2017   COVID-19 08/2019   DM (diabetes mellitus) ([place])    Former smoker    Glossitis, benign migratory 08/31/2019   Goiter, unspecified    History of colonic polyps 2012   Multiple sclerosis (HCC)    Non-ST elevated myocardial infarction (non-STEMI) (HCC)    Olecranon bursitis of left elbow 10/17/2017   Oral mucositis 08/31/2019   Oral mucositis 08/31/2019   SCCA (squamous cell carcinoma) of skin    right shin   SEMI (subendocardial myocardial infarction) ([place]) 09/29/09   TOTAL RCA WITH STENT TO OM BRANCH   Shingles    Squamous cell carcinoma 2019   lower ext   Traumatic rupture of volar plate of left ring finger 03/25/2020   Unspecified essential hypertension    Unspecified hypothyroidism     PAST SURGICAL HISTORY: Past Surgical History:  Procedure Laterality Date   CATARACT SURGERY  12/10   CESAREAN SECTION     CORONARY ANGIOPLASTY WITH STENT  PLACEMENT  2011   KNEE SURGERY     leg surgery     squamous ccell removed    TONSILLECTOMY     VESICOVAGINAL FISTULA CLOSURE W/ TAH      FAMILY HISTORY: Family History  Problem Relation Age of Onset   Hypertension Mother    Heart failure Father    Diabetes Father    Aneurysm Brother        THORACIC/ABD ANEURYSM   Prostate cancer Brother 18   Diabetes Brother    Fibromyalgia Sister    Brain cancer Maternal Grandmother 26   Multiple sclerosis Neg Hx    Colon cancer Neg  Hx    Esophageal cancer Neg Hx     SOCIAL HISTORY: Social History   Socioeconomic History   Marital status: Married    Spouse name: Not on file   Number of children: 2   Years of education: 14   Highest education level: Associate degree: academic program  Occupational History   Occupation: Therapist, music  Tobacco Use   Smoking status: Former    Packs/day: 1.00    Years: 15.00    Pack years: 15.00    Types: Cigarettes    Quit date: 12/16/2015    Years since quitting: 5.6   Smokeless tobacco: Never   Tobacco comments:    on and off smoker  Vaping Use   Vaping Use: Never used  Substance and Sexual Activity   Alcohol use: Yes    Alcohol/week: 0.0 standard drinks    Comment: OCCASIONAL ALCOHOL USE.   Drug use: No   Sexual activity: Yes  Other Topics Concern   Not on file  Social History Narrative   The patient lives with husband. 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    Patient drinks occasionally, does not chew tobacco, does not use recreational drugs. She does drink occasional caffeine. She does wear her seatbelt. She does wear bike helmet riding a bike. She does exercise 3 times a week. She does not wear hearing aids or dentures. There is a smoke alarm in her home. There are no firearms in her home. She feels safe in her relationship. She has never experienced physical abuse.   Patient sleeps 7-8 hours a night.   Patient drinks 2 cups of caffeine daily.    Patient is right handed.   Social Determinants of Health   Financial Resource Strain: Low Risk    Difficulty of Paying Living Expenses: Not hard at all  Food Insecurity: No Food Insecurity   Worried About Charity fundraiser in the Last Year: Never true   [place] in the Last Year: Never true  Transportation Needs: No Transportation Needs   Lack of Transportation (Medical): No   Lack of Transportation (Non-Medical): No  Physical Activity: Inactive   Days of Exercise per Week: 0 days   Minutes of Exercise per Session: 0 min  Stress: No Stress Concern Present   Feeling of Stress : Not at all  Social Connections: Socially Integrated   Frequency of Communication with Friends and Family: Twice a week   Frequency of Social Gatherings with Friends and Family: Three times a week   Attends Religious Services: 1 to 4 times per year   Active Member of Clubs or Organizations: Yes   Attends Music therapist: More than 4 times per year   Marital Status: Married  Human resources officer Violence: Not At Risk   Fear of Current or Ex-Partner: No   Emotionally Abused: No   Physically Abused: No   Sexually Abused: No   PHYSICAL EXAM  Vitals:   08/12/21 1045  BP: (!) 165/71  Pulse: 70  Weight: 177 lb (80.3 kg)  Height: 5\' 5"  (1.651 m)   Body mass index is 29.45 kg/m.  Generalized: Well developed, in no acute distress   Neurological examination  Mentation: Alert oriented to time, place, history taking. Follows all commands speech and language fluent Cranial nerve II-XII: Pupils were equal round reactive to light. Extraocular movements were full, visual field were full on confrontational test. Facial sensation and strength were normal. Head  turning and shoulder shrug  were normal and symmetric. Motor: The motor testing reveals 5 over 5 strength of all 4 extremities. Good symmetric motor tone is noted throughout.  Sensory: Sensory testing is intact to soft touch on all 4  extremities. No evidence of extinction is noted.  Coordination: Cerebellar testing reveals good finger-nose-finger and heel-to-shin bilaterally.  Gait and station: Gait is normal. Tandem gait is normal.  Reflexes: Deep tendon reflexes are symmetric and normal bilaterally.   DIAGNOSTIC DATA (LABS, IMAGING, TESTING) - I reviewed patient records, labs, notes, testing and imaging myself where available.  Lab Results  Component Value Date   WBC 6.0 03/23/2021   HGB 11.9 03/23/2021   HCT 36.0 03/23/2021   MCV 96 03/23/2021   PLT 138 (L) 03/23/2021      Component Value Date/Time   NA 139 04/02/2021 0939   NA 135 09/22/2020 1351   K 3.6 04/02/2021 0939   CL 100 04/02/2021 0939   CO2 27 04/02/2021 0939   GLUCOSE 193 (H) 04/02/2021 0939   BUN 21 04/02/2021 0939   BUN 17 09/22/2020 1351   CREATININE 1.20 (H) 05/13/2021 1445   CREATININE 1.19 (H) 07/04/2019 0911   CALCIUM 9.9 04/02/2021 0939   PROT 7.0 03/23/2021 1108   ALBUMIN 4.2 03/23/2021 1108   AST 26 03/23/2021 1108   ALT 22 03/23/2021 1108   ALKPHOS 58 03/23/2021 1108   BILITOT 0.4 03/23/2021 1108   GFRNONAA 67 09/22/2020 1351   GFRAA 77 09/22/2020 1351   Lab Results  Component Value Date   CHOL 160 07/09/2020   HDL 53.10 07/09/2020   LDLCALC 73 07/04/2019   LDLDIRECT 77.0 07/09/2020   TRIG 238.0 (H) 07/09/2020   CHOLHDL 3 07/09/2020   Lab Results  Component Value Date   HGBA1C 7.6 (A) 06/01/2021   HGBA1C 7.6 06/01/2021   HGBA1C 7.6 (A) 06/01/2021   HGBA1C 7.6 (A) 06/01/2021   Lab Results  Component Value Date   VITAMINB12 682 07/22/2015   Lab Results  Component Value Date   TSH 2.30 07/09/2020   ASSESSMENT AND PLAN 66 y.o. year old female  has a past medical history of Basal cell carcinoma, Brain bleed ([place]) (2017), COVID-19 (08/2019), DM (diabetes mellitus) ([place]), Former smoker, Glossitis, benign migratory (08/31/2019), Goiter, unspecified, History of colonic polyps (2012), Multiple sclerosis ([place]), Non-ST  elevated myocardial infarction (non-STEMI) ([place]), Olecranon bursitis of left elbow (10/17/2017), Oral mucositis (08/31/2019), Oral mucositis (08/31/2019), SCCA (squamous cell carcinoma) of skin, SEMI (subendocardial myocardial infarction) ([place]) (09/29/09), Shingles, Squamous cell carcinoma (2019), Traumatic rupture of volar plate of left ring finger (03/25/2020), Unspecified essential hypertension, and Unspecified hypothyroidism. here with:  1.  Multiple sclerosis 2.  Diarrhea on Aubagio  -Has remained off Aubagio since September 2022 -Her symptoms are improving (diarrhea, hyperglycemia, HTN, paresthesia), but still questions about faster elimination of Aubagio -I discussed with Dr. Felecia Shelling, can offer Cholestyramine 1 pack 3 times daily x 11 days, I called the patient and let her know, she will think about it and get back to me -For now, her MS has remained stable, she will remain off disease modifying agents -Will reduce gabapentin 300 mg twice daily, has been on long term not sure how much helping, can further reduce 1 daily, then stop  -She will follow with Dr .Felecia Shelling since Dr. Jannifer Franklin has retired, I will set up a 4 month follow-up   Evangeline Dakin, DNP 08/12/2021, 4:10 PM [facility] [address], [place], [name] [address] (  [phone] ° ° °

## 2021-08-12 NOTE — Patient Instructions (Signed)
Cut back gabapentin to 300 mg twice daily and see how pain control  I will talk with Dr. Felecia Shelling about Rogelia Rohrer  See you back in 4 months with Dr. Felecia Shelling

## 2021-08-20 ENCOUNTER — Ambulatory Visit (INDEPENDENT_AMBULATORY_CARE_PROVIDER_SITE_OTHER): Payer: Medicare Other | Admitting: Gastroenterology

## 2021-08-20 ENCOUNTER — Encounter: Payer: Self-pay | Admitting: Gastroenterology

## 2021-08-20 VITALS — BP 120/60 | HR 57 | Ht 66.0 in | Wt 173.2 lb

## 2021-08-20 DIAGNOSIS — Z86010 Personal history of colonic polyps: Secondary | ICD-10-CM | POA: Diagnosis not present

## 2021-08-20 DIAGNOSIS — K529 Noninfective gastroenteritis and colitis, unspecified: Secondary | ICD-10-CM

## 2021-08-20 NOTE — Patient Instructions (Addendum)
If you are age 68 or older, your body mass index should be between 23-30. Your Body mass index is 27.96 kg/m. If this is out of the aforementioned range listed, please consider follow up with your Primary Care Provider.  If you are age 69 or younger, your body mass index should be between 19-25. Your Body mass index is 27.96 kg/m. If this is out of the aformentioned range listed, please consider follow up with your Primary Care Provider.   ________________________________________________________  The [place] [facility] providers would like to encourage you to use [facility] to communicate with providers for non-urgent requests or questions.  Due to long hold times on the telephone, sending your provider a message by [facility] may be a faster and more efficient way to get a response.  Please allow 48 business hours for a response.  Please remember that this is for non-urgent requests.  _______________________________________________________  Daisy Bennett will be due for a recall colonoscopy 07-2026. We will send you a reminder in the mail when it gets closer to that time. ___  ____________________________________________________  Food Guidelines for those with chronic digestive trouble:  Many people have difficulty digesting certain foods, causing a variety of distressing and embarrassing symptoms such as abdominal pain, bloating and gas.  These foods may need to be avoided or consumed in small amounts.  Here are some tips that might be helpful for you.  1.   Lactose intolerance is the difficulty or complete inability to digest lactose, the natural sugar in milk and anything made from milk.  This condition is harmless, common, and can begin any time during life.  Some people can digest a modest amount of lactose while others cannot tolerate any.  Also, not all dairy products contain equal amounts of lactose.  For example, hard cheeses such as parmesan have less lactose than soft cheeses such as cheddar.  Yogurt has less  lactose than milk or cheese.  Many packaged foods (even many brands of bread) have milk, so read ingredient lists carefully.  It is difficult to test for lactose intolerance, so just try avoiding lactose as much as possible for a week and see what happens with your symptoms.  If you seem to be lactose intolerant, the best plan is to avoid it (but make sure you get calcium from another source).  The next best thing is to use lactase enzyme supplements, available over the counter everywhere.  Just know that many lactose intolerant people need to take several tablets with each serving of dairy to avoid symptoms.  Lastly, a lot of restaurant food is made with milk or butter.  Many are things you might not suspect, such as mashed potatoes, rice and pasta (cooked with butter) and "grilled" items.  If you are lactose intolerant, it never hurts to ask your server what has milk or butter.  2.   Fiber is an important part of your diet, but not all fiber is well-tolerated.  Insoluble fiber such as bran is often consumed by normal gut bacteria and converted into gas.  Soluble fiber such as oats, squash, carrots and green beans are typically tolerated better.  3.   Some types of carbohydrates can be poorly digested.  Examples include: fructose (apples, cherries, pears, raisins and other dried fruits), fructans (onions, zucchini, large amounts of wheat), sorbitol/mannitol/xylitol and sucralose/Splenda (common artificial sweeteners), and raffinose (lentils, broccoli, cabbage, asparagus, brussel sprouts, many types of beans).  Do a Development worker, community for [employer] and you will find helpful information.  Beano, a dietary supplement, will often help with raffinose-containing foods.  As with lactase tablets, you may need several per serving.  4.   Whenever possible, avoid processed food&meats and chemical additives.  High fructose corn syrup, a common sweetener, may be difficult to digest.  Eggs and soy (comes from the soybean, and  added to many foods now) are other common bloating/gassy foods.  5.  Regarding gluten:  gluten is a protein mainly found in wheat, but also rye and barley.  There is a condition called celiac sprue, which is an inflammatory reaction in the small intestine causing a variety of digestive symptoms.  Blood testing is highly reliable to look for this condition, and sometimes upper endoscopy with small bowel biopsies may be necessary to make the diagnosis.  Many patients who test negative for celiac sprue report improvement in their digestive symptoms when they switch to a gluten-free diet.  However, in these "non-celiac gluten sensitive" patients, the true role of gluten in their symptoms is unclear.  Reducing carbohydrates in general may decrease the gas and bloating caused when gut bacteria consume carbs. Also, some of these patients may actually be intolerant of the baker's yeast in bread products rather than the gluten.  Flatbread and other reduced yeast breads might therefore be tolerated.  There is no specific testing available for most food intolerances, which are discovered mainly by dietary elimination.  Please do not embark on a gluten free diet unless directed by your doctor, as it is highly restrictive, and may lead to nutritional deficiencies if not carefully monitored.  Lastly, beware of internet claims offering "personalized" tests for food intolerances.  Such testing has no reliable scientific evidence to support its reliability and correlation to symptoms.    6.  The best advice is old advice, especially for those with chronic digestive trouble - try to eat "clean".  Balanced diet, avoid processed food, plenty of fruits and vegetables, cut down the sugar, minimal alcohol, avoid tobacco. Make time to care for yourself, get enough sleep, exercise when you can, reduce stress.  Your guts will thank you for it.   - Dr. Herma Ard  Gastroenterology  ____________________________________________________________

## 2021-08-20 NOTE — Progress Notes (Signed)
[facility] Progress Note  Chief Complaint: Chronic diarrhea  Subjective  History: Daisy Bennett is seen in follow-up for chronic diarrhea after recent colonoscopy.  As noted in office consult, her diarrhea had significantly abated after stopping a new medicine for MS.  She was still having intermittent acute onset loose stool.  Fecal elastase checked prior to her visit with me was markedly decreased at 77, though I questioned if this may be falsely low from dilutional effect of watery stool.  Colonoscopy without visible cause, biopsies also negative for microscopic colitis.  Daisy Bennett is glad to report she has been feeling well overall.  She has loose stool about once a week and attributes it to certain things she eats, sometimes too much "roughage".  She denies abdominal pain, nausea or vomiting, her appetite is good and her weight stable.  She had spoken to her neurologist, who said the effects of her previous MS med might last for over a year after stopping it.  She also asked about my recent recommendation for a 10-year colonoscopy interval since she had previous polyps.  ROS: Cardiovascular:  no chest pain Respiratory: no dyspnea  The patient's Past Medical, Family and Social History were reviewed and are on file in the EMR.  Objective:  Med list reviewed  Current Outpatient Medications:    amLODipine (NORVASC) 10 MG tablet, TAKE 1 TABLET BY MOUTH EVERY DAY, Disp: 90 tablet, Rfl: 3   aspirin EC 81 MG tablet, Take 1 tablet (81 mg total) daily by mouth., Disp: , Rfl:    B Complex Vitamins (B COMPLEX-B12 PO), Take by mouth., Disp: , Rfl:    carvedilol (COREG) 12.5 MG tablet, Take 1 tablet (12.5 mg total) by mouth 2 (two) times daily., Disp: 180 tablet, Rfl: 1   cholecalciferol (VITAMIN D) 1000 units tablet, Take 1,000 Units by mouth daily., Disp: , Rfl:    fenofibrate 160 MG tablet, Take 1 tablet (160 mg total) by mouth daily., Disp: 90 tablet, Rfl: 3   Ferrous Sulfate (IRON) 325 (65  FE) MG TABS, Take 325 mg by mouth daily., Disp: , Rfl:    fish oil-omega-3 fatty acids 1000 MG capsule, Take 1 g by mouth daily., Disp: , Rfl:    gabapentin (NEURONTIN) 600 MG tablet, TAKE 1 TABLET BY MOUTH THREE TIMES A DAY, Disp: 270 tablet, Rfl: 0   glimepiride (AMARYL) 4 MG tablet, 1 tab BID, Disp: 180 tablet, Rfl: 1   hydrochlorothiazide (HYDRODIURIL) 50 MG tablet, Take 1 tablet (50 mg total) by mouth daily., Disp: 90 tablet, Rfl: 1   levothyroxine (SYNTHROID) 112 MCG tablet, Take 1 tablet (112 mcg total) by mouth daily., Disp: 90 tablet, Rfl: 3   losartan (COZAAR) 100 MG tablet, TAKE 1 TABLET BY MOUTH DAILY. PLEASE KEEP UPCOMING APPOINTMENT IN APRIL 2022 FOR FUTURE REFILLS. THANK YOU, Disp: 90 tablet, Rfl: 2   metFORMIN (GLUCOPHAGE) 1000 MG tablet, Take 1 tablet (1,000 mg total) by mouth daily with breakfast., Disp: 90 tablet, Rfl: 1   mirtazapine (REMERON) 15 MG tablet, Take 1 tablet (15 mg total) by mouth at bedtime., Disp: 90 tablet, Rfl: 1   nitroGLYCERIN (NITROSTAT) 0.4 MG SL tablet, Place 1 tablet (0.4 mg total) under the tongue every 5 (five) minutes x 3 doses as needed for chest pain., Disp: 25 tablet, Rfl: 4   pantoprazole (PROTONIX) 20 MG tablet, Take 1 tablet (20 mg total) by mouth daily., Disp: 90 tablet, Rfl: 1   potassium chloride (KLOR-CON) 10 MEQ tablet, Take  1 tablet (10 mEq total) by mouth daily., Disp: 90 tablet, Rfl: 1   prasugrel (EFFIENT) 10 MG TABS tablet, Take 1 tablet (10 mg total) by mouth daily., Disp: 90 tablet, Rfl: 3   rosuvastatin (CRESTOR) 10 MG tablet, Take 1 tablet (10 mg total) by mouth daily., Disp: 90 tablet, Rfl: 3   venlafaxine XR (EFFEXOR XR) 75 MG 24 hr capsule, Take 3 capsules (225 mg total) by mouth daily with breakfast., Disp: 270 capsule, Rfl: 1   Vital signs in last 24 hrs: Vitals:   08/20/21 1425  BP: 120/60  Pulse: (!) 57   Wt Readings from Last 3 Encounters:  08/20/21 173 lb 4 oz (78.6 kg)  08/12/21 177 lb (80.3 kg)  07/16/21 177 lb  (80.3 kg)    Physical Exam  Well-appearing HEENT: sclera anicteric, oral mucosa moist without lesions Neck: supple, no thyromegaly, JVD or lymphadenopathy Cardiac: RRR without murmurs, S1S2 heard, no peripheral edema Pulm: clear to auscultation bilaterally, normal RR and effort noted Abdomen: soft, no tenderness, with active bowel sounds. No guarding or palpable hepatosplenomegaly. Skin; warm and dry, no jaundice or rash  Labs:  Hgb A1c down to 7.6 from 8.6 in August ___________________________________________ Radiologic studies:   ____________________________________________ Other:   _____________________________________________ Assessment & Plan  Assessment: Encounter Diagnoses  Name Primary?   Chronic diarrhea Yes   History of colonic polyps    Her diarrhea has significantly abated since stopping the MS medication earlier in the year, she has no microscopic colitis and this sounds like it might have some dietary triggers as well.  I do not think she has exocrine pancreatic insufficiency, and suspect that her initial fecal elastase level was diluted either because of watery stool or urine, therefore giving a spurious result.  She does not have symptoms or risk factors for EPI.   Plan: No further testing or treatment at this point.  Written dietary advice given regarding common food triggers for diarrhea.  On further review of her colon polyp history, she had a tubular adenoma over 10 mm in diameter in 2020, then no adenomatous or serrated polyps on the colonoscopy last month.  Therefore current guidelines warrant a change of her recall to 5 years.  This was changed by staff in our recall database today.  She will come to see me as needed.  I encouraged her to contact us if her diarrhea is worsening, becomes oily/greasy or she has unintentional weight loss.  Should that occur, repeat fecal elastase would be warranted.  All questions and concerns answered to her  satisfaction.  32 minutes were spent on this encounter (including chart review, history/exam, counseling/coordination of care, and documentation) > 50% of that time was spent on counseling and coordination of care.   Nelida Meuse III

## 2021-08-24 ENCOUNTER — Other Ambulatory Visit: Payer: Self-pay | Admitting: Dermatology

## 2021-08-24 DIAGNOSIS — C44622 Squamous cell carcinoma of skin of right upper limb, including shoulder: Secondary | ICD-10-CM | POA: Diagnosis not present

## 2021-08-24 DIAGNOSIS — L570 Actinic keratosis: Secondary | ICD-10-CM | POA: Diagnosis not present

## 2021-08-24 DIAGNOSIS — D485 Neoplasm of uncertain behavior of skin: Secondary | ICD-10-CM | POA: Diagnosis not present

## 2021-08-24 DIAGNOSIS — Z23 Encounter for immunization: Secondary | ICD-10-CM | POA: Diagnosis not present

## 2021-08-25 LAB — DERMPATH, SPECIMEN A

## 2021-08-25 LAB — DERMATOPATHOLOGY REPORT

## 2021-08-27 DIAGNOSIS — M65842 Other synovitis and tenosynovitis, left hand: Secondary | ICD-10-CM | POA: Diagnosis not present

## 2021-08-27 DIAGNOSIS — M65832 Other synovitis and tenosynovitis, left forearm: Secondary | ICD-10-CM | POA: Diagnosis not present

## 2021-09-02 ENCOUNTER — Other Ambulatory Visit: Payer: Self-pay | Admitting: Family Medicine

## 2021-09-08 DIAGNOSIS — Z20822 Contact with and (suspected) exposure to covid-19: Secondary | ICD-10-CM | POA: Diagnosis not present

## 2021-09-16 ENCOUNTER — Telehealth: Payer: Self-pay

## 2021-09-16 ENCOUNTER — Other Ambulatory Visit: Payer: Self-pay | Admitting: Orthopedic Surgery

## 2021-09-16 DIAGNOSIS — M65832 Other synovitis and tenosynovitis, left forearm: Secondary | ICD-10-CM | POA: Diagnosis not present

## 2021-09-16 NOTE — Telephone Encounter (Signed)
° °  Pre-operative Risk Assessment    Patient Name: Daisy Bennett  DOB: 10/12/1955 MRN: [id number]      Request for Surgical Clearance    Procedure:   Left Wrist extensor tenosynovectomy  Date of Surgery:  Clearance 10/26/21                                 Surgeon:  Dr. Leanora Cover Surgeon's Group or Practice Name:  [facility] Phone number:  [phone] Fax number:  [phone] ATTN: Hassan Rowan   Type of Clearance Requested:   - Medical  - Pharmacy:  Hold Aspirin     Type of Anesthesia:   Choice   Additional requests/questions:   None  Signed, Bessye Stith   09/16/2021, 4:35 PM

## 2021-09-17 DIAGNOSIS — C44622 Squamous cell carcinoma of skin of right upper limb, including shoulder: Secondary | ICD-10-CM | POA: Diagnosis not present

## 2021-09-17 NOTE — Telephone Encounter (Signed)
° °  Name: Daisy Bennett  DOB: Apr 17, 1956  MRN: [id number]   Primary Cardiologist: Jenkins Rouge, MD  Chart reviewed as part of pre-operative protocol coverage. Patient was contacted 09/17/2021 in reference to pre-operative risk assessment for pending surgery as outlined below.  Daisy Bennett was last seen on 4/18/222 by Dr. Johnsie Cancel.  Since that day, Daisy Bennett has done well. She can complete more than 4.0 METS without angina.   Per Dr Johnsie Cancel in a prior note, OK to hold effient for 5 days prior to surgery. Since she has had no interval change in her cardiovascular health, it is reasonable to hold effient for 5 days prior to upcoming wrist surgery. Clearance requested ASA hold and no mention of effient. Given her CAD that has not been revascularized, we prefer to continue ASA throughout the perioperative period.   Therefore, based on ACC/AHA guidelines, the patient would be at acceptable risk for the planned procedure without further cardiovascular testing.   The patient was advised that if she develops new symptoms prior to surgery to contact our office to arrange for a follow-up visit, and she verbalized understanding.  I will route this recommendation to the requesting party via Epic fax function and remove from pre-op pool. Please call with questions.  Tami Lin Alie Hardgrove, PA 09/17/2021, 9:50 AM

## 2021-09-25 ENCOUNTER — Other Ambulatory Visit: Payer: Self-pay

## 2021-09-25 DIAGNOSIS — E118 Type 2 diabetes mellitus with unspecified complications: Secondary | ICD-10-CM

## 2021-09-28 ENCOUNTER — Ambulatory Visit (INDEPENDENT_AMBULATORY_CARE_PROVIDER_SITE_OTHER): Payer: Medicare Other | Admitting: Family Medicine

## 2021-09-28 ENCOUNTER — Encounter: Payer: Self-pay | Admitting: Family Medicine

## 2021-09-28 ENCOUNTER — Other Ambulatory Visit: Payer: Self-pay

## 2021-09-28 VITALS — BP 128/55 | HR 55 | Temp 98.0°F | Ht 66.0 in | Wt 174.0 lb

## 2021-09-28 DIAGNOSIS — E034 Atrophy of thyroid (acquired): Secondary | ICD-10-CM

## 2021-09-28 DIAGNOSIS — E663 Overweight: Secondary | ICD-10-CM | POA: Diagnosis not present

## 2021-09-28 DIAGNOSIS — E1169 Type 2 diabetes mellitus with other specified complication: Secondary | ICD-10-CM

## 2021-09-28 DIAGNOSIS — C44622 Squamous cell carcinoma of skin of right upper limb, including shoulder: Secondary | ICD-10-CM

## 2021-09-28 DIAGNOSIS — E782 Mixed hyperlipidemia: Secondary | ICD-10-CM

## 2021-09-28 DIAGNOSIS — E785 Hyperlipidemia, unspecified: Secondary | ICD-10-CM

## 2021-09-28 DIAGNOSIS — I10 Essential (primary) hypertension: Secondary | ICD-10-CM

## 2021-09-28 DIAGNOSIS — F330 Major depressive disorder, recurrent, mild: Secondary | ICD-10-CM | POA: Diagnosis not present

## 2021-09-28 DIAGNOSIS — N1832 Chronic kidney disease, stage 3b: Secondary | ICD-10-CM | POA: Diagnosis not present

## 2021-09-28 DIAGNOSIS — G35 Multiple sclerosis: Secondary | ICD-10-CM | POA: Diagnosis not present

## 2021-09-28 DIAGNOSIS — L03011 Cellulitis of right finger: Secondary | ICD-10-CM

## 2021-09-28 DIAGNOSIS — D6869 Other thrombophilia: Secondary | ICD-10-CM

## 2021-09-28 DIAGNOSIS — E118 Type 2 diabetes mellitus with unspecified complications: Secondary | ICD-10-CM

## 2021-09-28 DIAGNOSIS — I25118 Atherosclerotic heart disease of native coronary artery with other forms of angina pectoris: Secondary | ICD-10-CM | POA: Diagnosis not present

## 2021-09-28 HISTORY — DX: Squamous cell carcinoma of skin of right upper limb, including shoulder: C44.622

## 2021-09-28 LAB — LIPID PANEL
Cholesterol: 168 mg/dL (ref 0–200)
HDL: 45.8 mg/dL (ref >39.00)
NonHDL: 121.82
Total CHOL/HDL Ratio: 4
Triglycerides: 266 mg/dL — ABNORMAL HIGH (ref 0.0–149.0)
VLDL: 53.2 mg/dL — ABNORMAL HIGH (ref 0.0–40.0)

## 2021-09-28 LAB — BASIC METABOLIC PANEL
BUN: 20 mg/dL (ref 6–23)
CO2: 30 mEq/L (ref 19–32)
Calcium: 9.8 mg/dL (ref 8.4–10.5)
Chloride: 99 mEq/L (ref 96–112)
Creatinine, Ser: 1.39 mg/dL — ABNORMAL HIGH (ref 0.40–1.20)
GFR: 39.31 mL/min — ABNORMAL LOW (ref >60.00)
Glucose, Bld: 187 mg/dL — ABNORMAL HIGH (ref 70–99)
Potassium: 3.5 mEq/L (ref 3.5–5.1)
Sodium: 138 mEq/L (ref 135–145)

## 2021-09-28 LAB — CBC
HCT: 35.8 % — ABNORMAL LOW (ref 36.0–46.0)
Hemoglobin: 12 g/dL (ref 12.0–15.0)
MCHC: 33.5 g/dL (ref 30.0–36.0)
MCV: 96.8 fl (ref 78.0–100.0)
Platelets: 140 10*3/uL — ABNORMAL LOW (ref 150.0–400.0)
RBC: 3.7 Mil/uL — ABNORMAL LOW (ref 3.87–5.11)
RDW: 13.3 % (ref 11.5–15.5)
WBC: 8.5 10*3/uL (ref 4.0–10.5)

## 2021-09-28 LAB — HEMOGLOBIN A1C: Hgb A1c MFr Bld: 7.4 % — ABNORMAL HIGH (ref 4.6–6.5)

## 2021-09-28 LAB — LDL CHOLESTEROL, DIRECT: Direct LDL: 90 mg/dL

## 2021-09-28 MED ORDER — MIRTAZAPINE 15 MG PO TABS: 15.0000 mg | ORAL_TABLET | Freq: Every day | ORAL | 1 refills | Status: DC

## 2021-09-28 MED ORDER — HYDROCHLOROTHIAZIDE 50 MG PO TABS
50.0000 mg | ORAL_TABLET | Freq: Every day | ORAL | 1 refills | Status: DC
Start: 2021-09-28 — End: 2022-04-13

## 2021-09-28 MED ORDER — PANTOPRAZOLE SODIUM 20 MG PO TBEC: 20.0000 mg | DELAYED_RELEASE_TABLET | Freq: Every day | ORAL | 1 refills | Status: DC

## 2021-09-28 MED ORDER — FENOFIBRATE 160 MG PO TABS
160.0000 mg | ORAL_TABLET | Freq: Every day | ORAL | 3 refills | Status: DC
Start: 2021-09-28 — End: 2022-04-13

## 2021-09-28 MED ORDER — CEPHALEXIN 500 MG PO CAPS: 500.0000 mg | ORAL_CAPSULE | Freq: Two times a day (BID) | ORAL | 0 refills | Status: DC

## 2021-09-28 MED ORDER — GLIMEPIRIDE 4 MG PO TABS: ORAL_TABLET | ORAL | 1 refills | Status: DC

## 2021-09-28 MED ORDER — CARVEDILOL 12.5 MG PO TABS: 12.5000 mg | ORAL_TABLET | Freq: Two times a day (BID) | ORAL | 1 refills | Status: DC

## 2021-09-28 MED ORDER — POTASSIUM CHLORIDE ER 10 MEQ PO TBCR: 10.0000 meq | EXTENDED_RELEASE_TABLET | Freq: Every day | ORAL | 11 refills | Status: DC

## 2021-09-28 MED ORDER — VENLAFAXINE HCL ER 150 MG PO CP24: 150.0000 mg | ORAL_CAPSULE | Freq: Every day | ORAL | 1 refills | Status: DC

## 2021-09-28 MED ORDER — METFORMIN HCL 1000 MG PO TABS: 1000.0000 mg | ORAL_TABLET | Freq: Every day | ORAL | 1 refills | Status: DC

## 2021-09-28 NOTE — Patient Instructions (Addendum)
°  Great to see you today.  I have refilled the medication(s) we provide.   If labs were collected, we will inform you of lab results once received either by echart message or telephone call.   - echart message- for normal results that have been seen by the patient already.   - telephone call: abnormal results or if patient has not viewed results in their echart.'   We will call you with results. Next appt end of June

## 2021-09-28 NOTE — Progress Notes (Signed)
This visit occurred during the SARS-CoV-2 public health emergency.  Safety protocols were in place, including screening questions prior to the visit, additional usage of staff PPE, and extensive cleaning of exam room while observing appropriate contact time as indicated for disinfecting solutions.    Daisy Bennett , 09-Apr-1956, 66 y.o., female MRN: [id number] Patient Care Team    Relationship Specialty Notifications Start End  Ma Hillock, DO PCP - General Family Medicine  03/10/15   Josue Hector, MD PCP - Cardiology Cardiology Admissions 05/31/18   [place], Jennefer Bravo, MD Referring Physician Dermatology  02/10/16   Kathrynn Ducking, MD (Inactive) Consulting Physician Neurology  05/18/16   Linda Hedges, [place] Physician Obstetrics and Gynecology  06/04/16   Anell Barr, OD  Optometry  08/17/16     Chief Complaint  Patient presents with   Follow-up    [place]. Pt is fasting   Diabetes     Subjective: .Daisy Bennett is a 66 y.o. female present for [facility] follow up. Diabetes/morbid obesity: She reports compliance   with Amaryl  4 mg bid daily and metformin 1000 mg qd and she is tolerating them-increased last appt. Patient denies dizziness, hyperglycemic or hypoglycemic events. Patient denies numbness, tingling in the extremities or nonhealing wounds of feet.   Hypertension/hyperlipidemia: She reports compliance with her medication regimen of Crestor 10 mg daily, Effient 10 mg daily, losartan 100 mg daily, HCTZ 50 mg daily, fish oil 1000 mg daily, fenofibrate 160 mg daily Norvasc 10 mg, baby aspirin daily, Coreg 6.25 mg twice daily . H/O CAD, NSTEMI, post stent 2011 (OM)-collateralized RCA.  She follows with Dr. Johnsie Cancel. Patient denies chest pain, shortness of breath, dizziness or lower extremity edema.  Exercise: routine   depression/MS:  Patient reports compliance withEffexor at 150 mg daily and remeron 15 mg qhs.    Right hand swelling: Patient reports last week  and she is working in her [employer] and after which she noticed a thorn between her right index and middle finger at MCP.  Few days after she noted feeling "fever "in her index and middle finger joints.  Since that time she has had some mild swelling and redness remaining. She reports she did remove at least a piece of a thorn from this area.   Depression screen [facility] 2/9 05/27/2021 11/26/2020 07/09/2020 03/25/2020 07/04/2019  Decreased Interest 0 0 0 0 0  Down, Depressed, Hopeless 0 0 0 0 0  PHQ - 2 Score 0 0 0 0 0  Altered sleeping - - - - -  Tired, decreased energy - - - - -  Change in appetite - - - - -  Feeling bad or failure about yourself  - - - - -  Trouble concentrating - - - - -  Moving slowly or fidgety/restless - - - - -  Suicidal thoughts - - - - -  PHQ-9 Score - - - - -  Some recent data might be hidden    Allergies  Allergen Reactions   Aubagio [Teriflunomide] Diarrhea    Pancreatitis, neuropathy, elevated glucose   Betaseron [Interferon Beta-1b] Other (See Comments)    Increased LFT's   Provigil [Modafinil] Swelling    Tongue swelling and sores   Topamax [Topiramate]     Cognitive slowing   Doxycycline Other (See Comments)    Thrush,dizziness   Social History   Social History Narrative   The patient lives with husband. 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    Patient drinks occasionally, does not chew tobacco, does not use recreational drugs. She does drink occasional caffeine. She does wear her seatbelt. She does wear bike helmet riding a bike. She does exercise 3 times a week. She does not wear hearing aids or dentures. There is a smoke alarm in her home. There are no firearms in her home. She feels safe in her relationship. She has never experienced physical abuse.   Patient sleeps 7-8 hours a night.   Patient drinks 2 cups of caffeine daily.   Patient is right handed.   Past Medical History:  Diagnosis Date   Basal cell carcinoma     Brain bleed ([place]) 2017   COVID-19 08/2019   DM (diabetes mellitus) ([place])    Former smoker    Glossitis, benign migratory 08/31/2019   Goiter, unspecified    History of colonic polyps 2012   Multiple sclerosis (HCC)    Non-ST elevated myocardial infarction (non-STEMI) (HCC)    Olecranon bursitis of left elbow 10/17/2017   Oral mucositis 08/31/2019   Oral mucositis 08/31/2019   SCCA (squamous cell carcinoma) of skin    right shin   SEMI (subendocardial myocardial infarction) ([place]) 09/29/09   TOTAL RCA WITH STENT TO OM BRANCH   Shingles    Squamous cell carcinoma 2019   lower ext   Traumatic rupture of volar plate of left ring finger 03/25/2020   Unspecified essential hypertension    Unspecified hypothyroidism    Past Surgical History:  Procedure Laterality Date   CATARACT SURGERY  12/10   CESAREAN SECTION     CORONARY ANGIOPLASTY WITH STENT PLACEMENT  2011   KNEE SURGERY     leg surgery     squamous ccell removed    TONSILLECTOMY     VESICOVAGINAL FISTULA CLOSURE W/ TAH     Family History  Problem Relation Age of Onset   Hypertension Mother    Heart failure Father    Diabetes Father    Aneurysm Brother        THORACIC/ABD ANEURYSM   Prostate cancer Brother 35   Diabetes Brother    Fibromyalgia Sister    Brain cancer Maternal Grandmother 70   Multiple sclerosis Neg Hx    Colon cancer Neg Hx    Esophageal cancer Neg Hx    Allergies as of 09/28/2021       Reactions   Aubagio [teriflunomide] Diarrhea   Pancreatitis, neuropathy, elevated glucose   Betaseron [interferon Beta-1b] Other (See Comments)   Increased LFT's   Provigil [modafinil] Swelling   Tongue swelling and sores   Topamax [topiramate]    Cognitive slowing   Doxycycline Other (See Comments)   Thrush,dizziness        Medication List        Accurate as of September 28, 2021 10:12 AM. If you have any questions, ask your nurse or doctor.          STOP taking these medications    gabapentin 600 MG  tablet Commonly known as: NEURONTIN Stopped by: Howard Pouch, DO       TAKE these medications    amLODipine 10 MG tablet Commonly known as: NORVASC TAKE 1 TABLET BY MOUTH EVERY DAY   aspirin EC 81 MG tablet Take 1 tablet (81 mg total) daily by mouth.   B COMPLEX-B12 PO Take by mouth.   carvedilol 12.5 MG tablet Commonly known as: COREG Take 1 tablet (12.5 mg total)  by mouth 2 (two) times daily.   cholecalciferol 1000 units tablet Commonly known as: VITAMIN D Take 1,000 Units by mouth daily.   fenofibrate 160 MG tablet Take 1 tablet (160 mg total) by mouth daily.   fish oil-omega-3 fatty acids 1000 MG capsule Take 1 g by mouth daily.   glimepiride 4 MG tablet Commonly known as: AMARYL 1 tab BID   hydrochlorothiazide 50 MG tablet Commonly known as: HYDRODIURIL Take 1 tablet (50 mg total) by mouth daily.   Iron 325 (65 Fe) MG Tabs Take 325 mg by mouth daily.   levothyroxine 112 MCG tablet Commonly known as: SYNTHROID Take 1 tablet (112 mcg total) by mouth daily.   losartan 100 MG tablet Commonly known as: COZAAR TAKE 1 TABLET BY MOUTH DAILY. PLEASE KEEP UPCOMING APPOINTMENT IN APRIL 2022 FOR FUTURE REFILLS. THANK YOU   metFORMIN 1000 MG tablet Commonly known as: GLUCOPHAGE Take 1 tablet (1,000 mg total) by mouth daily with breakfast.   mirtazapine 15 MG tablet Commonly known as: Remeron Take 1 tablet (15 mg total) by mouth at bedtime.   nitroGLYCERIN 0.4 MG SL tablet Commonly known as: Nitrostat Place 1 tablet (0.4 mg total) under the tongue every 5 (five) minutes x 3 doses as needed for chest pain.   pantoprazole 20 MG tablet Commonly known as: PROTONIX Take 1 tablet (20 mg total) by mouth daily.   potassium chloride 10 MEQ tablet Commonly known as: KLOR-CON TAKE 1 TABLET BY MOUTH EVERY DAY   prasugrel 10 MG Tabs tablet Commonly known as: EFFIENT Take 1 tablet (10 mg total) by mouth daily.   rosuvastatin 10 MG tablet Commonly known as:  CRESTOR Take 1 tablet (10 mg total) by mouth daily.   venlafaxine XR 75 MG 24 hr capsule Commonly known as: Effexor XR Take 3 capsules (225 mg total) by mouth daily with breakfast. What changed: how much to take        All past medical history, surgical history, allergies, family history, immunizations andmedications were updated in the EMR today and reviewed under the history and medication portions of their EMR.     ROS: Negative, with the exception of above mentioned in HPI   Objective:  BP (!) 128/55    Pulse (!) 55    Temp 98 F (36.7 C) (Oral)    Ht 5\' 6"  (1.676 m)    Wt 174 lb (78.9 kg)    SpO2 100%    BMI 28.08 kg/m  Body mass index is 28.08 kg/m. Physical Exam Vitals and nursing note reviewed.  Constitutional:      General: She is not in acute distress.    Appearance: Normal appearance. She is not ill-appearing, toxic-appearing or diaphoretic.  HENT:     Head: Normocephalic and atraumatic.  Eyes:     General: No scleral icterus.       Right eye: No discharge.        Left eye: No discharge.     Extraocular Movements: Extraocular movements intact.     Conjunctiva/sclera: Conjunctivae normal.     Pupils: Pupils are equal, round, and reactive to light.  Cardiovascular:     Rate and Rhythm: Normal rate and regular rhythm.     Heart sounds: No murmur heard.   No friction rub. No gallop.  Pulmonary:     Effort: Pulmonary effort is normal. No respiratory distress.     Breath sounds: Normal breath sounds. No wheezing, rhonchi or rales.  Musculoskeletal:  Right hand: Swelling present.     Left hand: Normal.       Arms:     Cervical back: Neck supple. No tenderness.     Right lower leg: No edema.     Left lower leg: No edema.     Comments: Mild swelling with erythema present over MCP index and middle fingers.  Small punctate area,  believed to be from thorn present.  No drainage.  Lymphadenopathy:     Cervical: No cervical adenopathy.  Skin:    General: Skin  is warm and dry.     Coloration: Skin is not jaundiced or pale.     Findings: Erythema present. No rash.  Neurological:     Mental Status: She is alert and oriented to person, place, and time. Mental status is at baseline.     Motor: No weakness.     Gait: Gait normal.  Psychiatric:        Mood and Affect: Mood normal.        Behavior: Behavior normal.        Thought Content: Thought content normal.        Judgment: Judgment normal.    No results found. No results found. No results found for this or any previous visit (from the past 24 hour(s)).   Assessment/Plan: RHAELYN GIRON is a 66 y.o. female present for OV for [facility] Diabetes mellitus with complication (HCC)/Morbid obesity -Consider restarting WelChol - continue Amaryl to 4 mg BID - continue Metformin 1000 mg daily (decreased secondary to kidney function) - increase exercise regimen and continue dietary modification> cut back on sugary drinks. - tapered off gabapentin (w/ neuro assistance)  POCT HgB A1C--> 5.9> 6.1> 6.6> 8.6> 7.6 > collected today - prevnar 20 completed  -  foot exam 03/03/2021 - Eye exam: UTD 02/11/2021 UTD.  Dr. Venetia Constable, yearly eye exams - Microalbumin: collected today. - flu shot UTD  Hypertension/hyperlipidemia/Antiplatelet or antithrombotic long-term use/athersclerosis/hypokalemia/s/p stent placement:  Stable - follows with cardiology, Dr. Johnsie Cancel who prescribes medications.  - low salt, increase exercise.  -Continue amlodipine 10, losartan 100, -Continue coreg  12.5 mg BID  -Continue hctz 50 mg Qd -Continue Kdur 10 meq/d -Continue fenofibrate - continue  statin - continue  Effient and baby aspirin.  Cardiology feels she needs to continue both. - maintain routine cardiology follow ups.  - CBC, BMP, TSH and lipids collected today   Depression Stable -Continue Effexor 150 mg daily -She tapered off gabapentin with neuro assistance -Continue remeron 15 mg nightly  Multiple sclerosis  ([place]) Managed by neuro. -currently off all meds except gaba from neuro.  CKD 3 Avoid NSAIDs when able Renally dose meds when appropriate Vitamin D levels collected today BMP collected today  hypothyroid TSH collected today. Continue levo 112 mcg.   Cellulitis: Patient has area of redness and swelling consistent with cellulitis surrounding second and third MCP believed to be secondary to produce thorn.  There are no ulcerations surrounding this or drainage at this time.  No nodular lymphadenitis present. We will start treatment with Keflex twice daily x7 days and follow closely.  Next appt 4  mos for cmc  Reviewed expectations re: course of current medical issues. Discussed self-management of symptoms. Outlined signs and symptoms indicating need for more acute intervention. Patient verbalized understanding and all questions were answered. Patient received an After-Visit Summary.    Orders Placed This Encounter  Procedures   Basic Metabolic Panel (BMET)   Hemoglobin A1c   TSH  CBC   Lipid panel   Vitamin D (25 hydroxy)   Microalbumin / creatinine urine ratio    No orders of the defined types were placed in this encounter.    Referral Orders  No referral(s) requested today   > 45 minutes was dedicated to this patient's encounter to include managing multiple chronic serious conditions with laboratory evaluation, medications and new acute concern.     Note is dictated utilizing voice recognition software. Although note has been proof read prior to signing, occasional typographical errors still can be missed. If any questions arise, please do not hesitate to call for verification.   electronically signed by:  Howard Pouch, DO  [place]

## 2021-09-29 ENCOUNTER — Telehealth: Payer: Self-pay | Admitting: Family Medicine

## 2021-09-29 LAB — MICROALBUMIN / CREATININE URINE RATIO
Creatinine,U: 84 mg/dL
Microalb Creat Ratio: 10.9 mg/g (ref 0.0–30.0)
Microalb, Ur: 9.2 mg/dL — ABNORMAL HIGH (ref 0.0–1.9)

## 2021-09-29 LAB — TSH: TSH: 0.58 u[IU]/mL (ref 0.35–5.50)

## 2021-09-29 LAB — VITAMIN D 25 HYDROXY (VIT D DEFICIENCY, FRACTURES): VITD: 43.76 ng/mL (ref 30.00–100.00)

## 2021-09-29 MED ORDER — LEVOTHYROXINE SODIUM 112 MCG PO TABS: 112.0000 ug | ORAL_TABLET | Freq: Every day | ORAL | 3 refills | Status: DC

## 2021-09-29 MED ORDER — ROSUVASTATIN CALCIUM 20 MG PO TABS: 20.0000 mg | ORAL_TABLET | Freq: Every day | ORAL | 3 refills | Status: DC

## 2021-09-29 NOTE — Telephone Encounter (Signed)
Please call patient: She is mildly dehydrated by her labs and needs to increase her fluid consumption.  Her kidney function mildly decreased from her prior increase hydration Vitamin D levels look normal. Overall cholesterol panel is okay although not quite at goal.  I have called in an increased dose of her Crestor to take before bed.  This should bring down her LDL/bad cholesterol goal of 70. Blood cell counts and electrolytes are normal/stable Thyroid levels are stable-I have called in her refills. A1c is mildly improved at 7.4, was 7.6 last time.  If still above 7 at next visit, would consider adding on a another medication for better control.  Hopefully with summer coming she becomes more active and this number goes back down.

## 2021-09-30 HISTORY — PX: HAND SURGERY: SHX662

## 2021-09-30 NOTE — Telephone Encounter (Signed)
Spoke with pt regarding labs and instructions.   

## 2021-10-02 LAB — RESULTS CONSOLE HPV: CHL HPV: NEGATIVE

## 2021-10-08 ENCOUNTER — Encounter: Payer: Self-pay | Admitting: Family Medicine

## 2021-10-08 NOTE — Telephone Encounter (Signed)
Please advise 

## 2021-10-12 ENCOUNTER — Ambulatory Visit: Payer: Medicare Other | Admitting: Neurology

## 2021-10-15 ENCOUNTER — Encounter (HOSPITAL_BASED_OUTPATIENT_CLINIC_OR_DEPARTMENT_OTHER): Payer: Self-pay | Admitting: Orthopedic Surgery

## 2021-10-15 ENCOUNTER — Other Ambulatory Visit: Payer: Self-pay

## 2021-10-15 DIAGNOSIS — Z20822 Contact with and (suspected) exposure to covid-19: Secondary | ICD-10-CM | POA: Diagnosis not present

## 2021-10-16 ENCOUNTER — Telehealth (INDEPENDENT_AMBULATORY_CARE_PROVIDER_SITE_OTHER): Payer: Medicare Other | Admitting: Family Medicine

## 2021-10-16 ENCOUNTER — Encounter: Payer: Self-pay | Admitting: Family Medicine

## 2021-10-16 DIAGNOSIS — M79641 Pain in right hand: Secondary | ICD-10-CM

## 2021-10-16 DIAGNOSIS — M7989 Other specified soft tissue disorders: Secondary | ICD-10-CM | POA: Diagnosis not present

## 2021-10-16 MED ORDER — AMOXICILLIN-POT CLAVULANATE 875-125 MG PO TABS: 1.0000 | ORAL_TABLET | Freq: Two times a day (BID) | ORAL | 0 refills | Status: DC

## 2021-10-16 NOTE — Progress Notes (Signed)
VIRTUAL VISIT VIA VIDEO  I connected with Daisy Bennett on 10/16/21 at  1:00 PM EDT by a video enabled telemedicine application and verified that I am speaking with the correct person using two identifiers. Location patient: Home Location provider: [facility], Office Persons participating in the virtual visit: Patient, Dr. Raoul Pitch and Cyndra Numbers, CMA  I discussed the limitations of evaluation and management by telemedicine and the availability of in person appointments. The patient expressed understanding and agreed to proceed.    Daisy Bennett , 1955/11/19, 66 y.o., female MRN: [id number] Patient Care Team    Relationship Specialty Notifications Start End  Ma Hillock, DO PCP - General Family Medicine  03/10/15   Josue Hector, MD PCP - Cardiology Cardiology Admissions 05/31/18   [place], Jennefer Bravo, MD Referring Physician Dermatology  02/10/16   Kathrynn Ducking, MD (Inactive) Consulting Physician Neurology  05/18/16   Linda Hedges, DO Consulting Physician Obstetrics and Gynecology  06/04/16   Anell Barr, OD  Optometry  08/17/16     Chief Complaint  Patient presents with   Joint Swelling    Pt c/o R joint/hand swelling x 1 mo;      Subjective: Daisy Bennett is a 66 y.o. female present for   Right hand swelling:pt reports the redness seemed to fade with the keflex BID, but the swelling and warmth remained. It is still "burning" in the joint. No fever or chills. Skin intact, no drainage. No rash or other skin changes.   Prior note:  Patient reports last week and she is working in her [employer] and after which she noticed a thorn between her right index and middle finger at MCP.  Few days after she noted feeling "fever "in her index and middle finger joints.  Since that time she has had some mild swelling and redness remaining. She reports she did remove at least a piece of a thorn from this area.  Depression screen [facility] 2/9 09/28/2021 05/27/2021  11/26/2020 07/09/2020 03/25/2020  Decreased Interest 0 0 0 0 0  Down, Depressed, Hopeless 0 0 0 0 0  PHQ - 2 Score 0 0 0 0 0  Altered sleeping 1 - - - -  Tired, decreased energy 0 - - - -  Change in appetite 0 - - - -  Feeling bad or failure about yourself  0 - - - -  Trouble concentrating 0 - - - -  Moving slowly or fidgety/restless 0 - - - -  Suicidal thoughts 0 - - - -  PHQ-9 Score 1 - - - -  Some recent data might be hidden    Allergies  Allergen Reactions   Aubagio [Teriflunomide] Diarrhea    Pancreatitis, neuropathy, elevated glucose   Betaseron [Interferon Beta-1b] Other (See Comments)    Increased LFT's   Provigil [Modafinil] Swelling    Tongue swelling and sores   Topamax [Topiramate]     Cognitive slowing   Doxycycline Other (See Comments)    Thrush,dizziness   Social History   Social History Narrative   The patient lives with husband. 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    Patient drinks occasionally, does not chew tobacco, does not use recreational drugs. She does drink occasional caffeine. She does wear her seatbelt. She does wear bike helmet riding a bike. She does exercise 3 times a week. She does not wear hearing aids or dentures. There is  a smoke alarm in her home. There are no firearms in her home. She feels safe in her relationship. She has never experienced physical abuse.   Patient sleeps 7-8 hours a night.   Patient drinks 2 cups of caffeine daily.   Patient is right handed.   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   Goiter, unspecified    Heart murmur    History of colonic polyps 2012   Multiple sclerosis (HCC)    Non-ST elevated myocardial infarction (non-STEMI) (HCC)    Olecranon bursitis of left elbow 10/17/2017   Oral mucositis 08/31/2019   SCCA (squamous cell  carcinoma) of skin    right shin   SEMI (subendocardial myocardial infarction) ([place]) 09/29/2009   TOTAL RCA WITH STENT TO OM BRANCH   Shingles    Squamous cell carcinoma 2019   lower ext   Traumatic rupture of volar plate of left ring finger 03/25/2020   Unspecified essential hypertension    Unspecified hypothyroidism    Past Surgical History:  Procedure Laterality Date   CATARACT SURGERY  12/10   CESAREAN SECTION     CORONARY ANGIOPLASTY WITH STENT PLACEMENT  2011   KNEE SURGERY     leg surgery     squamous ccell removed    TONSILLECTOMY     VESICOVAGINAL FISTULA CLOSURE W/ TAH     Family History  Problem Relation Age of Onset   Hypertension Mother    Heart failure Father    Diabetes Father    Aneurysm Brother        THORACIC/ABD ANEURYSM   Prostate cancer Brother 67   Diabetes Brother    Fibromyalgia Sister    Brain cancer Maternal Grandmother 78   Multiple sclerosis Neg Hx    Colon cancer Neg Hx    Esophageal cancer Neg Hx    Allergies as of 10/16/2021       Reactions   Aubagio [teriflunomide] Diarrhea   Pancreatitis, neuropathy, elevated glucose   Betaseron [interferon Beta-1b] Other (See Comments)   Increased LFT's   Provigil [modafinil] Swelling   Tongue swelling and sores   Topamax [topiramate]    Cognitive slowing   Doxycycline Other (See Comments)   Thrush,dizziness        Medication List        Accurate as of October 16, 2021  1:38 PM. If you have any questions, ask your nurse or doctor.          STOP taking these medications    cephALEXin 500 MG capsule Commonly known as: Keflex Stopped by: Howard Pouch, DO       TAKE these medications    amLODipine 10 MG tablet Commonly known as: NORVASC TAKE 1 TABLET BY MOUTH EVERY DAY   amoxicillin-clavulanate 875-125 MG tablet Commonly known as: AUGMENTIN Take 1 tablet by mouth 2 (two) times daily. Started by: Howard Pouch, DO   aspirin EC 81 MG tablet Take 1 tablet (81 mg total) daily  by mouth.   B COMPLEX-B12 PO Take by mouth.   carvedilol 12.5 MG tablet Commonly known as: COREG Take 1 tablet (12.5 mg total) by mouth 2 (two) times daily.   cholecalciferol 1000 units tablet Commonly known as: VITAMIN D Take 1,000 Units by mouth daily.   fenofibrate 160 MG tablet Take 1  tablet (160 mg total) by mouth daily.   fish oil-omega-3 fatty acids 1000 MG capsule Take 1 g by mouth daily.   glimepiride 4 MG tablet Commonly known as: AMARYL 1 tab BID   hydrochlorothiazide 50 MG tablet Commonly known as: HYDRODIURIL Take 1 tablet (50 mg total) by mouth daily.   Iron 325 (65 Fe) MG Tabs Take 325 mg by mouth daily.   levothyroxine 112 MCG tablet Commonly known as: SYNTHROID Take 1 tablet (112 mcg total) by mouth daily.   losartan 100 MG tablet Commonly known as: COZAAR TAKE 1 TABLET BY MOUTH DAILY. PLEASE KEEP UPCOMING APPOINTMENT IN APRIL 2022 FOR FUTURE REFILLS. THANK YOU   metFORMIN 1000 MG tablet Commonly known as: GLUCOPHAGE Take 1 tablet (1,000 mg total) by mouth daily with breakfast.   mirtazapine 15 MG tablet Commonly known as: Remeron Take 1 tablet (15 mg total) by mouth at bedtime.   nitroGLYCERIN 0.4 MG SL tablet Commonly known as: Nitrostat Place 1 tablet (0.4 mg total) under the tongue every 5 (five) minutes x 3 doses as needed for chest pain.   pantoprazole 20 MG tablet Commonly known as: PROTONIX Take 1 tablet (20 mg total) by mouth daily.   potassium chloride 10 MEQ tablet Commonly known as: KLOR-CON Take 1 tablet (10 mEq total) by mouth daily.   prasugrel 10 MG Tabs tablet Commonly known as: EFFIENT Take 1 tablet (10 mg total) by mouth daily.   rosuvastatin 20 MG tablet Commonly known as: CRESTOR Take 1 tablet (20 mg total) by mouth daily.   venlafaxine XR 150 MG 24 hr capsule Commonly known as: Effexor XR Take 1 capsule (150 mg total) by mouth daily with breakfast.        All past medical history, surgical history,  allergies, family history, immunizations andmedications were updated in the EMR today and reviewed under the history and medication portions of their EMR.     ROS Negative, with the exception of above mentioned in HPI   Objective:  There were no vitals taken for this visit. There is no height or weight on file to calculate BMI. Physical Exam Gen: Afebrile. No acute distress. Nontoxic in appearance, well developed, well nourished.  HENT: AT. [place].  Skin: no rashes, purpura or petechiae. Intact. No erythema. Swelling increased right 2nd/3rd mcp. tender Neuro: PERLA. EOMi. Alert. Oriented x3  Psych: Normal affect, dress and demeanor. Normal speech. Normal thought content and judgment.  No results found. No results found. No results found for this or any previous visit (from the past 24 hour(s)).  Assessment/Plan: Daisy Bennett is a 66 y.o. female present for OV for  Hand swelling/pain: Worsening swelling - erythema resolved.  No red flags on HPI/exam, but she has upcoming surgery and needs to make sure this is not infection.  Xray ordered today.  No nodular lymphadenitis present. Augmentin prescribed.  Will ensure not infectious and can consider steroid after surgery if inflammatory.   Reviewed expectations re: course of current medical issues. Discussed self-management of symptoms. Outlined signs and symptoms indicating need for more acute intervention. Patient verbalized understanding and all questions were answered. Patient received an After-Visit Summary.    Orders Placed This Encounter  Procedures   DG Hand Complete Right   Meds ordered this encounter  Medications   amoxicillin-clavulanate (AUGMENTIN) 875-125 MG tablet    Sig: Take 1 tablet by mouth 2 (two) times daily.    Dispense:  20 tablet    Refill:  0  Referral Orders  No referral(s) requested today     Note is dictated utilizing voice recognition software. Although note has been proof read prior to  signing, occasional typographical errors still can be missed. If any questions arise, please do not hesitate to call for verification.   electronically signed by:  Howard Pouch, DO  [place]

## 2021-10-17 ENCOUNTER — Other Ambulatory Visit: Payer: Self-pay

## 2021-10-17 ENCOUNTER — Telehealth: Payer: Self-pay | Admitting: Family Medicine

## 2021-10-17 ENCOUNTER — Encounter (HOSPITAL_BASED_OUTPATIENT_CLINIC_OR_DEPARTMENT_OTHER): Payer: Self-pay | Admitting: Pediatrics

## 2021-10-17 ENCOUNTER — Emergency Department (HOSPITAL_BASED_OUTPATIENT_CLINIC_OR_DEPARTMENT_OTHER)
Admission: EM | Admit: 2021-10-17 | Discharge: 2021-10-17 | Disposition: A | Discharge status: Home / Self Care | Payer: Medicare Other | Attending: Emergency Medicine | Admitting: Emergency Medicine

## 2021-10-17 ENCOUNTER — Emergency Department (HOSPITAL_BASED_OUTPATIENT_CLINIC_OR_DEPARTMENT_OTHER): Payer: Medicare Other | Admitting: Radiology

## 2021-10-17 DIAGNOSIS — Z85828 Personal history of other malignant neoplasm of skin: Secondary | ICD-10-CM | POA: Insufficient documentation

## 2021-10-17 DIAGNOSIS — E039 Hypothyroidism, unspecified: Secondary | ICD-10-CM | POA: Diagnosis not present

## 2021-10-17 DIAGNOSIS — Z8616 Personal history of COVID-19: Secondary | ICD-10-CM | POA: Insufficient documentation

## 2021-10-17 DIAGNOSIS — E119 Type 2 diabetes mellitus without complications: Secondary | ICD-10-CM | POA: Diagnosis not present

## 2021-10-17 DIAGNOSIS — Z7982 Long term (current) use of aspirin: Secondary | ICD-10-CM | POA: Insufficient documentation

## 2021-10-17 DIAGNOSIS — I10 Essential (primary) hypertension: Secondary | ICD-10-CM | POA: Diagnosis not present

## 2021-10-17 DIAGNOSIS — W228XXA Striking against or struck by other objects, initial encounter: Secondary | ICD-10-CM | POA: Insufficient documentation

## 2021-10-17 DIAGNOSIS — Z86010 Personal history of colonic polyps: Secondary | ICD-10-CM | POA: Diagnosis not present

## 2021-10-17 DIAGNOSIS — Z79899 Other long term (current) drug therapy: Secondary | ICD-10-CM | POA: Diagnosis not present

## 2021-10-17 DIAGNOSIS — M79641 Pain in right hand: Secondary | ICD-10-CM | POA: Diagnosis not present

## 2021-10-17 DIAGNOSIS — Z7984 Long term (current) use of oral hypoglycemic drugs: Secondary | ICD-10-CM | POA: Diagnosis not present

## 2021-10-17 DIAGNOSIS — M25541 Pain in joints of right hand: Secondary | ICD-10-CM | POA: Diagnosis not present

## 2021-10-17 DIAGNOSIS — R2231 Localized swelling, mass and lump, right upper limb: Secondary | ICD-10-CM

## 2021-10-17 DIAGNOSIS — Z87891 Personal history of nicotine dependence: Secondary | ICD-10-CM | POA: Diagnosis not present

## 2021-10-17 DIAGNOSIS — M7989 Other specified soft tissue disorders: Secondary | ICD-10-CM | POA: Diagnosis not present

## 2021-10-17 NOTE — Telephone Encounter (Signed)
Dr Pearline Cables from [place] [facility] is calling to let us know that he saw pt today for right hand edema,erythema,and pain. Apparently she was told to have X ray done and somehow ended up in the ED. ? ?Symptoms have been recurrent for a month and started after right hand rose thorn injury while gardening. ?She has mild erythema and edema of right 2nd-3rd MCP joint,area of injury, no ulcerations or RUE adenopathies on examination today.. ? ?She has been treated with Cephalexin and was started on Augmentin yesterday. ?She is hemodynamically stable. ?Right hand X ray negative for signs of osteomyelitis. ?May need culture or further studies if problem does not resolve. ? ?It seems like she is planning on left wrist surgery on 10/26/21, not sure if this procedure may need to be postponed due to active soft tissue infection of right hand. ? ?Message will be route to Dr Raoul Pitch. ?Johntavius Shepard [place], MD ? ? ? ? ? ?

## 2021-10-17 NOTE — ED Provider Notes (Signed)
[name] EMERGENCY DEPT Provider Note   CSN: [id number] Arrival date & time: 10/17/21  1740     History  Chief Complaint  Patient presents with   Hand Injury    Daisy Bennett is a 66 y.o. female.  This is a 66 y.o. female with significant medical history as below, including diabetes, MS, who presents to the ED with complaint of right hand swelling. This began after gardening with roses, she had a rose thorn lodged in her knuckle at site of discomfort/swelling. She was started on oral abx by PCP, symptoms did initially improve but pain has returned after completion of abx. Initially erythema did improve. No systemic complaints. No proximal swelling of her RUE. No numbness or tinging. She was started on augmentin given ongoing complaints. Was sent by PCP for XR.     Past Medical History: No date: Basal cell carcinoma 2017: Brain bleed ([place]) 08/2019: COVID-19 No date: DM (diabetes mellitus) ([place]) No date: Former smoker 08/31/2019: Glossitis, benign migratory No date: Goiter, unspecified No date: Heart murmur 2012: History of colonic polyps No date: Multiple sclerosis (HCC) No date: Non-ST elevated myocardial infarction (non-STEMI) ([place]) 10/17/2017: Olecranon bursitis of left elbow 08/31/2019: Oral mucositis No date: SCCA (squamous cell carcinoma) of skin     Comment:  right shin 09/29/2009: SEMI (subendocardial myocardial infarction) ([place])     Comment:  TOTAL RCA WITH STENT TO OM BRANCH No date: Shingles 2019: Squamous cell carcinoma     Comment:  lower ext 03/25/2020: Traumatic rupture of volar plate of left ring finger No date: Unspecified essential hypertension No date: Unspecified hypothyroidism  Past Surgical History: 12/10: CATARACT SURGERY No date: CESAREAN SECTION 2011: CORONARY ANGIOPLASTY WITH STENT PLACEMENT No date: KNEE SURGERY No date: leg surgery     Comment:  squamous ccell removed  No date: TONSILLECTOMY No date: VESICOVAGINAL  FISTULA CLOSURE W/ TAH    The history is provided by the patient. No language interpreter was used.  Hand Injury     Home Medications Prior to Admission medications   Medication Sig Start Date End Date Taking? Authorizing Provider  amLODipine (NORVASC) 10 MG tablet TAKE 1 TABLET BY MOUTH EVERY DAY 01/21/21   Josue Hector, MD  amoxicillin-clavulanate (AUGMENTIN) 875-125 MG tablet Take 1 tablet by mouth 2 (two) times daily. 10/16/21   Kuneff, Renee A, DO  aspirin EC 81 MG tablet Take 1 tablet (81 mg total) daily by mouth. 06/09/17   Josue Hector, MD  B Complex Vitamins (B COMPLEX-B12 PO) Take by mouth.    [provider]  carvedilol (COREG) 12.5 MG tablet Take 1 tablet (12.5 mg total) by mouth 2 (two) times daily. 09/28/21   Kuneff, Renee A, DO  cholecalciferol (VITAMIN D) 1000 units tablet Take 1,000 Units by mouth daily.    [provider]  fenofibrate 160 MG tablet Take 1 tablet (160 mg total) by mouth daily. 09/28/21   Kuneff, Renee A, DO  Ferrous Sulfate (IRON) 325 (65 FE) MG TABS Take 325 mg by mouth daily.    [provider]  fish oil-omega-3 fatty acids 1000 MG capsule Take 1 g by mouth daily.    [provider]  glimepiride (AMARYL) 4 MG tablet 1 tab BID 09/28/21   Kuneff, Renee A, DO  hydrochlorothiazide (HYDRODIURIL) 50 MG tablet Take 1 tablet (50 mg total) by mouth daily. 09/28/21   Kuneff, Renee A, DO  levothyroxine (SYNTHROID) 112 MCG tablet Take 1 tablet (112 mcg total) by mouth  daily. 09/29/21   Kuneff, Renee A, DO  losartan (COZAAR) 100 MG tablet TAKE 1 TABLET BY MOUTH DAILY. PLEASE KEEP UPCOMING APPOINTMENT IN APRIL 2022 FOR FUTURE REFILLS. [place] YOU 02/23/21   Josue Hector, MD  metFORMIN (GLUCOPHAGE) 1000 MG tablet Take 1 tablet (1,000 mg total) by mouth daily with breakfast. 09/28/21   Kuneff, Renee A, DO  mirtazapine (REMERON) 15 MG tablet Take 1 tablet (15 mg total) by mouth at bedtime. 09/28/21   Kuneff, Renee A, DO  nitroGLYCERIN  (NITROSTAT) 0.4 MG SL tablet Place 1 tablet (0.4 mg total) under the tongue every 5 (five) minutes x 3 doses as needed for chest pain. 11/17/20 07/19/22  Josue Hector, MD  pantoprazole (PROTONIX) 20 MG tablet Take 1 tablet (20 mg total) by mouth daily. 09/28/21   Kuneff, Renee A, DO  potassium chloride (KLOR-CON) 10 MEQ tablet Take 1 tablet (10 mEq total) by mouth daily. 09/28/21   Kuneff, Renee A, DO  prasugrel (EFFIENT) 10 MG TABS tablet Take 1 tablet (10 mg total) by mouth daily. 12/09/20   Josue Hector, MD  rosuvastatin (CRESTOR) 20 MG tablet Take 1 tablet (20 mg total) by mouth daily. 09/29/21   Kuneff, Renee A, DO  venlafaxine XR (EFFEXOR XR) 150 MG 24 hr capsule Take 1 capsule (150 mg total) by mouth daily with breakfast. 09/28/21   Raoul Pitch, Renee A, DO      Allergies    Aubagio [teriflunomide], Betaseron [interferon beta-1b], Provigil [modafinil], Topamax [topiramate], and Doxycycline    Review of Systems   Review of Systems  Musculoskeletal:  Positive for arthralgias.       Knuckle swelling 2nd 3rd   Physical Exam Updated Vital Signs BP (!) 180/80 (BP Location: Left Arm)   Pulse 70   Temp 97.9 F (36.6 C)   Resp 16   Ht '5\' 6"'$  (1.676 m)   Wt 77.1 kg   SpO2 100%   BMI 27.44 kg/m  Physical Exam Vitals and nursing note reviewed.  Constitutional:      General: She is not in acute distress.    Appearance: Normal appearance. She is not ill-appearing, toxic-appearing or diaphoretic.  HENT:     Head: Normocephalic and atraumatic.     Right Ear: External ear normal.     Left Ear: External ear normal.     Nose: Nose normal.     Mouth/Throat:     Mouth: Mucous membranes are moist.  Eyes:     General: No scleral icterus.       Right eye: No discharge.        Left eye: No discharge.  Cardiovascular:     Rate and Rhythm: Normal rate and regular rhythm.     Pulses: Normal pulses.     Heart sounds: Normal heart sounds.  Pulmonary:     Effort: Pulmonary effort is normal. No  respiratory distress.     Breath sounds: Normal breath sounds.  Abdominal:     General: Abdomen is flat.     Tenderness: There is no abdominal tenderness.  Musculoskeletal:        General: Normal range of motion.       Hands:     Cervical back: Normal range of motion.     Right lower leg: No edema.     Left lower leg: No edema.     Comments: No proximal lymphadenopathy, ulceration or swelling, no streaking.  Full range of motion to shoulder and elbow and wrist on  ipsilateral side.  No lymphocutaneous abnormalities on exam  Skin:    General: Skin is warm and dry.     Capillary Refill: Capillary refill takes less than 2 seconds.  Neurological:     Mental Status: She is alert.  Psychiatric:        Mood and Affect: Mood normal.        Behavior: Behavior normal.    ED Results / Procedures / Treatments   Labs (all labs ordered are listed, but only abnormal results are displayed) Labs Reviewed - No data to display  EKG None  Radiology DG Hand Complete Right  Result Date: 10/17/2021 CLINICAL DATA:  Pain and swelling. Patient had a thorn stuck in finger 1 month ago between the second third digits. Now with redness and swelling after 1 round of antibiotics. EXAM: RIGHT HAND - COMPLETE 3+ VIEW COMPARISON:  Right wrist 02/21/2020. FINDINGS: There is no evidence of fracture or dislocation. No focal bone erosions to suggest osteomyelitis. Scattered, mild DIP joint osteoarthritis. Soft tissues are unremarkable.No radio-opaque foreign bodies. IMPRESSION: 1. No acute findings. 2. No evidence for osteomyelitis. Electronically Signed   By: Kerby Moors M.D.   On: 10/17/2021 10:18    Procedures Procedures    Medications Ordered in ED Medications - No data to display  ED Course/ Medical Decision Making/ A&P                           Medical Decision Making Amount and/or Complexity of Data Reviewed Radiology: ordered.   Initial Impression and Ddx This patient presents to the Emergency  Department for the above complaint. This involves an extensive number of treatment options and is a complaint that carries with it a high risk of complications and morbidity. Vital signs were reviewed.   Serious etiologies considered. Ddx includes but is not limited to: sporotrichosis, cellulitis, arthritis, msk injury  Patient PMH that increases complexity of ED encounter:  n/a  Previous records obtained and reviewed   Additional history obtained from PCP   Interpretation of Diagnostics Labs & imaging results that were available during my care of the patient were visualized by me and considered in my medical decision making.   I ordered imaging studies which included right hand XR and I visualized the imaging and I agree with radiologist interpretation. No acute process, no visualized FB  Social determinants of health include -  N/a  Personally discussed patient care with consultant; PCP    Patient Reassessment and Ultimate Disposition/Management  Patient management required discussion with the following services or consulting groups:  pcp       Patient with ongoing pain and swelling to her goals.  She is completed 1 round of antibiotics.  She was started on Augmentin.  No systemic evidence of infection.  Afebrile, not tachycardic.  She has mild discomfort to the swollen region, not taking medications to help with her discomfort at home.   Possibility of sporotrichosis, she has no significant cutaneous manifestations of this process.  No ulceration, no lymphadenopathy or lymphocutaneous abnormalities to the affected arm.  My suspicion for fungal infection is fairly low.  There is no ulceration. F/u with PCP.   D/w primary care office, will have patient follow-up with PCP.  Continue Augmentin.  Return to ER if worse.   The patient improved significantly and was discharged in stable condition. Detailed discussions were had with the patient regarding current findings, and need  for close f/u with  PCP or on call doctor. The patient has been instructed to return immediately if the symptoms worsen in any way for re-evaluation. Patient verbalized understanding and is in agreement with current care plan. All questions answered prior to discharge.                      Complexity of Problems Addressed Acute illness or injury that poses threat of life of bodily function  Additional Data Reviewed and Analyzed Further history obtained from: Past medical history and medications listed in the EMR, Recent PCP notes, and Prior labs/imaging results  Patient Encounter Risk Assessment Consideration of hospitalization      This chart was dictated using voice recognition software.  Despite best efforts to proofread,  errors can occur which can change the documentation meaning.         Final Clinical Impression(s) / ED Diagnoses Final diagnoses:  Pain of right hand    Rx / DC Orders ED Discharge Orders     None         Jeanell Sparrow, DO 10/17/21 1156

## 2021-10-17 NOTE — ED Triage Notes (Signed)
Reported had some thorn on her left hand about  a month ago, got swollen + fever, stated she finished a week of prescribed ABX but has ongoing swelling and discomfort. Stated primary advised her to get an XRAY.  ?

## 2021-10-17 NOTE — Discharge Instructions (Addendum)
It was a pleasure caring for you today in the emergency department. ? ?Please follow-up with your primary care doctor regarding ongoing discomfort to your right hand.  Please continue taking antibiotics that were initiated by primary care doctor ? ?You may take over-the-counter Motrin or Tylenol as needed for pain and discomfort. ? ?If you develop worsening redness, ulceration, skin changes to your right hand, worsening worrisome symptoms- please return to the emergency department. ? ?Please return to the emergency department for any worsening or worrisome symptoms. ? ?

## 2021-10-26 ENCOUNTER — Ambulatory Visit (HOSPITAL_BASED_OUTPATIENT_CLINIC_OR_DEPARTMENT_OTHER): Payer: Medicare Other | Admitting: Anesthesiology

## 2021-10-26 ENCOUNTER — Encounter (HOSPITAL_BASED_OUTPATIENT_CLINIC_OR_DEPARTMENT_OTHER): Payer: Self-pay | Admitting: Orthopedic Surgery

## 2021-10-26 ENCOUNTER — Ambulatory Visit (HOSPITAL_BASED_OUTPATIENT_CLINIC_OR_DEPARTMENT_OTHER)
Admission: RE | Admit: 2021-10-26 | Discharge: 2021-10-26 | Disposition: A | Discharge status: Home / Self Care | Payer: Medicare Other | Attending: Orthopedic Surgery | Admitting: Orthopedic Surgery

## 2021-10-26 ENCOUNTER — Encounter (HOSPITAL_BASED_OUTPATIENT_CLINIC_OR_DEPARTMENT_OTHER)
Admission: RE | Disposition: A | Discharge status: Home / Self Care | Payer: Self-pay | Source: Home / Self Care | Attending: Orthopedic Surgery

## 2021-10-26 ENCOUNTER — Other Ambulatory Visit: Payer: Self-pay

## 2021-10-26 DIAGNOSIS — Z7984 Long term (current) use of oral hypoglycemic drugs: Secondary | ICD-10-CM | POA: Insufficient documentation

## 2021-10-26 DIAGNOSIS — I129 Hypertensive chronic kidney disease with stage 1 through stage 4 chronic kidney disease, or unspecified chronic kidney disease: Secondary | ICD-10-CM

## 2021-10-26 DIAGNOSIS — E1122 Type 2 diabetes mellitus with diabetic chronic kidney disease: Secondary | ICD-10-CM

## 2021-10-26 DIAGNOSIS — M65842 Other synovitis and tenosynovitis, left hand: Secondary | ICD-10-CM

## 2021-10-26 DIAGNOSIS — I10 Essential (primary) hypertension: Secondary | ICD-10-CM | POA: Insufficient documentation

## 2021-10-26 DIAGNOSIS — E039 Hypothyroidism, unspecified: Secondary | ICD-10-CM | POA: Diagnosis not present

## 2021-10-26 DIAGNOSIS — I252 Old myocardial infarction: Secondary | ICD-10-CM | POA: Diagnosis not present

## 2021-10-26 DIAGNOSIS — Z87891 Personal history of nicotine dependence: Secondary | ICD-10-CM | POA: Insufficient documentation

## 2021-10-26 DIAGNOSIS — M65832 Other synovitis and tenosynovitis, left forearm: Secondary | ICD-10-CM | POA: Diagnosis not present

## 2021-10-26 DIAGNOSIS — N189 Chronic kidney disease, unspecified: Secondary | ICD-10-CM | POA: Diagnosis not present

## 2021-10-26 DIAGNOSIS — M659 Synovitis and tenosynovitis, unspecified: Secondary | ICD-10-CM | POA: Diagnosis not present

## 2021-10-26 DIAGNOSIS — E119 Type 2 diabetes mellitus without complications: Secondary | ICD-10-CM | POA: Diagnosis not present

## 2021-10-26 DIAGNOSIS — M6588 Other synovitis and tenosynovitis, other site: Secondary | ICD-10-CM | POA: Diagnosis not present

## 2021-10-26 HISTORY — PX: TENOSYNOVECTOMY: SHX6110

## 2021-10-26 HISTORY — DX: Cardiac murmur, unspecified: R01.1

## 2021-10-26 LAB — GLUCOSE, CAPILLARY
Glucose-Capillary: 179 mg/dL — ABNORMAL HIGH (ref 70–99)
Glucose-Capillary: 139 mg/dL — ABNORMAL HIGH (ref 70–99)

## 2021-10-26 SURGERY — TENOSYNOVECTOMY
Anesthesia: General | Site: Hand | Laterality: Left

## 2021-10-26 MED ORDER — DEXAMETHASONE SODIUM PHOSPHATE 10 MG/ML IJ SOLN
INTRAMUSCULAR | Status: AC
  Filled 2021-10-26: qty 1

## 2021-10-26 MED ORDER — CEFAZOLIN SODIUM-DEXTROSE 2-4 GM/100ML-% IV SOLN
2.0000 g | INTRAVENOUS | Status: AC
  Administered 2021-10-26: 2 g via INTRAVENOUS

## 2021-10-26 MED ORDER — ACETAMINOPHEN 500 MG PO TABS
ORAL_TABLET | ORAL | Status: AC
  Filled 2021-10-26: qty 2

## 2021-10-26 MED ORDER — AMISULPRIDE (ANTIEMETIC) 5 MG/2ML IV SOLN: 10.0000 mg | Freq: Once | INTRAVENOUS | Status: DC | PRN

## 2021-10-26 MED ORDER — PROPOFOL 10 MG/ML IV BOLUS
INTRAVENOUS | Status: AC
  Filled 2021-10-26: qty 20

## 2021-10-26 MED ORDER — OXYCODONE HCL 5 MG/5ML PO SOLN: 5.0000 mg | Freq: Once | ORAL | Status: AC | PRN

## 2021-10-26 MED ORDER — ONDANSETRON HCL 4 MG/2ML IJ SOLN
INTRAMUSCULAR | Status: AC
  Filled 2021-10-26: qty 2

## 2021-10-26 MED ORDER — LACTATED RINGERS IV SOLN: INTRAVENOUS | Status: DC

## 2021-10-26 MED ORDER — FENTANYL CITRATE (PF) 100 MCG/2ML IJ SOLN
INTRAMUSCULAR | Status: AC
  Filled 2021-10-26: qty 2

## 2021-10-26 MED ORDER — LIDOCAINE 2% (20 MG/ML) 5 ML SYRINGE
INTRAMUSCULAR | Status: AC
  Filled 2021-10-26: qty 5

## 2021-10-26 MED ORDER — MIDAZOLAM HCL 2 MG/2ML IJ SOLN
INTRAMUSCULAR | Status: AC
Start: 2021-10-26 — End: ?
  Filled 2021-10-26: qty 2

## 2021-10-26 MED ORDER — DEXAMETHASONE SODIUM PHOSPHATE 10 MG/ML IJ SOLN
INTRAMUSCULAR | Status: DC | PRN
  Administered 2021-10-26: 5 mg via INTRAVENOUS

## 2021-10-26 MED ORDER — ONDANSETRON HCL 4 MG/2ML IJ SOLN: 4.0000 mg | Freq: Once | INTRAMUSCULAR | Status: DC | PRN

## 2021-10-26 MED ORDER — PROPOFOL 10 MG/ML IV BOLUS
INTRAVENOUS | Status: DC | PRN
  Administered 2021-10-26: 100 mg via INTRAVENOUS

## 2021-10-26 MED ORDER — ONDANSETRON HCL 4 MG/2ML IJ SOLN
INTRAMUSCULAR | Status: DC | PRN
  Administered 2021-10-26: 4 mg via INTRAVENOUS

## 2021-10-26 MED ORDER — MIDAZOLAM HCL 5 MG/5ML IJ SOLN
INTRAMUSCULAR | Status: DC | PRN
  Administered 2021-10-26: 2 mg via INTRAVENOUS

## 2021-10-26 MED ORDER — OXYCODONE HCL 5 MG PO TABS
ORAL_TABLET | ORAL | Status: AC
  Filled 2021-10-26: qty 1

## 2021-10-26 MED ORDER — OXYCODONE HCL 5 MG PO TABS
5.0000 mg | ORAL_TABLET | Freq: Once | ORAL | Status: AC | PRN
  Administered 2021-10-26: 5 mg via ORAL

## 2021-10-26 MED ORDER — BUPIVACAINE HCL (PF) 0.25 % IJ SOLN
INTRAMUSCULAR | Status: AC
  Filled 2021-10-26: qty 30

## 2021-10-26 MED ORDER — FENTANYL CITRATE (PF) 100 MCG/2ML IJ SOLN
INTRAMUSCULAR | Status: DC | PRN
  Administered 2021-10-26: 50 ug via INTRAVENOUS

## 2021-10-26 MED ORDER — CEFAZOLIN SODIUM-DEXTROSE 2-4 GM/100ML-% IV SOLN
INTRAVENOUS | Status: AC
  Filled 2021-10-26: qty 100

## 2021-10-26 MED ORDER — FENTANYL CITRATE (PF) 100 MCG/2ML IJ SOLN
25.0000 ug | INTRAMUSCULAR | Status: DC | PRN
  Administered 2021-10-26: 25 ug via INTRAVENOUS

## 2021-10-26 MED ORDER — LIDOCAINE 2% (20 MG/ML) 5 ML SYRINGE
INTRAMUSCULAR | Status: DC | PRN
  Administered 2021-10-26: 60 mg via INTRAVENOUS

## 2021-10-26 MED ORDER — 0.9 % SODIUM CHLORIDE (POUR BTL) OPTIME
TOPICAL | Status: DC | PRN
  Administered 2021-10-26: 90 mL

## 2021-10-26 MED ORDER — ACETAMINOPHEN 500 MG PO TABS
1000.0000 mg | ORAL_TABLET | Freq: Once | ORAL | Status: AC
  Administered 2021-10-26: 1000 mg via ORAL

## 2021-10-26 MED ORDER — BUPIVACAINE HCL (PF) 0.25 % IJ SOLN
INTRAMUSCULAR | Status: DC | PRN
  Administered 2021-10-26: 9 mL

## 2021-10-26 MED ORDER — HYDROCODONE-ACETAMINOPHEN 5-325 MG PO TABS: ORAL_TABLET | ORAL | 0 refills | Status: DC

## 2021-10-26 SURGICAL SUPPLY — 42 items
APL PRP STRL LF DISP 70% ISPRP (MISCELLANEOUS) ×1
BLADE MINI RND TIP GREEN BEAV (BLADE) IMPLANT
BLADE SURG 15 STRL LF DISP TIS (BLADE) ×2 IMPLANT
BLADE SURG 15 STRL SS (BLADE) ×4
BNDG CMPR 9X4 STRL LF SNTH (GAUZE/BANDAGES/DRESSINGS) ×1
BNDG ELASTIC 3X5.8 VLCR STR LF (GAUZE/BANDAGES/DRESSINGS) ×1 IMPLANT
BNDG ESMARK 4X9 LF (GAUZE/BANDAGES/DRESSINGS) ×1 IMPLANT
BNDG GAUZE ELAST 4 BULKY (GAUZE/BANDAGES/DRESSINGS) ×2 IMPLANT
CHLORAPREP W/TINT 26 (MISCELLANEOUS) ×2 IMPLANT
CORD BIPOLAR FORCEPS 12FT (ELECTRODE) ×2 IMPLANT
COVER BACK TABLE 60X90IN (DRAPES) ×2 IMPLANT
COVER MAYO STAND STRL (DRAPES) ×2 IMPLANT
CUFF TOURN SGL QUICK 18X4 (TOURNIQUET CUFF) ×1 IMPLANT
DRAPE EXTREMITY T 121X128X90 (DISPOSABLE) ×2 IMPLANT
DRAPE SURG 17X23 STRL (DRAPES) ×1 IMPLANT
DRAPE U-SHAPE 47X51 STRL (DRAPES) ×1 IMPLANT
DRSG PAD ABDOMINAL 8X10 ST (GAUZE/BANDAGES/DRESSINGS) ×2 IMPLANT
GAUZE SPONGE 4X4 12PLY STRL (GAUZE/BANDAGES/DRESSINGS) ×2 IMPLANT
GAUZE XEROFORM 1X8 LF (GAUZE/BANDAGES/DRESSINGS) ×2 IMPLANT
GLOVE SRG 8 PF TXTR STRL LF DI (GLOVE) ×1 IMPLANT
GLOVE SURG ENC MOIS LTX SZ7.5 (GLOVE) ×2 IMPLANT
GLOVE SURG POLYISO LF SZ6.5 (GLOVE) ×1 IMPLANT
GLOVE SURG POLYISO LF SZ7 (GLOVE) ×1 IMPLANT
GLOVE SURG UNDER POLY LF SZ6.5 (GLOVE) ×1 IMPLANT
GLOVE SURG UNDER POLY LF SZ7 (GLOVE) ×2 IMPLANT
GLOVE SURG UNDER POLY LF SZ8 (GLOVE) ×2
GOWN STRL REUS W/ TWL LRG LVL3 (GOWN DISPOSABLE) ×1 IMPLANT
GOWN STRL REUS W/ TWL XL LVL3 (GOWN DISPOSABLE) IMPLANT
GOWN STRL REUS W/TWL LRG LVL3 (GOWN DISPOSABLE)
GOWN STRL REUS W/TWL XL LVL3 (GOWN DISPOSABLE) ×4 IMPLANT
NDL HYPO 25X1 1.5 SAFETY (NEEDLE) ×1 IMPLANT
NEEDLE HYPO 25X1 1.5 SAFETY (NEEDLE) ×2 IMPLANT
NS IRRIG 1000ML POUR BTL (IV SOLUTION) ×2 IMPLANT
PACK BASIN DAY SURGERY FS (CUSTOM PROCEDURE TRAY) ×2 IMPLANT
PADDING CAST ABS 4INX4YD NS (CAST SUPPLIES)
PADDING CAST ABS COTTON 4X4 ST (CAST SUPPLIES) ×1 IMPLANT
STOCKINETTE 4X48 STRL (DRAPES) ×2 IMPLANT
SUT ETHILON 4 0 PS 2 18 (SUTURE) ×3 IMPLANT
SYR BULB EAR ULCER 3OZ GRN STR (SYRINGE) ×2 IMPLANT
SYR CONTROL 10ML LL (SYRINGE) ×2 IMPLANT
TOWEL GREEN STERILE FF (TOWEL DISPOSABLE) ×2 IMPLANT
UNDERPAD 30X36 HEAVY ABSORB (UNDERPADS AND DIAPERS) ×2 IMPLANT

## 2021-10-26 NOTE — Anesthesia Preprocedure Evaluation (Addendum)
Anesthesia Evaluation  ?Patient identified by MRN, date of birth, ID band ?Patient awake ? ? ? ?Reviewed: ?Allergy & Precautions, NPO status , Patient's Chart, lab work & pertinent test results, reviewed documented beta blocker date and time  ? ?History of Anesthesia Complications ?Negative for: history of anesthetic complications ? ?Airway ?Mallampati: II ? ?TM Distance: >3 FB ?Neck ROM: Full ? ? ? Dental ? ?(+) Teeth Intact, Dental Advisory Given ?  ?Pulmonary ?neg pulmonary ROS, former smoker,  ?  ?Pulmonary exam normal ? ? ? ? ? ? ? Cardiovascular ?hypertension, Pt. on medications and Pt. on home beta blockers ?+ Past MI  ?Normal cardiovascular exam ? ?Normal stress test 2018 ?  ?Neuro/Psych ?H/o subdural hematoma ? Neuromuscular disease (MS)   ? GI/Hepatic ?negative GI ROS, Neg liver ROS,   ?Endo/Other  ?diabetes, Type 2, Oral Hypoglycemic AgentsHypothyroidism  ? Renal/GU ?CRFRenal disease  ?negative genitourinary ?  ?Musculoskeletal ?negative musculoskeletal ROS ?(+)  ? Abdominal ?  ?Peds ? Hematology ?negative hematology ROS ?(+) Effient   ?Anesthesia Other Findings ? ? Reproductive/Obstetrics ? ?  ? ? ? ? ? ? ? ? ? ? ? ? ? ?  ?  ? ? ? ? ? ? ? ?Anesthesia Physical ?Anesthesia Plan ? ?ASA: 3 ? ?Anesthesia Plan: General  ? ?Post-op Pain Management: Tylenol PO (pre-op)*  ? ?Induction: Intravenous ? ?PONV Risk Score and Plan: 3 and Ondansetron, Dexamethasone, Midazolam and Treatment may vary due to age or medical condition ? ?Airway Management Planned: LMA ? ?Additional Equipment: None ? ?Intra-op Plan:  ? ?Post-operative Plan: Extubation in OR ? ?Informed Consent: I have reviewed the patients History and Physical, chart, labs and discussed the procedure including the risks, benefits and alternatives for the proposed anesthesia with the patient or authorized representative who has indicated his/her understanding and acceptance.  ? ? ? ?Dental advisory given ? ?Plan Discussed  with:  ? ?Anesthesia Plan Comments:   ? ? ? ? ? ?Anesthesia Quick Evaluation ? ?

## 2021-10-26 NOTE — Anesthesia Procedure Notes (Signed)
Procedure Name: LMA Insertion ?Date/Time: 10/26/2021 1:29 PM ?Performed by: Maryella Shivers, CRNA ?Pre-anesthesia Checklist: Patient identified, Emergency Drugs available, Suction available and Patient being monitored ?Patient Re-evaluated:Patient Re-evaluated prior to induction ?Oxygen Delivery Method: Circle system utilized ?Preoxygenation: Pre-oxygenation with 100% oxygen ?Induction Type: IV induction ?Ventilation: Mask ventilation without difficulty ?LMA: LMA inserted ?LMA Size: 4.0 ?Number of attempts: 1 ?Airway Equipment and Method: Bite block ?Placement Confirmation: positive ETCO2 ?Tube secured with: Tape ?Dental Injury: Teeth and Oropharynx as per pre-operative assessment  ? ? ? ? ?

## 2021-10-26 NOTE — Discharge Instructions (Addendum)
?  Post Anesthesia Home Care Instructions ? ?Activity: ?Get plenty of rest for the remainder of the day. A responsible individual must stay with you for 24 hours following the procedure.  ?For the next 24 hours, DO NOT: ?-Drive a car ?-Paediatric nurse ?-Drink alcoholic beverages ?-Take any medication unless instructed by your physician ?-Make any legal decisions or sign important papers. ? ?Meals: ?Start with liquid foods such as gelatin or soup. Progress to regular foods as tolerated. Avoid greasy, spicy, heavy foods. If nausea and/or vomiting occur, drink only clear liquids until the nausea and/or vomiting subsides. Call your physician if vomiting continues. ? ?Special Instructions/Symptoms: ?Your throat may feel dry or sore from the anesthesia or the breathing tube placed in your throat during surgery. If this causes discomfort, gargle with warm salt water. The discomfort should disappear within 24 hours. ? ?Next dose of Tylenol can be taken at 530pm today if needed. ? ?   [facility] Instructions ?Hand Surgery ? ?Wound Care: ?Keep your hand elevated above the level of your heart.  Do not allow it to dangle by your side.  Keep the dressing dry and do not remove it unless your doctor advises you to do so.  He will usually change it at the time of your post-op visit.  Moving your fingers is advised to stimulate circulation but will depend on the site of your surgery.  If you have a splint applied, your doctor will advise you regarding movement. ? ?Activity: ?Do not drive or operate machinery today.  Rest today and then you may return to your normal activity and work as indicated by your physician. ? ?Diet:  ?Drink liquids today or eat a light diet.  You may resume a regular diet tomorrow.   ? ?General expectations: ?Pain for two to three days. ?Fingers may become slightly swollen. ? ?Call your doctor if any of the following occur: ?Severe pain not relieved by pain medication. ?Elevated temperature. ?Dressing  soaked with blood. ?Inability to move fingers. ?White or bluish color to fingers. ? ?

## 2021-10-26 NOTE — Op Note (Addendum)
NAME: Daisy Bennett ?MEDICAL RECORD NO: [id number] ?DATE OF BIRTH: [birth year] ?FACILITY:  ?LOCATION: [facility] ?PHYSICIAN: Tennis Must, MD ?  ?OPERATIVE REPORT ?  ?DATE OF PROCEDURE: 10/26/21  ?  ?PREOPERATIVE DIAGNOSIS: Left wrist extensor tenosynovitis ?  ?POSTOPERATIVE DIAGNOSIS: Wrist extensor tenosynovitis ?  ?PROCEDURE: Left wrist extensor tenosynovectomy ?  ?SURGEON:  Leanora Cover, M.D. ?  ?ASSISTANT: none ?  ?ANESTHESIA:  General ?  ?INTRAVENOUS FLUIDS:  Per anesthesia flow sheet. ?  ?ESTIMATED BLOOD LOSS:  Minimal. ?  ?COMPLICATIONS:  None. ?  ?SPECIMENS: Left wrist tenosynovium to pathology ?  ?TOURNIQUET TIME:   ? ?Total Tourniquet Time Documented: ?Upper Arm (Left) - 31 minutes ?Total: Upper Arm (Left) - 31 minutes ? ?  ?DISPOSITION:  Stable to PACU. ?  ?INDICATIONS: 66 year old female with left wrist swelling and pain.  Ultrasound confirms tenosynovitis.  She wishes to have left wrist extensor tenosynovectomy.  Risks, benefits and alternatives of surgery were discussed including the risks of blood loss, infection, damage to nerves, vessels, tendons, ligaments, bone for surgery, need for additional surgery, complications with wound healing, continued pain, stiffness, , recurrence.  She voiced understanding of these risks and elected to proceed. ? ?OPERATIVE COURSE:  After being identified preoperatively by myself,  the patient and I agreed on the procedure and site of the procedure.  The surgical site was marked.  Surgical consent had been signed. Preoperative IV antibiotic prophylaxis was given. She was transferred to the operating room and placed on the operating table in supine position with the Left upper extremity on an arm board.  General anesthesia was induced by the anesthesiologist.  Left upper extremity was prepped and draped in normal sterile orthopedic fashion.  A surgical pause was performed between the surgeons, anesthesia, and operating room staff and all were  in agreement as to the patient, procedure, and site of procedure.  Tourniquet at the proximal aspect of the extremity was inflated to 250 mmHg after exsanguination of the arm with an Esmarch bandage.  Longitudinal incision was made over the swelling on the dorsum of the hand and wrist.  This was carried in subcutaneous tissues by spreading technique.  Bipolar electrocautery was used to obtain hemostasis.  The fascia was opened over top of the extensor tendon of the fourth compartment.  There was thick and brown synovium surrounding the tendons.  This was carefully freed up.  It was removed with the scissors and rongeurs.  The tendons were placed under traction from underneath the fourth dorsal compartment and any area here debrided as well.  A second incision was made over the distal forearm.  This again was carried in subcutaneous tissues by spreading technique.  Bipolar electrocautery was used to obtain hemostasis.  The fourth dorsal compartment tendons were again identified.  There was some of the brownish material surrounding these as well.  This was carefully removed with the scissors and rongeurs.  The tendons were placed under traction and brought out from the fourth dorsal compartment.  There was no remaining tenosynovitis.  The wounds were copiously irrigated with sterile saline.  They were closed with 4-0 nylon in a horizontal mattress fashion.  They were injected with quarter percent plain Marcaine to aid in postoperative analgesia.  They were then dressed with sterile Xeroform 4 x 4's and an ABD used as a splint.  This was wrapped with Kerlix and Ace bandage.  The tourniquet was deflated at 31 minutes.  Fingertips were pink with brisk capillary  refill after deflation of tourniquet.  The operative  drapes were broken down.  The patient was awoken from anesthesia safely.  She was transferred back to the stretcher and taken to PACU in stable condition.  I will see her back in the office in 1 week for  postoperative followup.  I will give her a prescription for Norco 5/325 1-2 tabs PO q6 hours prn pain, dispense # 15. ? ? ?Leanora Cover, MD ?Electronically signed, 10/26/21 ?

## 2021-10-26 NOTE — Anesthesia Postprocedure Evaluation (Signed)
Anesthesia Post Note ? ?Patient: Daisy Bennett ? ?Procedure(s) Performed: LEFT WRIST EXTENSOR TENOSYNOVECTOMY (Left: Hand) ? ?  ? ?Patient location during evaluation: PACU ?Level of consciousness: awake and alert ?Pain management: pain level controlled ?Vital Signs Assessment: post-procedure vital signs reviewed and stable ?Respiratory status: spontaneous breathing, nonlabored ventilation and respiratory function stable ?Cardiovascular status: blood pressure returned to baseline and stable ?Postop Assessment: no apparent nausea or vomiting ?Anesthetic complications: no ? ? ?No notable events documented. ? ?Last Vitals:  ?Vitals:  ? 10/26/21 1447 10/26/21 1511  ?BP: (!) 136/59 140/79  ?Pulse: 70 71  ?Resp: 13   ?Temp:  36.6 ?C  ?SpO2: 99% 95%  ?  ?Last Pain:  ?Vitals:  ? 10/26/21 1511  ?TempSrc: Oral  ?PainSc: 6   ? ? ?  ?  ?  ?  ?  ?  ? ?Lidia Collum ? ? ? ? ?

## 2021-10-26 NOTE — Transfer of Care (Signed)
Immediate Anesthesia Transfer of Care Note ? ?Patient: Daisy Bennett ? ?Procedure(s) Performed: LEFT WRIST EXTENSOR TENOSYNOVECTOMY (Left: Hand) ? ?Patient Location: PACU ? ?Anesthesia Type:General ? ?Level of Consciousness: sedated ? ?Airway & Oxygen Therapy: Patient Spontanous Breathing and Patient connected to face mask oxygen ? ?Post-op Assessment: Report given to RN and Post -op Vital signs reviewed and stable ? ?Post vital signs: Reviewed and stable ? ?Last Vitals:  ?Vitals Value Taken Time  ?BP 122/56 10/26/21 1419  ?Temp    ?Pulse 61 10/26/21 1421  ?Resp 11 10/26/21 1421  ?SpO2 98 % 10/26/21 1421  ?Vitals shown include unvalidated device data. ? ?Last Pain:  ?Vitals:  ? 10/26/21 1127  ?TempSrc: Oral  ?PainSc: 0-No pain  ?   ? ?  ? ?Complications: No notable events documented. ?

## 2021-10-26 NOTE — H&P (Signed)
Daisy Bennett is an 66 y.o. female.   ?Chief Complaint: extensor tenosynovitis ?HPI: 66 yo female with left wrist extensor tenosynovitis. It is bothersome to her.  She wishes to have left wrist extensor tenosynovectomy. ? ?Allergies:  ?Allergies  ?Allergen Reactions  ? Aubagio [Teriflunomide] Diarrhea  ?  Pancreatitis, neuropathy, elevated glucose  ? Betaseron [Interferon Beta-1b] Other (See Comments)  ?  Increased LFT's  ? Provigil [Modafinil] Swelling  ?  Tongue swelling and sores  ? Topamax [Topiramate]   ?  Cognitive slowing  ? Doxycycline Other (See Comments)  ?  Thrush,dizziness  ? ? ?Past Medical History:  ?Diagnosis Date  ? Basal cell carcinoma   ? Brain bleed ([place]) 2017  ? COVID-19 08/2019  ? DM (diabetes mellitus) ([place])   ? Former smoker   ? 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 Goiter, unspecified   ? Heart murmur   ? History of colonic polyps 2012  ? Multiple sclerosis ([place])   ? Non-ST elevated myocardial infarction (non-STEMI) ([place])   ? Olecranon bursitis of left elbow 10/17/2017  ? Oral mucositis 08/31/2019  ? SCCA (squamous cell carcinoma) of skin   ? right shin  ? SEMI (subendocardial myocardial infarction) ([place]) 09/29/2009  ? TOTAL RCA WITH STENT TO OM BRANCH  ? Shingles   ? Squamous cell carcinoma 2019  ? lower ext  ? Traumatic rupture of volar plate of left ring finger 03/25/2020  ? Unspecified essential hypertension   ? Unspecified hypothyroidism   ? ? ?Past Surgical History:  ?Procedure Laterality Date  ? CATARACT SURGERY  12/10  ? CESAREAN SECTION    ? CORONARY ANGIOPLASTY WITH STENT PLACEMENT  2011  ? KNEE SURGERY    ? leg surgery    ? squamous ccell removed   ? TONSILLECTOMY    ? VESICOVAGINAL FISTULA CLOSURE W/ TAH    ? ? ?Family History: ?Family History  ?Problem Relation Age of Onset  ? Hypertension Mother   ? Heart failure Father   ? Diabetes Father   ? Aneurysm Brother   ?     THORACIC/ABD ANEURYSM  ? Prostate cancer Brother 10  ? Diabetes Brother   ? Fibromyalgia Sister   ? Brain  cancer Maternal Grandmother 34  ? Multiple sclerosis Neg Hx   ? Colon cancer Neg Hx   ? Esophageal cancer Neg Hx   ? ? ?Social History:  ? reports that she quit smoking about 5 years ago. Her smoking use included cigarettes. She has a 15.00 pack-year smoking history. She has never used smokeless tobacco. She reports current alcohol use. She reports that she does not use drugs. ? ?Medications: ?Medications Prior to Admission  ?Medication Sig Dispense Refill  ? amLODipine (NORVASC) 10 MG tablet TAKE 1 TABLET BY MOUTH EVERY DAY 90 tablet 3  ? B Complex Vitamins (B COMPLEX-B12 PO) Take by mouth.    ? carvedilol (COREG) 12.5 MG tablet Take 1 tablet (12.5 mg total) by mouth 2 (two) times daily. 180 tablet 1  ? cholecalciferol (VITAMIN D) 1000 units tablet Take 1,000 Units by mouth daily.    ? fenofibrate 160 MG tablet Take 1 tablet (160 mg total) by mouth daily. 90 tablet 3  ? Ferrous Sulfate (IRON) 325 (65 FE) MG TABS Take 325 mg by mouth daily.    ? fish oil-omega-3 fatty acids 1000 MG capsule Take 1 g by mouth daily.    ? glimepiride (AMARYL) 4 MG tablet 1 tab BID 180 tablet 1  ?  hydrochlorothiazide (HYDRODIURIL) 50 MG tablet Take 1 tablet (50 mg total) by mouth daily. 90 tablet 1  ? ibuprofen (ADVIL) 400 MG tablet Take 400 mg by mouth every 6 (six) hours as needed.    ? levothyroxine (SYNTHROID) 112 MCG tablet Take 1 tablet (112 mcg total) by mouth daily. 90 tablet 3  ? losartan (COZAAR) 100 MG tablet TAKE 1 TABLET BY MOUTH DAILY. PLEASE KEEP UPCOMING APPOINTMENT IN APRIL 2022 FOR FUTURE REFILLS. THANK YOU 90 tablet 2  ? metFORMIN (GLUCOPHAGE) 1000 MG tablet Take 1 tablet (1,000 mg total) by mouth daily with breakfast. 90 tablet 1  ? mirtazapine (REMERON) 15 MG tablet Take 1 tablet (15 mg total) by mouth at bedtime. 90 tablet 1  ? pantoprazole (PROTONIX) 20 MG tablet Take 1 tablet (20 mg total) by mouth daily. 90 tablet 1  ? potassium chloride (KLOR-CON) 10 MEQ tablet Take 1 tablet (10 mEq total) by mouth daily. 30  tablet 11  ? prasugrel (EFFIENT) 10 MG TABS tablet Take 1 tablet (10 mg total) by mouth daily. 90 tablet 3  ? rosuvastatin (CRESTOR) 20 MG tablet Take 1 tablet (20 mg total) by mouth daily. 90 tablet 3  ? venlafaxine XR (EFFEXOR XR) 150 MG 24 hr capsule Take 1 capsule (150 mg total) by mouth daily with breakfast. 90 capsule 1  ? nitroGLYCERIN (NITROSTAT) 0.4 MG SL tablet Place 1 tablet (0.4 mg total) under the tongue every 5 (five) minutes x 3 doses as needed for chest pain. 25 tablet 4  ? ? ?Results for orders placed or performed during the hospital encounter of 10/26/21 (from the past 48 hour(s))  ?Glucose, capillary     Status: Abnormal  ? Collection Time: 10/26/21 11:37 AM  ?Result Value Ref Range  ? Glucose-Capillary 179 (H) 70 - 99 mg/dL  ?  Comment: Glucose reference range applies only to samples taken after fasting for at least 8 hours.  ? ? ?No results found. ? ? ? ?Blood pressure (!) 148/63, pulse 69, temperature 98.2 ?F (36.8 ?C), temperature source Oral, resp. rate 15, height '5\' 6"'$  (1.676 m), weight 79.1 kg, SpO2 100 %. ? ?General appearance: alert, cooperative, and appears stated age ?Head: Normocephalic, without obvious abnormality, atraumatic ?Neck: supple, symmetrical, trachea midline ?Extremities: Intact sensation and capillary refill all digits.  +epl/fpl/io.  No wounds.  ?Pulses: 2+ and symmetric ?Skin: Skin color, texture, turgor normal. No rashes or lesions ?Neurologic: Grossly normal ?Incision/Wound: none ? ?Assessment/Plan ?Left wrist extensor tenosynovitis.  Non operative and operative treatment options have been discussed with the patient and patient wishes to proceed with operative treatment. Risks, benefits, and alternatives of surgery have been discussed and the patient agrees with the plan of care.  ? ?Leanora Cover ?10/26/2021, 1:09 PM  ? ?

## 2021-10-27 ENCOUNTER — Encounter (HOSPITAL_BASED_OUTPATIENT_CLINIC_OR_DEPARTMENT_OTHER): Payer: Self-pay | Admitting: Orthopedic Surgery

## 2021-10-27 LAB — SURGICAL PATHOLOGY

## 2021-11-02 DIAGNOSIS — Z85828 Personal history of other malignant neoplasm of skin: Secondary | ICD-10-CM | POA: Diagnosis not present

## 2021-11-02 DIAGNOSIS — D225 Melanocytic nevi of trunk: Secondary | ICD-10-CM | POA: Diagnosis not present

## 2021-11-02 DIAGNOSIS — L814 Other melanin hyperpigmentation: Secondary | ICD-10-CM | POA: Diagnosis not present

## 2021-11-02 DIAGNOSIS — L578 Other skin changes due to chronic exposure to nonionizing radiation: Secondary | ICD-10-CM | POA: Diagnosis not present

## 2021-11-02 DIAGNOSIS — L821 Other seborrheic keratosis: Secondary | ICD-10-CM | POA: Diagnosis not present

## 2021-11-10 DIAGNOSIS — M65832 Other synovitis and tenosynovitis, left forearm: Secondary | ICD-10-CM | POA: Diagnosis not present

## 2021-11-13 ENCOUNTER — Other Ambulatory Visit: Payer: Self-pay | Admitting: Cardiovascular Disease

## 2021-11-16 DIAGNOSIS — M25642 Stiffness of left hand, not elsewhere classified: Secondary | ICD-10-CM | POA: Diagnosis not present

## 2021-11-16 DIAGNOSIS — M65832 Other synovitis and tenosynovitis, left forearm: Secondary | ICD-10-CM | POA: Diagnosis not present

## 2021-11-23 DIAGNOSIS — Z20822 Contact with and (suspected) exposure to covid-19: Secondary | ICD-10-CM | POA: Diagnosis not present

## 2021-11-23 DIAGNOSIS — M65832 Other synovitis and tenosynovitis, left forearm: Secondary | ICD-10-CM | POA: Diagnosis not present

## 2021-11-23 DIAGNOSIS — M25642 Stiffness of left hand, not elsewhere classified: Secondary | ICD-10-CM | POA: Diagnosis not present

## 2021-11-23 DIAGNOSIS — M79645 Pain in left finger(s): Secondary | ICD-10-CM | POA: Diagnosis not present

## 2021-11-26 DIAGNOSIS — M79645 Pain in left finger(s): Secondary | ICD-10-CM | POA: Diagnosis not present

## 2021-11-26 DIAGNOSIS — M25642 Stiffness of left hand, not elsewhere classified: Secondary | ICD-10-CM | POA: Diagnosis not present

## 2021-11-26 DIAGNOSIS — M65832 Other synovitis and tenosynovitis, left forearm: Secondary | ICD-10-CM | POA: Diagnosis not present

## 2021-11-30 DIAGNOSIS — M25632 Stiffness of left wrist, not elsewhere classified: Secondary | ICD-10-CM | POA: Diagnosis not present

## 2021-11-30 DIAGNOSIS — M25642 Stiffness of left hand, not elsewhere classified: Secondary | ICD-10-CM | POA: Diagnosis not present

## 2021-11-30 DIAGNOSIS — M65832 Other synovitis and tenosynovitis, left forearm: Secondary | ICD-10-CM | POA: Diagnosis not present

## 2021-11-30 DIAGNOSIS — R29898 Other symptoms and signs involving the musculoskeletal system: Secondary | ICD-10-CM | POA: Diagnosis not present

## 2021-12-02 ENCOUNTER — Other Ambulatory Visit: Payer: Self-pay | Admitting: Cardiovascular Disease

## 2021-12-02 ENCOUNTER — Ambulatory Visit (INDEPENDENT_AMBULATORY_CARE_PROVIDER_SITE_OTHER): Payer: Medicare Other

## 2021-12-02 DIAGNOSIS — Z20822 Contact with and (suspected) exposure to covid-19: Secondary | ICD-10-CM | POA: Diagnosis not present

## 2021-12-02 DIAGNOSIS — Z0000 Encounter for general adult medical examination without abnormal findings: Secondary | ICD-10-CM | POA: Diagnosis not present

## 2021-12-02 NOTE — Patient Instructions (Signed)
Ms. Bickley , Thank you for taking time to come for your Medicare Wellness Visit. I appreciate your ongoing commitment to your health goals. Please review the following plan we discussed and let me know if I can assist you in the future.   Screening recommendations/referrals: Colonoscopy: Done 07/16/21 repeat every 10 years  Mammogram: Done 08/04/21 repeat every year  Bone Density: Done 02/18/16 repeat every 10 years  Recommended yearly ophthalmology/optometry visit for glaucoma screening and checkup Recommended yearly dental visit for hygiene and checkup  Vaccinations: Influenza vaccine: Done 04/02/21 repeat every year  Pneumococcal vaccine: Up to date Tdap vaccine: Done 12/26/13 repeat every 10 years  Shingles vaccine: Completed 3/22, 02/07/20   Covid-19:Completed 07/23/20 & 08/27/20  Advanced directives: Please bring a copy of your health care power of attorney and living will to the office at your convenience.  Conditions/risks identified: None at this time   Next appointment: Follow up in one year for your annual wellness visit    Preventive Care 65 Years and Older, Female Preventive care refers to lifestyle choices and visits with your health care provider that can promote health and wellness. What does preventive care include? A yearly physical exam. This is also called an annual well check. Dental exams once or twice a year. Routine eye exams. Ask your health care provider how often you should have your eyes checked. Personal lifestyle choices, including: Daily care of your teeth and gums. Regular physical activity. Eating a healthy diet. Avoiding tobacco and drug use. Limiting alcohol use. Practicing safe sex. Taking low-dose aspirin every day. Taking vitamin and mineral supplements as recommended by your health care provider. What happens during an annual well check? The services and screenings done by your health care provider during your annual well check will depend on your  age, overall health, lifestyle risk factors, and family history of disease. Counseling  Your health care provider may ask you questions about your: Alcohol use. Tobacco use. Drug use. Emotional well-being. Home and relationship well-being. Sexual activity. Eating habits. History of falls. Memory and ability to understand (cognition). Work and work Statistician. Reproductive health. Screening  You may have the following tests or measurements: Height, weight, and BMI. Blood pressure. Lipid and cholesterol levels. These may be checked every 5 years, or more frequently if you are over 55 years old. Skin check. Lung cancer screening. You may have this screening every year starting at age 32 if you have a 30-pack-year history of smoking and currently smoke or have quit within the past 15 years. Fecal occult blood test (FOBT) of the stool. You may have this test every year starting at age 42. Flexible sigmoidoscopy or colonoscopy. You may have a sigmoidoscopy every 5 years or a colonoscopy every 10 years starting at age 20. Hepatitis C blood test. Hepatitis B blood test. Sexually transmitted disease (STD) testing. Diabetes screening. This is done by checking your blood sugar (glucose) after you have not eaten for a while (fasting). You may have this done every 1-3 years. Bone density scan. This is done to screen for osteoporosis. You may have this done starting at age 12. Mammogram. This may be done every 1-2 years. Talk to your health care provider about how often you should have regular mammograms. Talk with your health care provider about your test results, treatment options, and if necessary, the need for more tests. Vaccines  Your health care provider may recommend certain vaccines, such as: Influenza vaccine. This is recommended every year. Tetanus, diphtheria, and  acellular pertussis (Tdap, Td) vaccine. You may need a Td booster every 10 years. Zoster vaccine. You may need this after  age 59. Pneumococcal 13-valent conjugate (PCV13) vaccine. One dose is recommended after age 43. Pneumococcal polysaccharide (PPSV23) vaccine. One dose is recommended after age 109. Talk to your health care provider about which screenings and vaccines you need and how often you need them. This information is not intended to replace advice given to you by your health care provider. Make sure you discuss any questions you have with your health care provider. Document Released: 08/15/2015 Document Revised: 04/07/2016 Document Reviewed: 05/20/2015 Elsevier Interactive Patient Education  2017 [employer] Prevention in the Home Falls can cause injuries. They can happen to people of all ages. There are many things you can do to make your home safe and to help prevent falls. What can I do on the outside of my home? Regularly fix the edges of walkways and driveways and fix any cracks. Remove anything that might make you trip as you walk through a door, such as a raised step or threshold. Trim any bushes or trees on the path to your home. Use bright outdoor lighting. Clear any walking paths of anything that might make someone trip, such as rocks or tools. Regularly check to see if handrails are loose or broken. Make sure that both sides of any steps have handrails. Any raised decks and porches should have guardrails on the edges. Have any leaves, snow, or ice cleared regularly. Use sand or salt on walking paths during winter. Clean up any spills in your garage right away. This includes oil or grease spills. What can I do in the bathroom? Use night lights. Install grab bars by the toilet and in the tub and shower. Do not use towel bars as grab bars. Use non-skid mats or decals in the tub or shower. If you need to sit down in the shower, use a plastic, non-slip stool. Keep the floor dry. Clean up any water that spills on the floor as soon as it happens. Remove soap buildup in the tub or shower  regularly. Attach bath mats securely with double-sided non-slip rug tape. Do not have throw rugs and other things on the floor that can make you trip. What can I do in the bedroom? Use night lights. Make sure that you have a light by your bed that is easy to reach. Do not use any sheets or blankets that are too big for your bed. They should not hang down onto the floor. Have a firm chair that has side arms. You can use this for support while you get dressed. Do not have throw rugs and other things on the floor that can make you trip. What can I do in the kitchen? Clean up any spills right away. Avoid walking on wet floors. Keep items that you use a lot in easy-to-reach places. If you need to reach something above you, use a strong step stool that has a grab bar. Keep electrical cords out of the way. Do not use floor polish or wax that makes floors slippery. If you must use wax, use non-skid floor wax. Do not have throw rugs and other things on the floor that can make you trip. What can I do with my stairs? Do not leave any items on the stairs. Make sure that there are handrails on both sides of the stairs and use them. Fix handrails that are broken or loose. Make sure that  handrails are as long as the stairways. Check any carpeting to make sure that it is firmly attached to the stairs. Fix any carpet that is loose or worn. Avoid having throw rugs at the top or bottom of the stairs. If you do have throw rugs, attach them to the floor with carpet tape. Make sure that you have a light switch at the top of the stairs and the bottom of the stairs. If you do not have them, ask someone to add them for you. What else can I do to help prevent falls? Wear shoes that: Do not have high heels. Have rubber bottoms. Are comfortable and fit you well. Are closed at the toe. Do not wear sandals. If you use a stepladder: Make sure that it is fully opened. Do not climb a closed stepladder. Make sure that  both sides of the stepladder are locked into place. Ask someone to hold it for you, if possible. Clearly mark and make sure that you can see: Any grab bars or handrails. First and last steps. Where the edge of each step is. Use tools that help you move around (mobility aids) if they are needed. These include: Canes. Walkers. Scooters. Crutches. Turn on the lights when you go into a dark area. Replace any light bulbs as soon as they burn out. Set up your furniture so you have a clear path. Avoid moving your furniture around. If any of your floors are uneven, fix them. If there are any pets around you, be aware of where they are. Review your medicines with your doctor. Some medicines can make you feel dizzy. This can increase your chance of falling. Ask your doctor what other things that you can do to help prevent falls. This information is not intended to replace advice given to you by your health care provider. Make sure you discuss any questions you have with your health care provider. Document Released: 05/15/2009 Document Revised: 12/25/2015 Document Reviewed: 08/23/2014 Elsevier Interactive Patient Education  2017 [employer].

## 2021-12-02 NOTE — Progress Notes (Signed)
Virtual Visit via Telephone Note  I connected with  [name] on 12/02/21 at 11:00 AM EDT by telephone and verified that I am speaking with the correct person using two identifiers.  Medicare Annual Wellness visit completed telephonically due to Covid-19 pandemic.   Persons participating in this call: This Health Coach and this patient.   Location: Patient: Home Provider: Office   I discussed the limitations, risks, security and privacy concerns of performing an evaluation and management service by telephone and the availability of in person appointments. The patient expressed understanding and agreed to proceed.  Unable to perform video visit due to video visit attempted and failed and/or patient does not have video capability.   Some vital signs may be absent or patient reported.   Daisy Brace, LPN   Subjective:   Daisy Bennett is a 66 y.o. female who presents for Medicare Annual (Subsequent) preventive examination.  Review of Systems     Cardiac Risk Factors include: advanced age (>54mn, >>53women);diabetes mellitus;dyslipidemia;hypertension     Objective:    There were no vitals filed for this visit. There is no height or weight on file to calculate BMI.     12/02/2021   11:03 AM 10/26/2021   11:24 AM 10/17/2021    9:43 AM 11/26/2020    3:45 PM 03/18/2020    9:38 AM 02/21/2020   10:55 AM 07/06/2017    2:02 PM  Advanced Directives  Does Patient Have a Medical Advance Directive? Yes No No Yes Yes No Yes  Type of APrimary school teacherof [place] will [place] will  [place] will  Copy of [place] Chart? Yes - validated most recent copy scanned in chart (See row information)   No - copy requested     Would patient like information on creating a medical advance directive?  No - Patient declined No - Patient declined   No - Patient  declined     Current Medications (verified) Outpatient Encounter Medications as of 12/02/2021  Medication Sig   amLODipine (NORVASC) 10 MG tablet TAKE 1 TABLET BY MOUTH EVERY DAY   B Complex Vitamins (B COMPLEX-B12 PO) Take by mouth.   carvedilol (COREG) 12.5 MG tablet Take 1 tablet (12.5 mg total) by mouth 2 (two) times daily.   cholecalciferol (VITAMIN D) 1000 units tablet Take 1,000 Units by mouth daily.   fenofibrate 160 MG tablet Take 1 tablet (160 mg total) by mouth daily.   Ferrous Sulfate (IRON) 325 (65 FE) MG TABS Take 325 mg by mouth daily.   fish oil-omega-3 fatty acids 1000 MG capsule Take 1 g by mouth daily.   glimepiride (AMARYL) 4 MG tablet 1 tab BID   hydrochlorothiazide (HYDRODIURIL) 50 MG tablet Take 1 tablet (50 mg total) by mouth daily.   HYDROcodone-acetaminophen (NORCO/VICODIN) 5-325 MG tablet 1-2 tabs PO q6 hours prn pain   ibuprofen (ADVIL) 400 MG tablet Take 400 mg by mouth every 6 (six) hours as needed.   levothyroxine (SYNTHROID) 112 MCG tablet Take 1 tablet (112 mcg total) by mouth daily.   losartan (COZAAR) 100 MG tablet Take 1 tablet (100 mg total) by mouth daily. Please make overdue appt with Dr. NJohnsie Cancelbefore anymore refills. Thank you 1st attempt   metFORMIN (GLUCOPHAGE) 1000 MG tablet Take 1 tablet (1,000 mg total) by mouth daily with breakfast.   mirtazapine (REMERON) 15 MG tablet Take 1 tablet (  15 mg total) by mouth at bedtime.   nitroGLYCERIN (NITROSTAT) 0.4 MG SL tablet Place 1 tablet (0.4 mg total) under the tongue every 5 (five) minutes x 3 doses as needed for chest pain.   pantoprazole (PROTONIX) 20 MG tablet Take 1 tablet (20 mg total) by mouth daily.   potassium chloride (KLOR-CON) 10 MEQ tablet Take 1 tablet (10 mEq total) by mouth daily.   prasugrel (EFFIENT) 10 MG TABS tablet TAKE 1 TABLET BY MOUTH EVERY DAY   rosuvastatin (CRESTOR) 20 MG tablet Take 1 tablet (20 mg total) by mouth daily.   venlafaxine XR (EFFEXOR XR) 150 MG 24 hr capsule Take 1  capsule (150 mg total) by mouth daily with breakfast.   [DISCONTINUED] prasugrel (EFFIENT) 10 MG TABS tablet Take 1 tablet (10 mg total) by mouth daily.   No facility-administered encounter medications on file as of 12/02/2021.    Allergies (verified) Aubagio [teriflunomide], Betaseron [interferon beta-1b], Provigil [modafinil], Topamax [topiramate], and Doxycycline   History: Past Medical History:  Diagnosis Date   Basal cell carcinoma    Brain bleed ([place]) 2017   COVID-19 08/2019   DM (diabetes mellitus) ([place])    Former smoker    Glossitis, benign migratory 08/31/2019   Goiter, unspecified    Heart murmur    History of colonic polyps 2012   Multiple sclerosis ([place])    Non-ST elevated myocardial infarction (non-STEMI) ([place])    Olecranon bursitis of left elbow 10/17/2017   Oral mucositis 08/31/2019   SCCA (squamous cell carcinoma) of skin    right shin   SEMI (subendocardial myocardial infarction) ([place]) 09/29/2009   TOTAL RCA WITH STENT TO OM BRANCH   Shingles    Squamous cell carcinoma 2019   lower ext   Traumatic rupture of volar plate of left ring finger 03/25/2020   Unspecified essential hypertension    Unspecified hypothyroidism    Past Surgical History:  Procedure Laterality Date   CATARACT SURGERY  12/10   CESAREAN SECTION     CORONARY ANGIOPLASTY WITH STENT PLACEMENT  2011   KNEE SURGERY     leg surgery     squamous ccell removed    TENOSYNOVECTOMY Left 10/26/2021   Procedure: LEFT WRIST EXTENSOR TENOSYNOVECTOMY;  Surgeon: Leanora Cover, MD;  Location: [place];  Service: Orthopedics;  Laterality: Left;  63 MIN   TONSILLECTOMY     VESICOVAGINAL FISTULA CLOSURE W/ TAH     Family History  Problem Relation Age of Onset   Hypertension Mother    Heart failure Father    Diabetes Father    Aneurysm Brother        THORACIC/ABD ANEURYSM   Prostate cancer Brother 15   Diabetes Brother    Fibromyalgia Sister    Brain cancer Maternal Grandmother 56    Multiple sclerosis Neg Hx    Colon cancer Neg Hx    Esophageal cancer Neg Hx    Social History   Socioeconomic History   Marital status: Married    Spouse name: Not on file   Number of children: 2   Years of education: 14   Highest education level: Associate degree: academic program  Occupational History   Occupation: Therapist, music  Tobacco Use   Smoking status: Former    Packs/day: 1.00    Years: 15.00    Pack years: 15.00    Types: Cigarettes    Quit date: 12/16/2015    Years since quitting: 5.9   Smokeless tobacco: Never  Tobacco comments:    on and off smoker  Vaping Use   Vaping Use: Never used  Substance and Sexual Activity   Alcohol use: Yes    Alcohol/week: 0.0 standard drinks    Comment: OCCASIONAL ALCOHOL USE.   Drug use: No   Sexual activity: Yes  Other Topics Concern   Not on file  Social History Narrative   The patient lives with husband. 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    Patient drinks occasionally, does not chew tobacco, does not use recreational drugs. She does drink occasional caffeine. She does wear her seatbelt. She does wear bike helmet riding a bike. She does exercise 3 times a week. She does not wear hearing aids or dentures. There is a smoke alarm in her home. There are no firearms in her home. She feels safe in her relationship. She has never experienced physical abuse.   Patient sleeps 7-8 hours a night.   Patient drinks 2 cups of caffeine daily.   Patient is right handed.   Social Determinants of Health   Financial Resource Strain: Low Risk    Difficulty of Paying Living Expenses: Not hard at all  Food Insecurity: No Food Insecurity   Worried About Charity fundraiser in the Last Year: Never true   [place]room in the Last Year: Never true  Transportation Needs: No Transportation Needs   Lack of Transportation (Medical): No   Lack of Transportation (Non-Medical): No  Physical Activity:  Inactive   Days of Exercise per Week: 0 days   Minutes of Exercise per Session: 0 min  Stress: No Stress Concern Present   Feeling of Stress : Not at all  Social Connections: Moderately Integrated   Frequency of Communication with Friends and Family: More than three times a week   Frequency of Social Gatherings with Friends and Family: More than three times a week   Attends Religious Services: Never   Marine scientist or Organizations: Yes   Attends Music therapist: 1 to 4 times per year   Marital Status: Married    Tobacco Counseling Counseling given: Not Answered Tobacco comments: on and off smoker   Clinical Intake:  Pre-visit preparation completed: Yes  Pain : No/denies pain     BMI - recorded: 28.16 Nutritional Status: BMI 25 -29 Overweight Nutritional Risks: None Diabetes: Yes CBG done?: No Did pt. bring in CBG monitor from home?: No  How often do you need to have someone help you when you read instructions, pamphlets, or other written materials from your doctor or pharmacy?: 1 - Never  Diabetic?Nutrition Risk Assessment:  Has the patient had any N/V/D within the last 2 months?  No  Does the patient have any non-healing wounds?  No  Has the patient had any unintentional weight loss or weight gain?  No   Diabetes:  Is the patient diabetic?  Yes  If diabetic, was a CBG obtained today?  No  Did the patient bring in their glucometer from home?  No  How often do you monitor your CBG's? N/A.   Financial Strains and Diabetes Management:  Are you having any financial strains with the device, your supplies or your medication? No .  Does the patient want to be seen by Chronic Care Management for management of their diabetes?  No  Would the patient like to be referred to a Nutritionist or for Diabetic Management?  No   Diabetic Exams:  Diabetic Eye Exam: Completed 02/11/21 Diabetic Foot Exam: Completed 03/03/21   Interpreter Needed?:  No  Information entered by :: Charlott Rakes, LPN   Activities of Daily Living    12/02/2021   11:04 AM 10/26/2021   11:28 AM  In your present state of health, do you have any difficulty performing the following activities:  Hearing? 1 0  Comment left ear loss   Vision? 0 0  Difficulty concentrating or making decisions? 0 0  Walking or climbing stairs? 0 0  Dressing or bathing? 0 0  Doing errands, shopping? 0   Preparing Food and eating ? N   Using the Toilet? N   In the past six months, have you accidently leaked urine? N   Do you have problems with loss of bowel control? N   Managing your Medications? N   Managing your Finances? N   Housekeeping or managing your Housekeeping? N     Patient Care Team: Ma Hillock, DO as PCP - General (Family Medicine) Josue Hector, MD as PCP - Cardiology (Cardiology) [place], Jennefer Bravo, MD as Referring Physician (Dermatology) Kathrynn Ducking, MD (Inactive) as Consulting Physician (Neurology) Linda Hedges, DO as Consulting Physician (Obstetrics and Gynecology) Anell Barr, OD (Optometry)  Indicate any recent Medical Services you may have received from other than [facility] providers in the past year (date may be approximate).     Assessment:   This is a routine wellness examination for [facility].  Hearing/Vision screen Hearing Screening - Comments:: Pt hearing left ear loss  Vision Screening - Comments:: Pt follows up with Dr Ellin Mayhew for annual eye exams   Dietary issues and exercise activities discussed: Current Exercise Habits: The patient does not participate in regular exercise at present   Goals Addressed             This Visit's Progress    Patient Stated       None at this time       Depression Screen    12/02/2021   11:01 AM 09/28/2021   10:15 AM 05/27/2021    4:08 PM 11/26/2020    3:48 PM 07/09/2020    8:07 AM 03/25/2020    8:38 AM 07/04/2019    8:33 AM  PHQ 2/9 Scores  PHQ - 2 Score 0 0 0 0 0 0 0   PHQ- 9 Score  1         Fall Risk    12/02/2021   11:04 AM 05/27/2021   11:26 AM 11/26/2020    3:47 PM 10/30/2020    1:22 PM 07/09/2020    8:07 AM  Fall Risk   Falls in the past year? 1 0 0 1 0  Number falls in past yr: 1  0 1 0  Injury with Fall? 1  0 1 0  Comment left wrist possible      Risk for fall due to : Impaired vision   History of fall(s);Impaired balance/gait;Medication side effect   Follow up Falls prevention discussed  Falls prevention discussed Falls evaluation completed Falls evaluation completed    FALL RISK PREVENTION PERTAINING TO THE HOME:  Any stairs in or around the home? Yes  If so, are there any without handrails? No  Home free of loose throw rugs in walkways, pet beds, electrical cords, etc? Yes  Adequate lighting in your home to reduce risk of falls? Yes   ASSISTIVE DEVICES UTILIZED TO PREVENT FALLS:  Life alert? No  Use of a cane,  walker or w/c? No  Grab bars in the bathroom? No  Shower chair or bench in shower? No  Elevated toilet seat or a handicapped toilet? No   TIMED UP AND GO:  Was the test performed? No .   Cognitive Function:    02/12/2019   10:03 AM 08/10/2018    9:03 AM 06/22/2017    9:33 AM 10/12/2016    2:53 PM 03/10/2016    9:08 AM  MMSE - Mini Mental State Exam  Not completed:  Refused     Orientation to time '4  4 5 5  '$ Orientation to Place '5  5 5 5  '$ Registration '3  3 3 3  '$ Attention/ Calculation '5  5 5 5  '$ Recall '3  2 2 2  '$ Language- name 2 objects '2  2 2 2  '$ Language- repeat '1  1 1 1  '$ Language- follow 3 step command '3  3 3 3  '$ Language- read & follow direction '1  1 1 1  '$ Write a sentence '1  1 1 1  '$ Copy design '1  1 1 1  '$ Total score '29  28 29 29        '$ 12/02/2021   11:06 AM  6CIT Screen  What Year? 0 points  What month? 0 points  What time? 0 points  Count back from 20 0 points  Months in reverse 0 points  Repeat phrase 0 points  Total Score 0 points    Immunizations Immunization History  Administered Date(s)  Administered   Fluad Quad(high Dose 65+) 07/04/2019, 06/05/2020, 04/02/2021   Hepatitis A, Adult 10/17/2017, 05/18/2018   Influenza,inj,Quad PF,6+ Mos 04/16/2015, 05/12/2016, 06/17/2017, 05/18/2018   PFIZER Comirnaty(Gray Top)Covid-19 Tri-Sucrose Vaccine 08/27/2020   PFIZER(Purple Top)SARS-COV-2 Vaccination 07/23/2020   PNEUMOCOCCAL CONJUGATE-20 03/03/2021   Pneumococcal Conjugate-13 11/15/2016   Pneumococcal Polysaccharide-23 08/02/2009, 10/14/2015   Tdap 12/26/2013   Zoster Recombinat (Shingrix) 10/22/2019, 02/07/2020    TDAP status: Up to date  Flu Vaccine status: Up to date  Pneumococcal vaccine status: Up to date  Covid-19 vaccine status: Completed vaccines  Qualifies for Shingles Vaccine? Yes   Zostavax completed Yes   Shingrix Completed?: Yes  Screening Tests Health Maintenance  Topic Date Due   COVID-19 Vaccine (3 - Pfizer risk series) 09/24/2020   HEMOGLOBIN A1C  12/26/2021   OPHTHALMOLOGY EXAM  02/11/2022   INFLUENZA VACCINE  03/02/2022   FOOT EXAM  03/03/2022   MAMMOGRAM  08/05/2023   TETANUS/TDAP  12/27/2023   DEXA SCAN  02/17/2026   COLONOSCOPY (Pts 45-60yr Insurance coverage will need to be confirmed)  07/17/2031   Pneumonia Vaccine 68 Years old  Completed   Hepatitis C Screening  Completed   Zoster Vaccines- Shingrix  Completed   HPV VACCINES  Aged Out    Health Maintenance  Health Maintenance Due  Topic Date Due   COVID-19 Vaccine (3 - Pfizer risk series) 09/24/2020    Colorectal cancer screening: Type of screening: Colonoscopy. Completed 07/16/21. Repeat every 10 years  Mammogram status: Completed 08/04/21. Repeat every year  Bone Density status: Completed 02/18/16. Results reflect: Bone density results: NORMAL. Repeat every 10 years.   Additional Screening:  Hepatitis C Screening:  Completed 10/14/15  Vision Screening: Recommended annual ophthalmology exams for early detection of glaucoma and other disorders of the eye. Is the patient up  to date with their annual eye exam?  Yes  Who is the provider or what is the name of the office in which the patient attends annual eye  exams? Dr Ellin Mayhew  If pt is not established with a provider, would they like to be referred to a provider to establish care? No .   Dental Screening: Recommended annual dental exams for proper oral hygiene  Community Resource Referral / Chronic Care Management: CRR required this visit?  No   CCM required this visit?  No      Plan:     I have personally reviewed and noted the following in the patient's chart:   Medical and social history Use of alcohol, tobacco or illicit drugs  Current medications and supplements including opioid prescriptions.  Functional ability and status Nutritional status Physical activity Advanced directives List of other physicians Hospitalizations, surgeries, and ER visits in previous 12 months Vitals Screenings to include cognitive, depression, and falls Referrals and appointments  In addition, I have reviewed and discussed with patient certain preventive protocols, quality metrics, and best practice recommendations. A written personalized care plan for preventive services as well as general preventive health recommendations were provided to patient.     Daisy Brace, LPN   3/0/0923   Nurse Notes: None

## 2021-12-03 ENCOUNTER — Ambulatory Visit: Payer: Medicare Other | Admitting: Neurology

## 2021-12-08 DIAGNOSIS — Z20822 Contact with and (suspected) exposure to covid-19: Secondary | ICD-10-CM | POA: Diagnosis not present

## 2021-12-10 DIAGNOSIS — M25632 Stiffness of left wrist, not elsewhere classified: Secondary | ICD-10-CM | POA: Diagnosis not present

## 2021-12-10 DIAGNOSIS — M25642 Stiffness of left hand, not elsewhere classified: Secondary | ICD-10-CM | POA: Diagnosis not present

## 2021-12-10 DIAGNOSIS — R29898 Other symptoms and signs involving the musculoskeletal system: Secondary | ICD-10-CM | POA: Diagnosis not present

## 2021-12-10 DIAGNOSIS — M65832 Other synovitis and tenosynovitis, left forearm: Secondary | ICD-10-CM | POA: Diagnosis not present

## 2021-12-12 IMAGING — DX DG WRIST COMPLETE 3+V*R*
4 series · 4 of 4 positions shown · non-contrast
Comparison: None.

CLINICAL DATA: Recent fall with wrist pain, initial encounter

EXAM:
RIGHT WRIST - COMPLETE 3+ VIEW

[wrist pa]
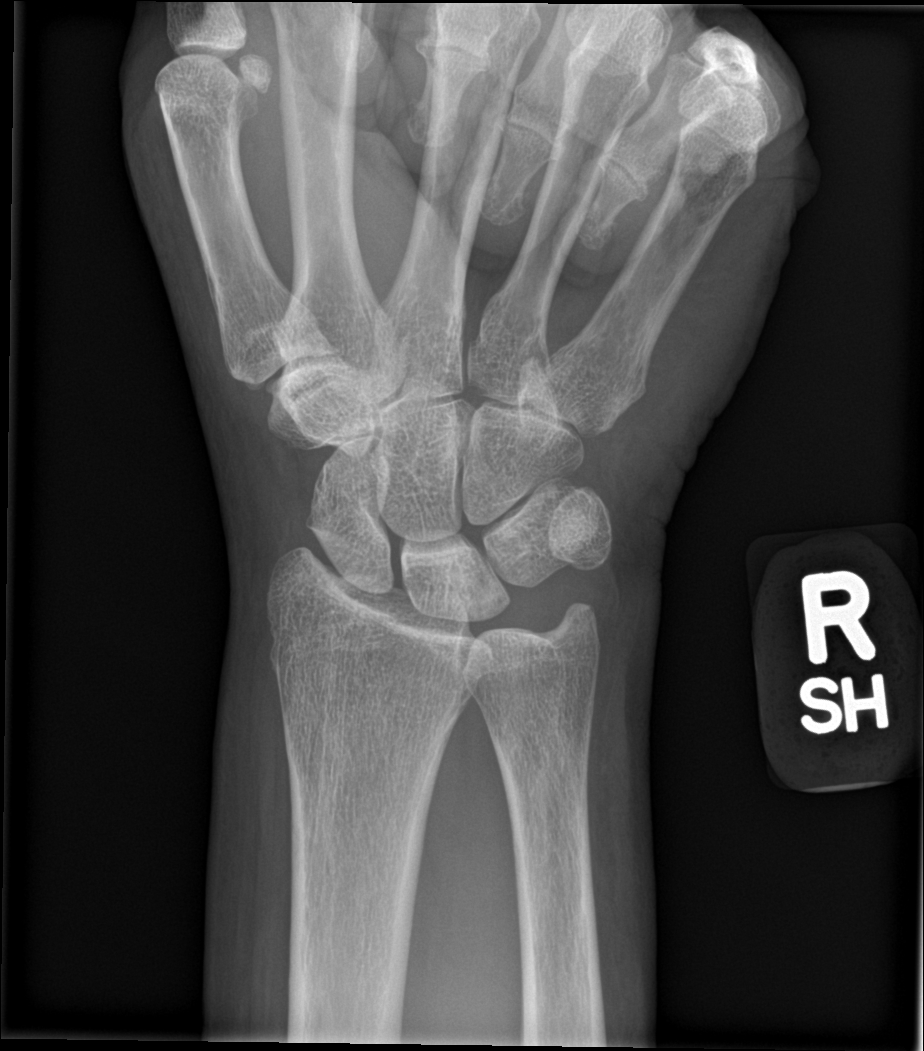

[wrist obl]
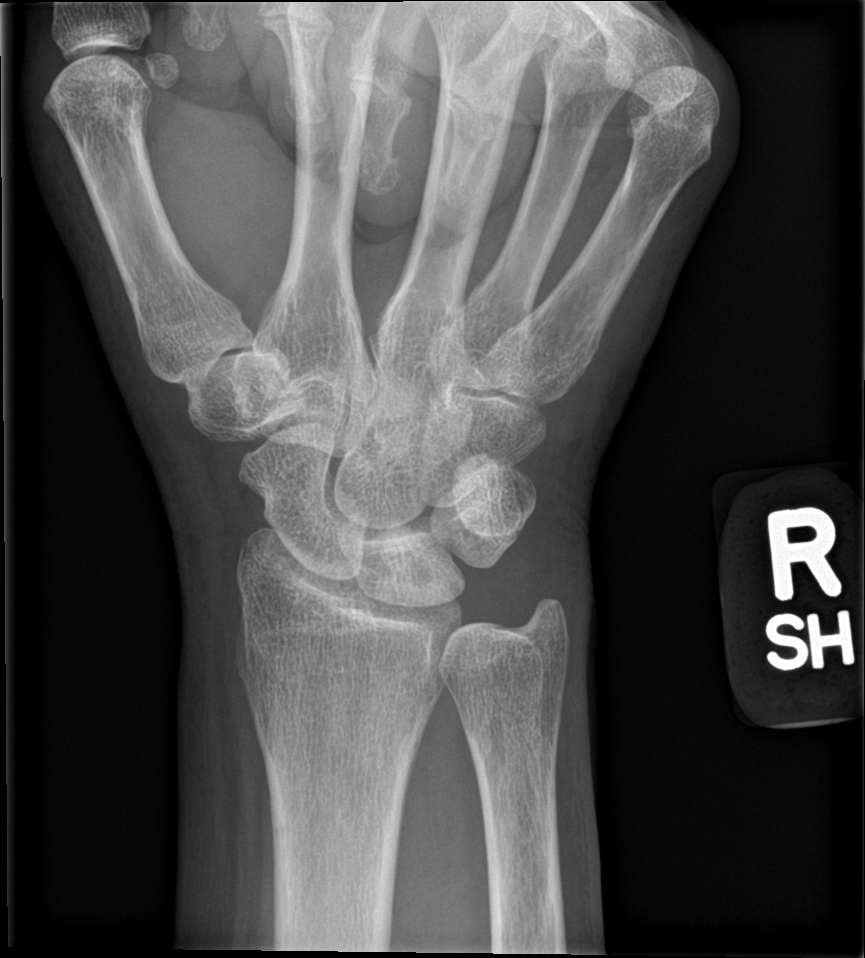

[wrist lat]
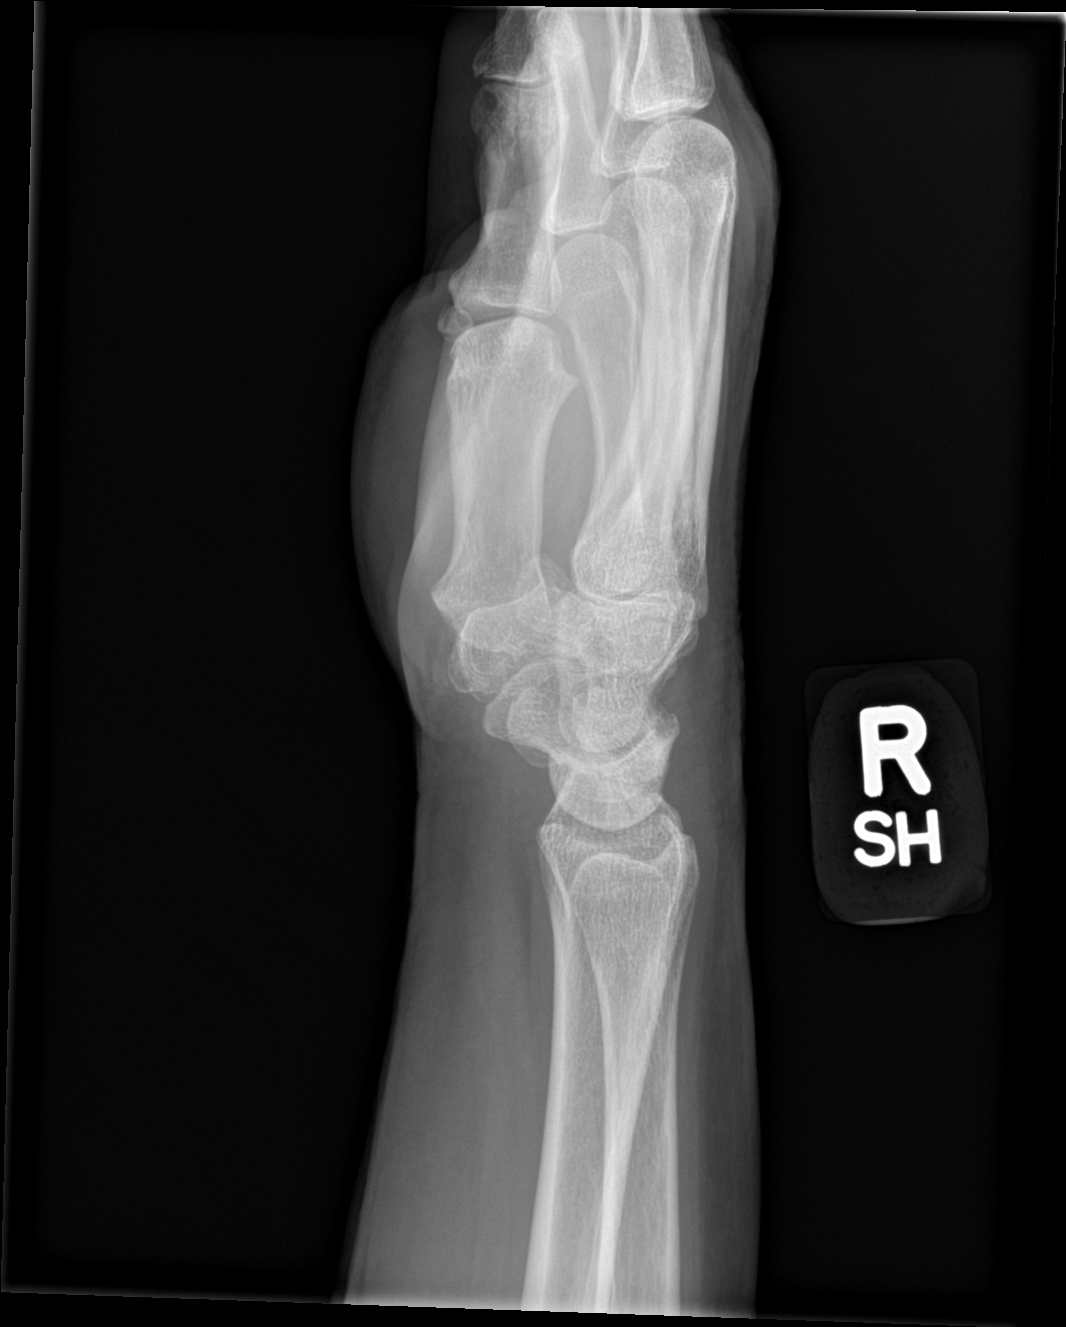

[wrist navicular]
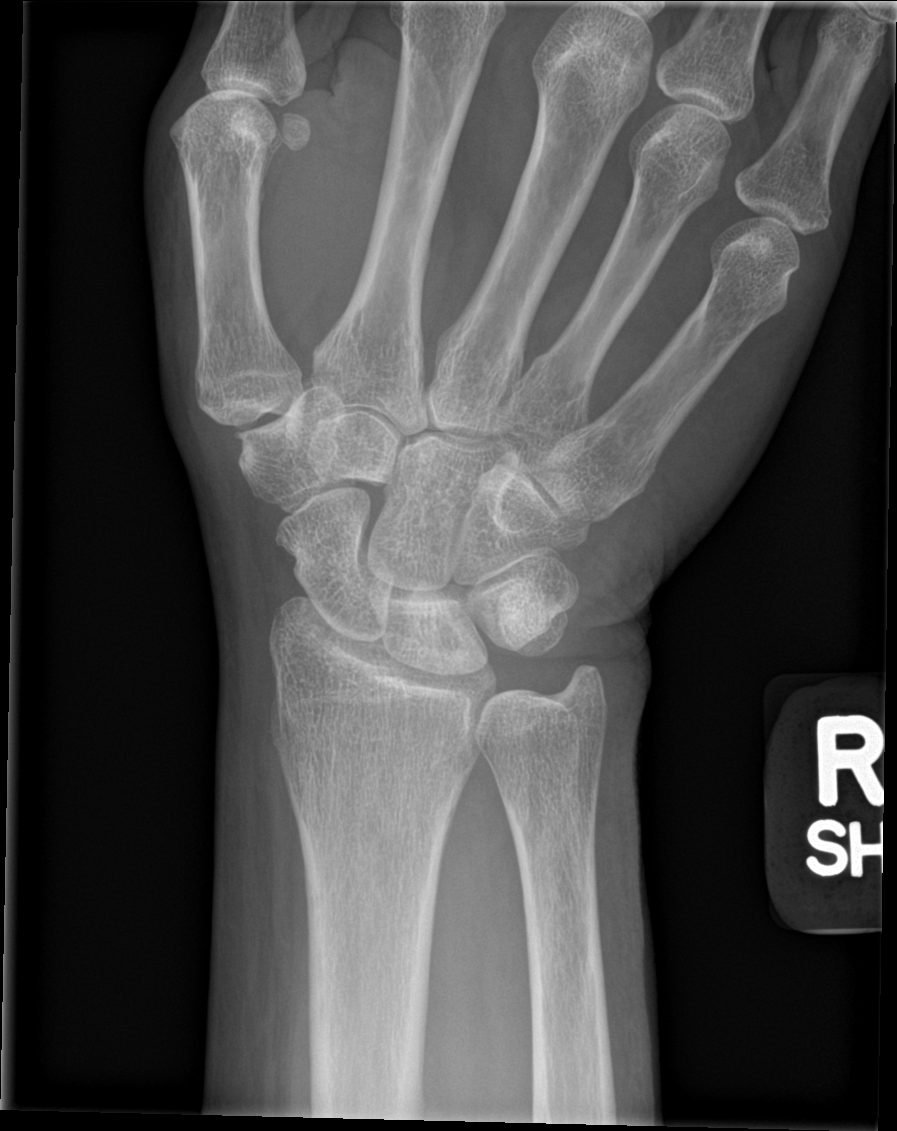

[4 of 4 positions shown; findings below may reference images not displayed]

FINDINGS: There is no evidence of fracture or dislocation. There is no
evidence of arthropathy or other focal bone abnormality. Soft
tissues are unremarkable.
IMPRESSION: No acute abnormality noted.

## 2021-12-13 ENCOUNTER — Other Ambulatory Visit: Payer: Self-pay | Admitting: Cardiovascular Disease

## 2021-12-25 ENCOUNTER — Other Ambulatory Visit: Payer: Self-pay | Admitting: Cardiovascular Disease

## 2021-12-30 ENCOUNTER — Encounter: Payer: Self-pay | Admitting: Neurology

## 2021-12-30 ENCOUNTER — Ambulatory Visit (INDEPENDENT_AMBULATORY_CARE_PROVIDER_SITE_OTHER): Payer: Medicare Other | Admitting: Neurology

## 2021-12-30 VITALS — BP 156/76 | HR 76 | Ht 66.0 in | Wt 175.2 lb

## 2021-12-30 DIAGNOSIS — R269 Unspecified abnormalities of gait and mobility: Secondary | ICD-10-CM | POA: Diagnosis not present

## 2021-12-30 DIAGNOSIS — W19XXXA Unspecified fall, initial encounter: Secondary | ICD-10-CM

## 2021-12-30 DIAGNOSIS — Z8679 Personal history of other diseases of the circulatory system: Secondary | ICD-10-CM

## 2021-12-30 DIAGNOSIS — I25118 Atherosclerotic heart disease of native coronary artery with other forms of angina pectoris: Secondary | ICD-10-CM

## 2021-12-30 DIAGNOSIS — G35 Multiple sclerosis: Secondary | ICD-10-CM | POA: Diagnosis not present

## 2021-12-30 NOTE — Progress Notes (Signed)
[facility]  PATIENT: Daisy Bennett DOB: 12-Dec-1955  REFERRING DOCTOR OR PCP: Howard Pouch DO SOURCE: Patient, notes from Dr. Jannifer [place], imaging and lab reports, MRI images personally reviewed.  _________________________________   HISTORICAL  CHIEF COMPLAINT:  Chief Complaint  Patient presents with   Follow-up    RM 1, alone. Last seen 08/12/21. Off DMT for MS. No concerns, doing well. Denies any new sx.    HISTORY OF PRESENT ILLNESS:  I had the pleasure of seeing Daisy Bennett at the [facility] at [facility] for a neurologic transfer care regarding her multiple sclerosis.  She had been a patient of Dr. Jannifer [place] in the past.   She was diagnosed with MS in the 1990s after presenting with hearing loss followed by numbness from the waist down.  She is currently not on any disease modifying therapy.  Currently, her gait  and balance are mildly reduced but she walks without support.     She had a trip/fall last week but this was first fall in a couple years.    She had done PT in past for her balance.   She had another fall in 2017 causing a falcine subdural hematoma.    She no longer has left leg numbness but sensation is mildly altered.    Vision is doing well.   Bladder function is ok though she has occasional urgency.       She notes some fatigue but this usually does not limit her ability to do what she sets out to do.  She usually sleeps well.  She denies depression or major difficulties with cognition.   MS History The patient was diagnosed with multiple sclerosis at [facility] in the 1990s after presenting with left hearing loss.   She had an MRI performed worrisome for MS.   She then had left numbness form the waist down and was officially diagnosed.   She was placed on Betaseron and continued until 2015.  She was seen by Dr. Janann Colonel through our office in 2014 and Dr. Jannifer [place] between 2015 and 2022.  She had been on Tecfidera but was noted  to have low absolute lymphocyte counts and was switched over to Aubagio.  Due to diarrhea, she stopped in September 2022.    She is on metformin but this medication predates the onset of her diarrhea.  IMAGING: MRI 10/13/2020 Multiple T2/FLAIR hyperintense foci, predominantly in the periventricular and juxtacortical white matter of the hemispheres. One focus in pons. These are consistent with chronic demyelinating plaque associated with multiple sclerosis.  None of the foci appear to be acute.  They do not enhance.  Compared to the MRI from 02/20/2019, there are no new lesions.  Plaque burden is low for MS > 25 years.    MRI cervical spine 05/10/2013 was normal by report.    REVIEW OF SYSTEMS: Constitutional: No fevers, chills, sweats, or change in appetite Eyes: No visual changes, double vision, eye pain Ear, nose and throat: No hearing loss, ear pain, nasal congestion, sore throat Cardiovascular: No chest pain, palpitations Respiratory:  No shortness of breath at rest or with exertion.   No wheezes GastrointestinaI: No nausea, vomiting, diarrhea, abdominal pain, fecal incontinence Genitourinary:  No dysuria, urinary retention or frequency.  No nocturia. Musculoskeletal:  No neck pain, back pain Integumentary: No rash, pruritus, skin lesions Neurological: as above Psychiatric: No depression at this time.  No anxiety Endocrine: No palpitations, diaphoresis, change in appetite, change in weigh or increased  thirst Hematologic/Lymphatic:  No anemia, purpura, petechiae. Allergic/Immunologic: No itchy/runny eyes, nasal congestion, recent allergic reactions, rashes  ALLERGIES: Allergies  Allergen Reactions   Aubagio [Teriflunomide] Diarrhea    Pancreatitis, neuropathy, elevated glucose   Betaseron [Interferon Beta-1b] Other (See Comments)    Increased LFT's   Provigil [Modafinil] Swelling    Tongue swelling and sores   Topamax [Topiramate]     Cognitive slowing   Doxycycline Other (See  Comments)    Thrush,dizziness    HOME MEDICATIONS:  Current Outpatient Medications:    amLODipine (NORVASC) 10 MG tablet, TAKE 1 TABLET BY MOUTH EVERY DAY, Disp: 90 tablet, Rfl: 3   B Complex Vitamins (B COMPLEX-B12 PO), Take by mouth., Disp: , Rfl:    carvedilol (COREG) 12.5 MG tablet, Take 1 tablet (12.5 mg total) by mouth 2 (two) times daily., Disp: 180 tablet, Rfl: 1   cholecalciferol (VITAMIN D) 1000 units tablet, Take 1,000 Units by mouth daily., Disp: , Rfl:    fenofibrate 160 MG tablet, Take 1 tablet (160 mg total) by mouth daily., Disp: 90 tablet, Rfl: 3   Ferrous Sulfate (IRON) 325 (65 FE) MG TABS, Take 325 mg by mouth daily., Disp: , Rfl:    fish oil-omega-3 fatty acids 1000 MG capsule, Take 1 g by mouth daily., Disp: , Rfl:    glimepiride (AMARYL) 4 MG tablet, 1 tab BID, Disp: 180 tablet, Rfl: 1   hydrochlorothiazide (HYDRODIURIL) 50 MG tablet, Take 1 tablet (50 mg total) by mouth daily., Disp: 90 tablet, Rfl: 1   levothyroxine (SYNTHROID) 112 MCG tablet, Take 1 tablet (112 mcg total) by mouth daily., Disp: 90 tablet, Rfl: 3   losartan (COZAAR) 100 MG tablet, Take 1 tablet (100 mg total) by mouth daily. Call and schedule an appointment for further refills to be granted 2nd attempt, Disp: 14 tablet, Rfl: 0   metFORMIN (GLUCOPHAGE) 1000 MG tablet, Take 1 tablet (1,000 mg total) by mouth daily with breakfast., Disp: 90 tablet, Rfl: 1   mirtazapine (REMERON) 15 MG tablet, Take 1 tablet (15 mg total) by mouth at bedtime., Disp: 90 tablet, Rfl: 1   nitroGLYCERIN (NITROSTAT) 0.4 MG SL tablet, Place 1 tablet (0.4 mg total) under the tongue every 5 (five) minutes x 3 doses as needed for chest pain., Disp: 25 tablet, Rfl: 4   pantoprazole (PROTONIX) 20 MG tablet, Take 1 tablet (20 mg total) by mouth daily., Disp: 90 tablet, Rfl: 1   potassium chloride (KLOR-CON) 10 MEQ tablet, Take 1 tablet (10 mEq total) by mouth daily., Disp: 30 tablet, Rfl: 11   prasugrel (EFFIENT) 10 MG TABS tablet, TAKE  1 TABLET BY MOUTH EVERY DAY, Disp: 30 tablet, Rfl: 0   rosuvastatin (CRESTOR) 20 MG tablet, Take 1 tablet (20 mg total) by mouth daily., Disp: 90 tablet, Rfl: 3   venlafaxine XR (EFFEXOR XR) 150 MG 24 hr capsule, Take 1 capsule (150 mg total) by mouth daily with breakfast., Disp: 90 capsule, Rfl: 1  PAST MEDICAL HISTORY: Past Medical History:  Diagnosis Date   Basal cell carcinoma    Brain bleed ([place]) 2017   COVID-19 08/2019   DM (diabetes mellitus) ([place])    Former smoker    Glossitis, benign migratory 08/31/2019   Goiter, unspecified    Heart murmur    History of colonic polyps 2012   Multiple sclerosis ([place])    Non-ST elevated myocardial infarction (non-STEMI) ([place])    Olecranon bursitis of left elbow 10/17/2017   Oral mucositis 08/31/2019   SCCA (  squamous cell carcinoma) of skin    right shin   SEMI (subendocardial myocardial infarction) ([place]) 09/29/2009   TOTAL RCA WITH STENT TO OM BRANCH   Shingles    Squamous cell carcinoma 2019   lower ext   Traumatic rupture of volar plate of left ring finger 03/25/2020   Unspecified essential hypertension    Unspecified hypothyroidism     PAST SURGICAL HISTORY: Past Surgical History:  Procedure Laterality Date   CATARACT SURGERY  07/2009   CESAREAN SECTION     CORONARY ANGIOPLASTY WITH STENT PLACEMENT  2011   HAND SURGERY Left 09/2021   tendon surgery   KNEE SURGERY     leg surgery     squamous ccell removed    TENOSYNOVECTOMY Left 10/26/2021   Procedure: LEFT WRIST EXTENSOR TENOSYNOVECTOMY;  Surgeon: Leanora Cover, MD;  Location: [place];  Service: Orthopedics;  Laterality: Left;  58 MIN   TONSILLECTOMY     VESICOVAGINAL FISTULA CLOSURE W/ TAH      FAMILY HISTORY: Family History  Problem Relation Age of Onset   Hypertension Mother    Heart failure Father    Diabetes Father    Aneurysm Brother        THORACIC/ABD ANEURYSM   Prostate cancer Brother 51   Diabetes Brother    Fibromyalgia Sister     Brain cancer Maternal Grandmother 39   Multiple sclerosis Neg Hx    Colon cancer Neg Hx    Esophageal cancer Neg Hx     SOCIAL HISTORY:  Social History   Socioeconomic History   Marital status: Married    Spouse name: Not on file   Number of children: 2   Years of education: 14   Highest education level: Associate degree: academic program  Occupational History   Occupation: Therapist, music  Tobacco Use   Smoking status: Former    Packs/day: 1.00    Years: 15.00    Pack years: 15.00    Types: Cigarettes    Quit date: 12/16/2015    Years since quitting: 6.0   Smokeless tobacco: Never   Tobacco comments:    on and off smoker  Vaping Use   Vaping Use: Never used  Substance and Sexual Activity   Alcohol use: Yes    Alcohol/week: 0.0 standard drinks    Comment: OCCASIONAL ALCOHOL USE.   Drug use: No   Sexual activity: Yes  Other Topics Concern   Not on file  Social History Narrative   The patient lives with husband. 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    Patient drinks occasionally, does not chew tobacco, does not use recreational drugs. She does drink occasional caffeine. She does wear her seatbelt. She does wear bike helmet riding a bike. She does exercise 3 times a week. She does not wear hearing aids or dentures. There is a smoke alarm in her home. There are no firearms in her home. She feels safe in her relationship. She has never experienced physical abuse.   Patient sleeps 7-8 hours a night.   Patient drinks 2 cups of caffeine daily.   Patient is right handed.   Social Determinants of Health   Financial Resource Strain: Low Risk    Difficulty of Paying Living Expenses: Not hard at all  Food Insecurity: No Food Insecurity   Worried About Charity fundraiser in the Last Year: Never true   [place] in the Last Year: Never  true  Transportation Needs: No Transportation Needs   Lack of Transportation (Medical): No   Lack  of Transportation (Non-Medical): No  Physical Activity: Inactive   Days of Exercise per Week: 0 days   Minutes of Exercise per Session: 0 min  Stress: No Stress Concern Present   Feeling of Stress : Not at all  Social Connections: Moderately Integrated   Frequency of Communication with Friends and Family: More than three times a week   Frequency of Social Gatherings with Friends and Family: More than three times a week   Attends Religious Services: Never   Marine scientist or Organizations: Yes   Attends Archivist Meetings: 1 to 4 times per year   Marital Status: Married  Human resources officer Violence: Not At Risk   Fear of Current or Ex-Partner: No   Emotionally Abused: No   Physically Abused: No   Sexually Abused: No     PHYSICAL EXAM  Vitals:   12/30/21 1130  BP: (!) 156/76  Pulse: 76  Weight: 175 lb 3.2 oz (79.5 kg)  Height: '5\' 6"'$  (1.676 m)    Body mass index is 28.28 kg/m.   General: The patient is well-developed and well-nourished and in no acute distress  HEENT:  Head is [place]/AT.  Sclera are anicteric.    Neck: No carotid bruits are noted.  The neck is nontender.  Cardiovascular: The heart has a regular rate and rhythm with a normal S1 and S2. There were no murmurs, gallops or rubs.    Skin: Extremities are without rash or  edema.  Musculoskeletal:  Back is nontender  Neurologic Exam  Mental status: The patient is alert and oriented x 3 at the time of the examination. The patient has apparent normal recent and remote memory, with an apparently normal attention span and concentration ability.   Speech is normal.  Cranial nerves: Extraocular movements are full. Pupils are equal, round, and reactive to light and accomodation.   Color vision was symmetric there is good facial sensation to soft touch bilaterally.Facial strength is normal.  Trapezius and sternocleidomastoid strength is normal. No dysarthria is noted.  The tongue is midline, and the  patient has symmetric elevation of the soft palate. No obvious hearing deficits are noted.  Motor:  Muscle bulk is normal.   Tone is normal. Strength is  5 / 5 in all 4 extremities.   Sensory: Sensory testing is intact to pinprick, soft touch and vibration sensation in all 4 extremities.  Coordination: Cerebellar testing reveals good finger-nose-finger and heel-to-shin bilaterally.  Gait and station: Station is normal.   Gait is normal. Tandem gait is wide. Romberg is negative.   Reflexes: Deep tendon reflexes are symmetric and normal bilaterally.   Plantar responses are flexor.    DIAGNOSTIC DATA (LABS, IMAGING, TESTING) - I reviewed patient records, labs, notes, testing and imaging myself where available.  Lab Results  Component Value Date   WBC 8.5 09/28/2021   HGB 12.0 09/28/2021   HCT 35.8 (L) 09/28/2021   MCV 96.8 09/28/2021   PLT 140.0 (L) 09/28/2021      Component Value Date/Time   NA 138 09/28/2021 1040   NA 135 09/22/2020 1351   K 3.5 09/28/2021 1040   CL 99 09/28/2021 1040   CO2 30 09/28/2021 1040   GLUCOSE 187 (H) 09/28/2021 1040   BUN 20 09/28/2021 1040   BUN 17 09/22/2020 1351   CREATININE 1.39 (H) 09/28/2021 1040   CREATININE 1.19 (H) 07/04/2019  3546   CALCIUM 9.8 09/28/2021 1040   PROT 7.0 03/23/2021 1108   ALBUMIN 4.2 03/23/2021 1108   AST 26 03/23/2021 1108   ALT 22 03/23/2021 1108   ALKPHOS 58 03/23/2021 1108   BILITOT 0.4 03/23/2021 1108   GFRNONAA 67 09/22/2020 1351   GFRAA 77 09/22/2020 1351   Lab Results  Component Value Date   CHOL 168 09/28/2021   HDL 45.80 09/28/2021   LDLCALC 73 07/04/2019   LDLDIRECT 90.0 09/28/2021   TRIG 266.0 (H) 09/28/2021   CHOLHDL 4 09/28/2021   Lab Results  Component Value Date   HGBA1C 7.4 (H) 09/28/2021   Lab Results  Component Value Date   FKCLEXNT70 017 07/22/2015   Lab Results  Component Value Date   TSH 0.58 09/28/2021       ASSESSMENT AND PLAN  Multiple sclerosis ([place])  Fall, initial  encounter  History of subdural hematoma  Gait disturbance    Her MS has been stable.  She will remain off a DMT and last took [facility] September 2020.   We will recheck an MRI around the end of 2023 to determine if breakthrough activity and consider a DMT (maybe Mavenclad) if this is occurring.     Stay active and exercise as tolerated.   Rtc 6 months or sooner if new or worsening symptoms  41-minute office visit with the majority of the time spent face-to-face for history and physical, discussion/counseling and decision-making.  Additional time with record review and documentation.   Albion Weatherholtz A. Felecia Shelling, MD, [facility] 4/94/4967, 59:16 PM Certified in Neurology, Clinical Neurophysiology, Sleep Medicine and Neuroimaging  [facility] [address], [place], [place] [address] [phone]

## 2022-01-04 ENCOUNTER — Other Ambulatory Visit: Payer: Self-pay

## 2022-01-04 ENCOUNTER — Emergency Department (HOSPITAL_BASED_OUTPATIENT_CLINIC_OR_DEPARTMENT_OTHER): Payer: Medicare Other

## 2022-01-04 ENCOUNTER — Encounter (HOSPITAL_BASED_OUTPATIENT_CLINIC_OR_DEPARTMENT_OTHER): Payer: Self-pay | Admitting: Emergency Medicine

## 2022-01-04 ENCOUNTER — Emergency Department (HOSPITAL_BASED_OUTPATIENT_CLINIC_OR_DEPARTMENT_OTHER)
Admission: EM | Admit: 2022-01-04 | Discharge: 2022-01-05 | Disposition: A | Discharge status: Home / Self Care | Payer: Medicare Other | Attending: Emergency Medicine | Admitting: Emergency Medicine

## 2022-01-04 DIAGNOSIS — W01198A Fall on same level from slipping, tripping and stumbling with subsequent striking against other object, initial encounter: Secondary | ICD-10-CM | POA: Diagnosis not present

## 2022-01-04 DIAGNOSIS — N189 Chronic kidney disease, unspecified: Secondary | ICD-10-CM | POA: Insufficient documentation

## 2022-01-04 DIAGNOSIS — S62636A Displaced fracture of distal phalanx of right little finger, initial encounter for closed fracture: Secondary | ICD-10-CM | POA: Diagnosis not present

## 2022-01-04 DIAGNOSIS — Z79899 Other long term (current) drug therapy: Secondary | ICD-10-CM | POA: Insufficient documentation

## 2022-01-04 DIAGNOSIS — E1122 Type 2 diabetes mellitus with diabetic chronic kidney disease: Secondary | ICD-10-CM | POA: Diagnosis not present

## 2022-01-04 DIAGNOSIS — R220 Localized swelling, mass and lump, head: Secondary | ICD-10-CM | POA: Diagnosis not present

## 2022-01-04 DIAGNOSIS — S0990XA Unspecified injury of head, initial encounter: Secondary | ICD-10-CM | POA: Diagnosis not present

## 2022-01-04 DIAGNOSIS — I129 Hypertensive chronic kidney disease with stage 1 through stage 4 chronic kidney disease, or unspecified chronic kidney disease: Secondary | ICD-10-CM | POA: Diagnosis not present

## 2022-01-04 DIAGNOSIS — M19041 Primary osteoarthritis, right hand: Secondary | ICD-10-CM | POA: Diagnosis not present

## 2022-01-04 DIAGNOSIS — Z7984 Long term (current) use of oral hypoglycemic drugs: Secondary | ICD-10-CM | POA: Insufficient documentation

## 2022-01-04 DIAGNOSIS — W19XXXA Unspecified fall, initial encounter: Secondary | ICD-10-CM

## 2022-01-04 NOTE — Discharge Instructions (Addendum)
You can use Tylenol up to 1000 mg every 6 hours for pain.  Please keep your finger in the splint that we are applying today.  I recommend that you call either your hand surgeon or the surgeon whose contact information I provided them to set up evaluation for your fracture.   If your hand surgeon is not in our health network I have attached the exact x-ray read for his information " Mildly displaced intra-articular fracture of the dorsal base of the  right fifth distal phalanx.  "

## 2022-01-04 NOTE — ED Triage Notes (Signed)
Patient states she tripped and fell 5 hours pta and hit the right side of her head. Denies LOC. Patient is on Effient.

## 2022-01-04 NOTE — ED Provider Notes (Signed)
[facility] Provider Note   CSN: [id number] Arrival date & time: 01/04/22  2145     History  Chief Complaint  Patient presents with   Head Injury    Daisy Bennett is a 66 y.o. female history significant for hypertension, multiple sclerosis, type 2 diabetes, CKD, previous intracranial bleed secondary to blood thinners who presents with concern for tripping and falling 5 hours prior to arrival and hitting the right side of her head.  She denies any loss of consciousness.  She reports a very dull headache, no nausea, vomiting, vision changes, light conservative.  She also endorses right pinky injury.  She denies any weakness, numbness, tingling.  She reports she probably would not have come in other than her history of brain bleeds and blood thinners.   Head Injury     Home Medications Prior to Admission medications   Medication Sig Start Date End Date Taking? Authorizing Provider  amLODipine (NORVASC) 10 MG tablet TAKE 1 TABLET BY MOUTH EVERY DAY 01/21/21   Josue Hector, MD  B Complex Vitamins (B COMPLEX-B12 PO) Take by mouth.    [provider]  carvedilol (COREG) 12.5 MG tablet Take 1 tablet (12.5 mg total) by mouth 2 (two) times daily. 09/28/21   Kuneff, Renee A, DO  cholecalciferol (VITAMIN D) 1000 units tablet Take 1,000 Units by mouth daily.    [provider]  fenofibrate 160 MG tablet Take 1 tablet (160 mg total) by mouth daily. 09/28/21   Kuneff, Renee A, DO  Ferrous Sulfate (IRON) 325 (65 FE) MG TABS Take 325 mg by mouth daily.    [provider]  fish oil-omega-3 fatty acids 1000 MG capsule Take 1 g by mouth daily.    [provider]  glimepiride (AMARYL) 4 MG tablet 1 tab BID 09/28/21   Kuneff, Renee A, DO  hydrochlorothiazide (HYDRODIURIL) 50 MG tablet Take 1 tablet (50 mg total) by mouth daily. 09/28/21   Kuneff, Renee A, DO  levothyroxine (SYNTHROID) 112 MCG tablet Take 1 tablet (112 mcg total) by mouth  daily. 09/29/21   Kuneff, Renee A, DO  losartan (COZAAR) 100 MG tablet Take 1 tablet (100 mg total) by mouth daily. Call and schedule an appointment for further refills to be granted 2nd attempt 12/14/21   Josue Hector, MD  metFORMIN (GLUCOPHAGE) 1000 MG tablet Take 1 tablet (1,000 mg total) by mouth daily with breakfast. 09/28/21   Kuneff, Renee A, DO  mirtazapine (REMERON) 15 MG tablet Take 1 tablet (15 mg total) by mouth at bedtime. 09/28/21   Kuneff, Renee A, DO  nitroGLYCERIN (NITROSTAT) 0.4 MG SL tablet Place 1 tablet (0.4 mg total) under the tongue every 5 (five) minutes x 3 doses as needed for chest pain. 11/17/20 07/19/22  Josue Hector, MD  pantoprazole (PROTONIX) 20 MG tablet Take 1 tablet (20 mg total) by mouth daily. 09/28/21   Kuneff, Renee A, DO  potassium chloride (KLOR-CON) 10 MEQ tablet Take 1 tablet (10 mEq total) by mouth daily. 09/28/21   Kuneff, Renee A, DO  prasugrel (EFFIENT) 10 MG TABS tablet TAKE 1 TABLET BY MOUTH EVERY DAY 12/02/21   Josue Hector, MD  rosuvastatin (CRESTOR) 20 MG tablet Take 1 tablet (20 mg total) by mouth daily. 09/29/21   Kuneff, Renee A, DO  venlafaxine XR (EFFEXOR XR) 150 MG 24 hr capsule Take 1 capsule (150 mg total) by mouth daily with breakfast. 09/28/21   Raoul Pitch, Renee A, DO  Allergies    Aubagio [teriflunomide], Betaseron [interferon beta-1b], Provigil [modafinil], Topamax [topiramate], and Doxycycline    Review of Systems   Review of Systems  Musculoskeletal:  Positive for arthralgias.  All other systems reviewed and are negative.  Physical Exam Updated Vital Signs BP (!) 182/64   Pulse 77   Temp 98.3 F (36.8 C) (Oral)   Resp 18   Ht '5\' 6"'$  (1.676 m)   Wt 77.1 kg   SpO2 97%   BMI 27.44 kg/m  Physical Exam Vitals and nursing note reviewed.  Constitutional:      General: She is not in acute distress.    Appearance: Normal appearance.  HENT:     Head: Normocephalic and atraumatic.  Eyes:     General:        Right eye: No  discharge.        Left eye: No discharge.  Cardiovascular:     Rate and Rhythm: Normal rate and regular rhythm.     Heart sounds: No murmur heard.   No friction rub. No gallop.  Pulmonary:     Effort: Pulmonary effort is normal.     Breath sounds: Normal breath sounds.  Abdominal:     General: Bowel sounds are normal.     Palpations: Abdomen is soft.  Musculoskeletal:     Comments: Patient with swelling to the right fifth digit with some redness, tenderness palpation, and decreased range of motion.  No obvious step-off or deformity but significant swelling noted.  Skin:    General: Skin is warm and dry.     Capillary Refill: Capillary refill takes less than 2 seconds.     Comments: Some tenderness to palpation of the right frontal scalp without step-off or deformity or laceration  Neurological:     Mental Status: She is alert and oriented to person, place, and time.  Psychiatric:        Mood and Affect: Mood normal.        Behavior: Behavior normal.    ED Results / Procedures / Treatments   Labs (all labs ordered are listed, but only abnormal results are displayed) Labs Reviewed - No data to display  EKG None  Radiology CT Head Wo Contrast  Result Date: 01/04/2022 CLINICAL DATA:  Status post fall. EXAM: CT HEAD WITHOUT CONTRAST TECHNIQUE: Contiguous axial images were obtained from the base of the skull through the vertex without intravenous contrast. RADIATION DOSE REDUCTION: This exam was performed according to the departmental dose-optimization program which includes automated exposure control, adjustment of the mA and/or kV according to patient size and/or use of iterative reconstruction technique. COMPARISON:  February 21, 2020 FINDINGS: Brain: No evidence of acute infarction, hemorrhage, hydrocephalus, extra-axial collection or mass lesion/mass effect. Vascular: No hyperdense vessel or unexpected calcification. Skull: Normal. Negative for fracture or focal lesion. Sinuses/Orbits:  No acute finding. Other: There is mild right parietal scalp soft tissue swelling. IMPRESSION: 1. No acute intracranial abnormality. 2. Mild right parietal scalp soft tissue swelling without an underlying fracture. Electronically Signed   By: Virgina Norfolk M.D.   On: 01/04/2022 22:43   DG Hand Complete Right  Result Date: 01/04/2022 CLINICAL DATA:  Fall EXAM: RIGHT HAND - COMPLETE 3+ VIEW COMPARISON:  10/17/2021 FINDINGS: Mildly displaced intra-articular fracture of the dorsal base of the right fifth distal phalanx. No other fracture. Mild osteoarthrosis of the right index finger distal interphalangeal joint. IMPRESSION: Mildly displaced intra-articular fracture of the dorsal base of the right fifth distal phalanx. Electronically  Signed   By: Ulyses Jarred M.D.   On: 01/04/2022 23:34    Procedures Procedures    Medications Ordered in ED Medications - No data to display  ED Course/ Medical Decision Making/ A&P                           Medical Decision Making Amount and/or Complexity of Data Reviewed Radiology: ordered.   This patient is a 66 y.o. female who presents to the ED for concern of fall, head injury, as well as right hand and pinky injury, this involves an extensive number of treatment options, and is a complaint that carries with it a high risk of complications and morbidity. The emergent differential diagnosis prior to evaluation includes, but is not limited to, intracranial bleed, fracture, dislocation of the finger, fracture of the skull, concussion versus other.   This is not an exhaustive differential.   Past Medical History / Co-morbidities / Social History: Previous history of subdural hematoma, patient does not take blood thinners, she also has history of MS.  No history of cardiogenic syncope,  Additional history: Chart reviewed. Pertinent results include: Reviewed lab work and imaging from recent previous emergency department visits  Physical Exam: Physical exam  performed. The pertinent findings include: Patient with no neurologic deficits on my exam.  She has some tenderness palpation of the right frontal skull without step-off or deformity or bruising.  Patient with significant deformity noted to right distal fifth phalanx with redness, swelling.  She is tender to palpation over the middle to distal interphalangeal joint   Imaging Studies: I ordered imaging studies including CT head without contrast, plain film x-ray of the right hand. I independently visualized and interpreted imaging which showed no evidence of intracranial abnormality, fracture line of the proximal aspect of the distal fifth phalanx on the right hand with intra-articular involvement.  Findings appear consistent with an avulsion fracture. I agree with the radiologist interpretation.   Disposition: After consideration of the diagnostic results and the patients response to treatment, I feel that patient shows no signs and symptoms of acute intracranial injury, concussion, however she does have an intra-articular fracture of her fifth distal phalanx.  After discussion with maintaining physician we will splint in extension and have patient follow-up with surgeon for likely pinning of the affected digit this week.  Patient reports that she has a Psychologist, sport and exercise, reports she can follow-up with her own hand surgeon if she prefers.  We will provide Dr. Dierdre Highman contact information as he is on-call for hand at this time.   I discussed this case with my attending physician Dr. Roxanne Mins who cosigned this note including patient's presenting symptoms, physical exam, and planned diagnostics and interventions. Attending physician stated agreement with plan or made changes to plan which were implemented.    Final Clinical Impression(s) / ED Diagnoses Final diagnoses:  Displaced fracture of distal phalanx of right little finger, initial encounter for closed fracture  Fall, initial encounter  Injury of head, initial  encounter    Rx / DC Orders ED Discharge Orders     None         Dorien Chihuahua 65/99/35 [id number]    Delora Fuel, MD 79/39/03 [phone]

## 2022-01-05 NOTE — ED Notes (Signed)
Pt verbalizes understanding of discharge instructions. Opportunity for questioning and answers were provided. Pt discharged from ED to home.   ? ?

## 2022-01-12 DIAGNOSIS — S62647A Nondisplaced fracture of proximal phalanx of left little finger, initial encounter for closed fracture: Secondary | ICD-10-CM | POA: Diagnosis not present

## 2022-01-12 DIAGNOSIS — S62636A Displaced fracture of distal phalanx of right little finger, initial encounter for closed fracture: Secondary | ICD-10-CM | POA: Diagnosis not present

## 2022-01-12 DIAGNOSIS — M79645 Pain in left finger(s): Secondary | ICD-10-CM | POA: Diagnosis not present

## 2022-01-19 DIAGNOSIS — S62647D Nondisplaced fracture of proximal phalanx of left little finger, subsequent encounter for fracture with routine healing: Secondary | ICD-10-CM | POA: Diagnosis not present

## 2022-01-19 DIAGNOSIS — S62636A Displaced fracture of distal phalanx of right little finger, initial encounter for closed fracture: Secondary | ICD-10-CM | POA: Diagnosis not present

## 2022-01-19 DIAGNOSIS — S62636D Displaced fracture of distal phalanx of right little finger, subsequent encounter for fracture with routine healing: Secondary | ICD-10-CM | POA: Diagnosis not present

## 2022-01-23 ENCOUNTER — Other Ambulatory Visit: Payer: Self-pay | Admitting: Cardiovascular Disease

## 2022-01-26 DIAGNOSIS — M25642 Stiffness of left hand, not elsewhere classified: Secondary | ICD-10-CM | POA: Diagnosis not present

## 2022-01-26 DIAGNOSIS — M79645 Pain in left finger(s): Secondary | ICD-10-CM | POA: Diagnosis not present

## 2022-01-26 DIAGNOSIS — S62647A Nondisplaced fracture of proximal phalanx of left little finger, initial encounter for closed fracture: Secondary | ICD-10-CM | POA: Diagnosis not present

## 2022-01-26 DIAGNOSIS — S62636A Displaced fracture of distal phalanx of right little finger, initial encounter for closed fracture: Secondary | ICD-10-CM | POA: Diagnosis not present

## 2022-01-26 DIAGNOSIS — M25641 Stiffness of right hand, not elsewhere classified: Secondary | ICD-10-CM | POA: Diagnosis not present

## 2022-02-08 ENCOUNTER — Other Ambulatory Visit: Payer: Self-pay | Admitting: Cardiovascular Disease

## 2022-02-10 DIAGNOSIS — S62636A Displaced fracture of distal phalanx of right little finger, initial encounter for closed fracture: Secondary | ICD-10-CM | POA: Diagnosis not present

## 2022-02-10 DIAGNOSIS — M79645 Pain in left finger(s): Secondary | ICD-10-CM | POA: Diagnosis not present

## 2022-02-10 DIAGNOSIS — S62647A Nondisplaced fracture of proximal phalanx of left little finger, initial encounter for closed fracture: Secondary | ICD-10-CM | POA: Diagnosis not present

## 2022-02-10 DIAGNOSIS — S62647D Nondisplaced fracture of proximal phalanx of left little finger, subsequent encounter for fracture with routine healing: Secondary | ICD-10-CM | POA: Diagnosis not present

## 2022-02-10 DIAGNOSIS — S62636D Displaced fracture of distal phalanx of right little finger, subsequent encounter for fracture with routine healing: Secondary | ICD-10-CM | POA: Diagnosis not present

## 2022-02-17 DIAGNOSIS — H04123 Dry eye syndrome of bilateral lacrimal glands: Secondary | ICD-10-CM | POA: Diagnosis not present

## 2022-02-17 DIAGNOSIS — E119 Type 2 diabetes mellitus without complications: Secondary | ICD-10-CM | POA: Diagnosis not present

## 2022-02-17 DIAGNOSIS — H43811 Vitreous degeneration, right eye: Secondary | ICD-10-CM | POA: Diagnosis not present

## 2022-02-17 DIAGNOSIS — H40013 Open angle with borderline findings, low risk, bilateral: Secondary | ICD-10-CM | POA: Diagnosis not present

## 2022-02-17 DIAGNOSIS — H40019 Open angle with borderline findings, low risk, unspecified eye: Secondary | ICD-10-CM | POA: Diagnosis not present

## 2022-02-24 DIAGNOSIS — M25641 Stiffness of right hand, not elsewhere classified: Secondary | ICD-10-CM | POA: Diagnosis not present

## 2022-02-24 DIAGNOSIS — M25642 Stiffness of left hand, not elsewhere classified: Secondary | ICD-10-CM | POA: Diagnosis not present

## 2022-02-24 DIAGNOSIS — M79645 Pain in left finger(s): Secondary | ICD-10-CM | POA: Diagnosis not present

## 2022-02-24 DIAGNOSIS — S62636A Displaced fracture of distal phalanx of right little finger, initial encounter for closed fracture: Secondary | ICD-10-CM | POA: Diagnosis not present

## 2022-03-08 ENCOUNTER — Other Ambulatory Visit: Payer: Self-pay | Admitting: Cardiovascular Disease

## 2022-03-10 DIAGNOSIS — S62636D Displaced fracture of distal phalanx of right little finger, subsequent encounter for fracture with routine healing: Secondary | ICD-10-CM | POA: Diagnosis not present

## 2022-03-31 ENCOUNTER — Other Ambulatory Visit: Payer: Self-pay | Admitting: Cardiovascular Disease

## 2022-04-04 ENCOUNTER — Other Ambulatory Visit: Payer: Self-pay | Admitting: Cardiovascular Disease

## 2022-04-07 ENCOUNTER — Other Ambulatory Visit: Payer: Self-pay | Admitting: Family Medicine

## 2022-04-07 DIAGNOSIS — E118 Type 2 diabetes mellitus with unspecified complications: Secondary | ICD-10-CM

## 2022-04-13 ENCOUNTER — Ambulatory Visit (INDEPENDENT_AMBULATORY_CARE_PROVIDER_SITE_OTHER): Payer: Medicare Other | Admitting: Family Medicine

## 2022-04-13 ENCOUNTER — Encounter: Payer: Self-pay | Admitting: Family Medicine

## 2022-04-13 VITALS — BP 127/61 | HR 55 | Temp 98.0°F | Ht 66.0 in | Wt 173.0 lb

## 2022-04-13 DIAGNOSIS — I25118 Atherosclerotic heart disease of native coronary artery with other forms of angina pectoris: Secondary | ICD-10-CM

## 2022-04-13 DIAGNOSIS — I10 Essential (primary) hypertension: Secondary | ICD-10-CM

## 2022-04-13 DIAGNOSIS — E1169 Type 2 diabetes mellitus with other specified complication: Secondary | ICD-10-CM

## 2022-04-13 DIAGNOSIS — E034 Atrophy of thyroid (acquired): Secondary | ICD-10-CM | POA: Diagnosis not present

## 2022-04-13 DIAGNOSIS — E785 Hyperlipidemia, unspecified: Secondary | ICD-10-CM

## 2022-04-13 DIAGNOSIS — Z23 Encounter for immunization: Secondary | ICD-10-CM

## 2022-04-13 DIAGNOSIS — G35 Multiple sclerosis: Secondary | ICD-10-CM

## 2022-04-13 DIAGNOSIS — D6869 Other thrombophilia: Secondary | ICD-10-CM | POA: Diagnosis not present

## 2022-04-13 DIAGNOSIS — E118 Type 2 diabetes mellitus with unspecified complications: Secondary | ICD-10-CM

## 2022-04-13 DIAGNOSIS — F330 Major depressive disorder, recurrent, mild: Secondary | ICD-10-CM | POA: Diagnosis not present

## 2022-04-13 DIAGNOSIS — N1832 Chronic kidney disease, stage 3b: Secondary | ICD-10-CM | POA: Diagnosis not present

## 2022-04-13 LAB — POCT GLYCOSYLATED HEMOGLOBIN (HGB A1C)
HbA1c POC (<> result, manual entry): 7.1 % (ref 4.0–5.6)
HbA1c, POC (controlled diabetic range): 7.1 % — AB (ref 0.0–7.0)
HbA1c, POC (prediabetic range): 7.1 % — AB (ref 5.7–6.4)
Hemoglobin A1C: 7.1 % — AB (ref 4.0–5.6)

## 2022-04-13 MED ORDER — PANTOPRAZOLE SODIUM 20 MG PO TBEC: 20.0000 mg | DELAYED_RELEASE_TABLET | Freq: Every day | ORAL | 1 refills | Status: DC

## 2022-04-13 MED ORDER — CARVEDILOL 12.5 MG PO TABS: 12.5000 mg | ORAL_TABLET | Freq: Two times a day (BID) | ORAL | 1 refills | Status: DC

## 2022-04-13 MED ORDER — VENLAFAXINE HCL ER 150 MG PO CP24: 150.0000 mg | ORAL_CAPSULE | Freq: Every day | ORAL | 1 refills | Status: DC

## 2022-04-13 MED ORDER — MIRTAZAPINE 15 MG PO TABS
ORAL_TABLET | ORAL | 1 refills | Status: DC
Start: 2022-04-13 — End: 2022-08-13

## 2022-04-13 MED ORDER — HYDROCHLOROTHIAZIDE 50 MG PO TABS: 50.0000 mg | ORAL_TABLET | Freq: Every day | ORAL | 1 refills | Status: DC

## 2022-04-13 MED ORDER — GLIMEPIRIDE 4 MG PO TABS: ORAL_TABLET | ORAL | 1 refills | Status: DC

## 2022-04-13 MED ORDER — FENOFIBRATE 160 MG PO TABS
160.0000 mg | ORAL_TABLET | Freq: Every day | ORAL | 3 refills | Status: DC
Start: 2022-04-13 — End: 2022-11-26

## 2022-04-13 MED ORDER — METFORMIN HCL 1000 MG PO TABS: 1000.0000 mg | ORAL_TABLET | Freq: Every day | ORAL | 1 refills | Status: DC

## 2022-04-13 NOTE — Patient Instructions (Addendum)
Return in about 4 months (around 08/09/2022) for Routine chronic condition follow-up.        Great to see you today.  I have refilled the medication(s) we provide.   If labs were collected, we will inform you of lab results once received either by echart message or telephone call.   - echart message- for normal results that have been seen by the patient already.   - telephone call: abnormal results or if patient has not viewed results in their echart.

## 2022-04-13 NOTE — Progress Notes (Signed)
Daisy Bennett , August 23, 1955, 66 y.o., female MRN: [id number] Patient Care Team    Relationship Specialty Notifications Start End  Daisy Hillock, DO PCP - General Family Medicine  03/10/15   Daisy Hector, MD PCP - Cardiology Cardiology Admissions 05/31/18   Daisy Bennett, Daisy Bravo, MD Referring Physician Dermatology  02/10/16   Daisy Ducking, MD (Inactive) Consulting Physician Neurology  05/18/16   Daisy Bennett, Daisy Bennett Physician Obstetrics and Gynecology  06/04/16   Daisy Bennett, OD  Optometry  08/17/16     Chief Complaint  Patient presents with   Cyst    Pt c/o cyst/sore in the L inner ear x 1 week   Diabetes    [facility]; pt is not fasting     Subjective: [name] is a 66 y.o. female present for [facility] follow up. Diabetes/morbid obesity: She reports compliance with Amaryl  4 mg bid daily and metformin 1000 mg qd and she is tolerating both.  Patient denies dizziness, hyperglycemic or hypoglycemic events. Patient denies numbness, tingling in the extremities or nonhealing wounds of feet.   Hypertension/hyperlipidemia: She reports compliance with her medication regimen of Crestor 10 mg daily, Effient 10 mg daily, losartan 100 mg daily, HCTZ 50 mg daily, fish oil 1000 mg daily, fenofibrate 160 mg daily Norvasc 10 mg, baby aspirin daily, Coreg 6.25 mg twice daily . H/O CAD, NSTEMI, post stent 2011 ([facility])-collateralized RCA.  She follows with Daisy Bennett. Patient denies chest pain, shortness of breath, dizziness or lower extremity edema.  Exercise: routine   depression/MS:  Patient reports compliance with Effexor at 150 mg daily and remeron 15 mg qhs.    Ear growth: Patient reports she had a "growth "in her ear.  She did notice this morning it was less tender.  She had some blood in her ear 2 days ago.      04/13/2022    1:29 PM 12/02/2021   11:01 AM 09/28/2021   10:15 AM 05/27/2021    4:08 PM 11/26/2020    3:48 PM  Depression screen PHQ 2/9  Decreased Interest 0 0 0  0 0  Down, Depressed, Hopeless 0 0 0 0 0  PHQ - 2 Score 0 0 0 0 0  Altered sleeping 3  1    Tired, decreased energy 0  0    Change in appetite 0  0    Feeling bad or failure about yourself  0  0    Trouble concentrating 0  0    Moving slowly or fidgety/restless 0  0    Suicidal thoughts 0  0    PHQ-9 Score 3  1      Allergies  Allergen Reactions   Aubagio [Teriflunomide] Diarrhea    Pancreatitis, neuropathy, elevated glucose   Betaseron [Interferon Beta-1b] Other (See Comments)    Increased LFT's   Provigil [Modafinil] Swelling    Tongue swelling and sores   Topamax [Topiramate]     Cognitive slowing   Doxycycline Other (See Comments)    Thrush,dizziness   Social History   Social History Narrative   The patient lives with husband. 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    Patient drinks occasionally, does not chew tobacco, does not use recreational drugs. She does drink occasional caffeine. She does wear her seatbelt. She does wear bike helmet riding a bike. She does exercise 3 times a week. She does not wear hearing aids or dentures.  There is a smoke alarm in her home. There are no firearms in her home. She feels safe in her relationship. She has never experienced physical abuse.   Patient sleeps 7-8 hours a night.   Patient drinks 2 cups of caffeine daily.   Patient is right handed.   Past Medical History:  Diagnosis Date   Basal cell carcinoma    Brain bleed ([place]) 2017   COVID-19 08/2019   DM (diabetes mellitus) ([place])    Former smoker    Glossitis, benign migratory 08/31/2019   Goiter, unspecified    Heart murmur    History of colonic polyps 2012   Multiple sclerosis (HCC)    Non-ST elevated myocardial infarction (non-STEMI) (HCC)    Olecranon bursitis of left elbow 10/17/2017   Oral mucositis 08/31/2019   SCCA (squamous cell carcinoma) of skin    right shin   SEMI (subendocardial myocardial infarction) ([place]) 09/29/2009   TOTAL RCA WITH  STENT TO OM BRANCH   Shingles    Squamous cell carcinoma 2019   lower ext   Traumatic rupture of volar plate of left ring finger 03/25/2020   Unspecified essential hypertension    Unspecified hypothyroidism    Past Surgical History:  Procedure Laterality Date   CATARACT SURGERY  07/2009   CESAREAN SECTION     CORONARY ANGIOPLASTY WITH STENT PLACEMENT  2011   HAND SURGERY Left 09/2021   tendon surgery   KNEE SURGERY     leg surgery     squamous ccell removed    TENOSYNOVECTOMY Left 10/26/2021   Procedure: LEFT WRIST EXTENSOR TENOSYNOVECTOMY;  Surgeon: Daisy Cover, MD;  Location: [facility];  Service: Orthopedics;  Laterality: Left;  62 MIN   TONSILLECTOMY     VESICOVAGINAL FISTULA CLOSURE W/ TAH     Family History  Problem Relation Age of Onset   Hypertension Mother    Heart failure Father    Diabetes Father    Aneurysm Brother        THORACIC/ABD ANEURYSM   Prostate cancer Brother 9   Diabetes Brother    Fibromyalgia Sister    Brain cancer Maternal Grandmother 46   Multiple sclerosis Neg Hx    Colon cancer Neg Hx    Esophageal cancer Neg Hx    Allergies as of 04/13/2022       Reactions   Aubagio [teriflunomide] Diarrhea   Pancreatitis, neuropathy, elevated glucose   Betaseron [interferon Beta-1b] Other (See Comments)   Increased LFT's   Provigil [modafinil] Swelling   Tongue swelling and sores   Topamax [topiramate]    Cognitive slowing   Doxycycline Other (See Comments)   Thrush,dizziness        Medication List        Accurate as of April 13, 2022  3:15 PM. If you have any questions, ask your nurse or doctor.          amLODipine 10 MG tablet Commonly known as: NORVASC Take 1 tablet (10 mg total) by mouth daily. Pt needs to keep upcoming appt in Oct for further refills   B COMPLEX-B12 PO Take by mouth.   carvedilol 12.5 MG tablet Commonly known as: COREG Take 1 tablet (12.5 mg total) by mouth 2 (two) times daily.    cholecalciferol 1000 units tablet Commonly known as: VITAMIN D Take 1,000 Units by mouth daily.   fenofibrate 160 MG tablet Take 1 tablet (160 mg total) by mouth daily.   fish oil-omega-3 fatty acids 1000 MG  capsule Take 1 g by mouth daily.   glimepiride 4 MG tablet Commonly known as: AMARYL TAKE 1 TABLET BY MOUTH TWICE A DAY   hydrochlorothiazide 50 MG tablet Commonly known as: HYDRODIURIL Take 1 tablet (50 mg total) by mouth daily.   Iron 325 (65 Fe) MG Tabs Take 325 mg by mouth daily.   levothyroxine 112 MCG tablet Commonly known as: SYNTHROID Take 1 tablet (112 mcg total) by mouth daily.   losartan 100 MG tablet Commonly known as: COZAAR TAKE 1 TABLET BY MOUTH DAILY. MUST KEEP UPCOMING APPOINTMENT FOR FUTURE REFILLS. THANK YOU.   metFORMIN 1000 MG tablet Commonly known as: GLUCOPHAGE Take 1 tablet (1,000 mg total) by mouth daily with breakfast.   mirtazapine 15 MG tablet Commonly known as: REMERON TAKE 1 TABLET BY MOUTH EVERYDAY AT BEDTIME   nitroGLYCERIN 0.4 MG SL tablet Commonly known as: Nitrostat Place 1 tablet (0.4 mg total) under the tongue every 5 (five) minutes x 3 doses as needed for chest pain.   pantoprazole 20 MG tablet Commonly known as: PROTONIX Take 1 tablet (20 mg total) by mouth daily.   potassium chloride 10 MEQ tablet Commonly known as: KLOR-CON Take 1 tablet (10 mEq total) by mouth daily.   prasugrel 10 MG Tabs tablet Commonly known as: EFFIENT Take 1 tablet (10 mg total) by mouth daily.   rosuvastatin 20 MG tablet Commonly known as: CRESTOR Take 1 tablet (20 mg total) by mouth daily.   venlafaxine XR 150 MG 24 hr capsule Commonly known as: Effexor XR Take 1 capsule (150 mg total) by mouth daily with breakfast.        All past medical history, surgical history, allergies, family history, immunizations andmedications were updated in the EMR today and reviewed under the history and medication portions of their EMR.     ROS:  Negative, with the exception of above mentioned in HPI   Objective:  BP 127/61   Pulse (!) 55   Temp 98 F (36.7 C) (Oral)   Ht _0  (1.676 m)   Wt 173 lb (78.5 kg)   SpO2 100%   BMI 27.92 kg/m  Body mass index is 27.92 kg/m. Physical Exam Vitals and nursing note reviewed.  Constitutional:      General: She is not in acute distress.    Appearance: Normal appearance. She is not ill-appearing, toxic-appearing or diaphoretic.  HENT:     Head: Normocephalic and atraumatic.     Mouth/Throat:     Mouth: Mucous membranes are moist.  Eyes:     General: No scleral icterus.       Right eye: No discharge.        Left eye: No discharge.     Extraocular Movements: Extraocular movements intact.     Conjunctiva/sclera: Conjunctivae normal.     Pupils: Pupils are equal, round, and reactive to light.  Cardiovascular:     Rate and Rhythm: Normal rate and regular rhythm.  Pulmonary:     Effort: Pulmonary effort is normal. No respiratory distress.     Breath sounds: Normal breath sounds. No wheezing, rhonchi or rales.  Musculoskeletal:     Cervical back: Neck supple. No tenderness.     Right lower leg: No edema.     Left lower leg: No edema.  Lymphadenopathy:     Cervical: No cervical adenopathy.  Skin:    General: Skin is warm and dry.     Coloration: Skin is not jaundiced or pale.  Findings: No erythema or rash.  Neurological:     Mental Status: She is alert and oriented to person, place, and time. Mental status is at baseline.     Motor: No weakness.     Gait: Gait normal.  Psychiatric:        Mood and Affect: Mood normal.        Behavior: Behavior normal.        Thought Content: Thought content normal.        Judgment: Judgment normal.    Diabetic Foot Exam - Simple   Simple Foot Form Diabetic Foot exam was performed with the following findings: Yes 04/13/2022  1:42 PM  Visual Inspection No deformities, no ulcerations, no other skin breakdown bilaterally:  Yes Sensation Testing Intact to touch and monofilament testing bilaterally: Yes Pulse Check Posterior Tibialis and Dorsalis pulse intact bilaterally: Yes Comments     No results found. No results found. Results for orders placed or performed in visit on 04/13/22 (from the past 24 hour(s))  POCT HgB A1C     Status: Abnormal   Collection Time: 04/13/22  1:30 PM  Result Value Ref Range   Hemoglobin A1C 7.1 (A) 4.0 - 5.6 %   HbA1c POC (<> result, manual entry) 7.1 4.0 - 5.6 %   HbA1c, POC (prediabetic range) 7.1 (A) 5.7 - 6.4 %   HbA1c, POC (controlled diabetic range) 7.1 (A) 0.0 - 7.0 %     Assessment/Plan: Daisy Bennett is a 66 y.o. female present for OV for [facility] Diabetes mellitus with complication (HCC)/Morbid obesity -Stable -Continue Amaryl to 4 mg BID -Continue metformin 1000 mg daily (decreased secondary to kidney function) - increase exercise regimen and continue dietary modification> cut back on sugary drinks. - tapered off gabapentin (w/ neuro assistance)  POCT HgB A1C--> 5.9> 6.1> 6.6> 8.6> 7.6 > 7.1 collected today - prevnar 20 completed  -  foot exam completed 04/13/2022 - Eye exam: UTD 02/11/2021 UTD.  Dr. Venetia Constable, yearly eye exams - Microalbumin: Today - flu shot administered today  Hypertension/hyperlipidemia/Antiplatelet or antithrombotic long-term use/athersclerosis/hypokalemia/s/p stent placement:  Stable - follows with cardiology, Daisy Bennett who prescribes medications.  - low salt, increase exercise.  Continue amlodipine 10, losartan 100, Continue coreg  12.5 mg BID  Continue hctz 50 mg Qd Continue Kdur 10 meq/d Continue fenofibrate Continue statin Continue Effient and baby aspirin.  Cardiology feels she needs to continue both. - maintain routine cardiology follow ups.  CBC and CMP collected today.   Depression Stable Continue Effexor 150 mg daily -She tapered off gabapentin with neuro assistance Continue remeron 15 mg nightly  Multiple  sclerosis ([place]) Managed by neuro. -currently off all meds  CKD 3 Avoid NSAIDs when able Renally dose meds when appropriate Vitamin D levels up-to-date CMP collected today  hypothyroid TSH up-to-date Continue levo 112 mcg.   Ear discomfort: Small excoriation present in the distal external auditory meatus.  No bleeding present no signs of infection present Reassured  Reviewed expectations re: course of current medical issues. Discussed self-management of symptoms. Outlined signs and symptoms indicating need for more acute intervention. Patient verbalized understanding and all questions were answered. Patient received an After-Visit Summary.    Orders Placed This Encounter  Procedures   Flu Vaccine QUAD High Dose(Fluad)   Comp Met (CMET)   Vitamin D (25 hydroxy)   PTH, intact (no Ca)   CBC   POCT HgB A1C    Meds ordered this encounter  Medications   carvedilol (  COREG) 12.5 MG tablet    Sig: Take 1 tablet (12.5 mg total) by mouth 2 (two) times daily.    Dispense:  180 tablet    Refill:  1   fenofibrate 160 MG tablet    Sig: Take 1 tablet (160 mg total) by mouth daily.    Dispense:  90 tablet    Refill:  3   glimepiride (AMARYL) 4 MG tablet    Sig: TAKE 1 TABLET BY MOUTH TWICE A DAY    Dispense:  180 tablet    Refill:  1   hydrochlorothiazide (HYDRODIURIL) 50 MG tablet    Sig: Take 1 tablet (50 mg total) by mouth daily.    Dispense:  90 tablet    Refill:  1   metFORMIN (GLUCOPHAGE) 1000 MG tablet    Sig: Take 1 tablet (1,000 mg total) by mouth daily with breakfast.    Dispense:  90 tablet    Refill:  1   mirtazapine (REMERON) 15 MG tablet    Sig: TAKE 1 TABLET BY MOUTH EVERYDAY AT BEDTIME    Dispense:  90 tablet    Refill:  1   pantoprazole (PROTONIX) 20 MG tablet    Sig: Take 1 tablet (20 mg total) by mouth daily.    Dispense:  90 tablet    Refill:  1   venlafaxine XR (EFFEXOR XR) 150 MG 24 hr capsule    Sig: Take 1 capsule (150 mg total) by mouth daily  with breakfast.    Dispense:  90 capsule    Refill:  1     Referral Orders  No referral(s) requested today   > 45 minutes was dedicated to this patient's encounter to include managing multiple chronic serious conditions with laboratory evaluation, medications and new acute concern.     Note is dictated utilizing voice recognition software. Although note has been proof read prior to signing, occasional typographical errors still can be missed. If any questions arise, please do not hesitate to call for verification.   electronically signed by:  Howard Pouch, DO  [place]

## 2022-04-14 ENCOUNTER — Telehealth: Payer: Self-pay | Admitting: Family Medicine

## 2022-04-14 DIAGNOSIS — M25521 Pain in right elbow: Secondary | ICD-10-CM | POA: Diagnosis not present

## 2022-04-14 LAB — COMPREHENSIVE METABOLIC PANEL
AG Ratio: 1.5 (calc) (ref 1.0–2.5)
ALT: 19 U/L (ref 6–29)
AST: 22 U/L (ref 10–35)
Albumin: 5 g/dL (ref 3.6–5.1)
Alkaline phosphatase (APISO): 83 U/L (ref 37–153)
BUN/Creatinine Ratio: 12 (calc) (ref 6–22)
BUN: 22 mg/dL (ref 7–25)
CO2: 25 mmol/L (ref 20–32)
Calcium: 10.3 mg/dL (ref 8.6–10.4)
Chloride: 99 mmol/L (ref 98–110)
Creat: 1.85 mg/dL — ABNORMAL HIGH (ref 0.50–1.05)
Globulin: 3.3 g/dL (calc) (ref 1.9–3.7)
Glucose, Bld: 138 mg/dL — ABNORMAL HIGH (ref 65–99)
Potassium: 3.7 mmol/L (ref 3.5–5.3)
Sodium: 138 mmol/L (ref 135–146)
Total Bilirubin: 0.5 mg/dL (ref 0.2–1.2)
Total Protein: 8.3 g/dL — ABNORMAL HIGH (ref 6.1–8.1)

## 2022-04-14 LAB — CBC
HCT: 36.8 % (ref 35.0–45.0)
Hemoglobin: 12.6 g/dL (ref 11.7–15.5)
MCH: 32.9 pg (ref 27.0–33.0)
MCHC: 34.2 g/dL (ref 32.0–36.0)
MCV: 96.1 fL (ref 80.0–100.0)
MPV: 14.1 fL — ABNORMAL HIGH (ref 7.5–12.5)
Platelets: 180 10*3/uL (ref 140–400)
RBC: 3.83 10*6/uL (ref 3.80–5.10)
RDW: 12.1 % (ref 11.0–15.0)
WBC: 14.7 10*3/uL — ABNORMAL HIGH (ref 3.8–10.8)

## 2022-04-14 LAB — VITAMIN D 25 HYDROXY (VIT D DEFICIENCY, FRACTURES): Vit D, 25-Hydroxy: 26 ng/mL — ABNORMAL LOW (ref 30–100)

## 2022-04-14 LAB — PARATHYROID HORMONE, INTACT (NO CA): PTH: 25 pg/mL (ref 16–77)

## 2022-04-14 NOTE — Telephone Encounter (Signed)
Spoke with patient regarding results/recommendations.  

## 2022-04-14 NOTE — Telephone Encounter (Signed)
Please inform patient: Her kidney function is decreased significantly from when she was last seen. Her white count is elevated to 14.7 Her protein levels are elevated Vitamin D levels are low at 26  I would encourage her to increase her vitamin D daily dose to 2000 units daily.   I will need to see her back so we can further investigate kidney function, elevated white count and elevated protein levels. I encouraged her to make sure to increase hydration over the next week and follow-up within 1-2 weeks with provider.

## 2022-04-16 DIAGNOSIS — M25521 Pain in right elbow: Secondary | ICD-10-CM | POA: Diagnosis not present

## 2022-04-16 DIAGNOSIS — M7711 Lateral epicondylitis, right elbow: Secondary | ICD-10-CM | POA: Diagnosis not present

## 2022-04-21 DIAGNOSIS — M25521 Pain in right elbow: Secondary | ICD-10-CM | POA: Diagnosis not present

## 2022-04-26 DIAGNOSIS — M25521 Pain in right elbow: Secondary | ICD-10-CM | POA: Diagnosis not present

## 2022-04-28 ENCOUNTER — Ambulatory Visit (INDEPENDENT_AMBULATORY_CARE_PROVIDER_SITE_OTHER): Payer: Medicare Other | Admitting: Family Medicine

## 2022-04-28 ENCOUNTER — Encounter: Payer: Self-pay | Admitting: Family Medicine

## 2022-04-28 VITALS — BP 136/72 | HR 60 | Temp 97.8°F | Ht 66.0 in | Wt 177.0 lb

## 2022-04-28 DIAGNOSIS — N289 Disorder of kidney and ureter, unspecified: Secondary | ICD-10-CM | POA: Diagnosis not present

## 2022-04-28 DIAGNOSIS — R779 Abnormality of plasma protein, unspecified: Secondary | ICD-10-CM

## 2022-04-28 DIAGNOSIS — R7989 Other specified abnormal findings of blood chemistry: Secondary | ICD-10-CM

## 2022-04-28 NOTE — Patient Instructions (Signed)
We will call you with lab results and make further plan form there.   Continue to hydrate.

## 2022-04-28 NOTE — Progress Notes (Signed)
Daisy Bennett , [birth year], 66 y.o., female MRN: [id number] Patient Care Team    Relationship Specialty Notifications Start End  Ma Hillock, DO PCP - General Family Medicine  03/10/15   Josue Hector, MD PCP - Cardiology Cardiology Admissions 05/31/18   [place], Jennefer Bravo, MD Referring Physician Dermatology  02/10/16   Kathrynn Ducking, MD (Inactive) Consulting Physician Neurology  05/18/16   Linda Hedges, [place] Physician Obstetrics and Gynecology  06/04/16   Anell Barr, OD  Optometry  08/17/16     Chief Complaint  Patient presents with   decreased kidney function    F/u; pt is fasting     Subjective: Daisy Bennett is a 66 y.o. female present today to follow-up on abnormal labs incidentally noted on chronic condition appointment.  Her creatinine had greatly increased from prior collections.  She had a mildly elevated white blood cell count and an elevated protein.  Denies any acute illness to lab collection.  She denies fevers or chills.     04/13/2022    1:29 PM 12/02/2021   11:01 AM 09/28/2021   10:15 AM 05/27/2021    4:08 PM 11/26/2020    3:48 PM  Depression screen PHQ 2/9  Decreased Interest 0 0 0 0 0  Down, Depressed, Hopeless 0 0 0 0 0  PHQ - 2 Score 0 0 0 0 0  Altered sleeping 3  1    Tired, decreased energy 0  0    Change in appetite 0  0    Feeling bad or failure about yourself  0  0    Trouble concentrating 0  0    Moving slowly or fidgety/restless 0  0    Suicidal thoughts 0  0    PHQ-9 Score 3  1      Allergies  Allergen Reactions   Aubagio [Teriflunomide] Diarrhea    Pancreatitis, neuropathy, elevated glucose   Betaseron [Interferon Beta-1b] Other (See Comments)    Increased LFT's   Provigil [Modafinil] Swelling    Tongue swelling and sores   Topamax [Topiramate]     Cognitive slowing   Doxycycline Other (See Comments)    Thrush,dizziness   Social History   Social History Narrative   The patient lives with husband.  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    Patient drinks occasionally, does not chew tobacco, does not use recreational drugs. She does drink occasional caffeine. She does wear her seatbelt. She does wear bike helmet riding a bike. She does exercise 3 times a week. She does not wear hearing aids or dentures. There is a smoke alarm in her home. There are no firearms in her home. She feels safe in her relationship. She has never experienced physical abuse.   Patient sleeps 7-8 hours a night.   Patient drinks 2 cups of caffeine daily.   Patient is right handed.   Past Medical History:  Diagnosis Date   Basal cell carcinoma    Brain bleed ([facility]) 2017   COVID-19 08/2019   DM (diabetes mellitus) ([facility])    Former smoker    Glossitis, benign migratory 08/31/2019   Goiter, unspecified    Heart murmur    History of colonic polyps 2012   Multiple sclerosis (HCC)    Non-ST elevated myocardial infarction (non-STEMI) (HCC)    Olecranon bursitis of left elbow 10/17/2017   Oral mucositis 08/31/2019   SCCA (squamous cell carcinoma)  of skin    right shin   SEMI (subendocardial myocardial infarction) ([facility]) 09/29/2009   TOTAL RCA WITH STENT TO OM BRANCH   Shingles    Squamous cell carcinoma 2019   lower ext   Traumatic rupture of volar plate of left ring finger 03/25/2020   Unspecified essential hypertension    Unspecified hypothyroidism    Past Surgical History:  Procedure Laterality Date   CATARACT SURGERY  07/2009   CESAREAN SECTION     CORONARY ANGIOPLASTY WITH STENT PLACEMENT  2011   HAND SURGERY Left 09/2021   tendon surgery   KNEE SURGERY     leg surgery     squamous ccell removed    TENOSYNOVECTOMY Left 10/26/2021   Procedure: LEFT WRIST EXTENSOR TENOSYNOVECTOMY;  Surgeon: Leanora Cover, MD;  Location: [facility];  Service: Orthopedics;  Laterality: Left;  46 MIN   TONSILLECTOMY     VESICOVAGINAL FISTULA CLOSURE W/ TAH     Family History  Problem  Relation Age of Onset   Hypertension Mother    Heart failure Father    Diabetes Father    Aneurysm Brother        THORACIC/ABD ANEURYSM   Prostate cancer Brother 95   Diabetes Brother    Fibromyalgia Sister    Brain cancer Maternal Grandmother 85   Multiple sclerosis Neg Hx    Colon cancer Neg Hx    Esophageal cancer Neg Hx    Allergies as of 04/28/2022       Reactions   Aubagio [teriflunomide] Diarrhea   Pancreatitis, neuropathy, elevated glucose   Betaseron [interferon Beta-1b] Other (See Comments)   Increased LFT's   Provigil [modafinil] Swelling   Tongue swelling and sores   Topamax [topiramate]    Cognitive slowing   Doxycycline Other (See Comments)   Thrush,dizziness        Medication List        Accurate as of April 28, 2022  3:00 PM. If you have any questions, ask your nurse or doctor.          amLODipine 10 MG tablet Commonly known as: NORVASC Take 1 tablet (10 mg total) by mouth daily. Pt needs to keep upcoming appt in Oct for further refills   B COMPLEX-B12 PO Take by mouth.   carvedilol 12.5 MG tablet Commonly known as: COREG Take 1 tablet (12.5 mg total) by mouth 2 (two) times daily.   cholecalciferol 1000 units tablet Commonly known as: VITAMIN D Take 2,000 Units by mouth daily.   fenofibrate 160 MG tablet Take 1 tablet (160 mg total) by mouth daily.   fish oil-omega-3 fatty acids 1000 MG capsule Take 1 g by mouth daily.   glimepiride 4 MG tablet Commonly known as: AMARYL TAKE 1 TABLET BY MOUTH TWICE A DAY   hydrochlorothiazide 50 MG tablet Commonly known as: HYDRODIURIL Take 1 tablet (50 mg total) by mouth daily.   Iron 325 (65 Fe) MG Tabs Take 325 mg by mouth daily.   levothyroxine 112 MCG tablet Commonly known as: SYNTHROID Take 1 tablet (112 mcg total) by mouth daily.   losartan 100 MG tablet Commonly known as: COZAAR TAKE 1 TABLET BY MOUTH DAILY. MUST KEEP UPCOMING APPOINTMENT FOR FUTURE REFILLS. THANK YOU.    metFORMIN 1000 MG tablet Commonly known as: GLUCOPHAGE Take 1 tablet (1,000 mg total) by mouth daily with breakfast.   mirtazapine 15 MG tablet Commonly known as: REMERON TAKE 1 TABLET BY MOUTH EVERYDAY AT BEDTIME  nitroGLYCERIN 0.4 MG SL tablet Commonly known as: Nitrostat Place 1 tablet (0.4 mg total) under the tongue every 5 (five) minutes x 3 doses as needed for chest pain.   pantoprazole 20 MG tablet Commonly known as: PROTONIX Take 1 tablet (20 mg total) by mouth daily.   potassium chloride 10 MEQ tablet Commonly known as: KLOR-CON Take 1 tablet (10 mEq total) by mouth daily.   prasugrel 10 MG Tabs tablet Commonly known as: EFFIENT Take 1 tablet (10 mg total) by mouth daily.   rosuvastatin 20 MG tablet Commonly known as: CRESTOR Take 1 tablet (20 mg total) by mouth daily.   venlafaxine XR 150 MG 24 hr capsule Commonly known as: Effexor XR Take 1 capsule (150 mg total) by mouth daily with breakfast.        All past medical history, surgical history, allergies, family history, immunizations andmedications were updated in the EMR today and reviewed under the history and medication portions of their EMR.     ROS Negative, with the exception of above mentioned in HPI   Objective:  BP 136/72   Pulse 60   Temp 97.8 F (36.6 C) (Oral)   Ht '5\' 6"'$  (1.676 m)   Wt 177 lb (80.3 kg)   SpO2 100%   BMI 28.57 kg/m  Body mass index is 28.57 kg/m. Physical Exam Vitals and nursing note reviewed.  Constitutional:      General: She is not in acute distress.    Appearance: Normal appearance. She is not ill-appearing, toxic-appearing or diaphoretic.  HENT:     Head: Normocephalic and atraumatic.  Eyes:     General: No scleral icterus.       Right eye: No discharge.        Left eye: No discharge.     Extraocular Movements: Extraocular movements intact.     Conjunctiva/sclera: Conjunctivae normal.     Pupils: Pupils are equal, round, and reactive to light.   Cardiovascular:     Rate and Rhythm: Normal rate and regular rhythm.     Heart sounds: Murmur heard.  Pulmonary:     Effort: Pulmonary effort is normal. No respiratory distress.     Breath sounds: Normal breath sounds. No wheezing, rhonchi or rales.  Musculoskeletal:     Cervical back: Neck supple. No tenderness.     Right lower leg: No edema.     Left lower leg: No edema.  Lymphadenopathy:     Cervical: No cervical adenopathy.  Skin:    General: Skin is warm and dry.     Coloration: Skin is not jaundiced or pale.     Findings: No erythema or rash.  Neurological:     Mental Status: She is alert and oriented to person, place, and time. Mental status is at baseline.     Motor: No weakness.     Gait: Gait normal.  Psychiatric:        Mood and Affect: Mood normal.        Behavior: Behavior normal.        Thought Content: Thought content normal.        Judgment: Judgment normal.     No results found. No results found. No results found for this or any previous visit (from the past 24 hour(s)).  Assessment/Plan: Daisy Bennett is a 66 y.o. female present for OV for  Elevated blood protein/abnormal CBC/kidney function decreased We discussed her lab results today including differential MGUS, MM, dehydration etc.  She has been  drinking at least 64 ounces of water daily since receiving results.  She has had unintentional weight loss over the last year secondary to diarrhea that was presumed to be secondary to the medication for her multiple sclerosis.   - Comprehensive metabolic panel - Protein,Total and Electrophor w/IFE-([facility]) - Protein, Total and Protein Electrophoresis, Random Urine (REFL) - Kappa/lambda light chains - CBC w/Diff - Pathologist smear review - Urinalysis, Routine w reflex microscopic  Reviewed expectations re: course of current medical issues. Discussed self-management of symptoms. Outlined signs and symptoms indicating need for more acute  intervention. Patient verbalized understanding and all questions were answered. Patient received an After-Visit Summary.    Orders Placed This Encounter  Procedures   Comprehensive metabolic panel   Protein,Total and Electrophor w/IFE-([facility])   Protein, Total and Protein Electrophoresis, Random Urine (REFL)   Kappa/lambda light chains   CBC w/Diff   Pathologist smear review   Urinalysis, Routine w reflex microscopic   No orders of the defined types were placed in this encounter.  Referral Orders  No referral(s) requested today     Note is dictated utilizing voice recognition software. Although note has been proof read prior to signing, occasional typographical errors still can be missed. If any questions arise, please do not hesitate to call for verification.   electronically signed by:  Howard Pouch, DO  [place]

## 2022-04-29 ENCOUNTER — Other Ambulatory Visit: Payer: Self-pay | Admitting: Cardiovascular Disease

## 2022-04-29 ENCOUNTER — Telehealth: Payer: Self-pay | Admitting: Family Medicine

## 2022-04-29 ENCOUNTER — Other Ambulatory Visit: Payer: Self-pay | Admitting: Family Medicine

## 2022-04-29 DIAGNOSIS — E118 Type 2 diabetes mellitus with unspecified complications: Secondary | ICD-10-CM

## 2022-04-29 NOTE — Telephone Encounter (Signed)
Spoke with pt regarding labs and instructions.   

## 2022-04-29 NOTE — Telephone Encounter (Signed)
Please inform patient: Her kidney function improved greatly with hydration, but still not back to her baseline. The protein levels and white blood cell count have  also improved. Urine had a very small amount of white blood cells, otherwise completely normal look great.  Culture is pending.  So far, everything is looking positive.  We will call her with the remaining of the results once we get them which can take quite a few days.  Please encourage her to continue hydration.

## 2022-04-30 LAB — URINALYSIS, ROUTINE W REFLEX MICROSCOPIC
Bacteria, UA: NONE SEEN /HPF
Bilirubin Urine: NEGATIVE
Glucose, UA: NEGATIVE
Hgb urine dipstick: NEGATIVE
Hyaline Cast: NONE SEEN /LPF
Ketones, ur: NEGATIVE
Nitrite: NEGATIVE
Protein, ur: NEGATIVE
RBC / HPF: NONE SEEN /HPF (ref 0–2)
Specific Gravity, Urine: 1.005 (ref 1.001–1.035)
Squamous Epithelial / HPF: NONE SEEN /HPF (ref <=5)
WBC, UA: NONE SEEN /HPF (ref 0–5)
pH: 7.5 (ref 5.0–8.0)

## 2022-04-30 LAB — PROTEIN,TOTAL AND PROTEIN ELECTROPHORESIS, RANDOM URINE(REFL)
Albumin: 72 %
Alpha-1-Globulin, U: 6 %
Alpha-2-Globulin, U: 7 %
Beta Globulin, U: 10 %
Creatinine, Urine: 50 mg/dL (ref 20–275)
Gamma Globulin, U: 5 %
Protein/Creat Ratio: 180 mg/g creat (ref 24–184)
Protein/Creatinine Ratio: 0.18 mg/mg creat (ref 0.024–0.184)
Total Protein, Urine: 9 mg/dL (ref 5–24)

## 2022-04-30 LAB — URINE CULTURE
MICRO NUMBER:: 13976505
Result:: NO GROWTH
SPECIMEN QUALITY:: ADEQUATE

## 2022-04-30 LAB — MICROSCOPIC MESSAGE

## 2022-05-03 ENCOUNTER — Encounter: Payer: Self-pay | Admitting: Family Medicine

## 2022-05-04 ENCOUNTER — Ambulatory Visit: Payer: Medicare Other | Admitting: Family Medicine

## 2022-05-04 DIAGNOSIS — M7711 Lateral epicondylitis, right elbow: Secondary | ICD-10-CM | POA: Diagnosis not present

## 2022-05-04 LAB — COMPREHENSIVE METABOLIC PANEL
AG Ratio: 1.8 (calc) (ref 1.0–2.5)
ALT: 15 U/L (ref 6–29)
AST: 19 U/L (ref 10–35)
Albumin: 4.7 g/dL (ref 3.6–5.1)
Alkaline phosphatase (APISO): 83 U/L (ref 37–153)
BUN/Creatinine Ratio: 17 (calc) (ref 6–22)
BUN: 21 mg/dL (ref 7–25)
CO2: 26 mmol/L (ref 20–32)
Calcium: 10 mg/dL (ref 8.6–10.4)
Chloride: 97 mmol/L — ABNORMAL LOW (ref 98–110)
Creat: 1.27 mg/dL — ABNORMAL HIGH (ref 0.50–1.05)
Globulin: 2.6 g/dL (calc) (ref 1.9–3.7)
Glucose, Bld: 178 mg/dL — ABNORMAL HIGH (ref 65–99)
Potassium: 3.9 mmol/L (ref 3.5–5.3)
Sodium: 137 mmol/L (ref 135–146)
Total Bilirubin: 0.4 mg/dL (ref 0.2–1.2)
Total Protein: 7.3 g/dL (ref 6.1–8.1)

## 2022-05-04 LAB — CBC WITH DIFFERENTIAL/PLATELET
Absolute Monocytes: 601 cells/uL (ref 200–950)
Basophils Absolute: 47 cells/uL (ref 0–200)
Basophils Relative: 0.6 %
Eosinophils Absolute: 265 cells/uL (ref 15–500)
Eosinophils Relative: 3.4 %
HCT: 35.1 % (ref 35.0–45.0)
Hemoglobin: 11.9 g/dL (ref 11.7–15.5)
Lymphs Abs: 803 cells/uL — ABNORMAL LOW (ref 850–3900)
MCH: 32.8 pg (ref 27.0–33.0)
MCHC: 33.9 g/dL (ref 32.0–36.0)
MCV: 96.7 fL (ref 80.0–100.0)
MPV: 14 fL — ABNORMAL HIGH (ref 7.5–12.5)
Monocytes Relative: 7.7 %
Neutro Abs: 6084 cells/uL (ref 1500–7800)
Neutrophils Relative %: 78 %
Platelets: 176 10*3/uL (ref 140–400)
RBC: 3.63 10*6/uL — ABNORMAL LOW (ref 3.80–5.10)
RDW: 12.1 % (ref 11.0–15.0)
Total Lymphocyte: 10.3 %
WBC: 7.8 10*3/uL (ref 3.8–10.8)

## 2022-05-04 LAB — PROTEIN,TOTAL AND ELECTROPHOR W/IFE
Albumin ELP: 4.3 g/dL (ref 3.8–4.8)
Alpha 1: 0.3 g/dL (ref 0.2–0.3)
Alpha 2: 0.9 g/dL (ref 0.5–0.9)
Beta 2: 0.4 g/dL (ref 0.2–0.5)
Beta Globulin: 0.4 g/dL (ref 0.4–0.6)
Gamma Globulin: 1 g/dL (ref 0.8–1.7)
Total Protein: 7.2 g/dL (ref 6.1–8.1)

## 2022-05-04 LAB — PATHOLOGIST SMEAR REVIEW

## 2022-05-04 LAB — KAPPA/LAMBDA LIGHT CHAINS
Kappa free light chain: 59.5 mg/L — ABNORMAL HIGH (ref 3.3–19.4)
Kappa:Lambda Ratio: 2.1 — ABNORMAL HIGH (ref 0.26–1.65)
Lambda Free Lght Chn: 28.3 mg/L — ABNORMAL HIGH (ref 5.7–26.3)

## 2022-05-04 NOTE — Progress Notes (Signed)
Patient ID: Daisy Bennett, female   DOB: Dec 04, 1955, 66 y.o.   MRN: [id number]    66 y.o. seen f/u CAD  post stenting 2011  She had a non ST elevation MI with subsequent stenting of an OM. She has diffuse 3VD with a total right that is collateralized. She had smoewhat atypical presentation with her DM. Marland KitchenLast myovue done 06/16/18 low risk non ischemic EF 68%  Lots of dermatology issues had multiple lesions removed from right leg and thigh  October 2017:  Fell and hit head with subdural Memory issues since then. 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   Unfortunately with stress is smoking a bit again  More issues with anxiety on Effexor and Remeron started by Dr Otila Kluver 10/28/20 He long term amytriptylline was making her mouth dry   Had left wrist surgery with dr Fredna Dow 10/26/21   Has had elevated Kappa Light chains with no M spike mild anemia and elevated Cr as high as 1.85 being w/u by primary   Has 4 grand children and two daughters living in [address] well off MS meds for now seeing new neurologist at [facility]: Denies fever, malais, weight loss, blurry vision, decreased visual acuity, cough, sputum, SOB, hemoptysis, pleuritic pain, palpitaitons, heartburn, abdominal pain, melena, lower extremity edema, claudication, or rash.  All other systems reviewed and negative  General: There were no vitals taken for this visit. Affect appropriate Healthy:  appears stated age 65: normal  Neck supple with no adenopathy JVP normal no bruits no thyromegaly Lungs clear with no wheezing and good diaphragmatic motion Heart:  S1/S2 no murmur, no rub, gallop or click PMI normal Abdomen: benighn, BS positve, no tenderness, no AAA no bruit.  No HSM or HJR Distal pulses intact with no bruits No edema Neuro non-focal Skin warm and dry No muscular weakness   Current Outpatient Medications  Medication Sig  Dispense Refill   amLODipine (NORVASC) 10 MG tablet Take 1 tablet (10 mg total) by mouth daily. 30 tablet 0   B Complex Vitamins (B COMPLEX-B12 PO) Take by mouth.     carvedilol (COREG) 12.5 MG tablet Take 1 tablet (12.5 mg total) by mouth 2 (two) times daily. 180 tablet 1   cholecalciferol (VITAMIN D) 1000 units tablet Take 2,000 Units by mouth daily.     fenofibrate 160 MG tablet Take 1 tablet (160 mg total) by mouth daily. 90 tablet 3   Ferrous Sulfate (IRON) 325 (65 FE) MG TABS Take 325 mg by mouth daily.     fish oil-omega-3 fatty acids 1000 MG capsule Take 1 g by mouth daily.     glimepiride (AMARYL) 4 MG tablet TAKE 1 TABLET BY MOUTH TWICE A DAY 180 tablet 1   hydrochlorothiazide (HYDRODIURIL) 50 MG tablet Take 1 tablet (50 mg total) by mouth daily. 90 tablet 1   levothyroxine (SYNTHROID) 112 MCG tablet Take 1 tablet (112 mcg total) by mouth daily. 90 tablet 3   losartan (COZAAR) 100 MG tablet TAKE 1 TABLET BY MOUTH DAILY. MUST KEEP UPCOMING APPOINTMENT FOR FUTURE REFILLS. THANK YOU. 30 tablet 2   metFORMIN (GLUCOPHAGE) 1000 MG tablet Take 1 tablet (1,000 mg total) by mouth daily with breakfast. 90 tablet 1   mirtazapine (REMERON) 15 MG tablet TAKE 1 TABLET BY MOUTH EVERYDAY AT BEDTIME 90 tablet 1   nitroGLYCERIN (NITROSTAT) 0.4 MG SL tablet Place 1 tablet (0.4 mg  total) under the tongue every 5 (five) minutes x 3 doses as needed for chest pain. 25 tablet 4   pantoprazole (PROTONIX) 20 MG tablet Take 1 tablet (20 mg total) by mouth daily. 90 tablet 1   potassium chloride (KLOR-CON) 10 MEQ tablet Take 1 tablet (10 mEq total) by mouth daily. 30 tablet 11   prasugrel (EFFIENT) 10 MG TABS tablet Take 1 tablet (10 mg total) by mouth daily. 30 tablet 2   rosuvastatin (CRESTOR) 20 MG tablet Take 1 tablet (20 mg total) by mouth daily. 90 tablet 3   venlafaxine XR (EFFEXOR XR) 150 MG 24 hr capsule Take 1 capsule (150 mg total) by mouth daily with breakfast. 90 capsule 1   No current  facility-administered medications for this visit.    Allergies  Aubagio [teriflunomide], Betaseron [interferon beta-1b], Provigil [modafinil], Topamax [topiramate], and Doxycycline  Electrocardiogram: 05/11/2022 NSR rate 59 normal +   Assessment and Plan  CAD: Collateralized RCA and stent to OM in 2011 no angina continue medical Rx  New nitro called in Low risk myovue 06/16/17 normal perfusion no ischemia EF 68% Change effient to ASA given distant history of subdural    HTN: Well controlled.  Continue current medications and low sodium Dash type diet.    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

## 2022-05-05 ENCOUNTER — Telehealth: Payer: Self-pay | Admitting: Family Medicine

## 2022-05-05 ENCOUNTER — Ambulatory Visit: Payer: Medicare Other | Admitting: Family Medicine

## 2022-05-05 DIAGNOSIS — N058 Unspecified nephritic syndrome with other morphologic changes: Secondary | ICD-10-CM

## 2022-05-05 DIAGNOSIS — D472 Monoclonal gammopathy: Secondary | ICD-10-CM

## 2022-05-05 NOTE — Telephone Encounter (Signed)
Spoke with patient regarding results/recommendations.  

## 2022-05-05 NOTE — Telephone Encounter (Signed)
Please inform patient: Her kidney function did improve with the hydration and her creatinine is closer to normal, but still abnormal. Her total protein appears to be normal now. Her blood cell counts are now normal.  Her specialized test for her kidneys resulted abnormal and showed presence of light chains and a faint monoclonal immunoglobulin.  This can be seen in conditions such as we talked of during her appointment called MGUS or like disorders.   We will need to get her evaluated through a hematologist/oncologist for recommendations and follow-up.

## 2022-05-07 ENCOUNTER — Other Ambulatory Visit: Payer: Self-pay | Admitting: Family Medicine

## 2022-05-07 DIAGNOSIS — E118 Type 2 diabetes mellitus with unspecified complications: Secondary | ICD-10-CM

## 2022-05-11 ENCOUNTER — Ambulatory Visit: Payer: Medicare Other | Attending: Cardiovascular Disease | Admitting: Cardiovascular Disease

## 2022-05-11 ENCOUNTER — Other Ambulatory Visit: Payer: Self-pay

## 2022-05-11 ENCOUNTER — Encounter: Payer: Self-pay | Admitting: Cardiovascular Disease

## 2022-05-11 VITALS — BP 154/64 | HR 59 | Ht 66.0 in | Wt 177.0 lb

## 2022-05-11 DIAGNOSIS — I2510 Atherosclerotic heart disease of native coronary artery without angina pectoris: Secondary | ICD-10-CM | POA: Diagnosis not present

## 2022-05-11 DIAGNOSIS — E782 Mixed hyperlipidemia: Secondary | ICD-10-CM

## 2022-05-11 DIAGNOSIS — I10 Essential (primary) hypertension: Secondary | ICD-10-CM | POA: Diagnosis not present

## 2022-05-11 MED ORDER — LEVOTHYROXINE SODIUM 112 MCG PO TABS: 112.0000 ug | ORAL_TABLET | Freq: Every day | ORAL | 0 refills | Status: DC

## 2022-05-11 MED ORDER — POTASSIUM CHLORIDE ER 10 MEQ PO TBCR: 10.0000 meq | EXTENDED_RELEASE_TABLET | Freq: Every day | ORAL | 0 refills | Status: DC

## 2022-05-11 MED ORDER — ASPIRIN 81 MG PO TBEC: 81.0000 mg | DELAYED_RELEASE_TABLET | Freq: Every day | ORAL | 3 refills | Status: AC

## 2022-05-11 MED ORDER — ROSUVASTATIN CALCIUM 20 MG PO TABS: 20.0000 mg | ORAL_TABLET | Freq: Every day | ORAL | 0 refills | Status: DC

## 2022-05-11 NOTE — Patient Instructions (Addendum)
Medication Instructions:  Your physician has recommended you make the following change in your medication:  1-STOP Prasugrel (Effient)  *If you need a refill on your cardiac medications before your next appointment, please call your pharmacy*  Lab Work: If you have labs (blood work) drawn today and your tests are completely normal, you will receive your results only by: [facility] (if you have MyChart) OR A paper copy in the mail If you have any lab test that is abnormal or we need to change your treatment, we will call you to review the results.  Testing/Procedures: None ordered today.  Follow-Up: At [facility], you and your health needs are our priority.  As part of our continuing mission to provide you with exceptional heart care, we have created designated Provider Care Teams.  These Care Teams include your primary Cardiologist (physician) and Advanced Practice Providers (APPs -  Physician Assistants and Nurse Practitioners) who all work together to provide you with the care you need, when you need it.  We recommend signing up for the patient portal called "MyChart".  Sign up information is provided on this After Visit Summary.  MyChart is used to connect with patients for Virtual Visits (Telemedicine).  Patients are able to view lab/test results, encounter notes, upcoming appointments, etc.  Non-urgent messages can be sent to your provider as well.   To learn more about what you can do with MyChart, go to [email or web address].    Your next appointment:   12 month(s)  The format for your next appointment:   In Person  Provider:   Jenkins Rouge, MD      Important Information About Sugar

## 2022-05-13 ENCOUNTER — Other Ambulatory Visit: Payer: Self-pay | Admitting: Cardiovascular Disease

## 2022-05-20 ENCOUNTER — Other Ambulatory Visit: Payer: Self-pay

## 2022-05-20 ENCOUNTER — Inpatient Hospital Stay (HOSPITAL_BASED_OUTPATIENT_CLINIC_OR_DEPARTMENT_OTHER): Payer: Medicare Other | Admitting: Hematology & Oncology

## 2022-05-20 ENCOUNTER — Inpatient Hospital Stay: Payer: Medicare Other | Attending: Hematology & Oncology

## 2022-05-20 ENCOUNTER — Encounter: Payer: Self-pay | Admitting: Hematology & Oncology

## 2022-05-20 VITALS — BP 162/77 | HR 62 | Temp 97.7°F | Resp 18 | Ht 66.0 in | Wt 176.0 lb

## 2022-05-20 DIAGNOSIS — Z79899 Other long term (current) drug therapy: Secondary | ICD-10-CM | POA: Insufficient documentation

## 2022-05-20 DIAGNOSIS — Z8616 Personal history of COVID-19: Secondary | ICD-10-CM | POA: Insufficient documentation

## 2022-05-20 DIAGNOSIS — I252 Old myocardial infarction: Secondary | ICD-10-CM | POA: Insufficient documentation

## 2022-05-20 DIAGNOSIS — E785 Hyperlipidemia, unspecified: Secondary | ICD-10-CM

## 2022-05-20 DIAGNOSIS — R011 Cardiac murmur, unspecified: Secondary | ICD-10-CM | POA: Insufficient documentation

## 2022-05-20 DIAGNOSIS — D649 Anemia, unspecified: Secondary | ICD-10-CM | POA: Diagnosis not present

## 2022-05-20 DIAGNOSIS — E119 Type 2 diabetes mellitus without complications: Secondary | ICD-10-CM

## 2022-05-20 DIAGNOSIS — E039 Hypothyroidism, unspecified: Secondary | ICD-10-CM | POA: Diagnosis not present

## 2022-05-20 DIAGNOSIS — Z7982 Long term (current) use of aspirin: Secondary | ICD-10-CM | POA: Diagnosis not present

## 2022-05-20 DIAGNOSIS — Z7984 Long term (current) use of oral hypoglycemic drugs: Secondary | ICD-10-CM | POA: Diagnosis not present

## 2022-05-20 DIAGNOSIS — Z85828 Personal history of other malignant neoplasm of skin: Secondary | ICD-10-CM | POA: Insufficient documentation

## 2022-05-20 DIAGNOSIS — Z86010 Personal history of colonic polyps: Secondary | ICD-10-CM | POA: Insufficient documentation

## 2022-05-20 DIAGNOSIS — Z79890 Hormone replacement therapy (postmenopausal): Secondary | ICD-10-CM | POA: Diagnosis not present

## 2022-05-20 DIAGNOSIS — N182 Chronic kidney disease, stage 2 (mild): Secondary | ICD-10-CM | POA: Insufficient documentation

## 2022-05-20 DIAGNOSIS — Z87891 Personal history of nicotine dependence: Secondary | ICD-10-CM | POA: Insufficient documentation

## 2022-05-20 DIAGNOSIS — R769 Abnormal immunological finding in serum, unspecified: Secondary | ICD-10-CM | POA: Diagnosis not present

## 2022-05-20 DIAGNOSIS — E1169 Type 2 diabetes mellitus with other specified complication: Secondary | ICD-10-CM

## 2022-05-20 DIAGNOSIS — Z8673 Personal history of transient ischemic attack (TIA), and cerebral infarction without residual deficits: Secondary | ICD-10-CM | POA: Diagnosis not present

## 2022-05-20 DIAGNOSIS — G35 Multiple sclerosis: Secondary | ICD-10-CM | POA: Insufficient documentation

## 2022-05-20 LAB — SAVE SMEAR(SSMR), FOR PROVIDER SLIDE REVIEW

## 2022-05-20 LAB — CMP (CANCER CENTER ONLY)
ALT: 17 U/L (ref 0–44)
AST: 22 U/L (ref 15–41)
Albumin: 4.9 g/dL (ref 3.5–5.0)
Alkaline Phosphatase: 76 U/L (ref 38–126)
Anion gap: 12 (ref 5–15)
BUN: 24 mg/dL — ABNORMAL HIGH (ref 8–23)
CO2: 25 mmol/L (ref 22–32)
Calcium: 10.2 mg/dL (ref 8.9–10.3)
Chloride: 98 mmol/L (ref 98–111)
Creatinine: 1.31 mg/dL — ABNORMAL HIGH (ref 0.44–1.00)
GFR, Estimated: 45 mL/min — ABNORMAL LOW (ref >60)
Glucose, Bld: 235 mg/dL — ABNORMAL HIGH (ref 70–99)
Potassium: 3.4 mmol/L — ABNORMAL LOW (ref 3.5–5.1)
Sodium: 135 mmol/L (ref 135–145)
Total Bilirubin: 0.5 mg/dL (ref 0.3–1.2)
Total Protein: 7.8 g/dL (ref 6.5–8.1)

## 2022-05-20 LAB — CBC WITH DIFFERENTIAL (CANCER CENTER ONLY)
Abs Immature Granulocytes: 0.03 10*3/uL (ref 0.00–0.07)
Basophils Absolute: 0 10*3/uL (ref 0.0–0.1)
Basophils Relative: 0 %
Eosinophils Absolute: 0.3 10*3/uL (ref 0.0–0.5)
Eosinophils Relative: 3 %
HCT: 37.8 % (ref 36.0–46.0)
Hemoglobin: 12.9 g/dL (ref 12.0–15.0)
Immature Granulocytes: 0 %
Lymphocytes Relative: 8 %
Lymphs Abs: 0.7 10*3/uL (ref 0.7–4.0)
MCH: 32.7 pg (ref 26.0–34.0)
MCHC: 34.1 g/dL (ref 30.0–36.0)
MCV: 95.7 fL (ref 80.0–100.0)
Monocytes Absolute: 0.7 10*3/uL (ref 0.1–1.0)
Monocytes Relative: 8 %
Neutro Abs: 7.2 10*3/uL (ref 1.7–7.7)
Neutrophils Relative %: 81 %
Platelet Count: 161 10*3/uL (ref 150–400)
RBC: 3.95 MIL/uL (ref 3.87–5.11)
RDW: 12.8 % (ref 11.5–15.5)
WBC Count: 9 10*3/uL (ref 4.0–10.5)
nRBC: 0 % (ref 0.0–0.2)

## 2022-05-20 LAB — LACTATE DEHYDROGENASE: LDH: 187 U/L (ref 98–192)

## 2022-05-20 NOTE — Progress Notes (Signed)
Referral MD  Reason for Referral: Elevated light chains  Chief Complaint  Patient presents with   New Patient (Initial Visit)  : I may have myeloma.  HPI: Ms. Vasey is a really nice 66 year old white female.  She comes in with her husband.  Both she and her husband are quite funny to talk with.  I must say I really enjoyed talking with them.  She is originally from [facility].  She has been having some problems with her kidneys.  She sees Dr. Raoul Pitch, who is always a very thorough.  Ms. Repetto does have multiple other health issues.  She has history of multiple sclerosis.  She has had this for about 30 years.  She has history of diabetes.  Dr. Raoul Pitch has done some evaluation for the kidneys.  She noted that there is some mild renal insufficiency recently.  She did a work-up.  Part of the work-up was light chains.  She was found to have a elevated kappa and lambda light chain.  She did have a immunofixation done on her urine.  This did not show a monoclonal light chain.  She did a serum protein electrophoresis.  There is no abnormal protein that was obvious.  However, the pathologist said that there may be a very small level that immunofixation could detect.  Because of this, Ms. Pogue was referred to the [facility].  She has a little bit of anemia.  This seems to have resolved.  She has had no problems with fever.  She had no problem with infections.  She has had no change in bowel or bladder habits.  She is up-to-date with her mammogram and colonoscopy.  She has been having a little bit of pain over in the right hip.  She has had no problems with rashes.  There is been no bleeding.  She has had no leg swelling.  Currently, she does not smoke.  She did smoke.  I think she stopped many years ago.  She does have alcohol use but mild.  As far she knows, there is no history of blood problems in the family.  Currently, I would say performance status is  probably ECOG 0.    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/31/2019   Goiter, unspecified    Heart murmur    History of colonic polyps 2012   Multiple sclerosis (HCC)    Non-ST elevated myocardial infarction (non-STEMI) (HCC)    Olecranon bursitis of left elbow 10/17/2017   Oral mucositis 08/31/2019   SCCA (squamous cell carcinoma) of skin    right shin   SEMI (subendocardial myocardial infarction) ([place]) 09/29/2009   TOTAL RCA WITH STENT TO OM BRANCH   Shingles    Squamous cell carcinoma 2019   lower ext   Traumatic rupture of volar plate of left ring finger 03/25/2020   Unspecified essential hypertension    Unspecified hypothyroidism   :   Past Surgical History:  Procedure Laterality Date   CATARACT SURGERY  07/2009   CESAREAN SECTION     CORONARY ANGIOPLASTY WITH STENT PLACEMENT  2011   HAND SURGERY Left 09/2021   tendon surgery   KNEE SURGERY     leg surgery     squamous ccell removed    TENOSYNOVECTOMY Left 10/26/2021  Procedure: LEFT WRIST EXTENSOR TENOSYNOVECTOMY;  Surgeon: Betha Loa, MD;  Location:  [facility];  Service: Orthopedics;  Laterality: Left;  45 MIN   TONSILLECTOMY     VESICOVAGINAL FISTULA CLOSURE W/ TAH    :   Current Outpatient Medications:    amLODipine (NORVASC) 10 MG tablet, TAKE 1 TABLET BY MOUTH EVERY DAY, Disp: 90 tablet, Rfl: 3   aspirin EC 81 MG tablet, Take 1 tablet (81 mg total) by mouth daily. Swallow whole., Disp: 90 tablet, Rfl: 3   B Complex Vitamins (B COMPLEX-B12 PO), Take by mouth., Disp: , Rfl:    carvedilol (COREG) 12.5 MG tablet, Take 1 tablet (12.5 mg total) by mouth 2 (two) times daily., Disp: 180 tablet, Rfl: 1   cholecalciferol (VITAMIN D) 1000 units tablet, Take 2,000 Units by mouth daily., Disp: , Rfl:    fenofibrate 160 MG tablet, Take 1 tablet (160 mg  total) by mouth daily., Disp: 90 tablet, Rfl: 3   Ferrous Sulfate (IRON) 325 (65 FE) MG TABS, Take 325 mg by mouth daily., Disp: , Rfl:    fish oil-omega-3 fatty acids 1000 MG capsule, Take 1 g by mouth daily., Disp: , Rfl:    glimepiride (AMARYL) 4 MG tablet, TAKE 1 TABLET BY MOUTH TWICE A DAY, Disp: 180 tablet, Rfl: 1   hydrochlorothiazide (HYDRODIURIL) 50 MG tablet, Take 1 tablet (50 mg total) by mouth daily., Disp: 90 tablet, Rfl: 1   levothyroxine (SYNTHROID) 112 MCG tablet, Take 1 tablet (112 mcg total) by mouth daily., Disp: 90 tablet, Rfl: 0   losartan (COZAAR) 100 MG tablet, TAKE 1 TABLET BY MOUTH DAILY. MUST KEEP UPCOMING APPOINTMENT FOR FUTURE REFILLS. THANK YOU., Disp: 30 tablet, Rfl: 2   metFORMIN (GLUCOPHAGE) 1000 MG tablet, Take 1 tablet (1,000 mg total) by mouth daily with breakfast., Disp: 90 tablet, Rfl: 1   mirtazapine (REMERON) 15 MG tablet, TAKE 1 TABLET BY MOUTH EVERYDAY AT BEDTIME, Disp: 90 tablet, Rfl: 1   nitroGLYCERIN (NITROSTAT) 0.4 MG SL tablet, Place 1 tablet (0.4 mg total) under the tongue every 5 (five) minutes x 3 doses as needed for chest pain., Disp: 25 tablet, Rfl: 4   pantoprazole (PROTONIX) 20 MG tablet, Take 1 tablet (20 mg total) by mouth daily., Disp: 90 tablet, Rfl: 1   potassium chloride (KLOR-CON) 10 MEQ tablet, Take 1 tablet (10 mEq total) by mouth daily., Disp: 90 tablet, Rfl: 0   rosuvastatin (CRESTOR) 20 MG tablet, Take 1 tablet (20 mg total) by mouth daily., Disp: 90 tablet, Rfl: 0   venlafaxine XR (EFFEXOR XR) 150 MG 24 hr capsule, Take 1 capsule (150 mg total) by mouth daily with breakfast., Disp: 90 capsule, Rfl: 1:  :   Allergies  Allergen Reactions   Aubagio [Teriflunomide] Diarrhea    Pancreatitis, neuropathy, elevated glucose   Betaseron [Interferon Beta-1b] Other (See Comments)    Increased LFT's   Provigil [Modafinil] Swelling    Tongue swelling and sores   Topamax [Topiramate]     Cognitive slowing   Doxycycline Other (See  Comments)    Thrush,dizziness  :   Family History  Problem Relation Age of Onset   Hypertension Mother    Heart failure Father    Diabetes Father    Aneurysm Brother        THORACIC/ABD ANEURYSM   Prostate cancer Brother 50   Diabetes Brother    Fibromyalgia Sister    Brain cancer Maternal Grandmother 20   Multiple sclerosis Neg Hx  Colon cancer Neg Hx    Esophageal cancer Neg Hx   :   Social History   Socioeconomic History   Marital status: Married    Spouse name: Not on file   Number of children: 2   Years of education: 14   Highest education level: Associate degree: academic program  Occupational History   Occupation: Therapist, music  Tobacco Use   Smoking status: Former    Packs/day: 1.00    Years: 15.00    Total pack years: 15.00    Types: Cigarettes    Quit date: 12/16/2015    Years since quitting: 6.4   Smokeless tobacco: Never   Tobacco comments:    on and off smoker  Vaping Use   Vaping Use: Never used  Substance and Sexual Activity   Alcohol use: Yes    Alcohol/week: 0.0 standard drinks of alcohol    Comment: OCCASIONAL ALCOHOL USE.   Drug use: No   Sexual activity: Yes  Other Topics Concern   Not on file  Social History Narrative   The patient lives with husband. 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    Patient drinks occasionally, does not chew tobacco, does not use recreational drugs. She does drink occasional caffeine. She does wear her seatbelt. She does wear bike helmet riding a bike. She does exercise 3 times a week. She does not wear hearing aids or dentures. There is a smoke alarm in her home. There are no firearms in her home. She feels safe in her relationship. She has never experienced physical abuse.   Patient sleeps 7-8 hours a night.   Patient drinks 2 cups of caffeine daily.   Patient is right handed.   Social Determinants of Health   Financial Resource Strain: Low Risk  (12/02/2021)    Overall Financial Resource Strain (CARDIA)    Difficulty of Paying Living Expenses: Not hard at all  Food Insecurity: No Food Insecurity (12/02/2021)   Hunger Vital Sign    Worried About Running Out of Food in the Last Year: Never true    Ran Out of Food in the Last Year: Never true  Transportation Needs: No Transportation Needs (12/02/2021)   PRAPARE - Hydrologist (Medical): No    Lack of Transportation (Non-Medical): No  Physical Activity: Inactive (12/02/2021)   Exercise Vital Sign    Days of Exercise per Week: 0 days    Minutes of Exercise per Session: 0 min  Stress: No Stress Concern Present (12/02/2021)   [place]    Feeling of Stress : Not at all  Social Connections: Moderately Integrated (12/02/2021)   Social Connection and Isolation Panel [NHANES]    Frequency of Communication with Friends and Family: More than three times a week    Frequency of Social Gatherings with Friends and Family: More than three times a week    Attends Religious Services: Never    Marine scientist or Organizations: Yes    Attends Archivist Meetings: 1 to 4 times per year    Marital Status: Married  Human resources officer Violence: Not At Risk (12/02/2021)   Humiliation, Afraid, Rape, and Kick questionnaire    Fear of Current or Ex-Partner: No    Emotionally Abused: No    Physically Abused: No    Sexually Abused: No  :  Review of Systems  Constitutional: Negative.   HENT: Negative.  Eyes: Negative.   Respiratory: Negative.    Cardiovascular: Negative.   Gastrointestinal: Negative.   Genitourinary: Negative.   Musculoskeletal:  Positive for joint pain.  Skin: Negative.   Neurological: Negative.   Endo/Heme/Allergies: Negative.   Psychiatric/Behavioral: Negative.       Exam: Vital signs show temperature 97.7.  Pulse 62.  Blood pressure 162/77.  Weight is 176 pounds.  $Remove'@IPVITALS'YoLdzba$ @ Physical  Exam Vitals reviewed.  HENT:     Head: Normocephalic and atraumatic.  Eyes:     Pupils: Pupils are equal, round, and reactive to light.  Cardiovascular:     Rate and Rhythm: Normal rate and regular rhythm.     Heart sounds: Normal heart sounds.  Pulmonary:     Effort: Pulmonary effort is normal.     Breath sounds: Normal breath sounds.  Abdominal:     General: Bowel sounds are normal.     Palpations: Abdomen is soft.  Musculoskeletal:        General: No tenderness or deformity. Normal range of motion.     Cervical back: Normal range of motion.  Lymphadenopathy:     Cervical: No cervical adenopathy.  Skin:    General: Skin is warm and dry.     Findings: No erythema or rash.  Neurological:     Mental Status: She is alert and oriented to person, place, and time.  Psychiatric:        Behavior: Behavior normal.        Thought Content: Thought content normal.        Judgment: Judgment normal.     Recent Labs    05/20/22 1339  WBC 9.0  HGB 12.9  HCT 37.8  PLT 161    Recent Labs    05/20/22 1339  NA 135  K 3.4*  CL 98  CO2 25  GLUCOSE 235*  BUN 24*  CREATININE 1.31*  CALCIUM 10.2    Blood smear review: None  Pathology: None    Assessment and Plan: Ms. Gabbert is a very charming 66 year old white female.  I have a hard time believing that she is going to have a myeloma or even monoclonal gammopathy.  I think the key test was that she had immunofixation of her urine which did not show a monoclonal protein in her urine.  We will do a 24-hour urine.  We will see if there is any Bence-Jones protein in the 24-hour urine.  I have to believe that any issue with her kidneys is from her diabetes.  Her blood sugars are on the higher side.  She has had longstanding diabetes.  I really do not think she even needs to have a bone marrow biopsy done.  I think the yield will be incredibly low.  I would even hold off on doing a bone survey on her.  Again we will see what  the monoclonal studies show.  I was reassuring to she and her husband.  Again, I just do not believe that she is going to have a MGUS or any count of light chain issue.  As far as get her back into the office, this will depend on what our studies show.  I answered all of their questions.  I gave her a prayer blanket.  She is very grateful for this.

## 2022-05-21 LAB — ERYTHROPOIETIN: Erythropoietin: 8 m[IU]/mL (ref 2.6–18.5)

## 2022-05-21 LAB — IGG, IGA, IGM
IgA: 274 mg/dL (ref 87–352)
IgG (Immunoglobin G), Serum: 1014 mg/dL (ref 586–1602)
IgM (Immunoglobulin M), Srm: 55 mg/dL (ref 26–217)

## 2022-05-21 LAB — KAPPA/LAMBDA LIGHT CHAINS
Kappa free light chain: 51.1 mg/L — ABNORMAL HIGH (ref 3.3–19.4)
Kappa, lambda light chain ratio: 2.04 — ABNORMAL HIGH (ref 0.26–1.65)
Lambda free light chains: 25.1 mg/L (ref 5.7–26.3)

## 2022-05-23 ENCOUNTER — Other Ambulatory Visit: Payer: Self-pay | Admitting: Family Medicine

## 2022-05-23 DIAGNOSIS — E118 Type 2 diabetes mellitus with unspecified complications: Secondary | ICD-10-CM

## 2022-05-24 ENCOUNTER — Inpatient Hospital Stay: Payer: Medicare Other

## 2022-05-24 DIAGNOSIS — R769 Abnormal immunological finding in serum, unspecified: Secondary | ICD-10-CM | POA: Diagnosis not present

## 2022-05-24 DIAGNOSIS — E1169 Type 2 diabetes mellitus with other specified complication: Secondary | ICD-10-CM

## 2022-05-24 DIAGNOSIS — E785 Hyperlipidemia, unspecified: Secondary | ICD-10-CM

## 2022-05-25 LAB — PROTEIN ELECTROPHORESIS, SERUM, WITH REFLEX
A/G Ratio: 1.3 (ref 0.7–1.7)
Albumin ELP: 4.2 g/dL (ref 2.9–4.4)
Alpha-1-Globulin: 0.2 g/dL (ref 0.0–0.4)
Alpha-2-Globulin: 0.9 g/dL (ref 0.4–1.0)
Beta Globulin: 1.3 g/dL (ref 0.7–1.3)
Gamma Globulin: 0.9 g/dL (ref 0.4–1.8)
Globulin, Total: 3.3 g/dL (ref 2.2–3.9)
Total Protein ELP: 7.5 g/dL (ref 6.0–8.5)

## 2022-05-26 LAB — UPEP/UIFE/LIGHT CHAINS/TP, 24-HR UR
% BETA, Urine: 10.2 %
ALPHA 1 URINE: 3.7 %
Albumin, U: 75.3 %
Alpha 2, Urine: 5.8 %
Free Kappa Lt Chains,Ur: 12.33 mg/L (ref 1.17–86.46)
Free Kappa/Lambda Ratio: 5.46 (ref 1.83–14.26)
Free Lambda Lt Chains,Ur: 2.26 mg/L (ref 0.27–15.21)
GAMMA GLOBULIN URINE: 5 %
Total Protein, Urine-Ur/day: 249 mg/24 hr — ABNORMAL HIGH (ref 30–150)
Total Protein, Urine: 8.3 mg/dL
Total Volume: 3000

## 2022-05-27 ENCOUNTER — Encounter: Payer: Self-pay | Admitting: *Deleted

## 2022-06-03 ENCOUNTER — Other Ambulatory Visit: Payer: Self-pay | Admitting: Family Medicine

## 2022-06-04 ENCOUNTER — Other Ambulatory Visit: Payer: Self-pay

## 2022-06-29 ENCOUNTER — Other Ambulatory Visit: Payer: Self-pay | Admitting: Cardiovascular Disease

## 2022-06-29 ENCOUNTER — Other Ambulatory Visit: Payer: Self-pay | Admitting: Family Medicine

## 2022-06-29 DIAGNOSIS — I25119 Atherosclerotic heart disease of native coronary artery with unspecified angina pectoris: Secondary | ICD-10-CM

## 2022-06-30 ENCOUNTER — Other Ambulatory Visit: Payer: Self-pay

## 2022-06-30 MED ORDER — LOSARTAN POTASSIUM 100 MG PO TABS: ORAL_TABLET | ORAL | 3 refills | Status: DC

## 2022-07-01 ENCOUNTER — Encounter: Payer: Self-pay | Admitting: Neurology

## 2022-07-01 ENCOUNTER — Ambulatory Visit (INDEPENDENT_AMBULATORY_CARE_PROVIDER_SITE_OTHER): Payer: Medicare Other | Admitting: Neurology

## 2022-07-01 VITALS — BP 155/66 | HR 62 | Ht 66.0 in | Wt 176.0 lb

## 2022-07-01 DIAGNOSIS — G35 Multiple sclerosis: Secondary | ICD-10-CM

## 2022-07-01 NOTE — Progress Notes (Signed)
Patient: Daisy Bennett Date of Birth: [birth year]  Reason for Visit: Follow up History from: Patient Primary Neurologist: [facility]   ASSESSMENT AND PLAN 66 y.o. year old female   35.  Multiple sclerosis -She is overall doing very well, has no problems or concerns, she remains off MS medication -I will order MRI of the brain with and without contrast for MS surveillance, also check creatinine before hand,creatinine was 1.31 in October, she prefers an open MRI -Will plan for follow-up in 6 months  HISTORY OF PRESENT ILLNESS: 07/01/22 SS: She is doing well. Is off gabapentin, her memory is significantly improved. Rarely left leg bothers her. Feels good. Vision is okay. No weakness to arms or legs. 1 fall, she tripped in the summer, broke both her little fingers. B/B are fine. Feels good energy.  Has no concerns, is glad to be off MS medication.  HISTORY Dr. Felecia Shelling 12/30/2021: HISTORY OF PRESENT ILLNESS:  I had the pleasure of seeing Daisy Bennett at the [facility] at [facility] for a neurologic transfer care regarding her multiple sclerosis.  She had been a patient of Dr. Jannifer Franklin in the past.    She was diagnosed with MS in the 1990s after presenting with hearing loss followed by numbness from the waist down.  She is currently not on any disease modifying therapy.   Currently, her gait  and balance are mildly reduced but she walks without support.     She had a trip/fall last week but this was first fall in a couple years.    She had done PT in past for her balance.   She had another fall in 2017 causing a falcine subdural hematoma.    She no longer has left leg numbness but sensation is mildly altered.    Vision is doing well.   Bladder function is ok though she has occasional urgency.        She notes some fatigue but this usually does not limit her ability to do what she sets out to do.  She usually sleeps well.  She denies depression or major difficulties with cognition.      MS History The patient was diagnosed with multiple sclerosis at [facility] in the 1990s after presenting with left hearing loss.   She had an MRI performed worrisome for MS.   She then had left numbness form the waist down and was officially diagnosed.   She was placed on Betaseron and continued until 2015.  She was seen by Dr. Janann Colonel through our office in 2014 and Dr. Jannifer Franklin between 2015 and 2022.  She had been on Tecfidera but was noted to have low absolute lymphocyte counts and was switched over to Aubagio.  Due to diarrhea, she stopped in September 2022.    She is on metformin but this medication predates the onset of her diarrhea.  REVIEW OF SYSTEMS: Out of a complete 14 system review of symptoms, the patient complains only of the following symptoms, and all other reviewed systems are negative.  See HPI  ALLERGIES: Allergies  Allergen Reactions   Aubagio [Teriflunomide] Diarrhea    Pancreatitis, neuropathy, elevated glucose   Betaseron [Interferon Beta-1b] Other (See Comments)    Increased LFT's   Provigil [Modafinil] Swelling    Tongue swelling and sores   Topamax [Topiramate]     Cognitive slowing   Doxycycline Other (See Comments)    Thrush,dizziness    HOME MEDICATIONS: Outpatient Medications Prior to Visit  Medication Sig  Dispense Refill   amLODipine (NORVASC) 10 MG tablet TAKE 1 TABLET BY MOUTH EVERY DAY 90 tablet 3   aspirin EC 81 MG tablet Take 1 tablet (81 mg total) by mouth daily. Swallow whole. 90 tablet 3   B Complex Vitamins (B COMPLEX-B12 PO) Take by mouth.     carvedilol (COREG) 12.5 MG tablet Take 1 tablet (12.5 mg total) by mouth 2 (two) times daily. 180 tablet 1   cholecalciferol (VITAMIN D) 1000 units tablet Take 2,000 Units by mouth daily.     fenofibrate 160 MG tablet Take 1 tablet (160 mg total) by mouth daily. 90 tablet 3   Ferrous Sulfate (IRON) 325 (65 FE) MG TABS Take 325 mg by mouth daily.     fish oil-omega-3 fatty acids 1000 MG capsule Take  1 g by mouth daily.     glimepiride (AMARYL) 4 MG tablet TAKE 1 TABLET BY MOUTH TWICE A DAY 180 tablet 1   hydrochlorothiazide (HYDRODIURIL) 50 MG tablet Take 1 tablet (50 mg total) by mouth daily. 90 tablet 1   levothyroxine (SYNTHROID) 112 MCG tablet Take 1 tablet (112 mcg total) by mouth daily. 90 tablet 0   losartan (COZAAR) 100 MG tablet TAKE 1 TABLET BY MOUTH DAILY. 90 tablet 3   metFORMIN (GLUCOPHAGE) 1000 MG tablet Take 1 tablet (1,000 mg total) by mouth daily with breakfast. 90 tablet 1   mirtazapine (REMERON) 15 MG tablet TAKE 1 TABLET BY MOUTH EVERYDAY AT BEDTIME 90 tablet 1   nitroGLYCERIN (NITROSTAT) 0.4 MG SL tablet PLACE 1 TABLET UNDER THE TONGUE EVERY 5 (FIVE) MINUTES X 3 DOSES AS NEEDED FOR CHEST PAIN. 25 tablet 4   pantoprazole (PROTONIX) 20 MG tablet Take 1 tablet (20 mg total) by mouth daily. 90 tablet 1   potassium chloride (KLOR-CON) 10 MEQ tablet Take 1 tablet (10 mEq total) by mouth daily. 90 tablet 0   rosuvastatin (CRESTOR) 20 MG tablet Take 1 tablet (20 mg total) by mouth daily. 90 tablet 0   venlafaxine XR (EFFEXOR XR) 150 MG 24 hr capsule Take 1 capsule (150 mg total) by mouth daily with breakfast. 90 capsule 1   No facility-administered medications prior to visit.    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/31/2019   Goiter, unspecified    Heart murmur    History of colonic polyps 2012   Multiple sclerosis (HCC)    Non-ST elevated myocardial infarction (non-STEMI) (HCC)    Olecranon bursitis of left elbow 10/17/2017   Oral mucositis 08/31/2019   SCCA (squamous cell carcinoma) of skin    right shin   SEMI (subendocardial myocardial infarction) ([place]) 09/29/2009   TOTAL RCA WITH STENT TO OM BRANCH   Shingles    Squamous cell carcinoma 2019   lower ext   Traumatic rupture of volar plate  of left ring finger 03/25/2020   Unspecified essential hypertension    Unspecified hypothyroidism     PAST SURGICAL HISTORY: Past Surgical History:  Procedure Laterality Date   CATARACT SURGERY  07/2009   CESAREAN SECTION     CORONARY ANGIOPLASTY WITH STENT PLACEMENT  2011   HAND SURGERY Left 09/2021   tendon surgery   KNEE SURGERY     leg surgery     squamous ccell removed    TENOSYNOVECTOMY  Left 10/26/2021   Procedure: LEFT WRIST EXTENSOR TENOSYNOVECTOMY;  Surgeon: Leanora Cover, MD;  Location: [facility];  Service: Orthopedics;  Laterality: Left;  17 MIN   TONSILLECTOMY     VESICOVAGINAL FISTULA CLOSURE W/ TAH      FAMILY HISTORY: Family History  Problem Relation Age of Onset   Hypertension Mother    Heart failure Father    Diabetes Father    Aneurysm Brother        THORACIC/ABD ANEURYSM   Prostate cancer Brother 60   Diabetes Brother    Fibromyalgia Sister    Brain cancer Maternal Grandmother 44   Multiple sclerosis Neg Hx    Colon cancer Neg Hx    Esophageal cancer Neg Hx     SOCIAL HISTORY: Social History   Socioeconomic History   Marital status: Married    Spouse name: Not on file   Number of children: 2   Years of education: 14   Highest education level: Associate degree: academic program  Occupational History   Occupation: Therapist, music  Tobacco Use   Smoking status: Former    Packs/day: 1.00    Years: 15.00    Total pack years: 15.00    Types: Cigarettes    Quit date: 12/16/2015    Years since quitting: 6.5   Smokeless tobacco: Never   Tobacco comments:    on and off smoker  Vaping Use   Vaping Use: Never used  Substance and Sexual Activity   Alcohol use: Yes    Alcohol/week: 0.0 standard drinks of alcohol    Comment: OCCASIONAL ALCOHOL USE.   Drug use: No   Sexual activity: Yes  Other Topics Concern   Not on file  Social History Narrative   The patient lives with husband. 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    Patient drinks occasionally, does not chew tobacco, does not use recreational drugs. She does drink occasional caffeine. She does wear her seatbelt. She does wear bike helmet riding a bike. She does exercise 3 times a week. She does not wear hearing aids or dentures. There is a smoke alarm in her home. There are no firearms in her home. She feels safe in her relationship. She has never experienced physical abuse.   Patient sleeps 7-8 hours a night.   Patient drinks 2 cups of caffeine daily.   Patient is right handed.   Social Determinants of Health   Financial Resource Strain: Low Risk  (12/02/2021)   Overall Financial Resource Strain (CARDIA)    Difficulty of Paying Living Expenses: Not hard at all  Food Insecurity: No Food Insecurity (12/02/2021)   Hunger Vital Sign    Worried About Running Out of Food in the Last Year: Never true    Ran Out of Food in the Last Year: Never true  Transportation Needs: No Transportation Needs (12/02/2021)   PRAPARE - Hydrologist (Medical): No    Lack of Transportation (Non-Medical): No  Physical Activity: Inactive (12/02/2021)   Exercise Vital Sign    Days of Exercise per Week: 0 days    Minutes of Exercise per Session: 0 min  Stress: No Stress Concern Present (12/02/2021)   [place]    Feeling of Stress : Not at all  Social Connections: Moderately Integrated (12/02/2021)   Social Connection and Isolation Panel [NHANES]    Frequency of Communication with Friends and  Family: More than three times a week    Frequency of Social Gatherings with Friends and Family: More than three times a week    Attends Religious Services: Never    Marine scientist or Organizations: Yes    Attends Archivist Meetings: 1 to 4 times per year    Marital Status: Married  Human resources officer Violence: Not At Risk (12/02/2021)   Humiliation,  Afraid, Rape, and Kick questionnaire    Fear of Current or Ex-Partner: No    Emotionally Abused: No    Physically Abused: No    Sexually Abused: No    PHYSICAL EXAM  Vitals:   07/01/22 1115  BP: (!) 155/66  Pulse: 62  Weight: 176 lb (79.8 kg)  Height: '5\' 6"'$  (1.676 m)   Body mass index is 28.41 kg/m.  Generalized: Well developed, in no acute distress  Neurological examination  Mentation: Alert oriented to time, place, history taking. Follows all commands speech and language fluent Cranial nerve II-XII: Pupils were equal round reactive to light. Extraocular movements were full, visual field were full on confrontational test. Facial sensation and strength were normal.  Head turning and shoulder shrug  were normal and symmetric. Motor: The motor testing reveals 5 over 5 strength of all 4 extremities. Good symmetric motor tone is noted throughout.  Sensory: Sensory testing is intact to soft touch on all 4 extremities. No evidence of extinction is noted.  Coordination: Cerebellar testing reveals good finger-nose-finger and heel-to-shin bilaterally.  Gait and station: Gait is normal. Tandem gait is unsteady. Reflexes: Deep tendon reflexes are symmetric and normal bilaterally.   DIAGNOSTIC DATA (LABS, IMAGING, TESTING) - I reviewed patient records, labs, notes, testing and imaging myself where available.  Lab Results  Component Value Date   WBC 9.0 05/20/2022   HGB 12.9 05/20/2022   HCT 37.8 05/20/2022   MCV 95.7 05/20/2022   PLT 161 05/20/2022      Component Value Date/Time   NA 135 05/20/2022 1339   NA 135 09/22/2020 1351   K 3.4 (L) 05/20/2022 1339   CL 98 05/20/2022 1339   CO2 25 05/20/2022 1339   GLUCOSE 235 (H) 05/20/2022 1339   BUN 24 (H) 05/20/2022 1339   BUN 17 09/22/2020 1351   CREATININE 1.31 (H) 05/20/2022 1339   CREATININE 1.27 (H) 04/28/2022 0816   CALCIUM 10.2 05/20/2022 1339   PROT 7.8 05/20/2022 1339   PROT 7.0 03/23/2021 1108   ALBUMIN 4.9 05/20/2022  1339   ALBUMIN 4.2 03/23/2021 1108   AST 22 05/20/2022 1339   ALT 17 05/20/2022 1339   ALKPHOS 76 05/20/2022 1339   BILITOT 0.5 05/20/2022 1339   GFRNONAA 45 (L) 05/20/2022 1339   GFRAA 77 09/22/2020 1351   Lab Results  Component Value Date   CHOL 168 09/28/2021   HDL 45.80 09/28/2021   LDLCALC 73 07/04/2019   LDLDIRECT 90.0 09/28/2021   TRIG 266.0 (H) 09/28/2021   CHOLHDL 4 09/28/2021   Lab Results  Component Value Date   HGBA1C 7.1 (A) 04/13/2022   HGBA1C 7.1 04/13/2022   HGBA1C 7.1 (A) 04/13/2022   HGBA1C 7.1 (A) 04/13/2022   Lab Results  Component Value Date   LTJQZESP23 300 07/22/2015   Lab Results  Component Value Date   TSH 0.58 09/28/2021    Butler Denmark, AGNP-C, DNP 07/01/2022, 11:38 AM [facility] [address] [place], [place] [address] [phone]

## 2022-07-02 LAB — CREATININE, SERUM
Creatinine, Ser: 1.21 mg/dL — ABNORMAL HIGH (ref 0.57–1.00)
eGFR: 49 mL/min/{1.73_m2} — ABNORMAL LOW (ref >59)

## 2022-07-06 ENCOUNTER — Encounter: Payer: Self-pay | Admitting: Neurology

## 2022-07-07 ENCOUNTER — Telehealth: Payer: Self-pay | Admitting: Neurology

## 2022-07-07 NOTE — Telephone Encounter (Signed)
MRI sent to [facility], phone # [phone]. Medicare: [employer]

## 2022-07-12 ENCOUNTER — Encounter: Payer: Self-pay | Admitting: Neurology

## 2022-07-12 MED ORDER — ALPRAZOLAM 0.5 MG PO TABS: ORAL_TABLET | ORAL | 0 refills | Status: DC

## 2022-07-14 DIAGNOSIS — G35 Multiple sclerosis: Secondary | ICD-10-CM | POA: Diagnosis not present

## 2022-07-20 ENCOUNTER — Telehealth: Payer: Self-pay | Admitting: *Deleted

## 2022-07-20 NOTE — Telephone Encounter (Signed)
See report inbox.

## 2022-07-21 ENCOUNTER — Encounter: Payer: Self-pay | Admitting: Neurology

## 2022-07-21 NOTE — Telephone Encounter (Signed)
MRI of the brain with and without contrast  07/14/22 IMPRESSION:   White matter lesions distribution reflecting history of demyelinating disease. No active enhancing lesion   #  Brain parenchyma: There are scattered and confluent high T2/FLAIR signal lesions within the periventricular and subcortical white matter including in the corpus collosum, distribution reflecting history of demyelinating disease. No infratentorial lesions. No active enhancing lesions.

## 2022-07-29 ENCOUNTER — Other Ambulatory Visit: Payer: Self-pay | Admitting: Family Medicine

## 2022-08-05 ENCOUNTER — Encounter: Payer: Self-pay | Admitting: Family Medicine

## 2022-08-07 ENCOUNTER — Encounter (HOSPITAL_BASED_OUTPATIENT_CLINIC_OR_DEPARTMENT_OTHER): Payer: Self-pay

## 2022-08-07 ENCOUNTER — Emergency Department (HOSPITAL_BASED_OUTPATIENT_CLINIC_OR_DEPARTMENT_OTHER)
Admission: EM | Admit: 2022-08-07 | Discharge: 2022-08-07 | Disposition: A | Discharge status: Home / Self Care | Payer: Medicare HMO | Attending: Emergency Medicine | Admitting: Emergency Medicine

## 2022-08-07 ENCOUNTER — Other Ambulatory Visit: Payer: Self-pay

## 2022-08-07 ENCOUNTER — Emergency Department (HOSPITAL_BASED_OUTPATIENT_CLINIC_OR_DEPARTMENT_OTHER): Payer: Medicare HMO

## 2022-08-07 DIAGNOSIS — W109XXA Fall (on) (from) unspecified stairs and steps, initial encounter: Secondary | ICD-10-CM | POA: Insufficient documentation

## 2022-08-07 DIAGNOSIS — Z7984 Long term (current) use of oral hypoglycemic drugs: Secondary | ICD-10-CM | POA: Insufficient documentation

## 2022-08-07 DIAGNOSIS — Z87891 Personal history of nicotine dependence: Secondary | ICD-10-CM | POA: Diagnosis not present

## 2022-08-07 DIAGNOSIS — Z7982 Long term (current) use of aspirin: Secondary | ICD-10-CM | POA: Diagnosis not present

## 2022-08-07 DIAGNOSIS — S0990XA Unspecified injury of head, initial encounter: Secondary | ICD-10-CM | POA: Diagnosis present

## 2022-08-07 DIAGNOSIS — S066X9A Traumatic subarachnoid hemorrhage with loss of consciousness of unspecified duration, initial encounter: Secondary | ICD-10-CM | POA: Diagnosis not present

## 2022-08-07 DIAGNOSIS — Z955 Presence of coronary angioplasty implant and graft: Secondary | ICD-10-CM | POA: Diagnosis not present

## 2022-08-07 DIAGNOSIS — E119 Type 2 diabetes mellitus without complications: Secondary | ICD-10-CM | POA: Insufficient documentation

## 2022-08-07 DIAGNOSIS — S066X0A Traumatic subarachnoid hemorrhage without loss of consciousness, initial encounter: Secondary | ICD-10-CM | POA: Insufficient documentation

## 2022-08-07 DIAGNOSIS — I10 Essential (primary) hypertension: Secondary | ICD-10-CM | POA: Diagnosis not present

## 2022-08-07 DIAGNOSIS — E039 Hypothyroidism, unspecified: Secondary | ICD-10-CM | POA: Diagnosis not present

## 2022-08-07 DIAGNOSIS — S06350A Traumatic hemorrhage of left cerebrum without loss of consciousness, initial encounter: Secondary | ICD-10-CM

## 2022-08-07 DIAGNOSIS — I609 Nontraumatic subarachnoid hemorrhage, unspecified: Secondary | ICD-10-CM

## 2022-08-07 DIAGNOSIS — Z85828 Personal history of other malignant neoplasm of skin: Secondary | ICD-10-CM | POA: Diagnosis not present

## 2022-08-07 DIAGNOSIS — G35 Multiple sclerosis: Secondary | ICD-10-CM | POA: Diagnosis not present

## 2022-08-07 DIAGNOSIS — S06360A Traumatic hemorrhage of cerebrum, unspecified, without loss of consciousness, initial encounter: Secondary | ICD-10-CM | POA: Diagnosis not present

## 2022-08-07 DIAGNOSIS — N3000 Acute cystitis without hematuria: Secondary | ICD-10-CM | POA: Diagnosis not present

## 2022-08-07 DIAGNOSIS — I2510 Atherosclerotic heart disease of native coronary artery without angina pectoris: Secondary | ICD-10-CM | POA: Insufficient documentation

## 2022-08-07 DIAGNOSIS — Z79899 Other long term (current) drug therapy: Secondary | ICD-10-CM | POA: Diagnosis not present

## 2022-08-07 LAB — URINALYSIS, ROUTINE W REFLEX MICROSCOPIC
Bilirubin Urine: NEGATIVE
Glucose, UA: NEGATIVE mg/dL
Hgb urine dipstick: NEGATIVE
Ketones, ur: NEGATIVE mg/dL
Nitrite: NEGATIVE
Protein, ur: 100 mg/dL — AB
Specific Gravity, Urine: 1.02 (ref 1.005–1.030)
WBC, UA: >50 WBC/hpf — ABNORMAL HIGH (ref 0–5)
pH: 6.5 (ref 5.0–8.0)

## 2022-08-07 MED ORDER — CEPHALEXIN 250 MG PO CAPS: 250.0000 mg | ORAL_CAPSULE | Freq: Four times a day (QID) | ORAL | 0 refills | Status: DC

## 2022-08-07 NOTE — ED Provider Notes (Signed)
[facility] EMERGENCY DEPT Provider Note   CSN: [id number] Arrival date & time: 08/07/22  1154     History  Chief Complaint  Patient presents with   Dysuria    Daisy Bennett is a 67 y.o. female with a past medical history of diabetes, CAD status post stent placement in 2011 not currently on anticoagulants presenting to the emergency room for evaluation of dysuria.  Patient reports she has had increased urinary frequency since last night.  She reports irritation with urination.  She reports that her urine looked cloudy.  She denies any blood in his urine.  History of recurrent UTI last episode was a couple of years ago.  She denies fever, nausea, vomiting, abdominal pain.  Patient reports she suffered from a fall on Sunday when she hit a wooden surface which cause bruises in the right eye.  Denies LOC, nausea, vomiting.   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/31/2019   Goiter, unspecified    Heart murmur    History of colonic polyps 2012   Multiple sclerosis (HCC)    Non-ST elevated myocardial infarction (non-STEMI) ([place])    Olecranon bursitis of left elbow 10/17/2017   Oral mucositis 08/31/2019   SCCA (squamous cell carcinoma) of skin    right shin   SEMI (subendocardial myocardial infarction) ([place]) 09/29/2009   TOTAL RCA WITH STENT TO OM BRANCH   Shingles    Squamous cell carcinoma 2019   lower ext   Traumatic rupture of volar plate of left ring finger 03/25/2020   Unspecified essential hypertension    Unspecified hypothyroidism    Past Surgical History:  Procedure Laterality Date   CATARACT SURGERY  07/2009   CESAREAN SECTION     CORONARY ANGIOPLASTY WITH STENT PLACEMENT  2011   HAND SURGERY Left 09/2021   tendon surgery   KNEE SURGERY     leg surgery     squamous ccell removed     TENOSYNOVECTOMY Left 10/26/2021   Procedure: LEFT WRIST EXTENSOR TENOSYNOVECTOMY;  Surgeon: Leanora Cover, MD;  Location: [facility];  Service: Orthopedics;  Laterality: Left;  77 MIN   TONSILLECTOMY     VESICOVAGINAL FISTULA CLOSURE W/ TAH       Home Medications Prior to Admission medications   Medication Sig Start Date End Date Taking? Authorizing Provider  cephALEXin (KEFLEX) 250 MG capsule Take 1 capsule (250 mg total) by mouth 4 (four) times daily. 08/07/22  Yes Rex Kras, PA  ALPRAZolam Duanne Moron) 0.5 MG tablet Take 1-2 tablets 30 mins prior to MRI 1 additional tab can be used at the time of the study if needed. MUST HAVE DRIVER 56/21/30   [name], NP  amLODipine (NORVASC) 10 MG tablet TAKE 1 TABLET BY MOUTH EVERY DAY 05/13/22   Josue Hector, MD  aspirin EC 81 MG tablet Take 1 tablet (81 mg total) by mouth daily. Swallow whole. 05/11/22   Josue Hector, MD  B Complex Vitamins (B COMPLEX-B12 PO) Take by mouth.    [provider]  carvedilol (COREG) 12.5 MG tablet Take 1 tablet (12.5 mg total) by mouth 2 (two) times daily. 04/13/22   Kuneff, Renee A, DO  cholecalciferol (VITAMIN D) 1000 units tablet Take 2,000 Units by mouth daily.    [provider]  fenofibrate 160 MG tablet Take 1 tablet (160 mg total) by mouth daily. 04/13/22   Kuneff, Renee A, DO  Ferrous Sulfate (IRON) 325 (65 FE) MG TABS Take 325 mg by mouth daily.    [provider]  fish oil-omega-3 fatty acids 1000 MG capsule Take 1 g by mouth daily.    [provider]  glimepiride (AMARYL) 4 MG tablet TAKE 1 TABLET BY MOUTH TWICE A DAY 04/13/22   Kuneff, Renee A, DO  hydrochlorothiazide (HYDRODIURIL) 50 MG tablet Take 1 tablet (50 mg total) by mouth daily. 04/13/22   Kuneff, Renee A, DO  levothyroxine (SYNTHROID) 112 MCG tablet Take 1 tablet (112 mcg total) by mouth daily. 05/11/22   Kuneff, Renee A, DO  losartan (COZAAR) 100 MG tablet TAKE 1 TABLET BY MOUTH DAILY. 06/30/22    Josue Hector, MD  metFORMIN (GLUCOPHAGE) 1000 MG tablet Take 1 tablet (1,000 mg total) by mouth daily with breakfast. 04/13/22   Kuneff, Renee A, DO  mirtazapine (REMERON) 15 MG tablet TAKE 1 TABLET BY MOUTH EVERYDAY AT BEDTIME 04/13/22   Kuneff, Renee A, DO  nitroGLYCERIN (NITROSTAT) 0.4 MG SL tablet PLACE 1 TABLET UNDER THE TONGUE EVERY 5 (FIVE) MINUTES X 3 DOSES AS NEEDED FOR CHEST PAIN. 06/30/22   Josue Hector, MD  pantoprazole (PROTONIX) 20 MG tablet Take 1 tablet (20 mg total) by mouth daily. 04/13/22   Kuneff, Renee A, DO  potassium chloride (KLOR-CON) 10 MEQ tablet Take 1 tablet (10 mEq total) by mouth daily. 05/11/22   Kuneff, Renee A, DO  rosuvastatin (CRESTOR) 20 MG tablet Take 1 tablet (20 mg total) by mouth daily. 05/11/22   Kuneff, Renee A, DO  venlafaxine XR (EFFEXOR XR) 150 MG 24 hr capsule Take 1 capsule (150 mg total) by mouth daily with breakfast. 04/13/22   Kuneff, Renee A, DO      Allergies    Aubagio [teriflunomide], Betaseron [interferon beta-1b], Provigil [modafinil], Topamax [topiramate], and Doxycycline    Review of Systems   Review of Systems  Genitourinary:  Positive for dysuria.    Physical Exam Updated Vital Signs BP (!) 154/69   Pulse 69   Temp 98.8 F (37.1 C)   Resp 15   Ht '5\' 6"'$  (1.676 m)   Wt 77.1 kg   SpO2 100%   BMI 27.44 kg/m  Physical Exam Vitals and nursing note reviewed.  Constitutional:      Appearance: Normal appearance.  HENT:     Head: Normocephalic and atraumatic.     Mouth/Throat:     Mouth: Mucous membranes are moist.  Eyes:     General: No scleral icterus. Cardiovascular:     Rate and Rhythm: Normal rate and regular rhythm.     Pulses: Normal pulses.     Heart sounds: Normal heart sounds.  Pulmonary:     Effort: Pulmonary effort is normal.     Breath sounds: Normal breath sounds.  Abdominal:     General: Abdomen is flat.     Palpations: Abdomen is soft.     Tenderness: There is no abdominal tenderness.   Musculoskeletal:        General: No deformity.  Skin:    General: Skin is warm.     Findings: No rash.     Comments: Healing bruises around the R eye.  Neurological:     General: No focal deficit present.     Mental Status: She is alert.     Comments: Cranial  nerves II through XII intact. Intact sensation to light touch in all 4 extremities. 5/5 strength in all 4 extremities. Intact finger-to-nose and heel-to-shin of all 4 extremities. No visual field cuts. No neglect noted. No aphasia noted.    Psychiatric:        Mood and Affect: Mood normal.     ED Results / Procedures / Treatments   Labs (all labs ordered are listed, but only abnormal results are displayed) Labs Reviewed  URINALYSIS, ROUTINE W REFLEX MICROSCOPIC - Abnormal; Notable for the following components:      Result Value   APPearance HAZY (*)    Protein, ur 100 (*)    Leukocytes,Ua LARGE (*)    WBC, UA >50 (*)    Bacteria, UA FEW (*)    Non Squamous Epithelial 0-5 (*)    All other components within normal limits    EKG None  Radiology CT Head Wo Contrast  Result Date: 08/07/2022 CLINICAL DATA:  Fall.  Hematoma around the right eye. EXAM: CT HEAD WITHOUT CONTRAST TECHNIQUE: Contiguous axial images were obtained from the base of the skull through the vertex without intravenous contrast. RADIATION DOSE REDUCTION: This exam was performed according to the departmental dose-optimization program which includes automated exposure control, adjustment of the mA and/or kV according to patient size and/or use of iterative reconstruction technique. COMPARISON:  Head CT 01/04/2022 and MRI 10/13/2020 FINDINGS: Brain: A left occipital parenchymal hemorrhage measures 1.0 x 1.3 x 0.7 cm ((estimated volume of 0.5 mL), and there is mild surrounding edema without mass effect. A small amount of sulcal subarachnoid hemorrhage is noted laterally over the right frontal convexity. No acute large territory infarct, midline shift, or extra-axial  fluid collection is identified. The ventricles are normal in size. Vascular: Calcified atherosclerosis at the skull base. No hyperdense vessel. Skull: No acute fracture or suspicious osseous lesion. Sinuses/Orbits: Mild right ethmoid air cell mucosal thickening. Clear mastoid air cells. Bilateral cataract extraction. Mild right periorbital soft tissue swelling. Other: None. IMPRESSION: 1. Small left occipital parenchymal hemorrhage/hemorrhagic contusion with mild surrounding edema. 2. Small volume right frontal subarachnoid hemorrhage. Critical Value/emergent results were called by telephone at the time of interpretation on 08/07/2022 at 3:21 pm to Dr. Armandina Gemma, who verbally acknowledged these results. Electronically Signed   By: Logan Bores M.D.   On: 08/07/2022 15:22    Procedures Procedures    Medications Ordered in ED Medications - No data to display  ED Course/ Medical Decision Making/ A&P                           Medical Decision Making Amount and/or Complexity of Data Reviewed Labs: ordered. Radiology: ordered.  Risk Prescription drug management.   This patient presents to the ED for dysuria and fall, this involves an extensive number of treatment options, and is a complaint that carries with a high risk of complications and morbidity.  The differential diagnosis includes UTI, kidney stone, cystitis, nephrolithiasis, infectious etiology.  This is not an exhaustive list.  Lab tests: I ordered and personally interpreted labs.  The pertinent results include: WBC unremarkable. Hbg unremarkable. Platelets unremarkable. No electrolyte abnormalities noted.  BUN, creatinine unremarkable. UA significant for leukocytes.  Imaging studies: I ordered imaging studies. I personally reviewed, interpreted imaging and agree with the radiologist's interpretations. The results include: small left occipital parenchymal hemorrhage/hemorrhagic contusion with mild surrounding edema and small volume  right frontal subarachnoid hemorrhage.  Problem list/ ED course/ Critical  interventions/ Medical management: HPI: See above Vital signs within normal range and stable throughout visit. Laboratory/imaging studies significant for: See above. On physical examination, patient is afebrile and appears in no acute distress.  Urinalysis significant for leukocytes.  CT scan of the head show small left occipital parenchymal hemorrhage contusion and a small volume right frontal subarachnoid hemorrhage.  Neurosurgery consulted.  They recommended outpatient follow-up with primary care physician for [place] and no emergent intervention needed at this point.  Based on patient's clinical presentations and laboratory/imaging studies I suspect UTI. I ordered Keflex for UTI. Advised patient to follow-up with primary care physician for further evaluation and management.  Return to the ER if new or worsening symptoms. I have reviewed the patient home medicines and have made adjustments as needed.  Cardiac monitoring/EKG: The patient was maintained on a cardiac monitor.  I personally reviewed and interpreted the cardiac monitor which showed an underlying rhythm of: sinus rhythm.  Additional history obtained: External records from outside source obtained and reviewed including: Chart review including previous notes, labs, imaging.  Consultations obtained: I requested consultation with Dr. Marcello Moores, and discussed lab and imaging findings as well as pertinent plan.  He recommended outpatient follow up with primary care physician..  Disposition Continued outpatient therapy. Follow-up with PCP recommended for reevaluation of symptoms. Treatment plan discussed with patient.  Pt acknowledged understanding was agreeable to the plan. Worrisome signs and symptoms were discussed with patient, and patient acknowledged understanding to return to the ED if they noticed these signs and symptoms. Patient was stable upon discharge.   This  chart was dictated using voice recognition software.  Despite best efforts to proofread,  errors can occur which can change the documentation meaning.          Final Clinical Impression(s) / ED Diagnoses Final diagnoses:  Acute cystitis without hematuria  Traumatic left-sided intracerebral hemorrhage without loss of consciousness, initial encounter ([place])  Subarachnoid hemorrhage ([place])    Rx / DC Orders ED Discharge Orders          Ordered    cephALEXin (KEFLEX) 250 MG capsule  4 times daily        08/07/22 1735              Rex Kras, PA 08/07/22 2120    Regan Lemming, MD 08/08/22 [phone]

## 2022-08-07 NOTE — ED Triage Notes (Addendum)
Pt arrives POV from home with c/o pain, burning, and frequency with urination that started last night.  Denies any hematuria, denies abdominal or back pain.   Pt has right black eye from a fall up her stairs earlier this week.

## 2022-08-07 NOTE — ED Notes (Signed)
D/c by provider

## 2022-08-07 NOTE — Discharge Instructions (Signed)
Please take your medications as prescribed. I recommend close follow-up with PCP for reevaluation.  Please do not hesitate to return to emergency department if worrisome signs symptoms we discussed become apparent.  

## 2022-08-12 ENCOUNTER — Telehealth: Payer: Self-pay

## 2022-08-12 NOTE — Telephone Encounter (Signed)
     Patient  visit on 08/07/2022  at [facility] was for Acute cystitis without hematuria.  Have you been able to follow up with your primary care physician? Patient has appointment scheduled 08/13/2022.  The patient was or was not able to obtain any needed medicine or equipment. Patient obtained medication.  Are there diet recommendations that you are having difficulty following? No  Patient expresses understanding of discharge instructions and education provided has no other needs at this time.    [facility] Guide   [email or web address]  ?? [phone]   Website: [email or web address]  [email or web address]

## 2022-08-13 ENCOUNTER — Ambulatory Visit (INDEPENDENT_AMBULATORY_CARE_PROVIDER_SITE_OTHER): Payer: Medicare HMO | Admitting: Family Medicine

## 2022-08-13 ENCOUNTER — Encounter: Payer: Self-pay | Admitting: Family Medicine

## 2022-08-13 VITALS — BP 148/62 | HR 65 | Wt 174.2 lb

## 2022-08-13 DIAGNOSIS — E118 Type 2 diabetes mellitus with unspecified complications: Secondary | ICD-10-CM | POA: Diagnosis not present

## 2022-08-13 DIAGNOSIS — D6869 Other thrombophilia: Secondary | ICD-10-CM

## 2022-08-13 DIAGNOSIS — E034 Atrophy of thyroid (acquired): Secondary | ICD-10-CM

## 2022-08-13 DIAGNOSIS — G35 Multiple sclerosis: Secondary | ICD-10-CM

## 2022-08-13 DIAGNOSIS — E1169 Type 2 diabetes mellitus with other specified complication: Secondary | ICD-10-CM

## 2022-08-13 DIAGNOSIS — N1832 Chronic kidney disease, stage 3b: Secondary | ICD-10-CM

## 2022-08-13 DIAGNOSIS — I10 Essential (primary) hypertension: Secondary | ICD-10-CM

## 2022-08-13 DIAGNOSIS — S065XAA Traumatic subdural hemorrhage with loss of consciousness status unknown, initial encounter: Secondary | ICD-10-CM

## 2022-08-13 DIAGNOSIS — F330 Major depressive disorder, recurrent, mild: Secondary | ICD-10-CM

## 2022-08-13 DIAGNOSIS — I25118 Atherosclerotic heart disease of native coronary artery with other forms of angina pectoris: Secondary | ICD-10-CM | POA: Diagnosis not present

## 2022-08-13 DIAGNOSIS — E785 Hyperlipidemia, unspecified: Secondary | ICD-10-CM

## 2022-08-13 LAB — BASIC METABOLIC PANEL
BUN: 21 mg/dL (ref 6–23)
CO2: 28 mEq/L (ref 19–32)
Calcium: 9.8 mg/dL (ref 8.4–10.5)
Chloride: 100 mEq/L (ref 96–112)
Creatinine, Ser: 1.28 mg/dL — ABNORMAL HIGH (ref 0.40–1.20)
GFR: 43.14 mL/min — ABNORMAL LOW (ref >60.00)
Glucose, Bld: 211 mg/dL — ABNORMAL HIGH (ref 70–99)
Potassium: 4.7 mEq/L (ref 3.5–5.1)
Sodium: 138 mEq/L (ref 135–145)

## 2022-08-13 LAB — POCT GLYCOSYLATED HEMOGLOBIN (HGB A1C)
HbA1c POC (<> result, manual entry): 7.6 % (ref 4.0–5.6)
HbA1c, POC (controlled diabetic range): 7.6 % — AB (ref 0.0–7.0)
HbA1c, POC (prediabetic range): 7.6 % — AB (ref 5.7–6.4)
Hemoglobin A1C: 7.6 % — AB (ref 4.0–5.6)

## 2022-08-13 MED ORDER — EMPAGLIFLOZIN 10 MG PO TABS: 10.0000 mg | ORAL_TABLET | Freq: Every day | ORAL | 5 refills | Status: DC

## 2022-08-13 MED ORDER — DICLOFENAC SODIUM 75 MG PO TBEC: 75.0000 mg | DELAYED_RELEASE_TABLET | Freq: Two times a day (BID) | ORAL | 1 refills | Status: DC

## 2022-08-13 MED ORDER — GLIMEPIRIDE 4 MG PO TABS: ORAL_TABLET | ORAL | 1 refills | Status: DC

## 2022-08-13 MED ORDER — ROSUVASTATIN CALCIUM 20 MG PO TABS: 20.0000 mg | ORAL_TABLET | Freq: Every day | ORAL | 1 refills | Status: DC

## 2022-08-13 MED ORDER — PANTOPRAZOLE SODIUM 20 MG PO TBEC: 20.0000 mg | DELAYED_RELEASE_TABLET | Freq: Every day | ORAL | 1 refills | Status: DC

## 2022-08-13 MED ORDER — LEVOTHYROXINE SODIUM 112 MCG PO TABS: 112.0000 ug | ORAL_TABLET | Freq: Every day | ORAL | 1 refills | Status: DC

## 2022-08-13 MED ORDER — METFORMIN HCL 1000 MG PO TABS: 1000.0000 mg | ORAL_TABLET | Freq: Every day | ORAL | 1 refills | Status: DC

## 2022-08-13 MED ORDER — VENLAFAXINE HCL ER 150 MG PO CP24: 150.0000 mg | ORAL_CAPSULE | Freq: Every day | ORAL | 1 refills | Status: DC

## 2022-08-13 MED ORDER — CARVEDILOL 12.5 MG PO TABS: 12.5000 mg | ORAL_TABLET | Freq: Two times a day (BID) | ORAL | 1 refills | Status: DC

## 2022-08-13 NOTE — Patient Instructions (Addendum)
Return in about 15 weeks (around 11/26/2022) for Routine chronic condition follow-up.   Add jardiance and spiro Diclofenac at local pharmacy.     Great to see you today.  I have refilled the medication(s) we provide.   If labs were collected, we will inform you of lab results once received either by echart message or telephone call.   - echart message- for normal results that have been seen by the patient already.   - telephone call: abnormal results or if patient has not viewed results in their echart.

## 2022-08-13 NOTE — Progress Notes (Signed)
Daisy Bennett , [birth year], 67 y.o., female MRN: [id number] Patient Care Team    Relationship Specialty Notifications Start End  Ma Hillock, DO PCP - General Family Medicine  03/10/15   Josue Hector, MD PCP - Cardiology Cardiology Admissions 05/31/18   [place], Jennefer Bravo, MD Referring Physician Dermatology  02/10/16   Kathrynn Ducking, MD (Inactive) Consulting Physician Neurology  05/18/16   Linda Hedges, [place] Physician Obstetrics and Gynecology  06/04/16   Anell Barr, OD  Optometry  08/17/16     Chief Complaint  Patient presents with   Diabetes     Subjective: .Daisy Bennett is a 67 y.o. female present for [facility] follow up. Diabetes/morbid obesity: She reports compliance with Amaryl  4 mg bid daily and metformin 1000 mg qd and she is tolerating both.   Patient denies dizziness, hyperglycemic or hypoglycemic events. Patient denies numbness, tingling in the extremities or nonhealing wounds of feet.    Hypertension/hyperlipidemia: She reports alliance with her medication regimen of Crestor 10 mg daily, Effient 10 mg daily, losartan 100 mg daily, fish oil 1000 mg daily, fenofibrate 160 mg daily Norvasc 10 mg, baby aspirin daily, Coreg 6.25 mg twice daily . H/O CAD, NSTEMI, post stent 2011 (OM)-collateralized RCA.  She follows with Dr. Johnsie Cancel. Patient denies chest pain, shortness of breath, dizziness or lower extremity edema.  Exercise: routine   depression/MS:  Patient reports compliance with Effexor at 150 mg daily and remeron 15 mg qhs.          08/13/2022    8:25 AM 04/13/2022    1:29 PM 12/02/2021   11:01 AM 09/28/2021   10:15 AM 05/27/2021    4:08 PM  Depression screen PHQ 2/9  Decreased Interest 0 0 0 0 0  Down, Depressed, Hopeless 0 0 0 0 0  PHQ - 2 Score 0 0 0 0 0  Altered sleeping  3  1   Tired, decreased energy  0  0   Change in appetite  0  0   Feeling bad or failure about yourself   0  0   Trouble concentrating  0  0   Moving slowly  or fidgety/restless  0  0   Suicidal thoughts  0  0   PHQ-9 Score  3  1     Allergies  Allergen Reactions   Aubagio [Teriflunomide] Diarrhea    Pancreatitis, neuropathy, elevated glucose   Betaseron [Interferon Beta-1b] Other (See Comments)    Increased LFT's   Provigil [Modafinil] Swelling    Tongue swelling and sores   Topamax [Topiramate]     Cognitive slowing   Doxycycline Other (See Comments)    Thrush,dizziness   Social History   Social History Narrative   The patient lives with husband. 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    Patient drinks occasionally, does not chew tobacco, does not use recreational drugs. She does drink occasional caffeine. She does wear her seatbelt. She does wear bike helmet riding a bike. She does exercise 3 times a week. She does not wear hearing aids or dentures. There is a smoke alarm in her home. There are no firearms in her home. She feels safe in her relationship. She has never experienced physical abuse.   Patient sleeps 7-8 hours a night.   Patient drinks 2 cups of caffeine daily.   Patient is right handed.   Past Medical History:  Diagnosis Date  Basal cell carcinoma    Brain bleed ([place]) 2017   COVID-19 08/2019   DM (diabetes mellitus) ([place])    Former smoker    Glossitis, benign migratory 08/31/2019   Goiter, unspecified    Heart murmur    History of colonic polyps 2012   Multiple sclerosis (HCC)    Non-ST elevated myocardial infarction (non-STEMI) (HCC)    Olecranon bursitis of left elbow 10/17/2017   Oral mucositis 08/31/2019   SCCA (squamous cell carcinoma) of skin    right shin   SEMI (subendocardial myocardial infarction) ([place]) 09/29/2009   TOTAL RCA WITH STENT TO OM BRANCH   Shingles    Squamous cell carcinoma 2019   lower ext   Traumatic rupture of volar plate of left ring finger 03/25/2020   Unspecified essential hypertension    Unspecified hypothyroidism    Past Surgical History:  Procedure  Laterality Date   CATARACT SURGERY  07/2009   CESAREAN SECTION     CORONARY ANGIOPLASTY WITH STENT PLACEMENT  2011   HAND SURGERY Left 09/2021   tendon surgery   KNEE SURGERY     leg surgery     squamous ccell removed    TENOSYNOVECTOMY Left 10/26/2021   Procedure: LEFT WRIST EXTENSOR TENOSYNOVECTOMY;  Surgeon: Leanora Cover, MD;  Location: [place];  Service: Orthopedics;  Laterality: Left;  21 MIN   TONSILLECTOMY     VESICOVAGINAL FISTULA CLOSURE W/ TAH     Family History  Problem Relation Age of Onset   Hypertension Mother    Heart failure Father    Diabetes Father    Aneurysm Brother        THORACIC/ABD ANEURYSM   Prostate cancer Brother 1   Diabetes Brother    Fibromyalgia Sister    Brain cancer Maternal Grandmother 63   Multiple sclerosis Neg Hx    Colon cancer Neg Hx    Esophageal cancer Neg Hx    Allergies as of 08/13/2022       Reactions   Aubagio [teriflunomide] Diarrhea   Pancreatitis, neuropathy, elevated glucose   Betaseron [interferon Beta-1b] Other (See Comments)   Increased LFT's   Provigil [modafinil] Swelling   Tongue swelling and sores   Topamax [topiramate]    Cognitive slowing   Doxycycline Other (See Comments)   Thrush,dizziness        Medication List        Accurate as of August 13, 2022  2:23 PM. If you have any questions, ask your nurse or doctor.          STOP taking these medications    ALPRAZolam 0.5 MG tablet Commonly known as: Xanax Stopped by: Howard Pouch, DO   cephALEXin 250 MG capsule Commonly known as: KEFLEX Stopped by: Howard Pouch, DO   hydrochlorothiazide 50 MG tablet Commonly known as: HYDRODIURIL Stopped by: Howard Pouch, DO   mirtazapine 15 MG tablet Commonly known as: REMERON Stopped by: Howard Pouch, DO       TAKE these medications    amLODipine 10 MG tablet Commonly known as: NORVASC TAKE 1 TABLET BY MOUTH EVERY DAY   aspirin EC 81 MG tablet Take 1 tablet (81 mg total) by  mouth daily. Swallow whole.   B COMPLEX-B12 PO Take by mouth.   carvedilol 12.5 MG tablet Commonly known as: COREG Take 1 tablet (12.5 mg total) by mouth 2 (two) times daily.   cholecalciferol 1000 units tablet Commonly known as: VITAMIN D Take 2,000 Units by mouth daily.  diclofenac 75 MG EC tablet Commonly known as: VOLTAREN Take 1 tablet (75 mg total) by mouth 2 (two) times daily. Started by: Howard Pouch, DO   empagliflozin 10 MG Tabs tablet Commonly known as: Jardiance Take 1 tablet (10 mg total) by mouth daily before breakfast. Started by: Howard Pouch, DO   fenofibrate 160 MG tablet Take 1 tablet (160 mg total) by mouth daily.   fish oil-omega-3 fatty acids 1000 MG capsule Take 1 g by mouth daily.   glimepiride 4 MG tablet Commonly known as: AMARYL TAKE 1 TABLET BY MOUTH TWICE A DAY   Iron 325 (65 Fe) MG Tabs Take 325 mg by mouth daily.   levothyroxine 112 MCG tablet Commonly known as: SYNTHROID Take 1 tablet (112 mcg total) by mouth daily.   losartan 100 MG tablet Commonly known as: COZAAR TAKE 1 TABLET BY MOUTH DAILY.   metFORMIN 1000 MG tablet Commonly known as: GLUCOPHAGE Take 1 tablet (1,000 mg total) by mouth daily with breakfast.   nitroGLYCERIN 0.4 MG SL tablet Commonly known as: NITROSTAT PLACE 1 TABLET UNDER THE TONGUE EVERY 5 (FIVE) MINUTES X 3 DOSES AS NEEDED FOR CHEST PAIN.   pantoprazole 20 MG tablet Commonly known as: PROTONIX Take 1 tablet (20 mg total) by mouth daily.   potassium chloride 10 MEQ tablet Commonly known as: KLOR-CON Take 1 tablet (10 mEq total) by mouth daily.   rosuvastatin 20 MG tablet Commonly known as: CRESTOR Take 1 tablet (20 mg total) by mouth daily.   venlafaxine XR 150 MG 24 hr capsule Commonly known as: Effexor XR Take 1 capsule (150 mg total) by mouth daily with breakfast.        All past medical history, surgical history, allergies, family history, immunizations andmedications were updated in  the EMR today and reviewed under the history and medication portions of their EMR.     ROS: Negative, with the exception of above mentioned in HPI   Objective:  BP (!) 148/62   Pulse 65   Wt 174 lb 3.2 oz (79 kg)   BMI 28.12 kg/m  Body mass index is 28.12 kg/m. Physical Exam Vitals and nursing note reviewed.  Constitutional:      General: She is not in acute distress.    Appearance: Normal appearance. She is not ill-appearing, toxic-appearing or diaphoretic.  HENT:     Head: Normocephalic and atraumatic.     Mouth/Throat:     Mouth: Mucous membranes are moist.  Eyes:     General: No scleral icterus.       Right eye: No discharge.        Left eye: No discharge.     Extraocular Movements: Extraocular movements intact.     Conjunctiva/sclera: Conjunctivae normal.     Pupils: Pupils are equal, round, and reactive to light.  Cardiovascular:     Rate and Rhythm: Normal rate and regular rhythm.  Pulmonary:     Effort: Pulmonary effort is normal. No respiratory distress.     Breath sounds: Normal breath sounds. No wheezing, rhonchi or rales.  Musculoskeletal:     Cervical back: Neck supple. No tenderness.     Right lower leg: No edema.     Left lower leg: No edema.  Lymphadenopathy:     Cervical: No cervical adenopathy.  Skin:    General: Skin is warm and dry.     Coloration: Skin is not jaundiced or pale.     Findings: No erythema or rash.  Neurological:  Mental Status: She is alert and oriented to person, place, and time. Mental status is at baseline.     Motor: No weakness.     Gait: Gait normal.  Psychiatric:        Mood and Affect: Mood normal.        Behavior: Behavior normal.        Thought Content: Thought content normal.        Judgment: Judgment normal.    No results found. No results found. Results for orders placed or performed in visit on 08/13/22 (from the past 24 hour(s))  POCT HgB A1C     Status: Abnormal   Collection Time: 08/13/22  8:29 AM   Result Value Ref Range   Hemoglobin A1C 7.6 (A) 4.0 - 5.6 %   HbA1c POC (<> result, manual entry) 7.6 4.0 - 5.6 %   HbA1c, POC (prediabetic range) 7.6 (A) 5.7 - 6.4 %   HbA1c, POC (controlled diabetic range) 7.6 (A) 0.0 - 7.0 %     Assessment/Plan: Daisy Bennett is a 67 y.o. female present for OV for [facility] Diabetes mellitus with complication (HCC)/Morbid obesity Raising A1c. Start jardiance 10 mg qd Continue Amaryl to 4 mg BID Continue metformin 1000 mg daily (decreased secondary to kidney function) - increase exercise regimen and continue dietary modification> cut back on sugary drinks. - tapered off gabapentin (w/ neuro assistance)  POCT HgB A1C--> 5.9> 6.1> 6.6> 8.6> 7.6 > 7.1>7.6 collected today - prevnar 20 completed  -  foot exam completed 04/13/2022 - Eye exam: UTD 02/11/2021 UTD.  Dr. Venetia Constable, yearly eye exams - Microalbumin: Up-to-date - flu shot administered up-to-date 2023  Hypertension/hyperlipidemia/Antiplatelet or antithrombotic long-term use/athersclerosis/hypokalemia/s/p stent placement:  Above goal.  - follows with cardiology, Dr. Johnsie Cancel who prescribes medications.  - low salt, increase exercise.  Continue amlodipine 10 Continue losartan 100 Continue coreg  12.5 mg BID  Add spiro 25 mg daily (can stop kdur 10. She already stopped hctz) Continue fenofibrate Continue statin Continue baby aspirin.   - maintain routine cardiology follow ups.  Bmp today   Depression stable Continue Effexor 150 mg daily -She tapered off gabapentin with neuro assistance  Multiple sclerosis ([place]) Managed by neuro. -currently off all meds  CKD 3 Avoid NSAIDs when able Renally dose meds when appropriate Vitamin D levels up-to-date Diclofenac for temporary knee pain  hypothyroid TSH due next visit Continue levo 112 mcg.    Reviewed expectations re: course of current medical issues. Discussed self-management of symptoms. Outlined signs and symptoms indicating need  for more acute intervention. Patient verbalized understanding and all questions were answered. Patient received an After-Visit Summary.    Orders Placed This Encounter  Procedures   Basic Metabolic Panel (BMET)   POCT HgB A1C    Meds ordered this encounter  Medications   carvedilol (COREG) 12.5 MG tablet    Sig: Take 1 tablet (12.5 mg total) by mouth 2 (two) times daily.    Dispense:  180 tablet    Refill:  1   glimepiride (AMARYL) 4 MG tablet    Sig: TAKE 1 TABLET BY MOUTH TWICE A DAY    Dispense:  180 tablet    Refill:  1   levothyroxine (SYNTHROID) 112 MCG tablet    Sig: Take 1 tablet (112 mcg total) by mouth daily.    Dispense:  90 tablet    Refill:  1   metFORMIN (GLUCOPHAGE) 1000 MG tablet    Sig: Take 1 tablet (1,000  mg total) by mouth daily with breakfast.    Dispense:  90 tablet    Refill:  1   pantoprazole (PROTONIX) 20 MG tablet    Sig: Take 1 tablet (20 mg total) by mouth daily.    Dispense:  90 tablet    Refill:  1   rosuvastatin (CRESTOR) 20 MG tablet    Sig: Take 1 tablet (20 mg total) by mouth daily.    Dispense:  90 tablet    Refill:  1   venlafaxine XR (EFFEXOR XR) 150 MG 24 hr capsule    Sig: Take 1 capsule (150 mg total) by mouth daily with breakfast.    Dispense:  90 capsule    Refill:  1   empagliflozin (JARDIANCE) 10 MG TABS tablet    Sig: Take 1 tablet (10 mg total) by mouth daily before breakfast.    Dispense:  30 tablet    Refill:  5   diclofenac (VOLTAREN) 75 MG EC tablet    Sig: Take 1 tablet (75 mg total) by mouth 2 (two) times daily.    Dispense:  30 tablet    Refill:  1     Referral Orders  No referral(s) requested today   > 45 minutes was dedicated to this patient's encounter to include managing multiple chronic serious conditions with laboratory evaluation, medications and new acute concern.     Note is dictated utilizing voice recognition software. Although note has been proof read prior to signing, occasional typographical  errors still can be missed. If any questions arise, please do not hesitate to call for verification.   electronically signed by:  Howard Pouch, DO  [place]

## 2022-08-16 ENCOUNTER — Encounter: Payer: Self-pay | Admitting: Family Medicine

## 2022-08-16 MED ORDER — SPIRONOLACTONE 25 MG PO TABS: 25.0000 mg | ORAL_TABLET | Freq: Every day | ORAL | 1 refills | Status: DC

## 2022-08-16 NOTE — Telephone Encounter (Signed)
Diclofenac went to [facility] in [facility] went to [facility]  Spironolactone did not go through.  Sent  today to [facility].  Remind her to discontinue the potassium supplement when she starts the spironolactone.

## 2022-08-25 DIAGNOSIS — M1712 Unilateral primary osteoarthritis, left knee: Secondary | ICD-10-CM | POA: Diagnosis not present

## 2022-09-08 DIAGNOSIS — M1712 Unilateral primary osteoarthritis, left knee: Secondary | ICD-10-CM | POA: Diagnosis not present

## 2022-09-14 ENCOUNTER — Encounter: Payer: Self-pay | Admitting: Family Medicine

## 2022-09-15 DIAGNOSIS — M1712 Unilateral primary osteoarthritis, left knee: Secondary | ICD-10-CM | POA: Diagnosis not present

## 2022-09-22 DIAGNOSIS — M1712 Unilateral primary osteoarthritis, left knee: Secondary | ICD-10-CM | POA: Diagnosis not present

## 2022-10-20 DIAGNOSIS — Z01419 Encounter for gynecological examination (general) (routine) without abnormal findings: Secondary | ICD-10-CM | POA: Diagnosis not present

## 2022-10-20 DIAGNOSIS — Z1231 Encounter for screening mammogram for malignant neoplasm of breast: Secondary | ICD-10-CM | POA: Diagnosis not present

## 2022-10-20 DIAGNOSIS — Z6828 Body mass index (BMI) 28.0-28.9, adult: Secondary | ICD-10-CM | POA: Diagnosis not present

## 2022-10-20 LAB — HM MAMMOGRAPHY

## 2022-10-29 ENCOUNTER — Ambulatory Visit (INDEPENDENT_AMBULATORY_CARE_PROVIDER_SITE_OTHER): Payer: Medicare HMO

## 2022-10-29 ENCOUNTER — Ambulatory Visit
Admission: EM | Admit: 2022-10-29 | Discharge: 2022-10-29 | Disposition: A | Discharge status: Home / Self Care | Payer: Medicare HMO | Attending: Family Medicine | Admitting: Family Medicine

## 2022-10-29 ENCOUNTER — Encounter: Payer: Self-pay | Admitting: Family Medicine

## 2022-10-29 DIAGNOSIS — R059 Cough, unspecified: Secondary | ICD-10-CM | POA: Diagnosis not present

## 2022-10-29 DIAGNOSIS — R0602 Shortness of breath: Secondary | ICD-10-CM | POA: Diagnosis not present

## 2022-10-29 DIAGNOSIS — J189 Pneumonia, unspecified organism: Secondary | ICD-10-CM | POA: Diagnosis not present

## 2022-10-29 DIAGNOSIS — R509 Fever, unspecified: Secondary | ICD-10-CM

## 2022-10-29 MED ORDER — CEFDINIR 300 MG PO CAPS: ORAL_CAPSULE | ORAL | 0 refills | Status: DC

## 2022-10-29 NOTE — Discharge Instructions (Signed)
Take plain guaifenesin (1200mg  extended release tabs such as Mucinex) twice daily, with plenty of water, for cough and congestion.  Get adequate rest.   May take Delsym Cough Suppressant ("12 Hour Cough Relief") at bedtime for nighttime cough.  Stop all antihistamines (Nyquil, etc) for now, and other non-prescription cough/cold preparations.   If symptoms become significantly worse during the night or over the weekend, proceed to the local emergency room.

## 2022-10-29 NOTE — ED Triage Notes (Addendum)
Pt c/o cough and shortness of breath since Monday. Worse when lying down. Fever of 104 Monday. Fever 101.4 last night.  Last dose of fever reducer 8p last night. Taking mucinex, nyquil and zycam prn.

## 2022-10-29 NOTE — ED Provider Notes (Signed)
[facility]    CSN: [id number] Arrival date & time: 10/29/22  [id number]      History   Chief Complaint Chief Complaint  Patient presents with   Cough   Shortness of Breath    HPI Daisy Bennett is a 67 y.o. female.   Patient developed a cold-like illness four days ago with fatigue, sinus congestion, productive cough and fever to 104.  She developed shortness of breath, worse when supine.  Last night she had fever to 101.4.  She denies pleuritic pain.  The history is provided by the patient.    Past Medical History:  Diagnosis Date   Basal cell carcinoma    Brain bleed ([place]) 2017   COVID-19 08/2019   DM (diabetes mellitus) ([place])    Former smoker    Glossitis, benign migratory 08/31/2019   Goiter, unspecified    Heart murmur    History of colonic polyps 2012   Multiple sclerosis ([place])    Non-ST elevated myocardial infarction (non-STEMI) (HCC)    Olecranon bursitis of left elbow 10/17/2017   Oral mucositis 08/31/2019   SCCA (squamous cell carcinoma) of skin    right shin   SEMI (subendocardial myocardial infarction) ([place]) 09/29/2009   TOTAL RCA WITH STENT TO OM BRANCH   Shingles    Squamous cell carcinoma 2019   lower ext   Traumatic rupture of volar plate of left ring finger 03/25/2020   Unspecified essential hypertension    Unspecified hypothyroidism     Patient Active Problem List   Diagnosis Date Noted   Major depressive disorder, recurrent episode, mild ([place]) 09/28/2021   Squamous cell cancer of skin of right forearm 09/28/2021   Cellulitis of finger of right hand 09/28/2021   Diarrhea 08/12/2021   Unintentional weight loss 04/02/2021   Chronic kidney disease, stage 3b ([place]) 03/03/2021   Type 2 diabetes mellitus with hyperlipidemia ([place]) 02/14/2017   Acquired thrombophilia ([place])- chronic anticoag 05/18/2016   Subdural hematoma ([place]) 05/18/2016   History of colon polyps 12/15/2015   Coronary atherosclerosis 10/23/2009   CARDIAC MURMUR  10/23/2009   Hypothyroidism 10/15/2009   Multiple sclerosis ([place]) 10/15/2009   Essential hypertension 10/15/2009    Past Surgical History:  Procedure Laterality Date   CATARACT SURGERY  07/2009   CESAREAN SECTION     CORONARY ANGIOPLASTY WITH STENT PLACEMENT  2011   HAND SURGERY Left 09/2021   tendon surgery   KNEE SURGERY     leg surgery     squamous ccell removed    TENOSYNOVECTOMY Left 10/26/2021   Procedure: LEFT WRIST EXTENSOR TENOSYNOVECTOMY;  Surgeon: Leanora Cover, MD;  Location: [facility];  Service: Orthopedics;  Laterality: Left;  74 MIN   TONSILLECTOMY     VESICOVAGINAL FISTULA CLOSURE W/ TAH      OB History   No obstetric history on file.      Home Medications    Prior to Admission medications   Medication Sig Start Date End Date Taking? Authorizing Provider  cefdinir (OMNICEF) 300 MG capsule Take one cap PO Q12hr for one week 10/29/22  Yes Kandra Nicolas, MD  amLODipine (NORVASC) 10 MG tablet TAKE 1 TABLET BY MOUTH EVERY DAY 05/13/22   Josue Hector, MD  aspirin EC 81 MG tablet Take 1 tablet (81 mg total) by mouth daily. Swallow whole. 05/11/22   Josue Hector, MD  B Complex Vitamins (B COMPLEX-B12 PO) Take by mouth.    [provider]  carvedilol (COREG)  12.5 MG tablet Take 1 tablet (12.5 mg total) by mouth 2 (two) times daily. 08/13/22   Kuneff, Renee A, DO  cholecalciferol (VITAMIN D) 1000 units tablet Take 2,000 Units by mouth daily.    [provider]  diclofenac (VOLTAREN) 75 MG EC tablet Take 1 tablet (75 mg total) by mouth 2 (two) times daily. 08/13/22   Kuneff, Renee A, DO  empagliflozin (JARDIANCE) 10 MG TABS tablet Take 1 tablet (10 mg total) by mouth daily before breakfast. 08/13/22   Kuneff, Renee A, DO  fenofibrate 160 MG tablet Take 1 tablet (160 mg total) by mouth daily. 04/13/22   Kuneff, Renee A, DO  Ferrous Sulfate (IRON) 325 (65 FE) MG TABS Take 325 mg by mouth daily.    [provider]  fish  oil-omega-3 fatty acids 1000 MG capsule Take 1 g by mouth daily.    [provider]  glimepiride (AMARYL) 4 MG tablet TAKE 1 TABLET BY MOUTH TWICE A DAY 08/13/22   Kuneff, Renee A, DO  levothyroxine (SYNTHROID) 112 MCG tablet Take 1 tablet (112 mcg total) by mouth daily. 08/13/22   Kuneff, Renee A, DO  losartan (COZAAR) 100 MG tablet TAKE 1 TABLET BY MOUTH DAILY. 06/30/22   Josue Hector, MD  metFORMIN (GLUCOPHAGE) 1000 MG tablet Take 1 tablet (1,000 mg total) by mouth daily with breakfast. 08/13/22   Kuneff, Renee A, DO  nitroGLYCERIN (NITROSTAT) 0.4 MG SL tablet PLACE 1 TABLET UNDER THE TONGUE EVERY 5 (FIVE) MINUTES X 3 DOSES AS NEEDED FOR CHEST PAIN. 06/30/22   Josue Hector, MD  pantoprazole (PROTONIX) 20 MG tablet Take 1 tablet (20 mg total) by mouth daily. 08/13/22   Kuneff, Renee A, DO  rosuvastatin (CRESTOR) 20 MG tablet Take 1 tablet (20 mg total) by mouth daily. 08/13/22   Kuneff, Renee A, DO  spironolactone (ALDACTONE) 25 MG tablet Take 1 tablet (25 mg total) by mouth daily. 08/16/22   Kuneff, Renee A, DO  venlafaxine XR (EFFEXOR XR) 150 MG 24 hr capsule Take 1 capsule (150 mg total) by mouth daily with breakfast. 08/13/22   Raoul Pitch, Renee A, DO    Family History Family History  Problem Relation Age of Onset   Hypertension Mother    Heart failure Father    Diabetes Father    Aneurysm Brother        THORACIC/ABD ANEURYSM   Prostate cancer Brother 51   Diabetes Brother    Fibromyalgia Sister    Brain cancer Maternal Grandmother 84   Multiple sclerosis Neg Hx    Colon cancer Neg Hx    Esophageal cancer Neg Hx     Social History Social History   Tobacco Use   Smoking status: Former    Packs/day: 1.00    Years: 15.00    Additional pack years: 0.00    Total pack years: 15.00    Types: Cigarettes    Quit date: 12/16/2015    Years since quitting: 6.8   Smokeless tobacco: Never   Tobacco comments:    on and off smoker  Vaping Use   Vaping Use: Never used  Substance  Use Topics   Alcohol use: Yes    Alcohol/week: 0.0 standard drinks of alcohol    Comment: OCCASIONAL ALCOHOL USE.   Drug use: No     Allergies   Aubagio [teriflunomide], Betaseron [interferon beta-1b], Provigil [modafinil], Topamax [topiramate], and Doxycycline   Review of Systems Review of Systems No sore throat + cough No pleuritic  pain No wheezing + nasal congestion + post-nasal drainage No sinus pain/pressure No itchy/red eyes No earache No hemoptysis + SOB + fever, + chills No nausea No vomiting No abdominal pain No diarrhea No urinary symptoms No skin rash + fatigue No myalgias No headache Used OTC meds (Coricidin, Mucinex, Theraflu, Nyquil) without relief   Physical Exam Triage Vital Signs ED Triage Vitals [10/29/22 1021]  Enc Vitals Group     BP 129/78     Pulse Rate 67     Resp 17     Temp 98.3 F (36.8 C)     Temp Source Oral     SpO2 96 %     Weight      Height      Head Circumference      Peak Flow      Pain Score 0     Pain Loc      Pain Edu?      Excl. in [place]?    No data found.  Updated Vital Signs BP 129/78 (BP Location: Right Arm)   Pulse 67   Temp 98.3 F (36.8 C) (Oral)   Resp 17   SpO2 96%   Visual Acuity Right Eye Distance:   Left Eye Distance:   Bilateral Distance:    Right Eye Near:   Left Eye Near:    Bilateral Near:     Physical Exam Nursing notes and Vital Signs reviewed. Appearance:  Patient appears stated age, and in no acute distress Eyes:  Pupils are equal, round, and reactive to light and accomodation.  Extraocular movement is intact.  Conjunctivae are not inflamed  Ears:  Canals normal.  Tympanic membranes normal.  Nose:  Mildly congested turbinates.  No sinus tenderness.  Pharynx:  Normal Neck:  Supple.  Mildly enlarged lateral nodes are present, tender to palpation on the left.   Lungs:  Clear to auscultation.  Breath sounds are equal.  Moving air well. Heart:  Regular rate and rhythm without murmurs,  rubs, or gallops.  Abdomen:  Nontender without masses or hepatosplenomegaly.  Bowel sounds are present.  No CVA or flank tenderness.  Extremities:  No edema.  Skin:  No rash present.   UC Treatments / Results  Labs (all labs ordered are listed, but only abnormal results are displayed) Labs Reviewed - No data to display  EKG   Radiology DG Chest 2 View  Result Date: 10/29/2022 CLINICAL DATA:  Shortness of breath, fever, cough. EXAM: CHEST - 2 VIEW COMPARISON:  April 17, 2021. FINDINGS: The heart size and mediastinal contours are within normal limits. Right lung is clear. Mild left basilar atelectasis or infiltrate is noted. The visualized skeletal structures are unremarkable. IMPRESSION: Mild left basilar subsegmental atelectasis or pneumonia is noted. Followup PA and lateral chest X-ray is recommended in 3-4 weeks following trial of antibiotic therapy to ensure resolution and exclude underlying malignancy. Electronically Signed   By: Marijo Conception M.D.   On: 10/29/2022 10:43    Procedures Procedures (including critical care time)  Medications Ordered in UC Medications - No data to display  Initial Impression / Assessment and Plan / UC Course  I have reviewed the triage vital signs and the nursing notes.  Pertinent labs & imaging results that were available during my care of the patient were reviewed by me and considered in my medical decision making (see chart for details).    Begin Omnicef 300mg  Q12hr for one week. Followup with Family Doctor in one month  for repeat chest x-ray.  Final Clinical Impressions(s) / UC Diagnoses   Final diagnoses:  Community acquired pneumonia of left lower lobe of lung     Discharge Instructions      Take plain guaifenesin (1200mg  extended release tabs such as Mucinex) twice daily, with plenty of water, for cough and congestion.  Get adequate rest.   May take Delsym Cough Suppressant ("12 Hour Cough Relief") at bedtime for nighttime  cough.  Stop all antihistamines (Nyquil, etc) for now, and other non-prescription cough/cold preparations.   If symptoms become significantly worse during the night or over the weekend, proceed to the local emergency room.     ED Prescriptions     Medication Sig Dispense Auth. Provider   cefdinir (OMNICEF) 300 MG capsule Take one cap PO Q12hr for one week 14 capsule Kandra Nicolas, MD         Kandra Nicolas, MD 10/31/22 [phone]

## 2022-10-30 ENCOUNTER — Telehealth: Payer: Self-pay | Admitting: Emergency Medicine

## 2022-10-30 NOTE — Telephone Encounter (Signed)
Call to see how Daisy Bennett was today. No answer - voice mail left for questions or concerns

## 2022-11-01 NOTE — Telephone Encounter (Signed)
LM for pt to return call to discuss.  

## 2022-11-03 DIAGNOSIS — H04123 Dry eye syndrome of bilateral lacrimal glands: Secondary | ICD-10-CM | POA: Diagnosis not present

## 2022-11-03 DIAGNOSIS — E119 Type 2 diabetes mellitus without complications: Secondary | ICD-10-CM | POA: Diagnosis not present

## 2022-11-03 DIAGNOSIS — H43811 Vitreous degeneration, right eye: Secondary | ICD-10-CM | POA: Diagnosis not present

## 2022-11-03 DIAGNOSIS — H40013 Open angle with borderline findings, low risk, bilateral: Secondary | ICD-10-CM | POA: Diagnosis not present

## 2022-11-03 DIAGNOSIS — H40019 Open angle with borderline findings, low risk, unspecified eye: Secondary | ICD-10-CM | POA: Diagnosis not present

## 2022-11-03 LAB — HM DIABETES EYE EXAM

## 2022-11-08 ENCOUNTER — Other Ambulatory Visit: Payer: Self-pay | Admitting: Family Medicine

## 2022-11-10 ENCOUNTER — Ambulatory Visit: Payer: Medicare HMO | Admitting: Family Medicine

## 2022-11-10 DIAGNOSIS — D225 Melanocytic nevi of trunk: Secondary | ICD-10-CM | POA: Diagnosis not present

## 2022-11-10 DIAGNOSIS — Z85828 Personal history of other malignant neoplasm of skin: Secondary | ICD-10-CM | POA: Diagnosis not present

## 2022-11-10 DIAGNOSIS — L578 Other skin changes due to chronic exposure to nonionizing radiation: Secondary | ICD-10-CM | POA: Diagnosis not present

## 2022-11-10 DIAGNOSIS — L821 Other seborrheic keratosis: Secondary | ICD-10-CM | POA: Diagnosis not present

## 2022-11-10 DIAGNOSIS — L814 Other melanin hyperpigmentation: Secondary | ICD-10-CM | POA: Diagnosis not present

## 2022-11-17 DIAGNOSIS — M7061 Trochanteric bursitis, right hip: Secondary | ICD-10-CM | POA: Diagnosis not present

## 2022-11-26 ENCOUNTER — Ambulatory Visit (INDEPENDENT_AMBULATORY_CARE_PROVIDER_SITE_OTHER): Payer: Medicare HMO | Admitting: Family Medicine

## 2022-11-26 ENCOUNTER — Encounter: Payer: Self-pay | Admitting: Family Medicine

## 2022-11-26 VITALS — BP 115/71 | HR 51 | Temp 97.7°F | Wt 172.0 lb

## 2022-11-26 DIAGNOSIS — F330 Major depressive disorder, recurrent, mild: Secondary | ICD-10-CM

## 2022-11-26 DIAGNOSIS — E559 Vitamin D deficiency, unspecified: Secondary | ICD-10-CM

## 2022-11-26 DIAGNOSIS — G35 Multiple sclerosis: Secondary | ICD-10-CM

## 2022-11-26 DIAGNOSIS — E1169 Type 2 diabetes mellitus with other specified complication: Secondary | ICD-10-CM

## 2022-11-26 DIAGNOSIS — I25118 Atherosclerotic heart disease of native coronary artery with other forms of angina pectoris: Secondary | ICD-10-CM

## 2022-11-26 DIAGNOSIS — E785 Hyperlipidemia, unspecified: Secondary | ICD-10-CM | POA: Diagnosis not present

## 2022-11-26 DIAGNOSIS — N1832 Chronic kidney disease, stage 3b: Secondary | ICD-10-CM

## 2022-11-26 DIAGNOSIS — D6869 Other thrombophilia: Secondary | ICD-10-CM

## 2022-11-26 DIAGNOSIS — E034 Atrophy of thyroid (acquired): Secondary | ICD-10-CM | POA: Diagnosis not present

## 2022-11-26 DIAGNOSIS — Z7984 Long term (current) use of oral hypoglycemic drugs: Secondary | ICD-10-CM | POA: Diagnosis not present

## 2022-11-26 DIAGNOSIS — I10 Essential (primary) hypertension: Secondary | ICD-10-CM

## 2022-11-26 LAB — BASIC METABOLIC PANEL
BUN: 40 mg/dL — ABNORMAL HIGH (ref 6–23)
CO2: 27 mEq/L (ref 19–32)
Calcium: 9.3 mg/dL (ref 8.4–10.5)
Chloride: 100 mEq/L (ref 96–112)
Creatinine, Ser: 1.66 mg/dL — ABNORMAL HIGH (ref 0.40–1.20)
GFR: 31.51 mL/min — ABNORMAL LOW (ref >60.00)
Glucose, Bld: 166 mg/dL — ABNORMAL HIGH (ref 70–99)
Potassium: 4.3 mEq/L (ref 3.5–5.1)
Sodium: 135 mEq/L (ref 135–145)

## 2022-11-26 LAB — POCT GLYCOSYLATED HEMOGLOBIN (HGB A1C)
HbA1c POC (<> result, manual entry): 7.1 % (ref 4.0–5.6)
HbA1c, POC (controlled diabetic range): 7.1 % — AB (ref 0.0–7.0)
HbA1c, POC (prediabetic range): 7.1 % — AB (ref 5.7–6.4)
Hemoglobin A1C: 7.1 % — AB (ref 4.0–5.6)

## 2022-11-26 LAB — TSH: TSH: 1.79 u[IU]/mL (ref 0.35–5.50)

## 2022-11-26 LAB — VITAMIN D 25 HYDROXY (VIT D DEFICIENCY, FRACTURES): VITD: 36.02 ng/mL (ref 30.00–100.00)

## 2022-11-26 MED ORDER — METFORMIN HCL 1000 MG PO TABS: 1000.0000 mg | ORAL_TABLET | Freq: Every day | ORAL | 1 refills | Status: DC

## 2022-11-26 MED ORDER — SPIRONOLACTONE 25 MG PO TABS: 25.0000 mg | ORAL_TABLET | Freq: Every day | ORAL | 1 refills | Status: DC

## 2022-11-26 MED ORDER — FENOFIBRATE 160 MG PO TABS: 160.0000 mg | ORAL_TABLET | Freq: Every day | ORAL | 3 refills | Status: DC

## 2022-11-26 MED ORDER — ROSUVASTATIN CALCIUM 20 MG PO TABS: 20.0000 mg | ORAL_TABLET | Freq: Every day | ORAL | 1 refills | Status: DC

## 2022-11-26 MED ORDER — GLIMEPIRIDE 4 MG PO TABS: ORAL_TABLET | ORAL | 1 refills | Status: DC

## 2022-11-26 MED ORDER — PANTOPRAZOLE SODIUM 20 MG PO TBEC: 20.0000 mg | DELAYED_RELEASE_TABLET | Freq: Every day | ORAL | 1 refills | Status: DC

## 2022-11-26 MED ORDER — CARVEDILOL 12.5 MG PO TABS: 12.5000 mg | ORAL_TABLET | Freq: Two times a day (BID) | ORAL | 1 refills | Status: DC

## 2022-11-26 MED ORDER — EMPAGLIFLOZIN 25 MG PO TABS: 25.0000 mg | ORAL_TABLET | Freq: Every day | ORAL | 1 refills | Status: DC

## 2022-11-26 MED ORDER — VENLAFAXINE HCL ER 150 MG PO CP24: 150.0000 mg | ORAL_CAPSULE | Freq: Every day | ORAL | 1 refills | Status: DC

## 2022-11-26 NOTE — Progress Notes (Signed)
Daisy Bennett , [birth year], 67 y.o., female MRN: [id number] Patient Care Team    Relationship Specialty Notifications Start End  Natalia Leatherwood, DO PCP - General Family Medicine  03/10/15   Wendall Stade, MD PCP - Cardiology Cardiology Admissions 05/31/18   Haver[place], Elvin So, MD Referring Physician Dermatology  02/10/16   York Spaniel, MD (Inactive) Consulting Physician Neurology  05/18/16   Mitchel Honour, DO Consulting Physician Obstetrics and Gynecology  06/04/16   Isla Pence, OD  Optometry  08/17/16     Chief Complaint  Patient presents with   Diabetes    CMCM     Subjective: .Daisy Bennett is a 67 y.o. female present for [facility] follow up. Diabetes/morbid obesity: She reports compliance with Amaryl  4 mg bid daily and metformin 1000 mg qd.  Jardiance 10 mg started last visit and patient is tolerating well. Patient denies dizziness, hyperglycemic or hypoglycemic events. Patient denies numbness, tingling in the extremities or nonhealing wounds of feet.    Hypertension/hyperlipidemia: She reports compliance with her medication regimen of spironolactone 25 mg daily, Crestor 20 mg daily, losartan 100 mg daily, fish oil 1000 mg daily, fenofibrate 160 mg daily Norvasc 10 mg, baby aspirin daily, Coreg 12.5 mg twice daily .   H/O CAD, NSTEMI, post stent 2011 ([facility])-collateralized RCA.  She follows with Dr. Eden Emms.  Patient denies chest pain, shortness of breath, dizziness or lower extremity edema.     depression/MS:  Patient reports compliance with Effexor at 150 mg daily and remeron 15 mg qhs.         08/13/2022    8:25 AM 04/13/2022    1:29 PM 12/02/2021   11:01 AM 09/28/2021   10:15 AM 05/27/2021    4:08 PM  Depression screen PHQ 2/9  Decreased Interest 0 0 0 0 0  Down, Depressed, Hopeless 0 0 0 0 0  PHQ - 2 Score 0 0 0 0 0  Altered sleeping  3  1   Tired, decreased energy  0  0   Change in appetite  0  0   Feeling bad or failure about yourself   0  0    Trouble concentrating  0  0   Moving slowly or fidgety/restless  0  0   Suicidal thoughts  0  0   PHQ-9 Score  3  1     Allergies  Allergen Reactions   Aubagio [Teriflunomide] Diarrhea    Pancreatitis, neuropathy, elevated glucose   Betaseron [Interferon Beta-1b] Other (See Comments)    Increased LFT's   Provigil [Modafinil] Swelling    Tongue swelling and sores   Topamax [Topiramate]     Cognitive slowing   Doxycycline Other (See Comments)    Thrush,dizziness   Social History   Social History Narrative   The patient lives with husband. 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    Patient drinks occasionally, does not chew tobacco, does not use recreational drugs. She does drink occasional caffeine. She does wear her seatbelt. She does wear bike helmet riding a bike. She does exercise 3 times a week. She does not wear hearing aids or dentures. There is a smoke alarm in her home. There are no firearms in her home. She feels safe in her relationship. She has never experienced physical abuse.   Patient sleeps 7-8 hours a night.   Patient drinks 2 cups of caffeine daily.   Patient is  right handed.   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   Goiter, unspecified    Heart murmur    History of colonic polyps 2012   Multiple sclerosis (HCC)    Non-ST elevated myocardial infarction (non-STEMI) (HCC)    Olecranon bursitis of left elbow 10/17/2017   Oral mucositis 08/31/2019   SCCA (squamous cell carcinoma) of skin    right shin   SEMI (subendocardial myocardial infarction) (HCC) 09/29/2009   TOTAL RCA WITH STENT TO OM BRANCH   Shingles    Squamous cell carcinoma 2019   lower ext   Traumatic rupture of volar plate of left ring finger 03/25/2020   Unspecified essential hypertension    Unspecified  hypothyroidism    Past Surgical History:  Procedure Laterality Date   CATARACT SURGERY  07/2009   CESAREAN SECTION     CORONARY ANGIOPLASTY WITH STENT PLACEMENT  2011   HAND SURGERY Left 09/2021   tendon surgery   KNEE SURGERY     leg surgery     squamous ccell removed    TENOSYNOVECTOMY Left 10/26/2021   Procedure: LEFT WRIST EXTENSOR TENOSYNOVECTOMY;  Surgeon: Betha Loa, MD;  Location: [facility];  Service: Orthopedics;  Laterality: Left;  45 MIN   TONSILLECTOMY     VESICOVAGINAL FISTULA CLOSURE W/ TAH     Family History  Problem Relation Age of Onset   Hypertension Mother    Heart failure Father    Diabetes Father    Aneurysm Brother        THORACIC/ABD ANEURYSM   Prostate cancer Brother 29   Diabetes Brother    Fibromyalgia Sister    Brain cancer Maternal Grandmother 67   Multiple sclerosis Neg Hx    Colon cancer Neg Hx    Esophageal cancer Neg Hx    Allergies as of 11/26/2022       Reactions   Aubagio [teriflunomide] Diarrhea   Pancreatitis, neuropathy, elevated glucose   Betaseron [interferon Beta-1b] Other (See Comments)   Increased LFT's   Provigil [modafinil] Swelling   Tongue swelling and sores   Topamax [topiramate]    Cognitive slowing   Doxycycline Other (See Comments)   Thrush,dizziness        Medication List        Accurate as of November 26, 2022  8:44 AM. If you have any questions, ask your nurse or doctor.          STOP taking these medications    cefdinir 300 MG capsule Commonly known as: OMNICEF Stopped by: Felix Pacini, DO   diclofenac 75 MG EC tablet Commonly known as: VOLTAREN Stopped by: Felix Pacini, DO   Iron 325 (65 Fe) MG Tabs Stopped by: Felix Pacini, DO       TAKE these medications    amLODipine 10 MG tablet Commonly known as: NORVASC TAKE 1 TABLET BY MOUTH EVERY DAY   aspirin EC 81 MG tablet Take 1 tablet (81 mg total) by mouth daily. Swallow whole.   B COMPLEX-B12 PO Take by mouth.    carvedilol 12.5 MG tablet Commonly known as: COREG Take 1 tablet (12.5 mg total) by mouth 2 (two) times daily.   cholecalciferol 1000 units tablet Commonly known as: VITAMIN D Take 2,000 Units by mouth daily.   empagliflozin 25 MG  Tabs tablet Commonly known as: Jardiance Take 1 tablet (25 mg total) by mouth daily before breakfast. What changed:  medication strength how much to take Changed by: Felix Pacini, DO   fenofibrate 160 MG tablet Take 1 tablet (160 mg total) by mouth daily.   fish oil-omega-3 fatty acids 1000 MG capsule Take 1 g by mouth daily.   glimepiride 4 MG tablet Commonly known as: AMARYL TAKE 1 TABLET BY MOUTH TWICE A DAY   levothyroxine 112 MCG tablet Commonly known as: SYNTHROID Take 1 tablet (112 mcg total) by mouth daily.   losartan 100 MG tablet Commonly known as: COZAAR TAKE 1 TABLET BY MOUTH DAILY.   metFORMIN 1000 MG tablet Commonly known as: GLUCOPHAGE Take 1 tablet (1,000 mg total) by mouth daily with breakfast.   nitroGLYCERIN 0.4 MG SL tablet Commonly known as: NITROSTAT PLACE 1 TABLET UNDER THE TONGUE EVERY 5 (FIVE) MINUTES X 3 DOSES AS NEEDED FOR CHEST PAIN.   pantoprazole 20 MG tablet Commonly known as: PROTONIX Take 1 tablet (20 mg total) by mouth daily.   rosuvastatin 20 MG tablet Commonly known as: CRESTOR Take 1 tablet (20 mg total) by mouth daily.   spironolactone 25 MG tablet Commonly known as: ALDACTONE Take 1 tablet (25 mg total) by mouth daily.   venlafaxine XR 150 MG 24 hr capsule Commonly known as: Effexor XR Take 1 capsule (150 mg total) by mouth daily with breakfast.        All past medical history, surgical history, allergies, family history, immunizations andmedications were updated in the EMR today and reviewed under the history and medication portions of their EMR.     ROS: Negative, with the exception of above mentioned in HPI   Objective:  BP 115/71   Pulse (!) 51   Temp 97.7 F (36.5 C)   Wt  172 lb (78 kg)   SpO2 100%   BMI 27.76 kg/m  Body mass index is 27.76 kg/m. Physical Exam Vitals and nursing note reviewed.  Constitutional:      General: She is not in acute distress.    Appearance: Normal appearance. She is not ill-appearing, toxic-appearing or diaphoretic.  HENT:     Head: Normocephalic and atraumatic.  Eyes:     General: No scleral icterus.       Right eye: No discharge.        Left eye: No discharge.     Extraocular Movements: Extraocular movements intact.     Conjunctiva/sclera: Conjunctivae normal.     Pupils: Pupils are equal, round, and reactive to light.  Cardiovascular:     Rate and Rhythm: Normal rate and regular rhythm.  Pulmonary:     Effort: Pulmonary effort is normal. No respiratory distress.     Breath sounds: Normal breath sounds. No wheezing, rhonchi or rales.  Musculoskeletal:     Right lower leg: No edema.     Left lower leg: No edema.  Skin:    General: Skin is warm.     Findings: No rash.  Neurological:     Mental Status: She is alert and oriented to person, place, and time. Mental status is at baseline.     Motor: No weakness.     Gait: Gait normal.  Psychiatric:        Mood and Affect: Mood normal.        Behavior: Behavior normal.        Thought Content: Thought content normal.        Judgment:  Judgment normal.    Diabetic Foot Exam - Simple   Simple Foot Form Diabetic Foot exam was performed with the following findings: Yes 11/26/2022  8:36 AM  Visual Inspection No deformities, no ulcerations, no other skin breakdown bilaterally: Yes Sensation Testing Intact to touch and monofilament testing bilaterally: Yes Pulse Check Posterior Tibialis and Dorsalis pulse intact bilaterally: Yes Comments     No results found. No results found. Results for orders placed or performed in visit on 11/26/22 (from the past 24 hour(s))  POCT HgB A1C     Status: Abnormal   Collection Time: 11/26/22  8:33 AM  Result Value Ref Range    Hemoglobin A1C 7.1 (A) 4.0 - 5.6 %   HbA1c POC (<> result, manual entry) 7.1 4.0 - 5.6 %   HbA1c, POC (prediabetic range) 7.1 (A) 5.7 - 6.4 %   HbA1c, POC (controlled diabetic range) 7.1 (A) 0.0 - 7.0 %      Assessment/Plan: AISHA GREENBERGER is a 67 y.o. female present for OV for [facility] Diabetes mellitus with complication (HCC)/Morbid obesity New medications started last visit.  A1c today7.1 Increase jardiance 10 mg qd Continue Amaryl to 4 mg BID You metformin 1000 mg daily (decreased secondary to kidney function) - increase exercise regimen and continue dietary modification> cut back on sugary drinks. - tapered off gabapentin (w/ neuro assistance)  POCT HgB A1C--> 5.9> 6.1> 6.6> 8.6> 7.6 > 7.1>7.6> 7.1 today - prevnar 20 completed  -  foot exam completed 11/26/2022 - Eye exam: UTD 11/2022-  Dr. Jacinto Reap, yearly eye exams. Requested. - Microalbumin: Up-to-date 04/2022 - flu shot administered up-to-date 2023  Hypertension/hyperlipidemia/Antiplatelet or antithrombotic long-term use/athersclerosis/hypokalemia/s/p stent placement:  Improved. - follows with cardiology, Dr. Eden Emms who prescribes medications.  - low salt, increase exercise.  Continue amlodipine 10 Continue losartan 100 Continue coreg  12.5 mg BID  Continue spiro 25 mg daily (can stop kdur 10. She already stopped hctz) Continue fenofibrate Continue statin Continue baby aspirin.   - maintain routine cardiology follow ups.  -BMP collected today  Depression Stable Continue Effexor 150 mg daily  Multiple sclerosis (HCC) Managed by neuro. -currently off all meds  CKD 3 Avoid NSAIDs when able Renally dose meds when appropriate Vitamin D levels collected today  hypothyroid TSH collected today Continue levo 112 mcg. refills will be provided in appropriate dose based on lab result today    Reviewed expectations re: course of current medical issues. Discussed self-management of symptoms. Outlined signs and  symptoms indicating need for more acute intervention. Patient verbalized understanding and all questions were answered. Patient received an After-Visit Summary.    Orders Placed This Encounter  Procedures   TSH   Basic Metabolic Panel (BMET)   Vitamin D (25 hydroxy)   POCT HgB A1C    Meds ordered this encounter  Medications   carvedilol (COREG) 12.5 MG tablet    Sig: Take 1 tablet (12.5 mg total) by mouth 2 (two) times daily.    Dispense:  180 tablet    Refill:  1   empagliflozin (JARDIANCE) 25 MG TABS tablet    Sig: Take 1 tablet (25 mg total) by mouth daily before breakfast.    Dispense:  90 tablet    Refill:  1   fenofibrate 160 MG tablet    Sig: Take 1 tablet (160 mg total) by mouth daily.    Dispense:  90 tablet    Refill:  3   glimepiride (AMARYL) 4 MG tablet  Sig: TAKE 1 TABLET BY MOUTH TWICE A DAY    Dispense:  180 tablet    Refill:  1   metFORMIN (GLUCOPHAGE) 1000 MG tablet    Sig: Take 1 tablet (1,000 mg total) by mouth daily with breakfast.    Dispense:  90 tablet    Refill:  1   pantoprazole (PROTONIX) 20 MG tablet    Sig: Take 1 tablet (20 mg total) by mouth daily.    Dispense:  90 tablet    Refill:  1   rosuvastatin (CRESTOR) 20 MG tablet    Sig: Take 1 tablet (20 mg total) by mouth daily.    Dispense:  90 tablet    Refill:  1   spironolactone (ALDACTONE) 25 MG tablet    Sig: Take 1 tablet (25 mg total) by mouth daily.    Dispense:  90 tablet    Refill:  1   venlafaxine XR (EFFEXOR XR) 150 MG 24 hr capsule    Sig: Take 1 capsule (150 mg total) by mouth daily with breakfast.    Dispense:  90 capsule    Refill:  1     Referral Orders  No referral(s) requested today     Note is dictated utilizing voice recognition software. Although note has been proof read prior to signing, occasional typographical errors still can be missed. If any questions arise, please do not hesitate to call for verification.   electronically signed by:  Felix Pacini,  DO  [facility] - [place]

## 2022-11-26 NOTE — Patient Instructions (Addendum)
Return in about 24 weeks (around 05/13/2023) for Routine chronic condition follow-up.        Great to see you today.  I have refilled the medication(s) we provide.   If labs were collected, we will inform you of lab results once received either by echart message or telephone call.   - echart message- for normal results that have been seen by the patient already.   - telephone call: abnormal results or if patient has not viewed results in their echart.  

## 2022-11-29 ENCOUNTER — Telehealth: Payer: Self-pay | Admitting: Family Medicine

## 2022-11-29 DIAGNOSIS — N1832 Chronic kidney disease, stage 3b: Secondary | ICD-10-CM

## 2022-11-29 NOTE — Telephone Encounter (Signed)
Please call patient Her kidney function has declined over the last 3 months.  I would encourage her to hydrate well and have lab repeated by lab appointment only in 2 weeks.  If kidney function still decreased we need to follow-up in person and discuss next step.  Thyroid and Vitamin D levels are normal

## 2022-11-29 NOTE — Telephone Encounter (Signed)
Pt advised of results. Lab visit scheduled

## 2022-12-03 ENCOUNTER — Encounter: Payer: Self-pay | Admitting: Family Medicine

## 2022-12-03 ENCOUNTER — Ambulatory Visit (HOSPITAL_BASED_OUTPATIENT_CLINIC_OR_DEPARTMENT_OTHER)
Admission: RE | Admit: 2022-12-03 | Discharge: 2022-12-03 | Disposition: A | Discharge status: Home / Self Care | Payer: Medicare HMO | Source: Ambulatory Visit | Attending: Family Medicine | Admitting: Family Medicine

## 2022-12-03 ENCOUNTER — Ambulatory Visit (INDEPENDENT_AMBULATORY_CARE_PROVIDER_SITE_OTHER): Payer: Medicare HMO | Admitting: Family Medicine

## 2022-12-03 VITALS — BP 122/63 | HR 57 | Temp 97.8°F | Wt 173.6 lb

## 2022-12-03 DIAGNOSIS — J189 Pneumonia, unspecified organism: Secondary | ICD-10-CM

## 2022-12-03 NOTE — Patient Instructions (Signed)
No follow-ups on file.        Great to see you today.  I have refilled the medication(s) we provide.   If labs were collected, we will inform you of lab results once received either by echart message or telephone call.   - echart message- for normal results that have been seen by the patient already.   - telephone call: abnormal results or if patient has not viewed results in their echart.  

## 2022-12-03 NOTE — Progress Notes (Signed)
Daisy Bennett , 08-09-1955, 67 y.o., female MRN: [id number] Patient Care Team    Relationship Specialty Notifications Start End  Natalia Leatherwood, DO PCP - General Family Medicine  03/10/15   Wendall Stade, MD PCP - Cardiology Cardiology Admissions 05/31/18   Haverstock, Elvin So, MD Referring Physician Dermatology  02/10/16   York Spaniel, MD (Inactive) Consulting Physician Neurology  05/18/16   Mitchel Honour, DO Consulting Physician Obstetrics and Gynecology  06/04/16   Isla Pence, OD  Optometry  08/17/16     Chief Complaint  Patient presents with   Pneumonia    [facility] f/u from 03/29     Subjective: Daisy Bennett is a 67 y.o. Pt presents for an OV for follow-up on recent urgent care encounter for pneumonia on 10/29/2022.  She was encouraged to follow-up in 1 month with PCP for recheck and repeat x-ray.  Today patient states she feels well. Her energy level is back to her normal . No cough or fever    12/03/2022    9:43 AM 11/26/2022    9:46 AM 08/13/2022    8:25 AM 04/13/2022    1:29 PM 12/02/2021   11:01 AM  Depression screen PHQ 2/9  Decreased Interest 0 0 0 0 0  Down, Depressed, Hopeless 0 0 0 0 0  PHQ - 2 Score 0 0 0 0 0  Altered sleeping    3   Tired, decreased energy    0   Change in appetite    0   Feeling bad or failure about yourself     0   Trouble concentrating    0   Moving slowly or fidgety/restless    0   Suicidal thoughts    0   PHQ-9 Score    3     Allergies  Allergen Reactions   Aubagio [Teriflunomide] Diarrhea    Pancreatitis, neuropathy, elevated glucose   Betaseron [Interferon Beta-1b] Other (See Comments)    Increased LFT's   Provigil [Modafinil] Swelling    Tongue swelling and sores   Topamax [Topiramate]     Cognitive slowing   Doxycycline Other (See Comments)    Thrush,dizziness   Social History   Social History Narrative   The patient lives with husband. 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     Patient drinks occasionally, does not chew tobacco, does not use recreational drugs. She does drink occasional caffeine. She does wear her seatbelt. She does wear bike helmet riding a bike. She does exercise 3 times a week. She does not wear hearing aids or dentures. There is a smoke alarm in her home. There are no firearms in her home. She feels safe in her relationship. She has never experienced physical abuse.   Patient sleeps 7-8 hours a night.   Patient drinks 2 cups of caffeine daily.   Patient is right handed.   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   Goiter, unspecified    Heart murmur    History of colonic polyps 2012   Multiple sclerosis (HCC)    Non-ST elevated myocardial infarction (non-STEMI) (HCC)    Olecranon bursitis of left elbow 10/17/2017   Oral mucositis 08/31/2019   SCCA (squamous cell carcinoma) of  skin    right shin   SEMI (subendocardial myocardial infarction) (HCC) 09/29/2009   TOTAL RCA WITH STENT TO OM BRANCH   Shingles    Squamous cell carcinoma 2019   lower ext   Traumatic rupture of volar plate of left ring finger 03/25/2020   Unspecified essential hypertension    Unspecified hypothyroidism    Past Surgical History:  Procedure Laterality Date   CATARACT SURGERY  07/2009   CESAREAN SECTION     CORONARY ANGIOPLASTY WITH STENT PLACEMENT  2011   HAND SURGERY Left 09/2021   tendon surgery   KNEE SURGERY     leg surgery     squamous ccell removed    TENOSYNOVECTOMY Left 10/26/2021   Procedure: LEFT WRIST EXTENSOR TENOSYNOVECTOMY;  Surgeon: Betha Loa, MD;  Location: [facility];  Service: Orthopedics;  Laterality: Left;  45 MIN   TONSILLECTOMY     VESICOVAGINAL FISTULA CLOSURE W/ TAH     Family History  Problem Relation Age of Onset   Hypertension Mother    Heart failure  Father    Diabetes Father    Aneurysm Brother        THORACIC/ABD ANEURYSM   Prostate cancer Brother 21   Diabetes Brother    Fibromyalgia Sister    Brain cancer Maternal Grandmother 67   Multiple sclerosis Neg Hx    Colon cancer Neg Hx    Esophageal cancer Neg Hx    Allergies as of 12/03/2022       Reactions   Aubagio [teriflunomide] Diarrhea   Pancreatitis, neuropathy, elevated glucose   Betaseron [interferon Beta-1b] Other (See Comments)   Increased LFT's   Provigil [modafinil] Swelling   Tongue swelling and sores   Topamax [topiramate]    Cognitive slowing   Doxycycline Other (See Comments)   Thrush,dizziness        Medication List        Accurate as of Dec 03, 2022  9:52 AM. If you have any questions, ask your nurse or doctor.          amLODipine 10 MG tablet Commonly known as: NORVASC TAKE 1 TABLET BY MOUTH EVERY DAY   aspirin EC 81 MG tablet Take 1 tablet (81 mg total) by mouth daily. Swallow whole.   B COMPLEX-B12 PO Take by mouth.   carvedilol 12.5 MG tablet Commonly known as: COREG Take 1 tablet (12.5 mg total) by mouth 2 (two) times daily.   cholecalciferol 1000 units tablet Commonly known as: VITAMIN D Take 2,000 Units by mouth daily.   empagliflozin 25 MG Tabs tablet Commonly known as: Jardiance Take 1 tablet (25 mg total) by mouth daily before breakfast.   fenofibrate 160 MG tablet Take 1 tablet (160 mg total) by mouth daily.   fish oil-omega-3 fatty acids 1000 MG capsule Take 1 g by mouth daily.   glimepiride 4 MG tablet Commonly known as: AMARYL TAKE 1 TABLET BY MOUTH TWICE A DAY   levothyroxine 112 MCG tablet Commonly known as: SYNTHROID Take 1 tablet (112 mcg total) by mouth daily.   losartan 100 MG tablet Commonly known as: COZAAR TAKE 1 TABLET BY MOUTH DAILY.   metFORMIN 1000 MG tablet Commonly known as: GLUCOPHAGE Take 1 tablet (1,000 mg total) by mouth daily with breakfast.   nitroGLYCERIN 0.4 MG SL tablet Commonly  known as: NITROSTAT PLACE 1 TABLET UNDER THE TONGUE EVERY 5 (FIVE) MINUTES X 3 DOSES AS NEEDED FOR CHEST PAIN.   pantoprazole 20 MG tablet Commonly  known as: PROTONIX Take 1 tablet (20 mg total) by mouth daily.   rosuvastatin 20 MG tablet Commonly known as: CRESTOR Take 1 tablet (20 mg total) by mouth daily.   spironolactone 25 MG tablet Commonly known as: ALDACTONE Take 1 tablet (25 mg total) by mouth daily.   venlafaxine XR 150 MG 24 hr capsule Commonly known as: Effexor XR Take 1 capsule (150 mg total) by mouth daily with breakfast.        All past medical history, surgical history, allergies, family history, immunizations andmedications were updated in the EMR today and reviewed under the history and medication portions of their EMR.     ROS Negative, with the exception of above mentioned in HPI   Objective:  BP 122/63   Pulse (!) 57   Temp 97.8 F (36.6 C)   Wt 173 lb 9.6 oz (78.7 kg)   SpO2 100%   BMI 28.02 kg/m  Body mass index is 28.02 kg/m. Physical Exam Vitals and nursing note reviewed.  Constitutional:      General: She is not in acute distress.    Appearance: Normal appearance. She is not ill-appearing, toxic-appearing or diaphoretic.  HENT:     Head: Normocephalic and atraumatic.  Eyes:     General: No scleral icterus.       Right eye: No discharge.        Left eye: No discharge.     Extraocular Movements: Extraocular movements intact.     Conjunctiva/sclera: Conjunctivae normal.     Pupils: Pupils are equal, round, and reactive to light.  Cardiovascular:     Rate and Rhythm: Normal rate and regular rhythm.  Pulmonary:     Effort: Pulmonary effort is normal. No respiratory distress.     Breath sounds: Normal breath sounds. No wheezing, rhonchi or rales.  Musculoskeletal:     Right lower leg: No edema.     Left lower leg: No edema.  Skin:    Findings: No rash.  Neurological:     Mental Status: She is alert and oriented to person, place, and  time. Mental status is at baseline.     Motor: No weakness.     Gait: Gait normal.  Psychiatric:        Mood and Affect: Mood normal.        Behavior: Behavior normal.        Thought Content: Thought content normal.        Judgment: Judgment normal.     No results found. No results found. No results found for this or any previous visit (from the past 24 hour(s)).  Assessment/Plan: MINDE GASH is a 67 y.o. female present for OV for  Pneumonia due to infectious organism, unspecified laterality, unspecified part of lung Left lower lobe PNA 4 weeks ago.  Lung exam is clear today and pt feels well.  Rpt cxr ordered to ensure clearance of abnormality. - DG Chest 2 View; Future   Reviewed expectations re: course of current medical issues. Discussed self-management of symptoms. Outlined signs and symptoms indicating need for more acute intervention. Patient verbalized understanding and all questions were answered. Patient received an After-Visit Summary.    Orders Placed This Encounter  Procedures   DG Chest 2 View   No orders of the defined types were placed in this encounter.  Referral Orders  No referral(s) requested today     Note is dictated utilizing voice recognition software. Although note has been proof read prior to signing,  occasional typographical errors still can be missed. If any questions arise, please do not hesitate to call for verification.   electronically signed by:  Howard Pouch, DO  [place]

## 2022-12-07 ENCOUNTER — Encounter: Payer: Self-pay | Admitting: Family Medicine

## 2022-12-08 ENCOUNTER — Ambulatory Visit: Payer: Medicare Other

## 2022-12-13 ENCOUNTER — Other Ambulatory Visit (INDEPENDENT_AMBULATORY_CARE_PROVIDER_SITE_OTHER): Payer: Medicare HMO

## 2022-12-13 DIAGNOSIS — N1832 Chronic kidney disease, stage 3b: Secondary | ICD-10-CM | POA: Diagnosis not present

## 2022-12-13 LAB — BASIC METABOLIC PANEL
BUN: 30 mg/dL — ABNORMAL HIGH (ref 6–23)
CO2: 27 mEq/L (ref 19–32)
Calcium: 9.6 mg/dL (ref 8.4–10.5)
Chloride: 102 mEq/L (ref 96–112)
Creatinine, Ser: 1.37 mg/dL — ABNORMAL HIGH (ref 0.40–1.20)
GFR: 39.67 mL/min — ABNORMAL LOW (ref >60.00)
Glucose, Bld: 194 mg/dL — ABNORMAL HIGH (ref 70–99)
Potassium: 4.3 mEq/L (ref 3.5–5.1)
Sodium: 138 mEq/L (ref 135–145)

## 2022-12-29 ENCOUNTER — Ambulatory Visit (INDEPENDENT_AMBULATORY_CARE_PROVIDER_SITE_OTHER): Payer: Medicare HMO

## 2022-12-29 VITALS — Wt 173.0 lb

## 2022-12-29 DIAGNOSIS — M7061 Trochanteric bursitis, right hip: Secondary | ICD-10-CM | POA: Diagnosis not present

## 2022-12-29 DIAGNOSIS — Z0000 Encounter for general adult medical examination without abnormal findings: Secondary | ICD-10-CM

## 2022-12-29 NOTE — Patient Instructions (Signed)
[name] , Thank you for taking time to come for your Medicare Wellness Visit. I appreciate your ongoing commitment to your health goals. Please review the following plan we discussed and let me know if I can assist you in the future.   These are the goals we discussed:  Goals      Patient Stated     Maintain current health     Patient Stated     None at this time     Patient Stated     None at this time         This is a list of the screening recommended for you and due dates:  Health Maintenance  Topic Date Due   Flu Shot  03/03/2023   Yearly kidney health urinalysis for diabetes  04/29/2023   Hemoglobin A1C  05/28/2023   Eye exam for diabetics  11/03/2023   Complete foot exam   11/26/2023   Yearly kidney function blood test for diabetes  12/13/2023   DTaP/Tdap/Td vaccine (2 - Td or Tdap) 12/27/2023   Medicare Annual Wellness Visit  12/29/2023   Mammogram  10/19/2024   DEXA scan (bone density measurement)  02/17/2026   Colon Cancer Screening  07/17/2031   Pneumonia Vaccine  Completed   Hepatitis C Screening  Completed   Zoster (Shingles) Vaccine  Completed   HPV Vaccine  Aged Out   COVID-19 Vaccine  Discontinued    Advanced directives: Please bring a copy of your health care power of attorney and living will to the office at your convenience.  Conditions/risks identified: none at this time   Next appointment: Follow up in one year for your annual wellness visit    Preventive Care 65 Years and Older, Female Preventive care refers to lifestyle choices and visits with your health care provider that can promote health and wellness. What does preventive care include? A yearly physical exam. This is also called an annual well check. Dental exams once or twice a year. Routine eye exams. Ask your health care provider how often you should have your eyes checked. Personal lifestyle choices, including: Daily care of your teeth and gums. Regular physical  activity. Eating a healthy diet. Avoiding tobacco and drug use. Limiting alcohol use. Practicing safe sex. Taking low-dose aspirin every day. Taking vitamin and mineral supplements as recommended by your health care provider. What happens during an annual well check? The services and screenings done by your health care provider during your annual well check will depend on your age, overall health, lifestyle risk factors, and family history of disease. Counseling  Your health care provider may ask you questions about your: Alcohol use. Tobacco use. Drug use. Emotional well-being. Home and relationship well-being. Sexual activity. Eating habits. History of falls. Memory and ability to understand (cognition). Work and work Astronomer. Reproductive health. Screening  You may have the following tests or measurements: Height, weight, and BMI. Blood pressure. Lipid and cholesterol levels. These may be checked every 5 years, or more frequently if you are over 74 years old. Skin check. Lung cancer screening. You may have this screening every year starting at age 41 if you have a 30-pack-year history of smoking and currently smoke or have quit within the past 15 years. Fecal occult blood test (FOBT) of the stool. You may have this test every year starting at age 89. Flexible sigmoidoscopy or colonoscopy. You may have a sigmoidoscopy every 5 years or a colonoscopy every 10 years starting at age 100. Hepatitis  C blood test. Hepatitis B blood test. Sexually transmitted disease (STD) testing. Diabetes screening. This is done by checking your blood sugar (glucose) after you have not eaten for a while (fasting). You may have this done every 1-3 years. Bone density scan. This is done to screen for osteoporosis. You may have this done starting at age 72. Mammogram. This may be done every 1-2 years. Talk to your health care provider about how often you should have regular mammograms. Talk with your  health care provider about your test results, treatment options, and if necessary, the need for more tests. Vaccines  Your health care provider may recommend certain vaccines, such as: Influenza vaccine. This is recommended every year. Tetanus, diphtheria, and acellular pertussis (Tdap, Td) vaccine. You may need a Td booster every 10 years. Zoster vaccine. You may need this after age 59. Pneumococcal 13-valent conjugate (PCV13) vaccine. One dose is recommended after age 69. Pneumococcal polysaccharide (PPSV23) vaccine. One dose is recommended after age 24. Talk to your health care provider about which screenings and vaccines you need and how often you need them. This information is not intended to replace advice given to you by your health care provider. Make sure you discuss any questions you have with your health care provider. Document Released: 08/15/2015 Document Revised: 04/07/2016 Document Reviewed: 05/20/2015 Elsevier Interactive Patient Education  2017 [employer].  Fall Prevention in the Home Falls can cause injuries. They can happen to people of all ages. There are many things you can do to make your home safe and to help prevent falls. What can I do on the outside of my home? Regularly fix the edges of walkways and driveways and fix any cracks. Remove anything that might make you trip as you walk through a door, such as a raised step or threshold. Trim any bushes or trees on the path to your home. Use bright outdoor lighting. Clear any walking paths of anything that might make someone trip, such as rocks or tools. Regularly check to see if handrails are loose or broken. Make sure that both sides of any steps have handrails. Any raised decks and porches should have guardrails on the edges. Have any leaves, snow, or ice cleared regularly. Use sand or salt on walking paths during winter. Clean up any spills in your garage right away. This includes oil or grease spills. What can I  do in the bathroom? Use night lights. Install grab bars by the toilet and in the tub and shower. Do not use towel bars as grab bars. Use non-skid mats or decals in the tub or shower. If you need to sit down in the shower, use a plastic, non-slip stool. Keep the floor dry. Clean up any water that spills on the floor as soon as it happens. Remove soap buildup in the tub or shower regularly. Attach bath mats securely with double-sided non-slip rug tape. Do not have throw rugs and other things on the floor that can make you trip. What can I do in the bedroom? Use night lights. Make sure that you have a light by your bed that is easy to reach. Do not use any sheets or blankets that are too big for your bed. They should not hang down onto the floor. Have a firm chair that has side arms. You can use this for support while you get dressed. Do not have throw rugs and other things on the floor that can make you trip. What can I do in the  kitchen? Clean up any spills right away. Avoid walking on wet floors. Keep items that you use a lot in easy-to-reach places. If you need to reach something above you, use a strong step stool that has a grab bar. Keep electrical cords out of the way. Do not use floor polish or wax that makes floors slippery. If you must use wax, use non-skid floor wax. Do not have throw rugs and other things on the floor that can make you trip. What can I do with my stairs? Do not leave any items on the stairs. Make sure that there are handrails on both sides of the stairs and use them. Fix handrails that are broken or loose. Make sure that handrails are as long as the stairways. Check any carpeting to make sure that it is firmly attached to the stairs. Fix any carpet that is loose or worn. Avoid having throw rugs at the top or bottom of the stairs. If you do have throw rugs, attach them to the floor with carpet tape. Make sure that you have a light switch at the top of the stairs  and the bottom of the stairs. If you do not have them, ask someone to add them for you. What else can I do to help prevent falls? Wear shoes that: Do not have high heels. Have rubber bottoms. Are comfortable and fit you well. Are closed at the toe. Do not wear sandals. If you use a stepladder: Make sure that it is fully opened. Do not climb a closed stepladder. Make sure that both sides of the stepladder are locked into place. Ask someone to hold it for you, if possible. Clearly mark and make sure that you can see: Any grab bars or handrails. First and last steps. Where the edge of each step is. Use tools that help you move around (mobility aids) if they are needed. These include: Canes. Walkers. Scooters. Crutches. Turn on the lights when you go into a dark area. Replace any light bulbs as soon as they burn out. Set up your furniture so you have a clear path. Avoid moving your furniture around. If any of your floors are uneven, fix them. If there are any pets around you, be aware of where they are. Review your medicines with your doctor. Some medicines can make you feel dizzy. This can increase your chance of falling. Ask your doctor what other things that you can do to help prevent falls. This information is not intended to replace advice given to you by your health care provider. Make sure you discuss any questions you have with your health care provider. Document Released: 05/15/2009 Document Revised: 12/25/2015 Document Reviewed: 08/23/2014 Elsevier Interactive Patient Education  2017 [employer].

## 2022-12-29 NOTE — Progress Notes (Addendum)
I connected with  Daisy Bennett on 12/29/22 by a audio enabled telemedicine application and verified that I am speaking with the correct person using two identifiers.  Patient Location: Home  Provider Location: Home Office  I discussed the limitations of evaluation and management by telemedicine. The patient expressed understanding and agreed to proceed.    Patient Medicare AWV questionnaire was completed by the patient on 12/28/22; I have confirmed that all information answered by patient is correct and no changes since this date.      Subjective:   Daisy Bennett is a 67 y.o. female who presents for Medicare Annual (Subsequent) preventive examination.  Review of Systems     Cardiac Risk Factors include: advanced age (>45men, >53 women);diabetes mellitus;dyslipidemia;hypertension     Objective:    Today's Vitals   12/29/22 0903  Weight: 173 lb (78.5 kg)   Body mass index is 27.92 kg/m.     12/29/2022    9:06 AM 08/07/2022   12:23 PM 05/20/2022    2:19 PM 01/04/2022    9:50 PM 12/02/2021   11:03 AM 10/26/2021   11:24 AM 10/17/2021    9:43 AM  Advanced Directives  Does Patient Have a Medical Advance Directive? Yes No No No Yes No No  Type of Estate agent of [place];Living will    Healthcare Power of Attorney    Does patient want to make changes to medical advance directive?   No - Patient declined      Copy of Healthcare Power of Attorney in Chart? No - copy requested    Yes - validated most recent copy scanned in chart (See row information)    Would patient like information on creating a medical advance directive?    No - Patient declined  No - Patient declined No - Patient declined    Current Medications (verified) Outpatient Encounter Medications as of 12/29/2022  Medication Sig   amLODipine (NORVASC) 10 MG tablet TAKE 1 TABLET BY MOUTH EVERY DAY   aspirin EC 81 MG tablet Take 1 tablet (81 mg total) by mouth daily. Swallow whole.   B Complex  Vitamins (B COMPLEX-B12 PO) Take by mouth.   carvedilol (COREG) 12.5 MG tablet Take 1 tablet (12.5 mg total) by mouth 2 (two) times daily.   cholecalciferol (VITAMIN D) 1000 units tablet Take 2,000 Units by mouth daily.   empagliflozin (JARDIANCE) 25 MG TABS tablet Take 1 tablet (25 mg total) by mouth daily before breakfast.   fenofibrate 160 MG tablet Take 1 tablet (160 mg total) by mouth daily.   fish oil-omega-3 fatty acids 1000 MG capsule Take 1 g by mouth daily.   glimepiride (AMARYL) 4 MG tablet TAKE 1 TABLET BY MOUTH TWICE A DAY   levothyroxine (SYNTHROID) 112 MCG tablet Take 1 tablet (112 mcg total) by mouth daily.   losartan (COZAAR) 100 MG tablet TAKE 1 TABLET BY MOUTH DAILY.   metFORMIN (GLUCOPHAGE) 1000 MG tablet Take 1 tablet (1,000 mg total) by mouth daily with breakfast.   nitroGLYCERIN (NITROSTAT) 0.4 MG SL tablet PLACE 1 TABLET UNDER THE TONGUE EVERY 5 (FIVE) MINUTES X 3 DOSES AS NEEDED FOR CHEST PAIN.   pantoprazole (PROTONIX) 20 MG tablet Take 1 tablet (20 mg total) by mouth daily.   rosuvastatin (CRESTOR) 20 MG tablet Take 1 tablet (20 mg total) by mouth daily.   spironolactone (ALDACTONE) 25 MG tablet Take 1 tablet (25 mg total) by mouth daily.   venlafaxine XR (EFFEXOR XR) 150 MG  24 hr capsule Take 1 capsule (150 mg total) by mouth daily with breakfast.   No facility-administered encounter medications on file as of 12/29/2022.    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   Goiter, unspecified    Heart murmur    History of colonic polyps 2012   Multiple sclerosis (HCC)    Non-ST elevated myocardial infarction (non-STEMI) (HCC)    Olecranon bursitis of left elbow  10/17/2017   Oral mucositis 08/31/2019   SCCA (squamous cell carcinoma) of skin    right shin   SEMI (subendocardial myocardial infarction) (HCC) 09/29/2009   TOTAL RCA WITH STENT TO OM BRANCH   Shingles    Squamous cell carcinoma 2019   lower ext   Traumatic rupture of volar plate of left ring finger 03/25/2020   Unspecified essential hypertension    Unspecified hypothyroidism    Past Surgical History:  Procedure Laterality Date   CATARACT SURGERY  07/2009   CESAREAN SECTION     CORONARY ANGIOPLASTY WITH STENT PLACEMENT  2011   HAND SURGERY Left 09/2021   tendon surgery   KNEE SURGERY     leg surgery     squamous ccell removed    TENOSYNOVECTOMY Left 10/26/2021   Procedure: LEFT WRIST EXTENSOR TENOSYNOVECTOMY;  Surgeon: Betha Loa, MD;  Location: [facility];  Service: Orthopedics;  Laterality: Left;  45 MIN   TONSILLECTOMY     VESICOVAGINAL FISTULA CLOSURE W/ TAH     Family History  Problem Relation Age of Onset   Hypertension Mother    Heart failure Father    Diabetes Father    Aneurysm Brother        THORACIC/ABD ANEURYSM   Prostate cancer Brother 38   Diabetes Brother    Fibromyalgia Sister    Brain cancer Maternal Grandmother 55   Multiple sclerosis Neg Hx    Colon cancer Neg Hx    Esophageal cancer Neg Hx    Social History   Socioeconomic History   Marital status: Married    Spouse name: Not on file   Number of children: 2   Years of education: 14   Highest education level: Associate degree: academic program  Occupational History   Occupation: Physiological scientist  Tobacco Use   Smoking status: Former    Packs/day: 1.00    Years: 15.00    Additional pack years: 0.00    Total pack years: 15.00    Types: Cigarettes    Quit date: 12/16/2015    Years since quitting: 7.0   Smokeless tobacco: Never   Tobacco comments:    on and off smoker  Vaping Use   Vaping Use: Never used  Substance and Sexual Activity   Alcohol use: Yes     Alcohol/week: 0.0 standard drinks of alcohol    Comment: OCCASIONAL ALCOHOL USE.   Drug use: No   Sexual activity: Yes  Other Topics Concern   Not on file  Social History Narrative   The patient lives with husband. 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    Patient drinks occasionally, does not chew tobacco, does not use recreational drugs. She does drink occasional caffeine. She does  wear her seatbelt. She does wear bike helmet riding a bike. She does exercise 3 times a week. She does not wear hearing aids or dentures. There is a smoke alarm in her home. There are no firearms in her home. She feels safe in her relationship. She has never experienced physical abuse.   Patient sleeps 7-8 hours a night.   Patient drinks 2 cups of caffeine daily.   Patient is right handed.   Social Determinants of Health   Financial Resource Strain: Low Risk  (12/28/2022)   Overall Financial Resource Strain (CARDIA)    Difficulty of Paying Living Expenses: Not hard at all  Food Insecurity: No Food Insecurity (12/28/2022)   Hunger Vital Sign    Worried About Running Out of Food in the Last Year: Never true    Ran Out of Food in the Last Year: Never true  Transportation Needs: No Transportation Needs (12/28/2022)   PRAPARE - Administrator, Civil Service (Medical): No    Lack of Transportation (Non-Medical): No  Physical Activity: Insufficiently Active (12/28/2022)   Exercise Vital Sign    Days of Exercise per Week: 3 days    Minutes of Exercise per Session: 10 min  Stress: Stress Concern Present (12/28/2022)   [employer] of Occupational Health - Occupational Stress Questionnaire    Feeling of Stress : To some extent  Social Connections: Moderately Integrated (12/28/2022)   Social Connection and Isolation Panel [NHANES]    Frequency of Communication with Friends and Family: Twice a week    Frequency of Social Gatherings with Friends and Family: Three times a week     Attends Religious Services: Never    Active Member of Clubs or Organizations: Yes    Attends Banker Meetings: 1 to 4 times per year    Marital Status: Married    Tobacco Counseling Counseling given: Not Answered Tobacco comments: on and off smoker   Clinical Intake:  Pre-visit preparation completed: Yes  Pain : No/denies pain     BMI - recorded: 27.92 Nutritional Status: BMI 25 -29 Overweight Nutritional Risks: None Diabetes: Yes CBG done?: No Did pt. bring in CBG monitor from home?: No  How often do you need to have someone help you when you read instructions, pamphlets, or other written materials from your doctor or pharmacy?: (P) 1 - Never  Diabetic?Nutrition Risk Assessment:  Has the patient had any N/V/D within the last 2 months?  No  Does the patient have any non-healing wounds?  No  Has the patient had any unintentional weight loss or weight gain?  No   Diabetes:  Is the patient diabetic?  Yes  If diabetic, was a CBG obtained today?  No  Did the patient bring in their glucometer from home?  No  How often do you monitor your CBG's? N/a.   Financial Strains and Diabetes Management:  Are you having any financial strains with the device, your supplies or your medication? No .  Does the patient want to be seen by Chronic Care Management for management of their diabetes?  No  Would the patient like to be referred to a Nutritionist or for Diabetic Management?  No   Diabetic Exams:  Diabetic Eye Exam: Completed 11/03/22 Diabetic Foot Exam: Completed 11/26/22   Interpreter Needed?: No  Information entered by :: Lanier Ensign, LPN   Activities of Daily Living    12/28/2022    1:04 PM  In your present state of health, do you  have any difficulty performing the following activities:  Hearing? 0  Vision? 0  Difficulty concentrating or making decisions? 0  Walking or climbing stairs? 0  Dressing or bathing? 0  Doing errands, shopping? 0   Preparing Food and eating ? N  Using the Toilet? N  In the past six months, have you accidently leaked urine? N  Do you have problems with loss of bowel control? N  Managing your Medications? N  Managing your Finances? N  Housekeeping or managing your Housekeeping? N    Patient Care Team: Natalia Leatherwood, DO as PCP - General (Family Medicine) Wendall Stade, MD as PCP - Cardiology (Cardiology) Haver[place], Elvin So, MD as Referring Physician (Dermatology) York Spaniel, MD (Inactive) as Consulting Physician (Neurology) Mitchel Honour, DO as Consulting Physician (Obstetrics and Gynecology) Isla Pence, OD (Optometry)  Indicate any recent Medical Services you may have received from other than [facility] providers in the past year (date may be approximate).     Assessment:   This is a routine wellness examination for [facility].  Hearing/Vision screen Hearing Screening - Comments:: Pt denies any hearing issues  Vision Screening - Comments:: Pt follows up with Dr Clydene Pugh for annual eye exams  Dietary issues and exercise activities discussed: Current Exercise Habits: Home exercise routine, Type of exercise: walking, Time (Minutes): 10, Frequency (Times/Week): 3, Weekly Exercise (Minutes/Week): 30   Goals Addressed             This Visit's Progress    Patient Stated       None at this time        Depression Screen    12/29/2022    9:07 AM 12/03/2022    9:43 AM 11/26/2022    9:46 AM 08/13/2022    8:25 AM 04/13/2022    1:29 PM 12/02/2021   11:01 AM 09/28/2021   10:15 AM  PHQ 2/9 Scores  PHQ - 2 Score 0 0 0 0 0 0 0  PHQ- 9 Score     3  1    Fall Risk    12/28/2022    1:04 PM 12/03/2022    9:43 AM 11/26/2022    9:46 AM 08/13/2022    8:25 AM 08/10/2022    9:07 PM  Fall Risk   Falls in the past year? 1 1 0 1 1  Number falls in past yr: 0 0 0 1 1  Injury with Fall? 1 1 0 1 1  Comment hit head and got examined      Risk for fall due to : Impaired balance/gait;Impaired vision       Follow up Falls prevention discussed Falls evaluation completed Falls evaluation completed Falls evaluation completed     FALL RISK PREVENTION PERTAINING TO THE HOME:  Any stairs in or around the home? Yes  If so, are there any without handrails? No  Home free of loose throw rugs in walkways, pet beds, electrical cords, etc? Yes  Adequate lighting in your home to reduce risk of falls? Yes   ASSISTIVE DEVICES UTILIZED TO PREVENT FALLS:  Life alert? No  Use of a cane, walker or w/c? No  Grab bars in the bathroom? No  Shower chair or bench in shower? Yes  Elevated toilet seat or a handicapped toilet? No   TIMED UP AND GO:  Was the test performed? No .   Cognitive Function:    02/12/2019   10:03 AM 08/10/2018    9:03 AM 06/22/2017  9:33 AM 10/12/2016    2:53 PM 03/10/2016    9:08 AM  MMSE - Mini Mental State Exam  Not completed:  Refused     Orientation to time 4  4 5 5   Orientation to Place 5  5 5 5   Registration 3  3 3 3   Attention/ Calculation 5  5 5 5   Recall 3  2 2 2   Language- name 2 objects 2  2 2 2   Language- repeat 1  1 1 1   Language- follow 3 step command 3  3 3 3   Language- read & follow direction 1  1 1 1   Write a sentence 1  1 1 1   Copy design 1  1 1 1   Total score 29  28 29 29         12/29/2022    9:09 AM 12/02/2021   11:06 AM  6CIT Screen  What Year? 0 points 0 points  What month? 0 points 0 points  What time? 0 points 0 points  Count back from 20 0 points 0 points  Months in reverse 0 points 0 points  Repeat phrase 0 points 0 points  Total Score 0 points 0 points    Immunizations Immunization History  Administered Date(s) Administered   Fluad Quad(high Dose 65+) 07/04/2019, 06/05/2020, 04/02/2021, 04/13/2022   Hepatitis A, Adult 10/17/2017, 05/18/2018   Influenza,inj,Quad PF,6+ Mos 04/16/2015, 05/12/2016, 06/17/2017, 05/18/2018   PFIZER Comirnaty(Gray Top)Covid-19 Tri-Sucrose Vaccine 08/27/2020   PFIZER(Purple Top)SARS-COV-2 Vaccination  07/23/2020   PNEUMOCOCCAL CONJUGATE-20 03/03/2021   Pneumococcal Conjugate-13 11/15/2016   Pneumococcal Polysaccharide-23 08/02/2009, 10/14/2015   Tdap 12/26/2013   Zoster Recombinat (Shingrix) 10/22/2019, 02/07/2020    TDAP status: Up to date  Flu Vaccine status: Up to date  Pneumococcal vaccine status: Up to date  Covid-19 vaccine status: Completed vaccines  Qualifies for Shingles Vaccine? Yes   Zostavax completed Yes   Shingrix Completed?: Yes  Screening Tests Health Maintenance  Topic Date Due   INFLUENZA VACCINE  03/03/2023   Diabetic kidney evaluation - Urine ACR  04/29/2023   HEMOGLOBIN A1C  05/28/2023   OPHTHALMOLOGY EXAM  11/03/2023   FOOT EXAM  11/26/2023   Diabetic kidney evaluation - eGFR measurement  12/13/2023   DTaP/Tdap/Td (2 - Td or Tdap) 12/27/2023   Medicare Annual Wellness (AWV)  12/29/2023   MAMMOGRAM  10/19/2024   DEXA SCAN  02/17/2026   Colonoscopy  07/17/2031   Pneumonia Vaccine 10+ Years old  Completed   Hepatitis C Screening  Completed   Zoster Vaccines- Shingrix  Completed   HPV VACCINES  Aged Out   COVID-19 Vaccine  Discontinued    Health Maintenance  There are no preventive care reminders to display for this patient.   Colorectal cancer screening: Type of screening: Colonoscopy. Completed 07/16/21. Repeat every 10 years  Mammogram status: Completed 10/20/22. Repeat every year  Bone Density status: Completed 02/18/16. Results reflect: Bone density results: NORMAL. Repeat every 10 years.   Additional Screening:  Hepatitis C Screening:  Completed 10/14/15  Vision Screening: Recommended annual ophthalmology exams for early detection of glaucoma and other disorders of the eye. Is the patient up to date with their annual eye exam?  Yes  Who is the provider or what is the name of the office in which the patient attends annual eye exams? Dr Clydene Pugh If pt is not established with a provider, would they like to be referred to a provider to  establish care? No .   Dental Screening: Recommended  annual dental exams for proper oral hygiene  Community Resource Referral / Chronic Care Management: CRR required this visit?  No   CCM required this visit?  No      Plan:     I have personally reviewed and noted the following in the patient's chart:   Medical and social history Use of alcohol, tobacco or illicit drugs  Current medications and supplements including opioid prescriptions. Patient is not currently taking opioid prescriptions. Functional ability and status Nutritional status Physical activity Advanced directives List of other physicians Hospitalizations, surgeries, and ER visits in previous 12 months Vitals Screenings to include cognitive, depression, and falls Referrals and appointments  In addition, I have reviewed and discussed with patient certain preventive protocols, quality metrics, and best practice recommendations. A written personalized care plan for preventive services as well as general preventive health recommendations were provided to patient.     Marzella Schlein, LPN   [id number]/61/0960   Nurse Notes: none

## 2023-01-06 ENCOUNTER — Other Ambulatory Visit: Payer: Self-pay | Admitting: Family Medicine

## 2023-01-19 ENCOUNTER — Other Ambulatory Visit: Payer: Self-pay

## 2023-01-19 MED ORDER — AMLODIPINE BESYLATE 10 MG PO TABS: 10.0000 mg | ORAL_TABLET | Freq: Every day | ORAL | 0 refills | Status: DC

## 2023-01-27 ENCOUNTER — Other Ambulatory Visit: Payer: Self-pay | Admitting: Cardiovascular Disease

## 2023-02-08 ENCOUNTER — Ambulatory Visit: Payer: Medicare HMO | Admitting: Neurology

## 2023-02-08 VITALS — BP 116/66 | HR 62 | Ht 66.0 in | Wt 176.0 lb

## 2023-02-08 DIAGNOSIS — G35 Multiple sclerosis: Secondary | ICD-10-CM

## 2023-02-08 NOTE — Progress Notes (Signed)
Patient: Daisy Bennett Date of Birth: [birth year]  Reason for Visit: Follow up History from: Patient Primary Neurologist: Sater   ASSESSMENT AND PLAN 67 y.o. year old female   1.  Multiple sclerosis -Continues to do very well, has remained off MS medication since Sept 2022 (Aubagio) -Not on any medications from our office  -Recheck MRI brain in December 2024 (sent myself a reminder to contact her in November) -Follow up in 1 year or sooner if needed  HISTORY OF PRESENT ILLNESS: Had MRI of the brain December 2023 at [facility]. No active lesions (report below). Remains off MS medications (off Aubagio since Sept 2022). Has bladder incontinence, urgency, worsens overtime. A few cramps to feet. Takes magnesium. Otherwise doing very well.   IMPRESSION:  White matter lesions distribution reflecting history of demyelinating disease. No active enhancing lesion   #  Brain parenchyma: There are scattered and confluent high T2/FLAIR signal lesions within the periventricular and subcortical white matter including in the corpus collosum, distribution reflecting history of demyelinating disease. No infratentorial lesions. No active enhancing lesions.   Update 07/01/22 SS: She is doing well. Is off gabapentin, her memory is significantly improved. Rarely left leg bothers her. Feels good. Vision is okay. No weakness to arms or legs. 1 fall, she tripped in the summer, broke both her little fingers. B/B are fine. Feels good energy.  Has no concerns, is glad to be off MS medication.  HISTORY Dr. Epimenio Foot 12/30/2021: HISTORY OF PRESENT ILLNESS:  I had the pleasure of seeing Daisy Bennett at the [facility] at [facility] for a neurologic transfer care regarding her multiple sclerosis.  She had been a patient of Dr. Anne Hahn in the past.    She was diagnosed with MS in the 1990s after presenting with hearing loss followed by numbness from the waist down.  She is currently not on any disease  modifying therapy.   Currently, her gait  and balance are mildly reduced but she walks without support.     She had a trip/fall last week but this was first fall in a couple years.    She had done PT in past for her balance.   She had another fall in 2017 causing a falcine subdural hematoma.    She no longer has left leg numbness but sensation is mildly altered.    Vision is doing well.   Bladder function is ok though she has occasional urgency.        She notes some fatigue but this usually does not limit her ability to do what she sets out to do.  She usually sleeps well.  She denies depression or major difficulties with cognition.     MS History The patient was diagnosed with multiple sclerosis at [facility] in the 1990s after presenting with left hearing loss.   She had an MRI performed worrisome for MS.   She then had left numbness form the waist down and was officially diagnosed.   She was placed on Betaseron and continued until 2015.  She was seen by Dr. Hosie Poisson through our office in 2014 and Dr. Anne Hahn between 2015 and 2022.  She had been on Tecfidera but was noted to have low absolute lymphocyte counts and was switched over to Aubagio.  Due to diarrhea, she stopped in September 2022.    She is on metformin but this medication predates the onset of her diarrhea.  REVIEW OF SYSTEMS: Out of a complete 14 system review of  symptoms, the patient complains only of the following symptoms, and all other reviewed systems are negative.  See HPI  ALLERGIES: Allergies  Allergen Reactions   Aubagio [Teriflunomide] Diarrhea    Pancreatitis, neuropathy, elevated glucose   Betaseron [Interferon Beta-1b] Other (See Comments)    Increased LFT's   Provigil [Modafinil] Swelling    Tongue swelling and sores   Topamax [Topiramate]     Cognitive slowing   Doxycycline Other (See Comments)    Thrush,dizziness    HOME MEDICATIONS: Outpatient Medications Prior to Visit  Medication Sig Dispense Refill    amLODipine (NORVASC) 10 MG tablet Take 1 tablet (10 mg total) by mouth daily. 90 tablet 0   B Complex Vitamins (B COMPLEX-B12 PO) Take by mouth.     carvedilol (COREG) 12.5 MG tablet Take 1 tablet (12.5 mg total) by mouth 2 (two) times daily. 180 tablet 1   cholecalciferol (VITAMIN D) 1000 units tablet Take 2,000 Units by mouth daily.     empagliflozin (JARDIANCE) 25 MG TABS tablet Take 1 tablet (25 mg total) by mouth daily before breakfast. 90 tablet 1   fenofibrate 160 MG tablet Take 1 tablet (160 mg total) by mouth daily. 90 tablet 3   fish oil-omega-3 fatty acids 1000 MG capsule Take 1 g by mouth daily.     glimepiride (AMARYL) 4 MG tablet TAKE 1 TABLET BY MOUTH TWICE A DAY 180 tablet 1   levothyroxine (SYNTHROID) 112 MCG tablet Take 1 tablet (112 mcg total) by mouth daily. 90 tablet 1   losartan (COZAAR) 100 MG tablet TAKE 1 TABLET BY MOUTH DAILY. 90 tablet 3   metFORMIN (GLUCOPHAGE) 1000 MG tablet Take 1 tablet (1,000 mg total) by mouth daily with breakfast. 90 tablet 1   nitroGLYCERIN (NITROSTAT) 0.4 MG SL tablet PLACE 1 TABLET UNDER THE TONGUE EVERY 5 (FIVE) MINUTES X 3 DOSES AS NEEDED FOR CHEST PAIN. 25 tablet 4   pantoprazole (PROTONIX) 20 MG tablet Take 1 tablet (20 mg total) by mouth daily. 90 tablet 1   rosuvastatin (CRESTOR) 20 MG tablet TAKE 1 TABLET EVERY DAY 90 tablet 3   spironolactone (ALDACTONE) 25 MG tablet TAKE 1 TABLET EVERY DAY 90 tablet 3   venlafaxine XR (EFFEXOR XR) 150 MG 24 hr capsule Take 1 capsule (150 mg total) by mouth daily with breakfast. 90 capsule 1   aspirin EC 81 MG tablet Take 1 tablet (81 mg total) by mouth daily. Swallow whole. 90 tablet 3   No facility-administered medications prior to visit.    PAST MEDICAL HISTORY: 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   Goiter, unspecified    Heart  murmur    History of colonic polyps 2012   Multiple sclerosis (HCC)    Non-ST elevated myocardial infarction (non-STEMI) (HCC)    Olecranon bursitis of left elbow 10/17/2017   Oral mucositis 08/31/2019   SCCA (squamous cell carcinoma) of skin    right shin   SEMI (subendocardial myocardial infarction) (HCC) 09/29/2009   TOTAL RCA WITH STENT TO OM BRANCH   Shingles    Squamous cell carcinoma 2019   lower ext   Traumatic rupture of volar plate of left ring finger 03/25/2020   Unspecified essential hypertension    Unspecified hypothyroidism     PAST SURGICAL HISTORY: Past Surgical History:  Procedure Laterality Date   CATARACT SURGERY  07/2009   CESAREAN SECTION     CORONARY ANGIOPLASTY WITH STENT PLACEMENT  2011   HAND SURGERY Left 09/2021   tendon surgery   KNEE SURGERY     leg surgery     squamous ccell removed    TENOSYNOVECTOMY Left 10/26/2021   Procedure: LEFT WRIST EXTENSOR TENOSYNOVECTOMY;  Surgeon: Betha Loa, MD;  Location: [facility];  Service: Orthopedics;  Laterality: Left;  45 MIN   TONSILLECTOMY     VESICOVAGINAL FISTULA CLOSURE W/ TAH      FAMILY HISTORY: Family History  Problem Relation Age of Onset   Hypertension Mother    Heart failure Father    Diabetes Father    Aneurysm Brother        THORACIC/ABD ANEURYSM   Prostate cancer Brother 13   Diabetes Brother    Fibromyalgia Sister    Brain cancer Maternal Grandmother 72   Multiple sclerosis Neg Hx    Colon cancer Neg Hx    Esophageal cancer Neg Hx     SOCIAL HISTORY: Social History   Socioeconomic History   Marital status: Married    Spouse name: Not on file   Number of children: 2   Years of education: 14   Highest education level: Associate degree: academic program  Occupational History   Occupation: Physiological scientist  Tobacco Use   Smoking status: Former    Packs/day: 1.00    Years: 15.00    Additional pack years: 0.00    Total pack years: 15.00     Types: Cigarettes    Quit date: 12/16/2015    Years since quitting: 7.1   Smokeless tobacco: Never   Tobacco comments:    on and off smoker  Vaping Use   Vaping Use: Never used  Substance and Sexual Activity   Alcohol use: Yes    Alcohol/week: 0.0 standard drinks of alcohol    Comment: OCCASIONAL ALCOHOL USE.   Drug use: No   Sexual activity: Yes  Other Topics Concern   Not on file  Social History Narrative   The patient lives with husband. 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    Patient drinks occasionally, does not chew tobacco, does not use recreational drugs. She does drink occasional caffeine. She does wear her seatbelt. She does wear bike helmet riding a bike. She does exercise 3 times a week. She does not wear hearing aids or dentures. There is a smoke alarm in her home. There are no firearms in her home. She feels safe in her relationship. She has never experienced physical abuse.   Patient sleeps 7-8 hours a night.   Patient drinks 2 cups of caffeine daily.   Patient is right handed.   Social Determinants of Health   Financial Resource Strain: Low Risk  (12/28/2022)   Overall Financial Resource Strain (CARDIA)    Difficulty of Paying Living Expenses: Not hard at all  Food Insecurity: No Food Insecurity (12/28/2022)   Hunger Vital Sign    Worried About Running Out of Food in the Last Year: Never true    Ran Out of Food in the Last Year: Never true  Transportation Needs: No Transportation Needs (12/28/2022)   PRAPARE - Administrator, Civil Service (Medical): No    Lack of Transportation (Non-Medical): No  Physical Activity: Insufficiently Active (12/28/2022)   Exercise Vital Sign    Days of Exercise per Week:  3 days    Minutes of Exercise per Session: 10 min  Stress: Stress Concern Present (12/28/2022)   [employer] of Occupational Health - Occupational Stress Questionnaire    Feeling of Stress : To some extent  Social Connections:  Moderately Integrated (12/28/2022)   Social Connection and Isolation Panel [NHANES]    Frequency of Communication with Friends and Family: Twice a week    Frequency of Social Gatherings with Friends and Family: Three times a week    Attends Religious Services: Never    Active Member of Clubs or Organizations: Yes    Attends Banker Meetings: 1 to 4 times per year    Marital Status: Married  Catering manager Violence: Not At Risk (12/29/2022)   Humiliation, Afraid, Rape, and Kick questionnaire    Fear of Current or Ex-Partner: No    Emotionally Abused: No    Physically Abused: No    Sexually Abused: No    PHYSICAL EXAM  Vitals:   02/08/23 1140  BP: 116/66  Pulse: 62  SpO2: 99%  Weight: 176 lb (79.8 kg)  Height: 5\' 6"  (1.676 m)   Body mass index is 28.41 kg/m.  Generalized: Well developed, in no acute distress  Neurological examination  Mentation: Alert oriented to time, place, history taking. Follows all commands speech and language fluent Cranial nerve II-XII: Pupils were equal round reactive to light. Extraocular movements were full, visual field were full on confrontational test. Facial sensation and strength were normal.  Head turning and shoulder shrug  were normal and symmetric. Motor: The motor testing reveals 5 over 5 strength of all 4 extremities. Good symmetric motor tone is noted throughout.  Sensory: Sensory testing is intact to soft touch on all 4 extremities. No evidence of extinction is noted.  Coordination: Cerebellar testing reveals good finger-nose-finger and heel-to-shin bilaterally.  Gait and station: Gait is normal.  Reflexes: Deep tendon reflexes are symmetric and normal bilaterally.   DIAGNOSTIC DATA (LABS, IMAGING, TESTING) - I reviewed patient records, labs, notes, testing and imaging myself where available.  Lab Results  Component Value Date   WBC 9.0 05/20/2022   HGB 12.9 05/20/2022   HCT 37.8 05/20/2022   MCV 95.7 05/20/2022   PLT  161 05/20/2022      Component Value Date/Time   NA 138 12/13/2022 1007   NA 135 09/22/2020 1351   K 4.3 12/13/2022 1007   CL 102 12/13/2022 1007   CO2 27 12/13/2022 1007   GLUCOSE 194 (H) 12/13/2022 1007   BUN 30 (H) 12/13/2022 1007   BUN 17 09/22/2020 1351   CREATININE 1.37 (H) 12/13/2022 1007   CREATININE 1.31 (H) 05/20/2022 1339   CREATININE 1.27 (H) 04/28/2022 0816   CALCIUM 9.6 12/13/2022 1007   PROT 7.8 05/20/2022 1339   PROT 7.0 03/23/2021 1108   ALBUMIN 4.9 05/20/2022 1339   ALBUMIN 4.2 03/23/2021 1108   AST 22 05/20/2022 1339   ALT 17 05/20/2022 1339   ALKPHOS 76 05/20/2022 1339   BILITOT 0.5 05/20/2022 1339   GFRNONAA 45 (L) 05/20/2022 1339   GFRAA 77 09/22/2020 1351   Lab Results  Component Value Date   CHOL 168 09/28/2021   HDL 45.80 09/28/2021   LDLCALC 73 07/04/2019   LDLDIRECT 90.0 09/28/2021   TRIG 266.0 (H) 09/28/2021   CHOLHDL 4 09/28/2021   Lab Results  Component Value Date   HGBA1C 7.1 (A) 11/26/2022   HGBA1C 7.1 11/26/2022   HGBA1C 7.1 (A) 11/26/2022   HGBA1C 7.1 (  A) 11/26/2022   Lab Results  Component Value Date   VITAMINB12 682 07/22/2015   Lab Results  Component Value Date   TSH 1.79 11/26/2022    Margie Ege, AGNP-C, DNP 02/08/2023, 12:20 PM [facility] [address] [place], [place] [address] [phone]

## 2023-02-08 NOTE — Patient Instructions (Signed)
Anticoagulation Xarelto.  Will plan for another MRI of the brain in December 2024.  I will contact you in November.  Please reach out if you need me. Thanks!!!

## 2023-03-04 IMAGING — CT CT ABD-PELV W/ CM
2 of 5 series · 16 of 46 positions shown, 18 images · IV contrast (APPLIED)
Comparison: X-ray chest 04/17/2021; CT chest 10/02/2019.

CLINICAL DATA: Patient complains of chronic diarrhea with weight
loss for 6 months.

EXAM:
CT ABDOMEN AND PELVIS WITH CONTRAST
TECHNIQUE: Multidetector CT imaging of the abdomen and pelvis was performed
using the standard protocol following bolus administration of
intravenous contrast.
CONTRAST:  80mL OMNIPAQUE IOHEXOL 350 MG/ML SOLN

[Series 2: axial st · axial · 0.76mm/px · z∈[-534,-118]mm · 13 of 95 slices shown, 15 images]
[im 6/95  soft-tissue]
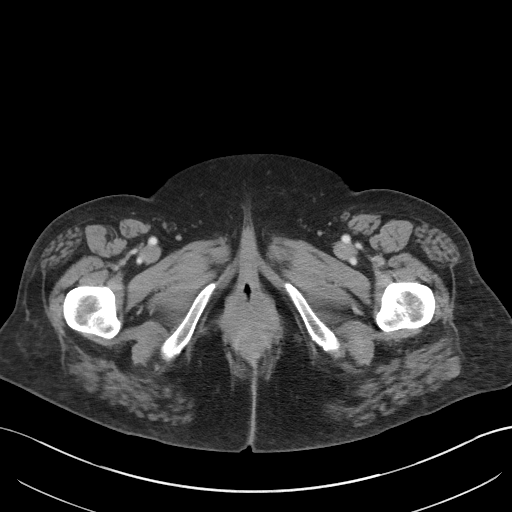
[im 6/95  bone]
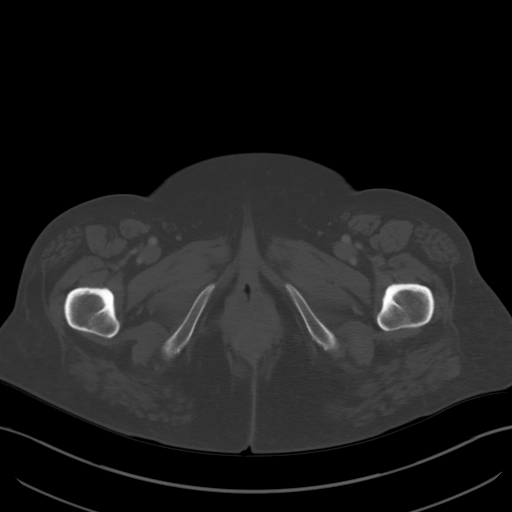
[im 12/95  soft-tissue]
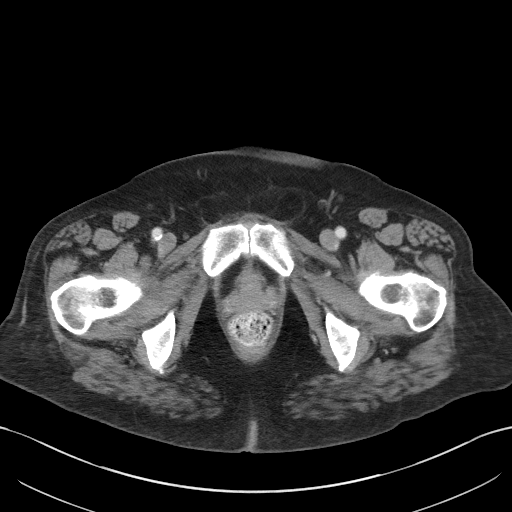
[im 23/95  soft-tissue]
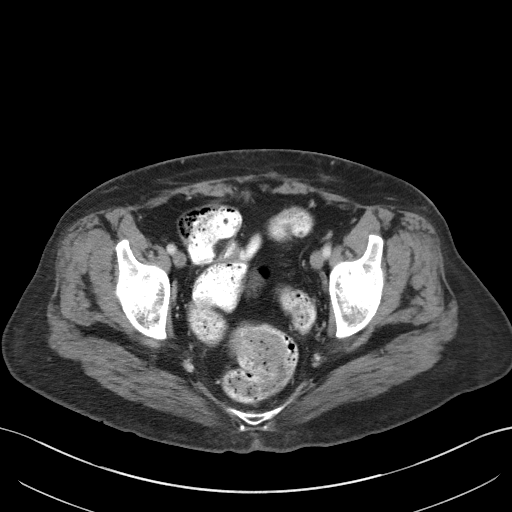
[im 28/95  soft-tissue]
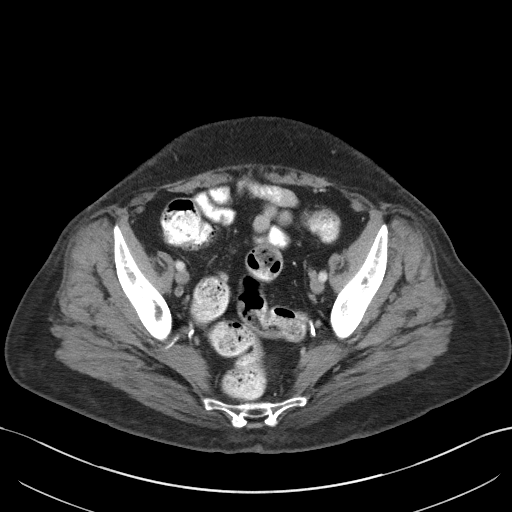
[im 34/95  soft-tissue]
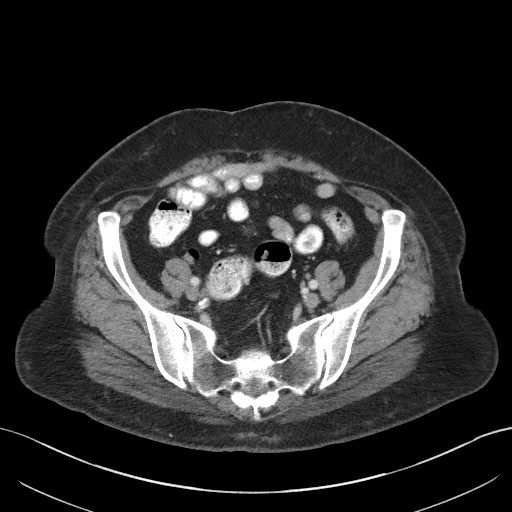
[im 39/95  soft-tissue]
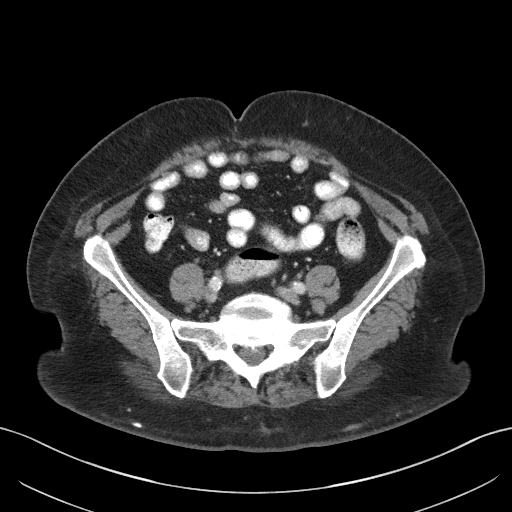
[im 50/95  soft-tissue]
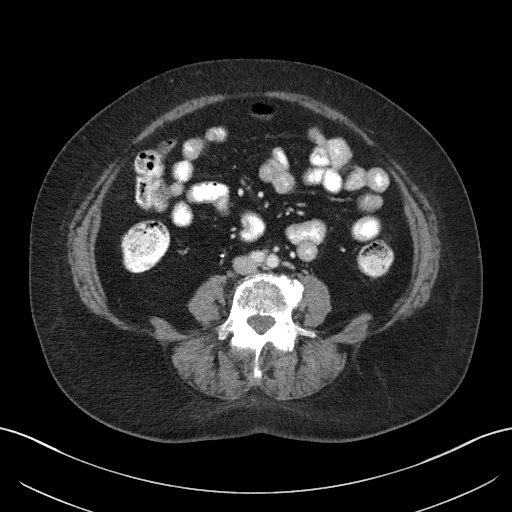
[im 56/95  soft-tissue]
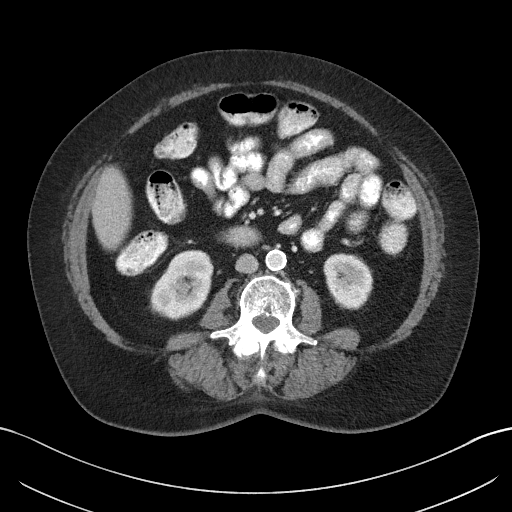
[im 61/95  soft-tissue]
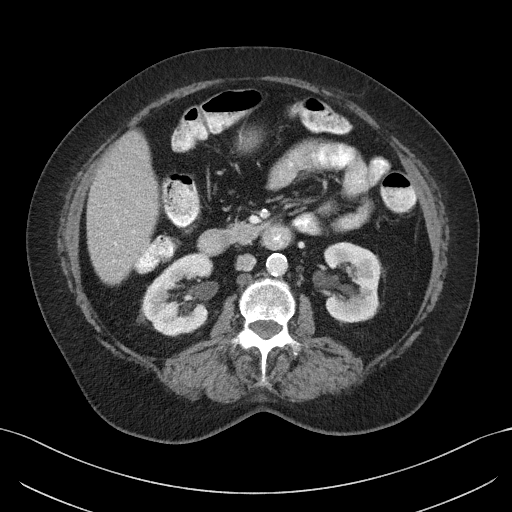
[im 61/95  bone]
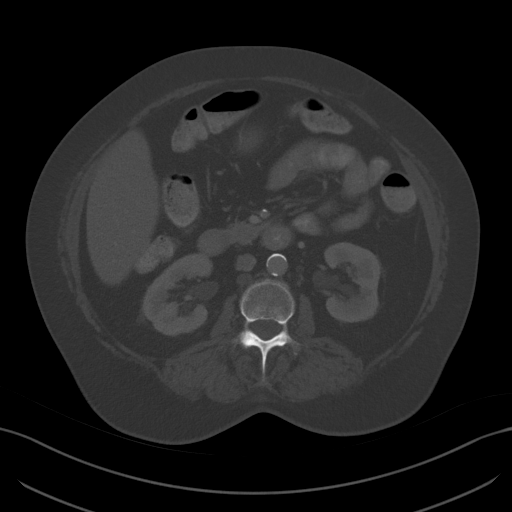
[im 67/95  soft-tissue]
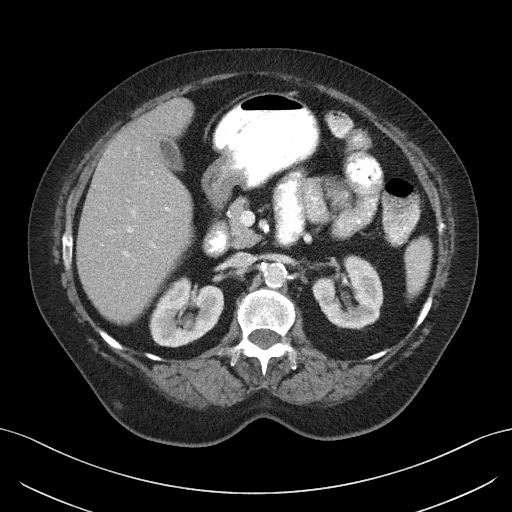
[im 72/95  soft-tissue]
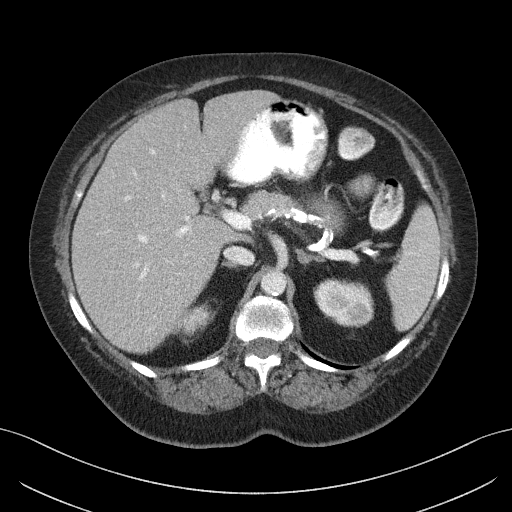
[im 83/95  soft-tissue]
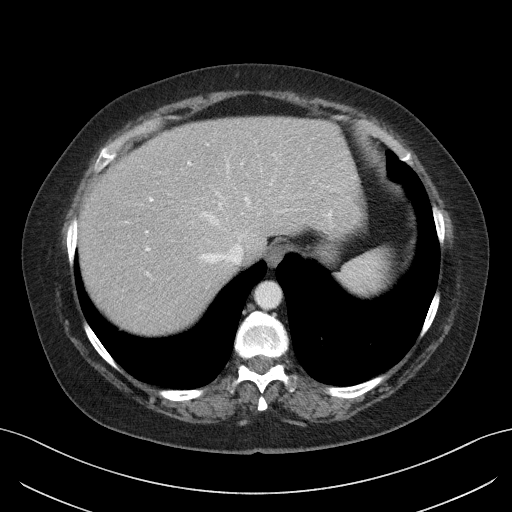
[im 89/95  soft-tissue]
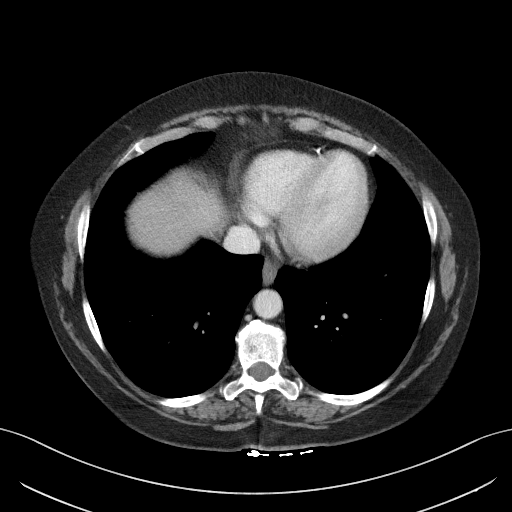

[Series 5: coronal st · coronal · 0.68mm/px · 3 of 101 slices shown]
[im 34/101  soft-tissue]
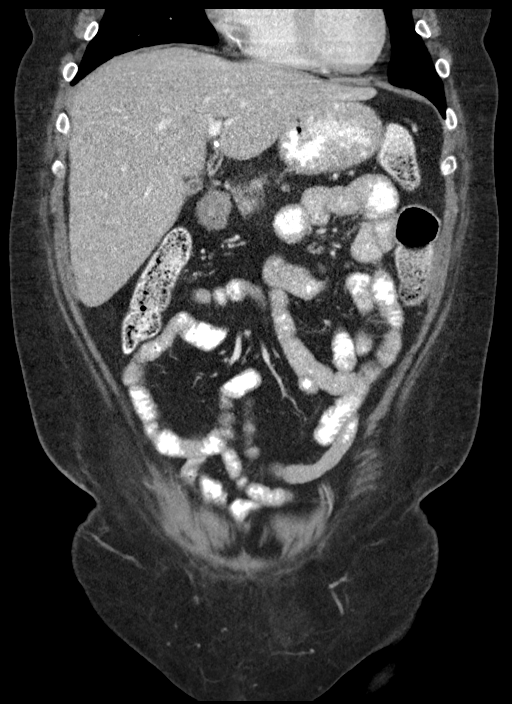
[im 45/101  soft-tissue]
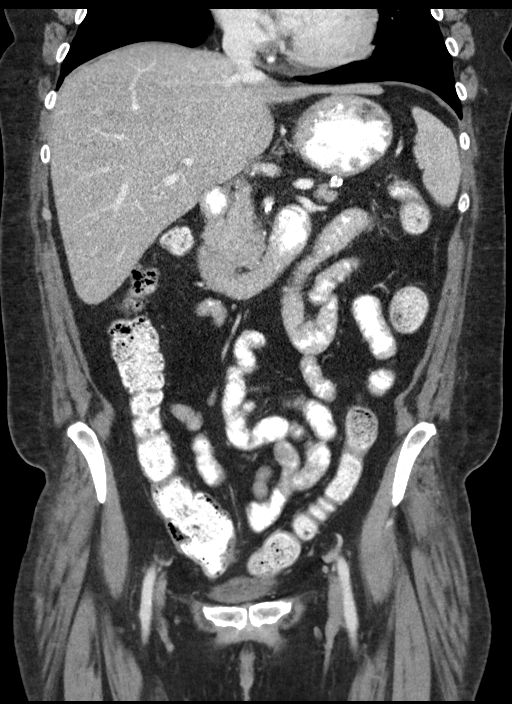
[im 56/101  soft-tissue]
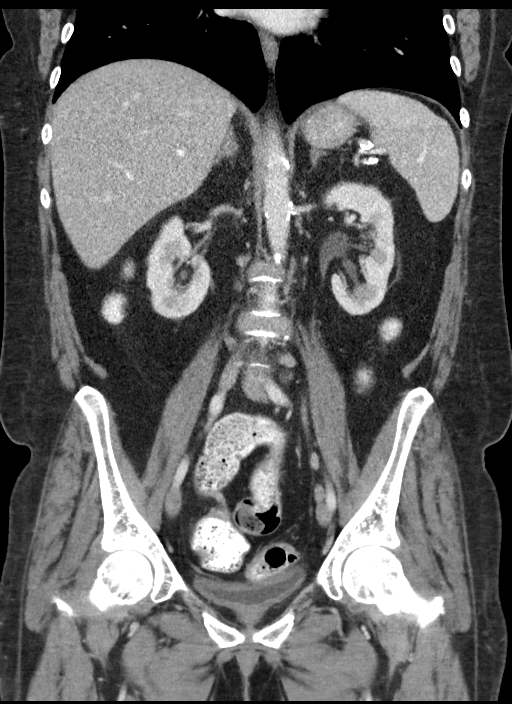

[16 of 46 positions shown; findings below may reference images not displayed]

FINDINGS: Lower chest: No acute abnormality.

Hepatobiliary: No focal liver abnormality is seen. No gallstones,
gallbladder wall thickening, or biliary dilatation.

Pancreas: Unremarkable. No pancreatic ductal dilatation or
surrounding inflammatory changes.

Spleen: Normal in size without focal abnormality.

Adrenals/Urinary Tract: Adrenal glands are unremarkable. Kidneys are
normal, without renal calculi, focal lesion, or hydronephrosis.
Bladder is unremarkable.

Stomach/Bowel: Stomach is within normal limits. Appendix appears
normal. No evidence of bowel wall thickening, distention, or
inflammatory changes.

Vascular/Lymphatic: Aortic atherosclerosis. No enlarged abdominal or
pelvic lymph nodes.

Reproductive: Status post hysterectomy. No adnexal masses.

Other: Small fat-containing inguinal hernias noted. No free fluid or
fluid collections.

Musculoskeletal: No acute or significant osseous findings.
IMPRESSION: 1. No acute findings within the abdomen or pelvis. No explanation
for patient's chronic diarrhea and weight loss.
2. Aortic Atherosclerosis ([id number].F).

## 2023-03-10 ENCOUNTER — Other Ambulatory Visit: Payer: Self-pay | Admitting: Family Medicine

## 2023-04-18 DIAGNOSIS — Z0100 Encounter for examination of eyes and vision without abnormal findings: Secondary | ICD-10-CM | POA: Diagnosis not present

## 2023-04-20 ENCOUNTER — Other Ambulatory Visit: Payer: Self-pay | Admitting: Cardiovascular Disease

## 2023-04-21 ENCOUNTER — Other Ambulatory Visit: Payer: Self-pay | Admitting: Family Medicine

## 2023-05-04 DIAGNOSIS — M1712 Unilateral primary osteoarthritis, left knee: Secondary | ICD-10-CM | POA: Diagnosis not present

## 2023-05-11 DIAGNOSIS — M1712 Unilateral primary osteoarthritis, left knee: Secondary | ICD-10-CM | POA: Diagnosis not present

## 2023-05-13 ENCOUNTER — Ambulatory Visit: Payer: Medicare HMO | Admitting: Family Medicine

## 2023-05-17 ENCOUNTER — Other Ambulatory Visit: Payer: Self-pay | Admitting: Cardiovascular Disease

## 2023-05-18 ENCOUNTER — Encounter: Payer: Self-pay | Admitting: Family Medicine

## 2023-05-18 ENCOUNTER — Ambulatory Visit: Payer: Medicare HMO | Admitting: Family Medicine

## 2023-05-18 VITALS — BP 122/72 | HR 59 | Temp 97.8°F | Wt 180.4 lb

## 2023-05-18 DIAGNOSIS — E034 Atrophy of thyroid (acquired): Secondary | ICD-10-CM

## 2023-05-18 DIAGNOSIS — I25118 Atherosclerotic heart disease of native coronary artery with other forms of angina pectoris: Secondary | ICD-10-CM

## 2023-05-18 DIAGNOSIS — E785 Hyperlipidemia, unspecified: Secondary | ICD-10-CM | POA: Diagnosis not present

## 2023-05-18 DIAGNOSIS — I10 Essential (primary) hypertension: Secondary | ICD-10-CM

## 2023-05-18 DIAGNOSIS — S065XAA Traumatic subdural hemorrhage with loss of consciousness status unknown, initial encounter: Secondary | ICD-10-CM

## 2023-05-18 DIAGNOSIS — Z7984 Long term (current) use of oral hypoglycemic drugs: Secondary | ICD-10-CM

## 2023-05-18 DIAGNOSIS — N1832 Chronic kidney disease, stage 3b: Secondary | ICD-10-CM | POA: Diagnosis not present

## 2023-05-18 DIAGNOSIS — E1169 Type 2 diabetes mellitus with other specified complication: Secondary | ICD-10-CM | POA: Diagnosis not present

## 2023-05-18 DIAGNOSIS — D6869 Other thrombophilia: Secondary | ICD-10-CM | POA: Diagnosis not present

## 2023-05-18 DIAGNOSIS — Z23 Encounter for immunization: Secondary | ICD-10-CM

## 2023-05-18 DIAGNOSIS — M1712 Unilateral primary osteoarthritis, left knee: Secondary | ICD-10-CM | POA: Diagnosis not present

## 2023-05-18 DIAGNOSIS — G35D Multiple sclerosis, unspecified: Secondary | ICD-10-CM

## 2023-05-18 DIAGNOSIS — F330 Major depressive disorder, recurrent, mild: Secondary | ICD-10-CM

## 2023-05-18 DIAGNOSIS — G35 Multiple sclerosis: Secondary | ICD-10-CM

## 2023-05-18 LAB — COMPREHENSIVE METABOLIC PANEL
ALT: 13 U/L (ref 0–35)
AST: 15 U/L (ref 0–37)
Albumin: 4.3 g/dL (ref 3.5–5.2)
Alkaline Phosphatase: 73 U/L (ref 39–117)
BUN: 27 mg/dL — ABNORMAL HIGH (ref 6–23)
CO2: 29 meq/L (ref 19–32)
Calcium: 10.4 mg/dL (ref 8.4–10.5)
Chloride: 100 meq/L (ref 96–112)
Creatinine, Ser: 1.35 mg/dL — ABNORMAL HIGH (ref 0.40–1.20)
GFR: 40.25 mL/min — ABNORMAL LOW (ref >60.00)
Glucose, Bld: 207 mg/dL — ABNORMAL HIGH (ref 70–99)
Potassium: 4.4 meq/L (ref 3.5–5.1)
Sodium: 137 meq/L (ref 135–145)
Total Bilirubin: 0.4 mg/dL (ref 0.2–1.2)
Total Protein: 7.1 g/dL (ref 6.0–8.3)

## 2023-05-18 LAB — POCT GLYCOSYLATED HEMOGLOBIN (HGB A1C)
HbA1c POC (<> result, manual entry): 8 % (ref 4.0–5.6)
HbA1c, POC (controlled diabetic range): 8 % — AB (ref 0.0–7.0)
HbA1c, POC (prediabetic range): 8 % — AB (ref 5.7–6.4)
Hemoglobin A1C: 8 % — AB (ref 4.0–5.6)

## 2023-05-18 LAB — MICROALBUMIN / CREATININE URINE RATIO
Creatinine,U: 48.3 mg/dL
Microalb Creat Ratio: 14.4 mg/g (ref 0.0–30.0)
Microalb, Ur: 6.9 mg/dL — ABNORMAL HIGH (ref 0.0–1.9)

## 2023-05-18 MED ORDER — SPIRONOLACTONE 25 MG PO TABS: 25.0000 mg | ORAL_TABLET | Freq: Every day | ORAL | 1 refills | Status: DC

## 2023-05-18 MED ORDER — EMPAGLIFLOZIN 25 MG PO TABS: 25.0000 mg | ORAL_TABLET | Freq: Every day | ORAL | 1 refills | Status: DC

## 2023-05-18 MED ORDER — ROSUVASTATIN CALCIUM 20 MG PO TABS: 20.0000 mg | ORAL_TABLET | Freq: Every day | ORAL | 3 refills | Status: DC

## 2023-05-18 MED ORDER — METFORMIN HCL 1000 MG PO TABS: 1000.0000 mg | ORAL_TABLET | Freq: Every day | ORAL | 1 refills | Status: DC

## 2023-05-18 MED ORDER — LEVOTHYROXINE SODIUM 112 MCG PO TABS: 112.0000 ug | ORAL_TABLET | Freq: Every day | ORAL | 3 refills | Status: DC

## 2023-05-18 MED ORDER — FENOFIBRATE 160 MG PO TABS: 160.0000 mg | ORAL_TABLET | Freq: Every day | ORAL | 3 refills | Status: DC

## 2023-05-18 MED ORDER — CARVEDILOL 12.5 MG PO TABS: 12.5000 mg | ORAL_TABLET | Freq: Two times a day (BID) | ORAL | 1 refills | Status: DC

## 2023-05-18 MED ORDER — PANTOPRAZOLE SODIUM 20 MG PO TBEC: 20.0000 mg | DELAYED_RELEASE_TABLET | Freq: Every day | ORAL | 1 refills | Status: DC

## 2023-05-18 MED ORDER — VENLAFAXINE HCL ER 150 MG PO CP24: 150.0000 mg | ORAL_CAPSULE | Freq: Every day | ORAL | 1 refills | Status: DC

## 2023-05-18 NOTE — Patient Instructions (Signed)

## 2023-05-18 NOTE — Progress Notes (Signed)
Daisy Bennett , 1955-12-16, 67 y.o., female MRN: [id number] Patient Care Team    Relationship Specialty Notifications Start End  Natalia Leatherwood, DO PCP - General Family Medicine  03/10/15   Wendall Stade, MD PCP - Cardiology Cardiology Admissions 05/31/18   Haverstock, Elvin So, MD Referring Physician Dermatology  02/10/16   York Spaniel, MD (Inactive) Consulting Physician Neurology  05/18/16   Mitchel Honour, DO Consulting Physician Obstetrics and Gynecology  06/04/16   Isla Pence, OD  Optometry  08/17/16     Chief Complaint  Patient presents with   Diabetes    cmc     Subjective: .Daisy Bennett is a 67 y.o. female present for [facility] follow up. Diabetes/morbid obesity: She reports compliance  with Amaryl  4 mg bid daily and metformin 1000 mg qd.  Jardiance 25 mg increased last visit and patient is tolerating well. Patient denies dizziness, hyperglycemic or hypoglycemic events. Patient denies numbness, tingling in the extremities or nonhealing wounds of feet.  She has gained weight since last being seen.  Hypertension/hyperlipidemia: She reports compliance with her medication regimen of spironolactone 25 mg daily, Crestor 20 mg daily, losartan 100 mg daily, fish oil 1000 mg daily, fenofibrate 160 mg daily Norvasc 10 mg, baby aspirin daily, Coreg 12.5 mg twice daily .  H/O CAD, NSTEMI, post stent 2011 (OM)-collateralized RCA.  She follows with Dr. Eden Emms.  Patient denies chest pain, shortness of breath, dizziness or lower extremity edema.    depression/MS:  Patient reports compliance with Effexor at 150 mg daily .       05/18/2023    9:52 AM 12/29/2022    9:07 AM 12/03/2022    9:43 AM 11/26/2022    9:46 AM 08/13/2022    8:25 AM  Depression screen PHQ 2/9  Decreased Interest 0 0 0 0 0  Down, Depressed, Hopeless 0 0 0 0 0  PHQ - 2 Score 0 0 0 0 0  Altered sleeping 2      Tired, decreased energy 0      Change in appetite 0      Feeling bad or failure about  yourself  0      Trouble concentrating 0      Moving slowly or fidgety/restless 0      Suicidal thoughts 0      PHQ-9 Score 2      Difficult doing work/chores Not difficult at all        Allergies  Allergen Reactions   Aubagio [Teriflunomide] Diarrhea    Pancreatitis, neuropathy, elevated glucose   Betaseron [Interferon Beta-1b] Other (See Comments)    Increased LFT's   Provigil [Modafinil] Swelling    Tongue swelling and sores   Topamax [Topiramate]     Cognitive slowing   Doxycycline Other (See Comments)    Thrush,dizziness   Social History   Social History Narrative   The patient lives with husband. 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    Patient drinks occasionally, does not chew tobacco, does not use recreational drugs. She does drink occasional caffeine. She does wear her seatbelt. She does wear bike helmet riding a bike. She does exercise 3 times a week. She does not wear hearing aids or dentures. There is a smoke alarm in her home. There are no firearms in her home. She feels safe in her relationship. She has never experienced physical abuse.   Patient sleeps 7-8 hours a  night.   Patient drinks 2 cups of caffeine daily.   Patient is right handed.   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   Goiter, unspecified    Heart murmur    History of colonic polyps 2012   Multiple sclerosis (HCC)    Non-ST elevated myocardial infarction (non-STEMI) (HCC)    Olecranon bursitis of left elbow 10/17/2017   Oral mucositis 08/31/2019   SCCA (squamous cell carcinoma) of skin    right shin   SEMI (subendocardial myocardial infarction) (HCC) 09/29/2009   TOTAL RCA WITH STENT TO OM BRANCH   Shingles    Squamous cell carcinoma 2019   lower ext   Traumatic rupture of volar plate of left ring finger  03/25/2020   Unspecified essential hypertension    Unspecified hypothyroidism    Past Surgical History:  Procedure Laterality Date   CATARACT SURGERY  07/2009   CESAREAN SECTION     CORONARY ANGIOPLASTY WITH STENT PLACEMENT  2011   HAND SURGERY Left 09/2021   tendon surgery   KNEE SURGERY     leg surgery     squamous ccell removed    TENOSYNOVECTOMY Left 10/26/2021   Procedure: LEFT WRIST EXTENSOR TENOSYNOVECTOMY;  Surgeon: Betha Loa, MD;  Location: [facility];  Service: Orthopedics;  Laterality: Left;  45 MIN   TONSILLECTOMY     VESICOVAGINAL FISTULA CLOSURE W/ TAH     Family History  Problem Relation Age of Onset   Hypertension Mother    Heart failure Father    Diabetes Father    Aneurysm Brother        THORACIC/ABD ANEURYSM   Prostate cancer Brother 66   Diabetes Brother    Fibromyalgia Sister    Brain cancer Maternal Grandmother 93   Multiple sclerosis Neg Hx    Colon cancer Neg Hx    Esophageal cancer Neg Hx    Allergies as of 05/18/2023       Reactions   Aubagio [teriflunomide] Diarrhea   Pancreatitis, neuropathy, elevated glucose   Betaseron [interferon Beta-1b] Other (See Comments)   Increased LFT's   Provigil [modafinil] Swelling   Tongue swelling and sores   Topamax [topiramate]    Cognitive slowing   Doxycycline Other (See Comments)   Thrush,dizziness        Medication List        Accurate as of May 18, 2023 11:14 AM. If you have any questions, ask your nurse or doctor.          amLODipine 10 MG tablet Commonly known as: NORVASC Take 1 tablet (10 mg total) by mouth daily. Please keep scheduled appointment for future refills. Thank you.   aspirin EC 81 MG tablet Take 1 tablet (81 mg total) by mouth daily. Swallow whole.   B COMPLEX-B12 PO Take by mouth.   carvedilol 12.5 MG tablet Commonly known as: COREG Take 1 tablet (12.5 mg total) by mouth 2 (two) times daily.   cholecalciferol 1000 units tablet Commonly  known as: VITAMIN D Take 2,000 Units by mouth daily.   empagliflozin 25 MG Tabs tablet Commonly known as: Jardiance Take 1 tablet (25 mg total) by mouth daily before breakfast.   fenofibrate 160 MG tablet Take 1 tablet (160 mg total) by mouth daily.   fish oil-omega-3 fatty  acids 1000 MG capsule Take 1 g by mouth daily.   glimepiride 4 MG tablet Commonly known as: AMARYL TAKE 1 TABLET BY MOUTH TWICE A DAY   levothyroxine 112 MCG tablet Commonly known as: SYNTHROID Take 1 tablet (112 mcg total) by mouth daily.   losartan 100 MG tablet Commonly known as: COZAAR TAKE 1 TABLET BY MOUTH DAILY.   metFORMIN 1000 MG tablet Commonly known as: GLUCOPHAGE Take 1 tablet (1,000 mg total) by mouth daily with breakfast.   nitroGLYCERIN 0.4 MG SL tablet Commonly known as: NITROSTAT PLACE 1 TABLET UNDER THE TONGUE EVERY 5 (FIVE) MINUTES X 3 DOSES AS NEEDED FOR CHEST PAIN.   pantoprazole 20 MG tablet Commonly known as: PROTONIX Take 1 tablet (20 mg total) by mouth daily.   rosuvastatin 20 MG tablet Commonly known as: CRESTOR Take 1 tablet (20 mg total) by mouth daily.   spironolactone 25 MG tablet Commonly known as: ALDACTONE Take 1 tablet (25 mg total) by mouth daily.   venlafaxine XR 150 MG 24 hr capsule Commonly known as: Effexor XR Take 1 capsule (150 mg total) by mouth daily with breakfast.        All past medical history, surgical history, allergies, family history, immunizations andmedications were updated in the EMR today and reviewed under the history and medication portions of their EMR.     ROS: Negative, with the exception of above mentioned in HPI   Objective:  BP 122/72   Pulse (!) 59   Temp 97.8 F (36.6 C)   Wt 180 lb 6.4 oz (81.8 kg)   SpO2 97%   BMI 29.12 kg/m  Body mass index is 29.12 kg/m. Physical Exam Vitals and nursing note reviewed.  Constitutional:      General: She is not in acute distress.    Appearance: Normal appearance. She is not  ill-appearing, toxic-appearing or diaphoretic.  HENT:     Head: Normocephalic and atraumatic.  Eyes:     General: No scleral icterus.       Right eye: No discharge.        Left eye: No discharge.     Extraocular Movements: Extraocular movements intact.     Conjunctiva/sclera: Conjunctivae normal.     Pupils: Pupils are equal, round, and reactive to light.  Cardiovascular:     Rate and Rhythm: Normal rate and regular rhythm.     Heart sounds: No murmur heard. Pulmonary:     Effort: Pulmonary effort is normal. No respiratory distress.     Breath sounds: Normal breath sounds. No wheezing, rhonchi or rales.  Musculoskeletal:     Right lower leg: No edema.     Left lower leg: No edema.  Skin:    General: Skin is warm.     Findings: No rash.  Neurological:     Mental Status: She is alert and oriented to person, place, and time. Mental status is at baseline.     Motor: No weakness.     Gait: Gait normal.  Psychiatric:        Mood and Affect: Mood normal.        Behavior: Behavior normal.        Thought Content: Thought content normal.        Judgment: Judgment normal.    Diabetic Foot Exam - Simple   No data filed     No results found. No results found. Results for orders placed or performed in visit on 05/18/23 (from the past 24 hour(s))  POCT glycosylated hemoglobin (Hb A1C)     Status: Abnormal   Collection Time: 05/18/23  9:33 AM  Result Value Ref Range   Hemoglobin A1C 8.0 (A) 4.0 - 5.6 %   HbA1c POC (<> result, manual entry) 8.0 4.0 - 5.6 %   HbA1c, POC (prediabetic range) 8.0 (A) 5.7 - 6.4 %   HbA1c, POC (controlled diabetic range) 8.0 (A) 0.0 - 7.0 %       Assessment/Plan: ADRINNA WASHINGTON is a 67 y.o. female present for OV for [facility] Diabetes mellitus with complication (HCC)/Morbid obesity Continue jardiance 25 mg qd Continue Amaryl to 4 mg BID Continue metformin 1000 mg daily (decreased secondary to kidney function) - increase exercise regimen and continue  dietary modification> cut back on sugary drinks. - tapered off gabapentin (w/ neuro assistance)  POCT HgB A1C--> 5.9> 6.1> 6.6> 8.6> 7.6 > 7.1>7.6> 7.1 > 8.0 today>>> discussed waiting on GFR result from today and deciding best management after.  - prevnar 20 completed  -  foot exam completed 11/26/2022 - Eye exam: UTD 11/2022-  Dr. Jacinto Reap, yearly eye exams. Requested. - Microalbumin:collected 05/18/2023 - flu shot -provided today  Hypertension/hyperlipidemia/Antiplatelet or antithrombotic long-term use/athersclerosis/hypokalemia/s/p stent placement:  stable - follows with cardiology, Dr. Eden Emms who prescribes medications.  - low salt, increase exercise.  Continue amlodipine 10 Continue losartan 100 Continue  coreg  12.5 mg BID  Continue spiro 25 mg daily (can stop kdur 10. She already stopped hctz) Continue fenofibrate Continue statin Continue baby aspirin.   - maintain routine cardiology follow ups.  - cmp  Depression Stable Continue Effexor 150 mg daily  Multiple sclerosis (HCC) Managed by neuro. -currently off all meds  CKD 3 Avoid NSAIDs when able Renally dose meds when appropriate Vitamin D levels collected today  hypothyroid Continue levo 112 mcg. refills will be provided in appropriate dose based on lab result today  Reviewed expectations re: course of current medical issues. Discussed self-management of symptoms. Outlined signs and symptoms indicating need for more acute intervention. Patient verbalized understanding and all questions were answered. Patient received an After-Visit Summary.    Orders Placed This Encounter  Procedures   Flu Vaccine Trivalent High Dose (Fluad)   Urine Microalbumin w/creat. ratio   Comp Met (CMET)   POCT glycosylated hemoglobin (Hb A1C)    Meds ordered this encounter  Medications   carvedilol (COREG) 12.5 MG tablet    Sig: Take 1 tablet (12.5 mg total) by mouth 2 (two) times daily.    Dispense:  180 tablet     Refill:  1   empagliflozin (JARDIANCE) 25 MG TABS tablet    Sig: Take 1 tablet (25 mg total) by mouth daily before breakfast.    Dispense:  90 tablet    Refill:  1   fenofibrate 160 MG tablet    Sig: Take 1 tablet (160 mg total) by mouth daily.    Dispense:  90 tablet    Refill:  3   levothyroxine (SYNTHROID) 112 MCG tablet    Sig: Take 1 tablet (112 mcg total) by mouth daily.    Dispense:  90 tablet    Refill:  3   metFORMIN (GLUCOPHAGE) 1000 MG tablet    Sig: Take 1 tablet (1,000 mg total) by mouth daily with breakfast.    Dispense:  90 tablet    Refill:  1   pantoprazole (PROTONIX) 20 MG tablet    Sig: Take 1 tablet (20 mg total) by mouth daily.  Dispense:  90 tablet    Refill:  1   rosuvastatin (CRESTOR) 20 MG tablet    Sig: Take 1 tablet (20 mg total) by mouth daily.    Dispense:  90 tablet    Refill:  3   spironolactone (ALDACTONE) 25 MG tablet    Sig: Take 1 tablet (25 mg total) by mouth daily.    Dispense:  90 tablet    Refill:  1   venlafaxine XR (EFFEXOR XR) 150 MG 24 hr capsule    Sig: Take 1 capsule (150 mg total) by mouth daily with breakfast.    Dispense:  90 capsule    Refill:  1     Referral Orders  No referral(s) requested today     Note is dictated utilizing voice recognition software. Although note has been proof read prior to signing, occasional typographical errors still can be missed. If any questions arise, please do not hesitate to call for verification.   electronically signed by:  Felix Pacini, DO  [facility] [place]

## 2023-05-19 ENCOUNTER — Telehealth: Payer: Self-pay | Admitting: Family Medicine

## 2023-05-19 MED ORDER — OZEMPIC (0.25 OR 0.5 MG/DOSE) 2 MG/3ML ~~LOC~~ SOPN
0.5000 mg | PEN_INJECTOR | SUBCUTANEOUS | 5 refills | Status: DC
Start: 2023-06-10 — End: 2023-08-19

## 2023-05-19 MED ORDER — SEMAGLUTIDE(0.25 OR 0.5MG/DOS) 2 MG/3ML ~~LOC~~ SOPN
0.2500 mg | PEN_INJECTOR | SUBCUTANEOUS | 0 refills | Status: DC
Start: 2023-05-19 — End: 2023-08-19

## 2023-05-19 NOTE — Telephone Encounter (Signed)
Please call patient Daisy Bennett, Daisy Bennett, Daisy Bennett, Daisy Bennett to once daily.   Follow-up with provider in 3 months for Daisy diabetes recheck-please make sure this is scheduled  Also once Daisy picks up Bennett, I recommend Daisy make a nurse visit so that Daisy can be instructed on proper injection technique for Daisy first dose.

## 2023-05-19 NOTE — Telephone Encounter (Signed)
Spoke with patient regarding results/recommendations.  

## 2023-05-22 ENCOUNTER — Other Ambulatory Visit: Payer: Self-pay | Admitting: Family Medicine

## 2023-05-24 ENCOUNTER — Ambulatory Visit: Payer: Medicare HMO | Attending: Physician Assistant | Admitting: Physician Assistant

## 2023-05-24 ENCOUNTER — Encounter: Payer: Self-pay | Admitting: Physician Assistant

## 2023-05-24 VITALS — BP 122/68 | HR 66 | Resp 16 | Ht 66.0 in | Wt 182.0 lb

## 2023-05-24 DIAGNOSIS — I2510 Atherosclerotic heart disease of native coronary artery without angina pectoris: Secondary | ICD-10-CM | POA: Diagnosis not present

## 2023-05-24 DIAGNOSIS — E1169 Type 2 diabetes mellitus with other specified complication: Secondary | ICD-10-CM

## 2023-05-24 DIAGNOSIS — E785 Hyperlipidemia, unspecified: Secondary | ICD-10-CM

## 2023-05-24 DIAGNOSIS — G35 Multiple sclerosis: Secondary | ICD-10-CM

## 2023-05-24 DIAGNOSIS — E782 Mixed hyperlipidemia: Secondary | ICD-10-CM | POA: Diagnosis not present

## 2023-05-24 DIAGNOSIS — F17200 Nicotine dependence, unspecified, uncomplicated: Secondary | ICD-10-CM | POA: Diagnosis not present

## 2023-05-24 DIAGNOSIS — S065XAD Traumatic subdural hemorrhage with loss of consciousness status unknown, subsequent encounter: Secondary | ICD-10-CM | POA: Diagnosis not present

## 2023-05-24 DIAGNOSIS — S065XAA Traumatic subdural hemorrhage with loss of consciousness status unknown, initial encounter: Secondary | ICD-10-CM

## 2023-05-24 DIAGNOSIS — I10 Essential (primary) hypertension: Secondary | ICD-10-CM | POA: Diagnosis not present

## 2023-05-24 NOTE — Patient Instructions (Signed)
Medication Instructions:  Your physician recommends that you continue on your current medications as directed. Please refer to the Current Medication list given to you today.  *If you need a refill on your cardiac medications before your next appointment, please call your pharmacy*  Lab Work: Have your primary care provider check a lipid panel when you see them If you have labs (blood work) drawn today and your tests are completely normal, you will receive your results only by: MyChart Message (if you have MyChart) OR A paper copy in the mail If you have any lab test that is abnormal or we need to change your treatment, we will call you to review the results.  Follow-Up: At [facility], you and your health needs are our priority.  As part of our continuing mission to provide you with exceptional heart care, we have created designated Provider Care Teams.  These Care Teams include your primary Cardiologist (physician) and Advanced Practice Providers (APPs -  Physician Assistants and Nurse Practitioners) who all work together to provide you with the care you need, when you need it.  Your next appointment:   1 year(s)  Provider:   Charlton Haws, MD     Low-Sodium Eating Plan Salt (sodium) helps you keep a healthy balance of fluids in your body. Too much sodium can raise your blood pressure. It can also cause fluid and waste to be held in your body. Your health care provider or dietitian may recommend a low-sodium eating plan if you have high blood pressure (hypertension), kidney disease, liver disease, or heart failure. Eating less sodium can help lower your blood pressure and reduce swelling. It can also protect your heart, liver, and kidneys. What are tips for following this plan? Reading food labels  Check food labels for the amount of sodium per serving. If you eat more than one serving, you must multiply the listed amount by the number of servings. Choose foods with less than  140 milligrams (mg) of sodium per serving. Avoid foods with 300 mg of sodium or more per serving. Always check how much sodium is in a product, even if the label says "unsalted" or "no salt added." Shopping  Buy products labeled as "low-sodium" or "no salt added." Buy fresh foods. Avoid canned foods and pre-made or frozen meals. Avoid canned, cured, or processed meats. Buy breads that have less than 80 mg of sodium per slice. Cooking  Eat more home-cooked food. Try to eat less restaurant, buffet, and fast food. Try not to add salt when you cook. Use salt-free seasonings or herbs instead of table salt or sea salt. Check with your provider or pharmacist before using salt substitutes. Cook with plant-based oils, such as canola, sunflower, or olive oil. Meal planning When eating at a restaurant, ask if your food can be made with less salt or no salt. Avoid dishes labeled as brined, pickled, cured, or smoked. Avoid dishes made with soy sauce, miso, or teriyaki sauce. Avoid foods that have monosodium glutamate (MSG) in them. MSG may be added to some restaurant food, sauces, soups, bouillon, and canned foods. Make meals that can be grilled, baked, poached, roasted, or steamed. These are often made with less sodium. General information Try to limit your sodium intake to 1,500-2,300 mg each day, or the amount told by your provider. What foods should I eat? Fruits Fresh, frozen, or canned fruit. Fruit juice. Vegetables Fresh or frozen vegetables. "No salt added" canned vegetables. "No salt added" tomato sauce and  paste. Low-sodium or reduced-sodium tomato and vegetable juice. Grains Low-sodium cereals, such as oats, puffed wheat and rice, and shredded wheat. Low-sodium crackers. Unsalted rice. Unsalted pasta. Low-sodium bread. Whole grain breads and whole grain pasta. Meats and other proteins Fresh or frozen meat, poultry, seafood, and fish. These should have no added salt. Low-sodium canned tuna  and salmon. Unsalted nuts. Dried peas, beans, and lentils without added salt. Unsalted canned beans. Eggs. Unsalted nut butters. Dairy Milk. Soy milk. Cheese that is naturally low in sodium, such as ricotta cheese, fresh mozzarella, or Swiss cheese. Low-sodium or reduced-sodium cheese. Cream cheese. Yogurt. Seasonings and condiments Fresh and dried herbs and spices. Salt-free seasonings. Low-sodium mustard and ketchup. Sodium-free salad dressing. Sodium-free light mayonnaise. Fresh or refrigerated horseradish. Lemon juice. Vinegar. Other foods Homemade, reduced-sodium, or low-sodium soups. Unsalted popcorn and pretzels. Low-salt or salt-free chips. The items listed above may not be all the foods and drinks you can have. Talk to a dietitian to learn more. What foods should I avoid? Vegetables Sauerkraut, pickled vegetables, and relishes. Olives. [place] fries. Onion rings. Regular canned vegetables, except low-sodium or reduced-sodium items. Regular canned tomato sauce and paste. Regular tomato and vegetable juice. Frozen vegetables in sauces. Grains Instant hot cereals. Bread stuffing, pancake, and biscuit mixes. Croutons. Seasoned rice or pasta mixes. Noodle soup cups. Boxed or frozen macaroni and cheese. Regular salted crackers. Self-rising flour. Meats and other proteins Meat or fish that is salted, canned, smoked, spiced, or pickled. Precooked or cured meat, such as sausages or meat loaves. Tomasa Blase. Ham. Pepperoni. Hot dogs. Corned beef. Chipped beef. Salt pork. Jerky. Pickled herring, anchovies, and sardines. Regular canned tuna. Salted nuts. Dairy Processed cheese and cheese spreads. Hard cheeses. Cheese curds. Blue cheese. Feta cheese. String cheese. Regular cottage cheese. Buttermilk. Canned milk. Fats and oils Salted butter. Regular margarine. Ghee. Bacon fat. Seasonings and condiments Onion salt, garlic salt, seasoned salt, table salt, and sea salt. Canned and packaged gravies.  Worcestershire sauce. Tartar sauce. Barbecue sauce. Teriyaki sauce. Soy sauce, including reduced-sodium soy sauce. Steak sauce. Fish sauce. Oyster sauce. Cocktail sauce. Horseradish that you find on the shelf. Regular ketchup and mustard. Meat flavorings and tenderizers. Bouillon cubes. Hot sauce. Pre-made or packaged marinades. Pre-made or packaged taco seasonings. Relishes. Regular salad dressings. Salsa. Other foods Salted popcorn and pretzels. Corn chips and puffs. Potato and tortilla chips. Canned or dried soups. Pizza. Frozen entrees and pot pies. The items listed above may not be all the foods and drinks you should avoid. Talk to a dietitian to learn more. This information is not intended to replace advice given to you by your health care provider. Make sure you discuss any questions you have with your health care provider. Document Revised: 08/05/2022 Document Reviewed: 08/05/2022 Elsevier Patient Education  2024 [employer] Heart-Healthy Eating Plan Many factors influence your heart health, including eating and exercise habits. Heart health is also called coronary health. Coronary risk increases with abnormal blood fat (lipid) levels. A heart-healthy eating plan includes limiting unhealthy fats, increasing healthy fats, limiting salt (sodium) intake, and making other diet and lifestyle changes. What is my plan? Your health care provider may recommend that: You limit your fat intake to _________% or less of your total calories each day. You limit your saturated fat intake to _________% or less of your total calories each day. You limit the amount of cholesterol in your diet to less than _________ mg per day. You limit the amount of sodium in your diet to less  than _________ mg per day. What are tips for following this plan? Cooking Cook foods using methods other than frying. Baking, boiling, grilling, and broiling are all good options. Other ways to reduce fat include: Removing the skin  from poultry. Removing all visible fats from meats. Steaming vegetables in water or broth. Meal planning  At meals, imagine dividing your plate into fourths: Fill one-half of your plate with vegetables and green salads. Fill one-fourth of your plate with whole grains. Fill one-fourth of your plate with lean protein foods. Eat 2-4 cups of vegetables per day. One cup of vegetables equals 1 cup (91 g) broccoli or cauliflower florets, 2 medium carrots, 1 large bell pepper, 1 large sweet potato, 1 large tomato, 1 medium white potato, 2 cups (150 g) raw leafy greens. Eat 1-2 cups of fruit per day. One cup of fruit equals 1 small apple, 1 large banana, 1 cup (237 g) mixed fruit, 1 large orange,  cup (82 g) dried fruit, 1 cup (240 mL) 100% fruit juice. Eat more foods that contain soluble fiber. Examples include apples, broccoli, carrots, beans, peas, and barley. Aim to get 25-30 g of fiber per day. Increase your consumption of legumes, nuts, and seeds to 4-5 servings per week. One serving of dried beans or legumes equals  cup (90 g) cooked, 1 serving of nuts is  oz (12 almonds, 24 pistachios, or 7 walnut halves), and 1 serving of seeds equals  oz (8 g). Fats Choose healthy fats more often. Choose monounsaturated and polyunsaturated fats, such as olive and canola oils, avocado oil, flaxseeds, walnuts, almonds, and seeds. Eat more omega-3 fats. Choose salmon, mackerel, sardines, tuna, flaxseed oil, and ground flaxseeds. Aim to eat fish at least 2 times each week. Check food labels carefully to identify foods with trans fats or high amounts of saturated fat. Limit saturated fats. These are found in animal products, such as meats, butter, and cream. Plant sources of saturated fats include palm oil, palm kernel oil, and coconut oil. Avoid foods with partially hydrogenated oils in them. These contain trans fats. Examples are stick margarine, some tub margarines, cookies, crackers, and other baked  goods. Avoid fried foods. General information Eat more home-cooked food and less restaurant, buffet, and fast food. Limit or avoid alcohol. Limit foods that are high in added sugar and simple starches such as foods made using white refined flour (white breads, pastries, sweets). Lose weight if you are overweight. Losing just 5-10% of your body weight can help your overall health and prevent diseases such as diabetes and heart disease. Monitor your sodium intake, especially if you have high blood pressure. Talk with your health care provider about your sodium intake. Try to incorporate more vegetarian meals weekly. What foods should I eat? Fruits All fresh, canned (in natural juice), or frozen fruits. Vegetables Fresh or frozen vegetables (raw, steamed, roasted, or grilled). Green salads. Grains Most grains. Choose whole wheat and whole grains most of the time. Rice and pasta, including brown rice and pastas made with whole wheat. Meats and other proteins Lean, well-trimmed beef, veal, pork, and lamb. Chicken and [place] without skin. All fish and shellfish. Wild duck, rabbit, pheasant, and venison. Egg whites or low-cholesterol egg substitutes. Dried beans, peas, lentils, and tofu. Seeds and most nuts. Dairy Low-fat or nonfat cheeses, including ricotta and mozzarella. Skim or 1% milk (liquid, powdered, or evaporated). Buttermilk made with low-fat milk. Nonfat or low-fat yogurt. Fats and oils Non-hydrogenated (trans-free) margarines. Vegetable oils, including soybean, sesame,  sunflower, olive, avocado, peanut, safflower, corn, canola, and cottonseed. Salad dressings or mayonnaise made with a vegetable oil. Beverages Water (mineral or sparkling). Coffee and tea. Unsweetened ice tea. Diet beverages. Sweets and desserts Sherbet, gelatin, and fruit ice. Small amounts of dark chocolate. Limit all sweets and desserts. Seasonings and condiments All seasonings and condiments. The items listed  above may not be a complete list of foods and beverages you can eat. Contact a dietitian for more options. What foods should I avoid? Fruits Canned fruit in heavy syrup. Fruit in cream or butter sauce. Fried fruit. Limit coconut. Vegetables Vegetables cooked in cheese, cream, or butter sauce. Fried vegetables. Grains Breads made with saturated or trans fats, oils, or whole milk. Croissants. Sweet rolls. Donuts. High-fat crackers, such as cheese crackers and chips. Meats and other proteins Fatty meats, such as hot dogs, ribs, sausage, bacon, rib-eye roast or steak. High-fat deli meats, such as salami and bologna. Caviar. Domestic duck and goose. Organ meats, such as liver. Dairy Cream, sour cream, cream cheese, and creamed cottage cheese. Whole-milk cheeses. Whole or 2% milk (liquid, evaporated, or condensed). Whole buttermilk. Cream sauce or high-fat cheese sauce. Whole-milk yogurt. Fats and oils Meat fat, or shortening. Cocoa butter, hydrogenated oils, palm oil, coconut oil, palm kernel oil. Solid fats and shortenings, including bacon fat, salt pork, lard, and butter. Nondairy cream substitutes. Salad dressings with cheese or sour cream. Beverages Regular sodas and any drinks with added sugar. Sweets and desserts Frosting. Pudding. Cookies. Cakes. Pies. Milk chocolate or white chocolate. Buttered syrups. Full-fat ice cream or ice cream drinks. The items listed above may not be a complete list of foods and beverages to avoid. Contact a dietitian for more information. Summary Heart-healthy meal planning includes limiting unhealthy fats, increasing healthy fats, limiting salt (sodium) intake and making other diet and lifestyle changes. Lose weight if you are overweight. Losing just 5-10% of your body weight can help your overall health and prevent diseases such as diabetes and heart disease. Focus on eating a balance of foods, including fruits and vegetables, low-fat or nonfat dairy, lean  protein, nuts and legumes, whole grains, and heart-healthy oils and fats. This information is not intended to replace advice given to you by your health care provider. Make sure you discuss any questions you have with your health care provider. Document Revised: 08/24/2021 Document Reviewed: 08/24/2021 Elsevier Patient Education  2024 [employer].

## 2023-05-24 NOTE — Progress Notes (Signed)
Cardiology Office Note:  .   Date:  05/24/2023  ID:  Daisy Bennett, DOB 03/14/1956, MRN [id number] PCP: Daisy Leatherwood, DO  [facility] Providers Cardiologist:  Daisy Haws, MD {  History of Present Illness: Daisy Bennett Kitchen   Daisy Bennett is a 67 y.o. female with a past medical history of CAD status post stenting in 2011 (had NSTEMI with subsequent stenting of the LM) she is here for follow-up appointment.  Had diffuse three-vessel disease with total right that was collateralized.  Somewhat atypical presentation with her diabetes mellitus.  Last Myoview done 06/16/2018 low risk nonischemic, EF 68%.  She has lots of dermatologic issues and multiple lesions removed from right and left thigh.  October 2017 fell and hit her head with subdural and had memory issues since then.  Aricept not helpful.  On Effexor, able to trip [place] and Provigil in the past.  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  Unfortunately with the stress started smoking a bit again.  More issues with anxiety on Effexor and Remeron started by PCP 10/28/2020.  Long-term amitriptyline was making her mouth dry.  Had wrist surgery with Daisy Bennett 10/26/2021.  Had elevated kappa light chains with no M spike and mild anemia and elevated creatinine as high as 1.85 being followed by primary.  4 grandkids 2 daughters living in [place].  Doing well off MS meds for now seeing new neurologist at [facility].  She was last seen 05/11/2022 was doing well from a cardiovascular standpoint.  Today, she tells me that she has no new cardiac issues.  Her kidney issues are little bit better, function has stabilized.  No chest pain or shortness of breath.  Trying to get some physical activity and started a new walking regimen this morning.  She has been tolerating her medications without any side effects.  Blood pressure well-controlled today.  Reports no shortness of breath nor dyspnea on exertion. Reports no chest pain,  pressure, or tightness. No edema, orthopnea, PND. Reports no palpitations.    ROS: Pertinent ROS in HPI  Studies Reviewed: Daisy Bennett Kitchen   EKG Interpretation Date/Time:  Tuesday May 24 2023 14:34:26 EDT Ventricular Rate:  60 PR Interval:  150 QRS Duration:  82 QT Interval:  424 QTC Calculation: 424 R Axis:   24  Text Interpretation: Normal sinus rhythm Normal ECG When compared with ECG of 27-Sep-2012 11:30, No significant change was found Confirmed by Daisy Bennett [phone]) on 05/24/2023 2:58:37 PM    No recent studies to review     Physical Exam:   VS:  BP 122/68 (BP Location: Left Arm, Patient Position: Sitting, Cuff Size: Normal)   Pulse 66   Resp 16   Ht 5\' 6"  (1.676 m)   Wt 182 lb (82.6 kg)   SpO2 99%   BMI 29.38 kg/m    Wt Readings from Last 3 Encounters:  05/24/23 182 lb (82.6 kg)  05/18/23 180 lb 6.4 oz (81.8 kg)  02/08/23 176 lb (79.8 kg)    GEN: Well nourished, well developed in no acute distress NECK: No JVD; No carotid bruits CARDIAC: RRR, no murmurs, rubs, gallops RESPIRATORY:  Clear to auscultation without rales, wheezing or rhonchi  ABDOMEN: Soft, non-tender, non-distended EXTREMITIES:  No edema; No deformity   ASSESSMENT AND PLAN: .   1.  CAD -no chest pains  -Recommended to continue current medication regimen including amlodipine 10 mg daily, aspirin 81 mg daily, carvedilol 12.5 mg twice daily, Jardiance 25  mg daily, fenofibrate 160 mg daily, losartan 100 mg daily, nitro as needed, omega-3 fatty acids 1000 mg daily, Crestor 20 mg daily, spironolactone 25 mg daily  2.  HTN -Blood pressure well-controlled today -Encouraged to continue current medication regimen -Continue heart healthy, low-sodium diet -Increase exercise as tolerated  3.  Diabetes mellitus -A1c was 8.0  -working on diet control -has not started on ozempic   4.  MS -stable, not addressed today  5.  Hyperlipidemia -Triglycerides 266, HDL 45, no LDL listed -She will need an updated lipid  panel next time she is here in the office -Continue diet modifications for her diabetes  6.  Smoking -no smoking, quit 4 years ago  7.  Subdural hematoma -no further issues -base to her baseline      Dispo: She can return in a year for follow-up with Dr. Eden Bennett  Signed, Daisy Dory, PA-C

## 2023-05-25 ENCOUNTER — Encounter: Payer: Self-pay | Admitting: Family Medicine

## 2023-06-04 ENCOUNTER — Other Ambulatory Visit: Payer: Self-pay | Admitting: Family Medicine

## 2023-06-07 ENCOUNTER — Telehealth: Payer: Self-pay | Admitting: Neurology

## 2023-06-07 NOTE — Telephone Encounter (Signed)
Please reach out to patient and make sure okay to Order MRI brain for surveillance off MS medication since Sept 2022, last MRI brain was in Dec 2023. If so I will place the orders.

## 2023-06-22 DIAGNOSIS — M7061 Trochanteric bursitis, right hip: Secondary | ICD-10-CM | POA: Diagnosis not present

## 2023-06-25 ENCOUNTER — Other Ambulatory Visit: Payer: Self-pay | Admitting: Cardiovascular Disease

## 2023-08-05 ENCOUNTER — Encounter: Payer: Self-pay | Admitting: Family Medicine

## 2023-08-19 ENCOUNTER — Encounter: Payer: Self-pay | Admitting: Family Medicine

## 2023-08-19 ENCOUNTER — Ambulatory Visit (INDEPENDENT_AMBULATORY_CARE_PROVIDER_SITE_OTHER): Payer: Medicare HMO | Admitting: Family Medicine

## 2023-08-19 VITALS — BP 138/68 | HR 64 | Temp 97.7°F | Wt 176.2 lb

## 2023-08-19 DIAGNOSIS — F330 Major depressive disorder, recurrent, mild: Secondary | ICD-10-CM

## 2023-08-19 DIAGNOSIS — Z7985 Long-term (current) use of injectable non-insulin antidiabetic drugs: Secondary | ICD-10-CM

## 2023-08-19 DIAGNOSIS — G479 Sleep disorder, unspecified: Secondary | ICD-10-CM | POA: Diagnosis not present

## 2023-08-19 DIAGNOSIS — I10 Essential (primary) hypertension: Secondary | ICD-10-CM

## 2023-08-19 DIAGNOSIS — E063 Autoimmune thyroiditis: Secondary | ICD-10-CM

## 2023-08-19 DIAGNOSIS — I25118 Atherosclerotic heart disease of native coronary artery with other forms of angina pectoris: Secondary | ICD-10-CM

## 2023-08-19 DIAGNOSIS — D6869 Other thrombophilia: Secondary | ICD-10-CM

## 2023-08-19 DIAGNOSIS — N1832 Chronic kidney disease, stage 3b: Secondary | ICD-10-CM | POA: Diagnosis not present

## 2023-08-19 DIAGNOSIS — Z7984 Long term (current) use of oral hypoglycemic drugs: Secondary | ICD-10-CM

## 2023-08-19 DIAGNOSIS — E785 Hyperlipidemia, unspecified: Secondary | ICD-10-CM

## 2023-08-19 DIAGNOSIS — E1169 Type 2 diabetes mellitus with other specified complication: Secondary | ICD-10-CM

## 2023-08-19 LAB — POCT GLYCOSYLATED HEMOGLOBIN (HGB A1C)
HbA1c POC (<> result, manual entry): 7.4 % (ref 4.0–5.6)
HbA1c, POC (controlled diabetic range): 7.4 % — AB (ref 0.0–7.0)
HbA1c, POC (prediabetic range): 7.4 % — AB (ref 5.7–6.4)
Hemoglobin A1C: 7.4 % — AB (ref 4.0–5.6)

## 2023-08-19 MED ORDER — METFORMIN HCL 1000 MG PO TABS: 1000.0000 mg | ORAL_TABLET | Freq: Every day | ORAL | 1 refills | Status: DC

## 2023-08-19 MED ORDER — VENLAFAXINE HCL ER 150 MG PO CP24: 150.0000 mg | ORAL_CAPSULE | Freq: Every day | ORAL | 1 refills | Status: DC

## 2023-08-19 MED ORDER — EMPAGLIFLOZIN 25 MG PO TABS: 25.0000 mg | ORAL_TABLET | Freq: Every day | ORAL | 1 refills | Status: DC

## 2023-08-19 MED ORDER — SPIRONOLACTONE 25 MG PO TABS: 25.0000 mg | ORAL_TABLET | Freq: Every day | ORAL | 1 refills | Status: DC

## 2023-08-19 MED ORDER — MIRTAZAPINE 15 MG PO TABS: ORAL_TABLET | ORAL | 1 refills | Status: DC

## 2023-08-19 MED ORDER — PANTOPRAZOLE SODIUM 20 MG PO TBEC: 20.0000 mg | DELAYED_RELEASE_TABLET | Freq: Every day | ORAL | 1 refills | Status: DC

## 2023-08-19 MED ORDER — SEMAGLUTIDE (1 MG/DOSE) 4 MG/3ML ~~LOC~~ SOPN: 1.0000 mg | PEN_INJECTOR | SUBCUTANEOUS | 1 refills | Status: DC

## 2023-08-19 MED ORDER — CARVEDILOL 12.5 MG PO TABS: 12.5000 mg | ORAL_TABLET | Freq: Two times a day (BID) | ORAL | 1 refills | Status: DC

## 2023-08-19 NOTE — Patient Instructions (Signed)

## 2023-08-19 NOTE — Progress Notes (Signed)
Daisy Bennett , 06-12-1956, 68 y.o., female MRN: [id number] Patient Care Team    Relationship Specialty Notifications Start End  Natalia Leatherwood, DO PCP - General Family Medicine  03/10/15   Wendall Stade, MD PCP - Cardiology Cardiology Admissions 05/31/18   Haverstock, Elvin So, MD Referring Physician Dermatology  02/10/16   York Spaniel, MD (Inactive) Consulting Physician Neurology  05/18/16   Mitchel Honour, DO Consulting Physician Obstetrics and Gynecology  06/04/16   Isla Pence, OD  Optometry  08/17/16     Chief Complaint  Patient presents with   Diabetes    States she take remeron and will need refill      Subjective: .Daisy Bennett is a 68 y.o. female present for Daisy Bennett follow up. Diabetes/morbid obesity: She reports compliance  with Amaryl  4 mg bid daily and metformin 1000 mg qd.  Jardiance 25 mg and tolerating ozempic  0.5 mg weekly Patient denies dizziness, hyperglycemic or hypoglycemic events. Patient denies numbness, tingling in the extremities or nonhealing wounds of feet.   Hypertension/hyperlipidemia: She reports compliance  with her medication regimen of spironolactone 25 mg daily, Crestor 20 mg daily, losartan 100 mg daily, fish oil 1000 mg daily, fenofibrate 160 mg daily Norvasc 10 mg, baby aspirin daily, Coreg 12.5 mg twice daily .  H/O CAD, NSTEMI, post stent 2011 (OM)-collateralized RCA.  She follows with Dr. Eden Emms.  Patient denies chest pain, shortness of breath, dizziness or lower extremity edema.     depression/MS/sleep disturbance:  Patient reports compliance  with Effexor at 150 mg daily and she restarted remeron 15 mg qhs .       08/19/2023   10:36 AM 05/18/2023    9:52 AM 12/29/2022    9:07 AM 12/03/2022    9:43 AM 11/26/2022    9:46 AM  Depression screen PHQ 2/9  Decreased Interest 0 0 0 0 0  Down, Depressed, Hopeless 0 0 0 0 0  PHQ - 2 Score 0 0 0 0 0  Altered sleeping  2     Tired, decreased energy  0     Change in appetite   0     Feeling bad or failure about yourself   0     Trouble concentrating  0     Moving slowly or fidgety/restless  0     Suicidal thoughts  0     PHQ-9 Score  2     Difficult doing work/chores  Not difficult at all       Allergies  Allergen Reactions   Aubagio [Teriflunomide] Diarrhea    Pancreatitis, neuropathy, elevated glucose   Betaseron [Interferon Beta-1b] Other (See Comments)    Increased LFT's   Provigil [Modafinil] Swelling    Tongue swelling and sores   Topamax [Topiramate]     Cognitive slowing   Doxycycline Other (See Comments)    Thrush,dizziness   Social History   Social History Narrative   The patient lives with husband. 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    Patient drinks occasionally, does not chew tobacco, does not use recreational drugs. She does drink occasional caffeine. She does wear her seatbelt. She does wear bike helmet riding a bike. She does exercise 3 times a week. She does not wear hearing aids or dentures. There is a smoke alarm in her home. There are no firearms in her home. She feels safe in her relationship. She has never experienced  physical abuse.   Patient sleeps 7-8 hours a night.   Patient drinks 2 cups of caffeine daily.   Patient is right handed.   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   Goiter, unspecified    Heart murmur    History of colonic polyps 2012   Multiple sclerosis (HCC)    Non-ST elevated myocardial infarction (non-STEMI) (HCC)    Olecranon bursitis of left elbow 10/17/2017   Oral mucositis 08/31/2019   SCCA (squamous cell carcinoma) of skin    right shin   SEMI (subendocardial myocardial infarction) (HCC) 09/29/2009   TOTAL RCA WITH STENT TO OM BRANCH   Shingles    Squamous cell carcinoma 2019   lower ext   Traumatic rupture of  volar plate of left ring finger 03/25/2020   Unspecified essential hypertension    Unspecified hypothyroidism    Past Surgical History:  Procedure Laterality Date   CATARACT SURGERY  07/2009   CESAREAN SECTION     CORONARY ANGIOPLASTY WITH STENT PLACEMENT  2011   HAND SURGERY Left 09/2021   tendon surgery   KNEE SURGERY     leg surgery     squamous ccell removed    TENOSYNOVECTOMY Left 10/26/2021   Procedure: LEFT WRIST EXTENSOR TENOSYNOVECTOMY;  Surgeon: Betha Loa, MD;  Location: [facility];  Service: Orthopedics;  Laterality: Left;  45 MIN   TONSILLECTOMY     VESICOVAGINAL FISTULA CLOSURE W/ TAH     Family History  Problem Relation Age of Onset   Hypertension Mother    Heart failure Father    Diabetes Father    Aneurysm Brother        THORACIC/ABD ANEURYSM   Prostate cancer Brother 100   Diabetes Brother    Fibromyalgia Sister    Brain cancer Maternal Grandmother 59   Multiple sclerosis Neg Hx    Colon cancer Neg Hx    Esophageal cancer Neg Hx    Allergies as of 08/19/2023       Reactions   Aubagio [teriflunomide] Diarrhea   Pancreatitis, neuropathy, elevated glucose   Betaseron [interferon Beta-1b] Other (See Comments)   Increased LFT's   Provigil [modafinil] Swelling   Tongue swelling and sores   Topamax [topiramate]    Cognitive slowing   Doxycycline Other (See Comments)   Thrush,dizziness        Medication List        Accurate as of August 19, 2023 10:44 AM. If you have any questions, ask your nurse or doctor.          STOP taking these medications    Ozempic (0.25 or 0.5 MG/DOSE) 2 MG/3ML Sopn Generic drug: Semaglutide(0.25 or 0.5MG /DOS)   Semaglutide(0.25 or 0.5MG /DOS) 2 MG/3ML Sopn       TAKE these medications    amLODipine 10 MG tablet Commonly known as: NORVASC Take 1 tablet (10 mg total) by mouth daily.   aspirin EC 81 MG tablet Take 1 tablet (81 mg total) by mouth daily. Swallow whole.   B COMPLEX-B12  PO Take by mouth.   carvedilol 12.5 MG tablet Commonly known as: COREG Take 1 tablet (12.5 mg total) by mouth 2 (two) times daily.   cholecalciferol 1000 units tablet Commonly known as: VITAMIN D Take 2,000 Units by mouth daily.  empagliflozin 25 MG Tabs tablet Commonly known as: Jardiance Take 1 tablet (25 mg total) by mouth daily before breakfast.   fenofibrate 160 MG tablet Take 1 tablet (160 mg total) by mouth daily.   fish oil-omega-3 fatty acids 1000 MG capsule Take 1 g by mouth daily.   glimepiride 4 MG tablet Commonly known as: AMARYL TAKE 1 TABLET BY MOUTH TWICE A DAY   levothyroxine 112 MCG tablet Commonly known as: SYNTHROID Take 1 tablet (112 mcg total) by mouth daily.   losartan 100 MG tablet Commonly known as: COZAAR TAKE 1 TABLET EVERY DAY   metFORMIN 1000 MG tablet Commonly known as: GLUCOPHAGE Take 1 tablet (1,000 mg total) by mouth daily with breakfast.   nitroGLYCERIN 0.4 MG SL tablet Commonly known as: NITROSTAT PLACE 1 TABLET UNDER THE TONGUE EVERY 5 (FIVE) MINUTES X 3 DOSES AS NEEDED FOR CHEST PAIN.   pantoprazole 20 MG tablet Commonly known as: PROTONIX Take 1 tablet (20 mg total) by mouth daily.   rosuvastatin 20 MG tablet Commonly known as: CRESTOR Take 1 tablet (20 mg total) by mouth daily.   spironolactone 25 MG tablet Commonly known as: ALDACTONE Take 1 tablet (25 mg total) by mouth daily.   venlafaxine XR 150 MG 24 hr capsule Commonly known as: Effexor XR Take 1 capsule (150 mg total) by mouth daily with breakfast.        All past medical history, surgical history, allergies, family history, immunizations andmedications were updated in the EMR today and reviewed under the history and medication portions of their EMR.     ROS: Negative, with the exception of above mentioned in HPI   Objective:  BP 138/68   Pulse 64   Temp 97.7 F (36.5 C)   Wt 176 lb 3.2 oz (79.9 kg)   SpO2 100%   BMI 28.44 kg/m  Body mass index  is 28.44 kg/m. Physical Exam Vitals and nursing note reviewed.  Constitutional:      General: She is not in acute distress.    Appearance: Normal appearance. She is not ill-appearing, toxic-appearing or diaphoretic.  HENT:     Head: Normocephalic and atraumatic.  Eyes:     General: No scleral icterus.       Right eye: No discharge.        Left eye: No discharge.     Extraocular Movements: Extraocular movements intact.     Conjunctiva/sclera: Conjunctivae normal.     Pupils: Pupils are equal, round, and reactive to light.  Cardiovascular:     Rate and Rhythm: Normal rate and regular rhythm.     Heart sounds: No murmur heard. Pulmonary:     Effort: Pulmonary effort is normal. No respiratory distress.     Breath sounds: Normal breath sounds. No wheezing, rhonchi or rales.  Musculoskeletal:     Right lower leg: No edema.     Left lower leg: No edema.  Skin:    General: Skin is warm.     Findings: No rash.  Neurological:     Mental Status: She is alert and oriented to person, place, and time. Mental status is at baseline.     Motor: No weakness.     Gait: Gait normal.  Psychiatric:        Mood and Affect: Mood normal.        Behavior: Behavior normal.        Thought Content: Thought content normal.        Judgment: Judgment normal.  Diabetic Foot Exam - Simple   Simple Foot Form Diabetic Foot exam was performed with the following findings: Yes 08/19/2023 10:44 AM  Visual Inspection No deformities, no ulcerations, no other skin breakdown bilaterally: Yes Sensation Testing Intact to touch and monofilament testing bilaterally: Yes Pulse Check Posterior Tibialis and Dorsalis pulse intact bilaterally: Yes Comments     No results found. No results found. Results for orders placed or performed in visit on 08/19/23 (from the past 24 hours)  POCT HgB A1C     Status: Abnormal   Collection Time: 08/19/23 10:38 AM  Result Value Ref Range   Hemoglobin A1C 7.4 (A) 4.0 - 5.6 %    HbA1c POC (<> result, manual entry) 7.4 4.0 - 5.6 %   HbA1c, POC (prediabetic range) 7.4 (A) 5.7 - 6.4 %   HbA1c, POC (controlled diabetic range) 7.4 (A) 0.0 - 7.0 %    Assessment/Plan: Daisy Bennett is a 68 y.o. female present for OV for [facility] Diabetes mellitus with complication (HCC)/Morbid obesity Improving - Continue jardiance 25 mg qd Continue Amaryl to 4 mg BID Continue metformin 1000 mg daily (decreased secondary to kidney function) Increase ozempic to 1 mg weekly injection.  - increase exercise regimen and continue dietary modification> cut back on sugary drinks. - tapered off gabapentin (w/ neuro assistance)  POCT HgB A1C--> 5.9> 6.1> 6.6> 8.6> 7.6 > 7.1>7.6> 7.1 > 8.0>7.4 today>>>  - prevnar 20 completed  -  foot exam completed 08/19/2023 - Eye exam: UTD 11/2022-  Dr. Jacinto Reap, yearly eye exams. Requested. - Microalbumin:collected 05/18/2023 - flu shot -pUTD 2024  Hypertension/hyperlipidemia/Antiplatelet or antithrombotic long-term use/athersclerosis/hypokalemia/s/p stent placement:  stable - follows with cardiology, Dr. Eden Emms who prescribes medications.  - low salt, increase exercise.  Continue  amlodipine 10 Continue losartan 100 Continue  coreg  12.5 mg BID  Continue  spiro 25 mg daily (can stop kdur 10. She already stopped hctz) Continue fenofibrate Continue statin Continue baby aspirin.   - maintain routine cardiology follow ups.   Depression/sleep disturbance Stable Continue Effexor 150 mg daily Continue remeron 15 mg every day  Multiple sclerosis (HCC) Managed by neuro. -currently off all meds  CKD 3 Avoid NSAIDs when able Renally dose meds when appropriate Vitamin D levels UTD  hypothyroid Continue levo 112 mcg.  Labs due next visit.   Reviewed expectations re: course of current medical issues. Discussed self-management of symptoms. Outlined signs and symptoms indicating need for more acute intervention. Patient verbalized understanding  and all questions were answered. Patient received an After-Visit Summary.    Orders Placed This Encounter  Procedures   POCT HgB A1C    No orders of the defined types were placed in this encounter.    Referral Orders  No referral(s) requested today     Note is dictated utilizing voice recognition software. Although note has been proof read prior to signing, occasional typographical errors still can be missed. If any questions arise, please do not hesitate to call for verification.   electronically signed by:  Felix Pacini, DO  [facility] [place]

## 2023-09-07 ENCOUNTER — Other Ambulatory Visit: Payer: Self-pay | Admitting: Cardiovascular Disease

## 2023-10-10 DIAGNOSIS — H04123 Dry eye syndrome of bilateral lacrimal glands: Secondary | ICD-10-CM | POA: Diagnosis not present

## 2023-10-10 DIAGNOSIS — E119 Type 2 diabetes mellitus without complications: Secondary | ICD-10-CM | POA: Diagnosis not present

## 2023-10-10 DIAGNOSIS — H43811 Vitreous degeneration, right eye: Secondary | ICD-10-CM | POA: Diagnosis not present

## 2023-10-10 DIAGNOSIS — H40013 Open angle with borderline findings, low risk, bilateral: Secondary | ICD-10-CM | POA: Diagnosis not present

## 2023-10-10 LAB — HM DIABETES EYE EXAM

## 2023-11-03 DIAGNOSIS — Z01419 Encounter for gynecological examination (general) (routine) without abnormal findings: Secondary | ICD-10-CM | POA: Diagnosis not present

## 2023-11-03 DIAGNOSIS — Z1231 Encounter for screening mammogram for malignant neoplasm of breast: Secondary | ICD-10-CM | POA: Diagnosis not present

## 2023-11-03 DIAGNOSIS — Z6827 Body mass index (BMI) 27.0-27.9, adult: Secondary | ICD-10-CM | POA: Diagnosis not present

## 2023-11-03 LAB — HM MAMMOGRAPHY

## 2023-11-16 DIAGNOSIS — M4728 Other spondylosis with radiculopathy, sacral and sacrococcygeal region: Secondary | ICD-10-CM | POA: Diagnosis not present

## 2023-11-16 DIAGNOSIS — M9904 Segmental and somatic dysfunction of sacral region: Secondary | ICD-10-CM | POA: Diagnosis not present

## 2023-11-16 DIAGNOSIS — M9903 Segmental and somatic dysfunction of lumbar region: Secondary | ICD-10-CM | POA: Diagnosis not present

## 2023-11-16 DIAGNOSIS — M4726 Other spondylosis with radiculopathy, lumbar region: Secondary | ICD-10-CM | POA: Diagnosis not present

## 2023-11-17 DIAGNOSIS — M4726 Other spondylosis with radiculopathy, lumbar region: Secondary | ICD-10-CM | POA: Diagnosis not present

## 2023-11-17 DIAGNOSIS — M4728 Other spondylosis with radiculopathy, sacral and sacrococcygeal region: Secondary | ICD-10-CM | POA: Diagnosis not present

## 2023-11-17 DIAGNOSIS — M9903 Segmental and somatic dysfunction of lumbar region: Secondary | ICD-10-CM | POA: Diagnosis not present

## 2023-11-17 DIAGNOSIS — M9904 Segmental and somatic dysfunction of sacral region: Secondary | ICD-10-CM | POA: Diagnosis not present

## 2023-11-21 DIAGNOSIS — M4728 Other spondylosis with radiculopathy, sacral and sacrococcygeal region: Secondary | ICD-10-CM | POA: Diagnosis not present

## 2023-11-21 DIAGNOSIS — M9904 Segmental and somatic dysfunction of sacral region: Secondary | ICD-10-CM | POA: Diagnosis not present

## 2023-11-21 DIAGNOSIS — M4726 Other spondylosis with radiculopathy, lumbar region: Secondary | ICD-10-CM | POA: Diagnosis not present

## 2023-11-21 DIAGNOSIS — M9903 Segmental and somatic dysfunction of lumbar region: Secondary | ICD-10-CM | POA: Diagnosis not present

## 2023-11-22 DIAGNOSIS — M9904 Segmental and somatic dysfunction of sacral region: Secondary | ICD-10-CM | POA: Diagnosis not present

## 2023-11-22 DIAGNOSIS — M4726 Other spondylosis with radiculopathy, lumbar region: Secondary | ICD-10-CM | POA: Diagnosis not present

## 2023-11-22 DIAGNOSIS — M4728 Other spondylosis with radiculopathy, sacral and sacrococcygeal region: Secondary | ICD-10-CM | POA: Diagnosis not present

## 2023-11-22 DIAGNOSIS — M9903 Segmental and somatic dysfunction of lumbar region: Secondary | ICD-10-CM | POA: Diagnosis not present

## 2023-11-23 DIAGNOSIS — M4728 Other spondylosis with radiculopathy, sacral and sacrococcygeal region: Secondary | ICD-10-CM | POA: Diagnosis not present

## 2023-11-23 DIAGNOSIS — D485 Neoplasm of uncertain behavior of skin: Secondary | ICD-10-CM | POA: Diagnosis not present

## 2023-11-23 DIAGNOSIS — Z85828 Personal history of other malignant neoplasm of skin: Secondary | ICD-10-CM | POA: Diagnosis not present

## 2023-11-23 DIAGNOSIS — L578 Other skin changes due to chronic exposure to nonionizing radiation: Secondary | ICD-10-CM | POA: Diagnosis not present

## 2023-11-23 DIAGNOSIS — D225 Melanocytic nevi of trunk: Secondary | ICD-10-CM | POA: Diagnosis not present

## 2023-11-23 DIAGNOSIS — L814 Other melanin hyperpigmentation: Secondary | ICD-10-CM | POA: Diagnosis not present

## 2023-11-23 DIAGNOSIS — L570 Actinic keratosis: Secondary | ICD-10-CM | POA: Diagnosis not present

## 2023-11-23 DIAGNOSIS — M9903 Segmental and somatic dysfunction of lumbar region: Secondary | ICD-10-CM | POA: Diagnosis not present

## 2023-11-23 DIAGNOSIS — M4726 Other spondylosis with radiculopathy, lumbar region: Secondary | ICD-10-CM | POA: Diagnosis not present

## 2023-11-23 DIAGNOSIS — L821 Other seborrheic keratosis: Secondary | ICD-10-CM | POA: Diagnosis not present

## 2023-11-23 DIAGNOSIS — M9904 Segmental and somatic dysfunction of sacral region: Secondary | ICD-10-CM | POA: Diagnosis not present

## 2023-11-28 DIAGNOSIS — M9904 Segmental and somatic dysfunction of sacral region: Secondary | ICD-10-CM | POA: Diagnosis not present

## 2023-11-28 DIAGNOSIS — M4728 Other spondylosis with radiculopathy, sacral and sacrococcygeal region: Secondary | ICD-10-CM | POA: Diagnosis not present

## 2023-11-28 DIAGNOSIS — M9903 Segmental and somatic dysfunction of lumbar region: Secondary | ICD-10-CM | POA: Diagnosis not present

## 2023-11-28 DIAGNOSIS — M4726 Other spondylosis with radiculopathy, lumbar region: Secondary | ICD-10-CM | POA: Diagnosis not present

## 2023-11-29 DIAGNOSIS — M9903 Segmental and somatic dysfunction of lumbar region: Secondary | ICD-10-CM | POA: Diagnosis not present

## 2023-11-29 DIAGNOSIS — M4726 Other spondylosis with radiculopathy, lumbar region: Secondary | ICD-10-CM | POA: Diagnosis not present

## 2023-11-29 DIAGNOSIS — M4728 Other spondylosis with radiculopathy, sacral and sacrococcygeal region: Secondary | ICD-10-CM | POA: Diagnosis not present

## 2023-11-29 DIAGNOSIS — M9904 Segmental and somatic dysfunction of sacral region: Secondary | ICD-10-CM | POA: Diagnosis not present

## 2023-12-01 DIAGNOSIS — M4726 Other spondylosis with radiculopathy, lumbar region: Secondary | ICD-10-CM | POA: Diagnosis not present

## 2023-12-01 DIAGNOSIS — M9904 Segmental and somatic dysfunction of sacral region: Secondary | ICD-10-CM | POA: Diagnosis not present

## 2023-12-01 DIAGNOSIS — M4728 Other spondylosis with radiculopathy, sacral and sacrococcygeal region: Secondary | ICD-10-CM | POA: Diagnosis not present

## 2023-12-01 DIAGNOSIS — M9903 Segmental and somatic dysfunction of lumbar region: Secondary | ICD-10-CM | POA: Diagnosis not present

## 2023-12-02 ENCOUNTER — Encounter: Payer: Self-pay | Admitting: Family Medicine

## 2023-12-02 ENCOUNTER — Ambulatory Visit (INDEPENDENT_AMBULATORY_CARE_PROVIDER_SITE_OTHER): Payer: Medicare HMO | Admitting: Family Medicine

## 2023-12-02 VITALS — BP 108/58 | HR 62 | Temp 97.1°F | Wt 166.2 lb

## 2023-12-02 DIAGNOSIS — S065XAA Traumatic subdural hemorrhage with loss of consciousness status unknown, initial encounter: Secondary | ICD-10-CM

## 2023-12-02 DIAGNOSIS — G479 Sleep disorder, unspecified: Secondary | ICD-10-CM | POA: Diagnosis not present

## 2023-12-02 DIAGNOSIS — I10 Essential (primary) hypertension: Secondary | ICD-10-CM | POA: Diagnosis not present

## 2023-12-02 DIAGNOSIS — Z79899 Other long term (current) drug therapy: Secondary | ICD-10-CM

## 2023-12-02 DIAGNOSIS — Z7984 Long term (current) use of oral hypoglycemic drugs: Secondary | ICD-10-CM

## 2023-12-02 DIAGNOSIS — K219 Gastro-esophageal reflux disease without esophagitis: Secondary | ICD-10-CM | POA: Diagnosis not present

## 2023-12-02 DIAGNOSIS — F330 Major depressive disorder, recurrent, mild: Secondary | ICD-10-CM

## 2023-12-02 DIAGNOSIS — N1832 Chronic kidney disease, stage 3b: Secondary | ICD-10-CM | POA: Diagnosis not present

## 2023-12-02 DIAGNOSIS — E1169 Type 2 diabetes mellitus with other specified complication: Secondary | ICD-10-CM

## 2023-12-02 DIAGNOSIS — E063 Autoimmune thyroiditis: Secondary | ICD-10-CM

## 2023-12-02 DIAGNOSIS — E785 Hyperlipidemia, unspecified: Secondary | ICD-10-CM

## 2023-12-02 LAB — MAGNESIUM: Magnesium: 1.7 mg/dL (ref 1.5–2.5)

## 2023-12-02 LAB — B12 AND FOLATE PANEL
Folate: >25.2 ng/mL (ref >5.9)
Vitamin B-12: 1140 pg/mL — ABNORMAL HIGH (ref 211–911)

## 2023-12-02 LAB — COMPREHENSIVE METABOLIC PANEL WITH GFR
ALT: 17 U/L (ref 0–35)
AST: 17 U/L (ref 0–37)
Albumin: 4.3 g/dL (ref 3.5–5.2)
Alkaline Phosphatase: 61 U/L (ref 39–117)
BUN: 18 mg/dL (ref 6–23)
CO2: 28 meq/L (ref 19–32)
Calcium: 10.1 mg/dL (ref 8.4–10.5)
Chloride: 104 meq/L (ref 96–112)
Creatinine, Ser: 1.42 mg/dL — ABNORMAL HIGH (ref 0.40–1.20)
GFR: 37.74 mL/min — ABNORMAL LOW (ref >60.00)
Glucose, Bld: 168 mg/dL — ABNORMAL HIGH (ref 70–99)
Potassium: 4.8 meq/L (ref 3.5–5.1)
Sodium: 140 meq/L (ref 135–145)
Total Bilirubin: 0.4 mg/dL (ref 0.2–1.2)
Total Protein: 6.7 g/dL (ref 6.0–8.3)

## 2023-12-02 LAB — LIPID PANEL
Cholesterol: 139 mg/dL (ref 0–200)
HDL: 39.2 mg/dL (ref >39.00)
LDL Cholesterol: 40 mg/dL (ref 0–99)
NonHDL: 99.52
Total CHOL/HDL Ratio: 4
Triglycerides: 298 mg/dL — ABNORMAL HIGH (ref 0.0–149.0)
VLDL: 59.6 mg/dL — ABNORMAL HIGH (ref 0.0–40.0)

## 2023-12-02 LAB — TSH: TSH: 0.1 u[IU]/mL — ABNORMAL LOW (ref 0.35–5.50)

## 2023-12-02 LAB — HEMOGLOBIN A1C: Hgb A1c MFr Bld: 7.1 % — ABNORMAL HIGH (ref 4.6–6.5)

## 2023-12-02 LAB — VITAMIN D 25 HYDROXY (VIT D DEFICIENCY, FRACTURES): VITD: 33.26 ng/mL (ref 30.00–100.00)

## 2023-12-02 MED ORDER — PANTOPRAZOLE SODIUM 20 MG PO TBEC
20.0000 mg | DELAYED_RELEASE_TABLET | Freq: Every day | ORAL | 1 refills | Status: DC
Start: 2023-12-02 — End: 2024-03-13

## 2023-12-02 MED ORDER — SEMAGLUTIDE (1 MG/DOSE) 4 MG/3ML ~~LOC~~ SOPN: 1.0000 mg | PEN_INJECTOR | SUBCUTANEOUS | 1 refills | Status: DC

## 2023-12-02 MED ORDER — SPIRONOLACTONE 25 MG PO TABS: 25.0000 mg | ORAL_TABLET | Freq: Every day | ORAL | 1 refills | Status: DC

## 2023-12-02 MED ORDER — CARVEDILOL 12.5 MG PO TABS: 12.5000 mg | ORAL_TABLET | Freq: Two times a day (BID) | ORAL | 1 refills | Status: DC

## 2023-12-02 MED ORDER — EMPAGLIFLOZIN 25 MG PO TABS: 25.0000 mg | ORAL_TABLET | Freq: Every day | ORAL | 1 refills | Status: DC

## 2023-12-02 MED ORDER — METFORMIN HCL 1000 MG PO TABS
1000.0000 mg | ORAL_TABLET | Freq: Every day | ORAL | 1 refills | Status: DC
Start: 2023-12-02 — End: 2024-03-13

## 2023-12-02 MED ORDER — MIRTAZAPINE 15 MG PO TABS: ORAL_TABLET | ORAL | 1 refills | Status: DC

## 2023-12-02 MED ORDER — VENLAFAXINE HCL ER 150 MG PO CP24: 150.0000 mg | ORAL_CAPSULE | Freq: Every day | ORAL | 1 refills | Status: DC

## 2023-12-02 NOTE — Patient Instructions (Signed)
 Return in about 15 weeks (around 03/16/2024) for Routine chronic condition follow-up.        Great to see you today.  I have refilled the medication(s) we provide.   If labs were collected or images ordered, we will inform you of  results once we have received them and reviewed. We will contact you either by echart message, or telephone call.  Please give ample time to the testing facility, and our office to run,  receive and review results. Please do not call inquiring of results, even if you can see them in your chart. We will contact you as soon as we are able. If it has been over 1 week since the test was completed, and you have not yet heard from us , then please call us .    - echart message- for normal results that have been seen by the patient already.   - telephone call: abnormal results or if patient has not viewed results in their echart.  If a referral to a specialist was entered for you, please call us  in 2 weeks if you have not heard from the specialist office to schedule.

## 2023-12-02 NOTE — Progress Notes (Signed)
 Daisy Bennett , 1956-03-23, 68 y.o., female MRN: [id number] Patient Care Team    Relationship Specialty Notifications Start End  Mariel Shope, DO PCP - General Family Medicine  03/10/15   Loyde Rule, MD PCP - Cardiology Cardiology Admissions 05/31/18   Haverstock, Thornell Flirt, MD Referring Physician Dermatology  02/10/16   Brian Campanile, MD (Inactive) Consulting Physician Neurology  05/18/16   Dyanna Glasgow, DO Consulting Physician Obstetrics and Gynecology  06/04/16   Julia Oats, OD  Optometry  08/17/16     Chief Complaint  Patient presents with   Diabetes     Subjective: .Daisy Bennett is a 68 y.o. female present for Chronic Conditions/illness Management  Diabetes/morbid obesity: She reports compliance with Amaryl   4 mg bid daily and metformin  1000 mg qd.  Jardiance  25 mg and tolerating ozempic   0.5 mg weekly Patient denies dizziness, hyperglycemic or hypoglycemic events. Patient denies numbness, tingling in the extremities or nonhealing wounds of feet.  .   Hypertension/hyperlipidemia: She reports compliance  with her medication regimen of spironolactone  25 mg daily, Crestor  20 mg daily, losartan  100 mg daily, fish oil 1000 mg daily, fenofibrate  160 mg daily Norvasc  10 mg, baby aspirin  daily, Coreg  12.5 mg twice daily .  H/O CAD, NSTEMI, post stent 2011 (OM)-collateralized RCA.  She follows with Dr. Nishan.  Patient denies chest pain, shortness of breath, dizziness or lower extremity edema.   depression/MS/sleep disturbance:  Patient reports compliance  with Effexor  at 150 mg daily and she remeron  15 mg qhs .       12/02/2023   10:26 AM 08/19/2023   10:36 AM 05/18/2023    9:52 AM 12/29/2022    9:07 AM 12/03/2022    9:43 AM  Depression screen PHQ 2/9  Decreased Interest 0 0 0 0 0  Down, Depressed, Hopeless 0 0 0 0 0  PHQ - 2 Score 0 0 0 0 0  Altered sleeping 0  2    Tired, decreased energy 0  0    Change in appetite 0  0    Feeling bad or failure  about yourself  0  0    Trouble concentrating 0  0    Moving slowly or fidgety/restless 0  0    Suicidal thoughts 0  0    PHQ-9 Score 0  2    Difficult doing work/chores Not difficult at all  Not difficult at all      Allergies  Allergen Reactions   Aubagio [Teriflunomide] Diarrhea    Pancreatitis, neuropathy, elevated glucose   Betaseron [Interferon Beta-1b] Other (See Comments)    Increased LFT's   Provigil  [Modafinil ] Swelling    Tongue swelling and sores   Topamax  [Topiramate ]     Cognitive slowing   Doxycycline  Other (See Comments)    Thrush,dizziness   Social History   Social History Narrative   The patient lives with husband. 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    Patient drinks occasionally, does not chew tobacco, does not use recreational drugs. She does drink occasional caffeine. She does wear her seatbelt. She does wear bike helmet riding a bike. She does exercise 3 times a week. She does not wear hearing aids or dentures. There is a smoke alarm in her home. There are no firearms in her home. She feels safe in her relationship. She has never experienced physical abuse.   Patient sleeps 7-8 hours a night.  Patient drinks 2 cups of caffeine daily.   Patient is right handed.   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   Goiter, unspecified    Heart murmur    History of colonic polyps 2012   Multiple sclerosis (HCC)    Non-ST elevated myocardial infarction (non-STEMI) (HCC)    Olecranon bursitis of left elbow 10/17/2017   Oral mucositis 08/31/2019   SCCA (squamous cell carcinoma) of skin    right shin   SEMI (subendocardial myocardial infarction) (HCC) 09/29/2009   TOTAL RCA WITH STENT TO OM BRANCH   Shingles    Squamous cell carcinoma 2019   lower ext   Traumatic rupture of volar  plate of left ring finger 03/25/2020   Unspecified essential hypertension    Unspecified hypothyroidism    Past Surgical History:  Procedure Laterality Date   CATARACT SURGERY  07/2009   CESAREAN SECTION     CORONARY ANGIOPLASTY WITH STENT PLACEMENT  2011   HAND SURGERY Left 09/2021   tendon surgery   KNEE SURGERY     leg surgery     squamous ccell removed    TENOSYNOVECTOMY Left 10/26/2021   Procedure: LEFT WRIST EXTENSOR TENOSYNOVECTOMY;  Surgeon: Brunilda Capra, MD;  Location: [facility];  Service: Orthopedics;  Laterality: Left;  45 MIN   TONSILLECTOMY     VESICOVAGINAL FISTULA CLOSURE W/ TAH     Family History  Problem Relation Age of Onset   Hypertension Mother    Heart failure Father    Diabetes Father    Aneurysm Brother        THORACIC/ABD ANEURYSM   Prostate cancer Brother 33   Diabetes Brother    Fibromyalgia Sister    Brain cancer Maternal Grandmother 56   Multiple sclerosis Neg Hx    Colon cancer Neg Hx    Esophageal cancer Neg Hx    Allergies as of 12/02/2023       Reactions   Aubagio [teriflunomide] Diarrhea   Pancreatitis, neuropathy, elevated glucose   Betaseron [interferon Beta-1b] Other (See Comments)   Increased LFT's   Provigil  [modafinil ] Swelling   Tongue swelling and sores   Topamax  [topiramate ]    Cognitive slowing   Doxycycline  Other (See Comments)   Thrush,dizziness        Medication List        Accurate as of Dec 02, 2023 10:45 AM. If you have any questions, ask your nurse or doctor.          amLODipine  10 MG tablet Commonly known as: NORVASC  TAKE 1 TABLET EVERY DAY (PLEASE KEEP UPCOMING APPOINTMENT FOR FUTURE REFILLS)   aspirin  EC 81 MG tablet Take 1 tablet (81 mg total) by mouth daily. Swallow whole.   B COMPLEX-B12 PO Take by mouth.   carvedilol  12.5 MG tablet Commonly known as: COREG  Take 1 tablet (12.5 mg total) by mouth 2 (two) times daily.   cholecalciferol  1000 units tablet Commonly known as:  VITAMIN D  Take 2,000 Units by mouth daily.   empagliflozin  25 MG Tabs tablet Commonly known as: Jardiance  Take 1 tablet (25 mg total) by mouth daily before breakfast.   fenofibrate  160 MG tablet Take 1 tablet (160 mg total) by mouth daily.   fish oil-omega-3 fatty acids  1000 MG capsule Take 1 g by mouth  daily.   glimepiride  4 MG tablet Commonly known as: AMARYL  TAKE 1 TABLET BY MOUTH TWICE A DAY   levothyroxine  112 MCG tablet Commonly known as: SYNTHROID  Take 1 tablet (112 mcg total) by mouth daily.   losartan  100 MG tablet Commonly known as: COZAAR  TAKE 1 TABLET EVERY DAY   metFORMIN  1000 MG tablet Commonly known as: GLUCOPHAGE  Take 1 tablet (1,000 mg total) by mouth daily with breakfast.   mirtazapine  15 MG tablet Commonly known as: REMERON  TAKE 1 TABLET BY MOUTH EVERYDAY AT BEDTIME   nitroGLYCERIN  0.4 MG SL tablet Commonly known as: NITROSTAT  PLACE 1 TABLET UNDER THE TONGUE EVERY 5 (FIVE) MINUTES X 3 DOSES AS NEEDED FOR CHEST PAIN.   pantoprazole  20 MG tablet Commonly known as: PROTONIX  Take 1 tablet (20 mg total) by mouth daily.   rosuvastatin  20 MG tablet Commonly known as: CRESTOR  Take 1 tablet (20 mg total) by mouth daily.   Semaglutide  (1 MG/DOSE) 4 MG/3ML Sopn Inject 1 mg into the skin once a week.   spironolactone  25 MG tablet Commonly known as: ALDACTONE  Take 1 tablet (25 mg total) by mouth daily.   venlafaxine  XR 150 MG 24 hr capsule Commonly known as: Effexor  XR Take 1 capsule (150 mg total) by mouth daily with breakfast.        All past medical history, surgical history, allergies, family history, immunizations andmedications were updated in the EMR today and reviewed under the history and medication portions of their EMR.     ROS: Negative, with the exception of above mentioned in HPI   Objective:  BP (!) 108/58   Pulse 62   Temp (!) 97.1 F (36.2 C)   Wt 166 lb 3.2 oz (75.4 kg)   SpO2 98%   BMI 26.83 kg/m  Body mass index is  26.83 kg/m. Physical Exam Vitals and nursing note reviewed.  Constitutional:      General: She is not in acute distress.    Appearance: Normal appearance. She is not ill-appearing, toxic-appearing or diaphoretic.  HENT:     Head: Normocephalic and atraumatic.  Eyes:     General: No scleral icterus.       Right eye: No discharge.        Left eye: No discharge.     Extraocular Movements: Extraocular movements intact.     Conjunctiva/sclera: Conjunctivae normal.     Pupils: Pupils are equal, round, and reactive to light.  Cardiovascular:     Rate and Rhythm: Normal rate and regular rhythm.     Heart sounds: No murmur heard. Pulmonary:     Effort: Pulmonary effort is normal. No respiratory distress.     Breath sounds: Normal breath sounds. No wheezing, rhonchi or rales.  Musculoskeletal:     Right lower leg: No edema.     Left lower leg: No edema.  Skin:    General: Skin is warm.     Findings: No rash.  Neurological:     Mental Status: She is alert and oriented to person, place, and time. Mental status is at baseline.     Motor: No weakness.     Gait: Gait normal.  Psychiatric:        Mood and Affect: Mood normal.        Behavior: Behavior normal.        Thought Content: Thought content normal.        Judgment: Judgment normal.     No results found. No results found. No results found  for this or any previous visit (from the past 24 hours).   Assessment/Plan: CHERIL DROZ is a 68 y.o. female present for OV for Chronic Conditions/illness Management Diabetes mellitus with complication (HCC)/Morbid obesity Stable- losing weight Continue jardiance  25 mg qd Continue Amaryl  to 4 mg every day> if able will decrease or stop after results Continue metformin  1000 mg daily (decreased secondary to kidney function) Tolerating  ozempic  to 1 mg weekly injection.  - increase exercise regimen and continue dietary modification> cut back on sugary drinks. - tapered off gabapentin  (w/  neuro assistance)  POCT HgB A1C--> 5.9> 6.1> 6.6> 8.6> 7.6 > 7.1>7.6> 7.1 > 8.0>7.4> collected today - prevnar 20 completed  -  foot exam completed 08/19/2023 - Eye exam: UTD 10/10/2023-  Dr. Cleatrice Curl, yearly eye exams. Requested. - Microalbumin:collected 05/18/2023> +> collected today - flu shot -pUTD 2024  Hypertension/hyperlipidemia/Antiplatelet or antithrombotic long-term use/athersclerosis/hypokalemia/s/p stent placement:  stable - follows with cardiology, Dr. Stann Earnest who prescribes medications.  - low salt, increase exercise.  Continue   amlodipine  10 Continue losartan  100 Continue   coreg   12.5 mg BID  Continue   spiro 25 mg daily (can stop kdur 10. She already stopped hctz) Continue  fenofibrate  Continue statin Continue baby aspirin .   - maintain routine cardiology follow ups.   Depression/sleep disturbance Stable continue Effexor  150 mg daily Continue  remeron  15 mg every day  Multiple sclerosis (HCC) Managed by neuro. -currently off all meds  GERD: Protonix  20 every day continue  - b12, vitd and mag collected  CKD 3 Avoid NSAIDs when able Renally dose meds when appropriate Vitamin D  levels collected  hypothyroid Continue levo 112 mcg. refills will be provided in appropriate dose based on lab result today TSH collected  Reviewed expectations re: course of current medical issues. Discussed self-management of symptoms. Outlined signs and symptoms indicating need for more acute intervention. Patient verbalized understanding and all questions were answered. Patient received an After-Visit Summary.    Orders Placed This Encounter  Procedures   Comprehensive metabolic panel with GFR   Hemoglobin A1c   Microalbumin, urine   TSH   CBC   Lipid panel   B12 and Folate Panel   Vitamin D  (25 hydroxy)   Magnesium     Meds ordered this encounter  Medications   carvedilol  (COREG ) 12.5 MG tablet    Sig: Take 1 tablet (12.5 mg total) by mouth 2 (two) times  daily.    Dispense:  180 tablet    Refill:  1   empagliflozin  (JARDIANCE ) 25 MG TABS tablet    Sig: Take 1 tablet (25 mg total) by mouth daily before breakfast.    Dispense:  90 tablet    Refill:  1   metFORMIN  (GLUCOPHAGE ) 1000 MG tablet    Sig: Take 1 tablet (1,000 mg total) by mouth daily with breakfast.    Dispense:  90 tablet    Refill:  1   mirtazapine  (REMERON ) 15 MG tablet    Sig: TAKE 1 TABLET BY MOUTH EVERYDAY AT BEDTIME    Dispense:  90 tablet    Refill:  1   pantoprazole  (PROTONIX ) 20 MG tablet    Sig: Take 1 tablet (20 mg total) by mouth daily.    Dispense:  90 tablet    Refill:  1   Semaglutide , 1 MG/DOSE, 4 MG/3ML SOPN    Sig: Inject 1 mg into the skin once a week.    Dispense:  9 mL    Refill:  1   spironolactone  (ALDACTONE ) 25 MG tablet    Sig: Take 1 tablet (25 mg total) by mouth daily.    Dispense:  90 tablet    Refill:  1   venlafaxine  XR (EFFEXOR  XR) 150 MG 24 hr capsule    Sig: Take 1 capsule (150 mg total) by mouth daily with breakfast.    Dispense:  90 capsule    Refill:  1     Referral Orders  No referral(s) requested today     Note is dictated utilizing voice recognition software. Although note has been proof read prior to signing, occasional typographical errors still can be missed. If any questions arise, please do not hesitate to call for verification.   electronically signed by:  Napolean Backbone, DO  [facility] [place]

## 2023-12-03 LAB — MICROALBUMIN, URINE: Microalb, Ur: 2.2 mg/dL

## 2023-12-04 LAB — CBC
HCT: 40.4 % (ref 36.0–46.0)
Hemoglobin: 13.3 g/dL (ref 12.0–15.0)
MCHC: 32.8 g/dL (ref 30.0–36.0)
MCV: 100.1 fl — ABNORMAL HIGH (ref 78.0–100.0)
Platelets: 143 10*3/uL — ABNORMAL LOW (ref 150.0–400.0)
RBC: 4.04 Mil/uL (ref 3.87–5.11)
RDW: 13 % (ref 11.5–15.5)
WBC: 7 10*3/uL (ref 4.0–10.5)

## 2023-12-05 ENCOUNTER — Telehealth: Payer: Self-pay | Admitting: Family Medicine

## 2023-12-05 ENCOUNTER — Other Ambulatory Visit: Payer: Self-pay | Admitting: Family Medicine

## 2023-12-05 MED ORDER — GLIMEPIRIDE 4 MG PO TABS
4.0000 mg | ORAL_TABLET | Freq: Every day | ORAL | 1 refills | Status: DC
Start: 2023-12-05 — End: 2023-12-06

## 2023-12-05 MED ORDER — LEVOTHYROXINE SODIUM 112 MCG PO TABS: ORAL_TABLET | ORAL | 3 refills | Status: DC

## 2023-12-05 NOTE — Telephone Encounter (Signed)
 LVM to discuss. Sent MyChart.

## 2023-12-05 NOTE — Telephone Encounter (Signed)
 Please call patient Her kidney function decreased just a small amount from last visit.  Encouraged her to increase her hydration levels. LDL/bad cholesterol is at goal.  Triglycerides are high if she was fasting 6 to 9 hours.  Would encourage her to make sure she is taking the fenofibrate . Folate, B12 and vitamin D  levels are normal. Blood cell counts and electrolytes are normal. Urine microalbumin now normal.  Diabetes/A1c: Came down a little bit more from 7.4, now 7.1.>  We will keep diabetes regimen the same for now.   Thyroid  is just mildly oversupplemented-I have called in refills for her at the same dose, with the only changes being that she will take 1 tab 6 days a week and a half a tab 1 day a week

## 2023-12-06 DIAGNOSIS — M4726 Other spondylosis with radiculopathy, lumbar region: Secondary | ICD-10-CM | POA: Diagnosis not present

## 2023-12-06 DIAGNOSIS — M9903 Segmental and somatic dysfunction of lumbar region: Secondary | ICD-10-CM | POA: Diagnosis not present

## 2023-12-06 DIAGNOSIS — M4728 Other spondylosis with radiculopathy, sacral and sacrococcygeal region: Secondary | ICD-10-CM | POA: Diagnosis not present

## 2023-12-06 DIAGNOSIS — M9904 Segmental and somatic dysfunction of sacral region: Secondary | ICD-10-CM | POA: Diagnosis not present

## 2023-12-07 DIAGNOSIS — M9903 Segmental and somatic dysfunction of lumbar region: Secondary | ICD-10-CM | POA: Diagnosis not present

## 2023-12-07 DIAGNOSIS — M4728 Other spondylosis with radiculopathy, sacral and sacrococcygeal region: Secondary | ICD-10-CM | POA: Diagnosis not present

## 2023-12-07 DIAGNOSIS — M9904 Segmental and somatic dysfunction of sacral region: Secondary | ICD-10-CM | POA: Diagnosis not present

## 2023-12-07 DIAGNOSIS — M4726 Other spondylosis with radiculopathy, lumbar region: Secondary | ICD-10-CM | POA: Diagnosis not present

## 2023-12-08 DIAGNOSIS — D485 Neoplasm of uncertain behavior of skin: Secondary | ICD-10-CM | POA: Diagnosis not present

## 2023-12-08 DIAGNOSIS — L905 Scar conditions and fibrosis of skin: Secondary | ICD-10-CM | POA: Diagnosis not present

## 2023-12-12 DIAGNOSIS — M9903 Segmental and somatic dysfunction of lumbar region: Secondary | ICD-10-CM | POA: Diagnosis not present

## 2023-12-12 DIAGNOSIS — M9904 Segmental and somatic dysfunction of sacral region: Secondary | ICD-10-CM | POA: Diagnosis not present

## 2023-12-12 DIAGNOSIS — M4728 Other spondylosis with radiculopathy, sacral and sacrococcygeal region: Secondary | ICD-10-CM | POA: Diagnosis not present

## 2023-12-12 DIAGNOSIS — M4726 Other spondylosis with radiculopathy, lumbar region: Secondary | ICD-10-CM | POA: Diagnosis not present

## 2023-12-13 DIAGNOSIS — M9903 Segmental and somatic dysfunction of lumbar region: Secondary | ICD-10-CM | POA: Diagnosis not present

## 2023-12-13 DIAGNOSIS — M9904 Segmental and somatic dysfunction of sacral region: Secondary | ICD-10-CM | POA: Diagnosis not present

## 2023-12-13 DIAGNOSIS — M4726 Other spondylosis with radiculopathy, lumbar region: Secondary | ICD-10-CM | POA: Diagnosis not present

## 2023-12-13 DIAGNOSIS — M4728 Other spondylosis with radiculopathy, sacral and sacrococcygeal region: Secondary | ICD-10-CM | POA: Diagnosis not present

## 2023-12-29 DIAGNOSIS — M4728 Other spondylosis with radiculopathy, sacral and sacrococcygeal region: Secondary | ICD-10-CM | POA: Diagnosis not present

## 2023-12-29 DIAGNOSIS — M9903 Segmental and somatic dysfunction of lumbar region: Secondary | ICD-10-CM | POA: Diagnosis not present

## 2023-12-29 DIAGNOSIS — M4726 Other spondylosis with radiculopathy, lumbar region: Secondary | ICD-10-CM | POA: Diagnosis not present

## 2023-12-29 DIAGNOSIS — M9904 Segmental and somatic dysfunction of sacral region: Secondary | ICD-10-CM | POA: Diagnosis not present

## 2024-01-13 DIAGNOSIS — Z0100 Encounter for examination of eyes and vision without abnormal findings: Secondary | ICD-10-CM | POA: Diagnosis not present

## 2024-01-25 ENCOUNTER — Ambulatory Visit: Payer: Medicare HMO

## 2024-01-25 VITALS — Ht 66.0 in | Wt 160.0 lb

## 2024-01-25 DIAGNOSIS — Z0000 Encounter for general adult medical examination without abnormal findings: Secondary | ICD-10-CM

## 2024-01-25 NOTE — Patient Instructions (Signed)
 [name] , Thank you for taking time to come for your Medicare Wellness Visit. I appreciate your ongoing commitment to your health goals. Please review the following plan we discussed and let me know if I can assist you in the future.   Screening recommendations/referrals: Colonoscopy: up to date Mammogram: up to date Bone Density: up to date Recommended yearly ophthalmology/optometry visit for glaucoma screening and checkup Recommended yearly dental visit for hygiene and checkup  Vaccinations: Influenza vaccine: up to date Pneumococcal vaccine: up to date Tdap vaccine: Education provided      Preventive Care 65 Years and Older, Female Preventive care refers to lifestyle choices and visits with your health care provider that can promote health and wellness. What does preventive care include? A yearly physical exam. This is also called an annual well check. Dental exams once or twice a year. Routine eye exams. Ask your health care provider how often you should have your eyes checked. Personal lifestyle choices, including: Daily care of your teeth and gums. Regular physical activity. Eating a healthy diet. Avoiding tobacco and drug use. Limiting alcohol use. Practicing safe sex. Taking low-dose aspirin  every day. Taking vitamin and mineral supplements as recommended by your health care provider. What happens during an annual well check? The services and screenings done by your health care provider during your annual well check will depend on your age, overall health, lifestyle risk factors, and family history of disease. Counseling  Your health care provider may ask you questions about your: Alcohol use. Tobacco use. Drug use. Emotional well-being. Home and relationship well-being. Sexual activity. Eating habits. History of falls. Memory and ability to understand (cognition). Work and work Astronomer. Reproductive health. Screening  You may have the following tests or  measurements: Height, weight, and BMI. Blood pressure. Lipid and cholesterol levels. These may be checked every 5 years, or more frequently if you are over 31 years old. Skin check. Lung cancer screening. You may have this screening every year starting at age 75 if you have a 30-pack-year history of smoking and currently smoke or have quit within the past 15 years. Fecal occult blood test (FOBT) of the stool. You may have this test every year starting at age 41. Flexible sigmoidoscopy or colonoscopy. You may have a sigmoidoscopy every 5 years or a colonoscopy every 10 years starting at age 73. Hepatitis C blood test. Hepatitis B blood test. Sexually transmitted disease (STD) testing. Diabetes screening. This is done by checking your blood sugar (glucose) after you have not eaten for a while (fasting). You may have this done every 1-3 years. Bone density scan. This is done to screen for osteoporosis. You may have this done starting at age 25. Mammogram. This may be done every 1-2 years. Talk to your health care provider about how often you should have regular mammograms. Talk with your health care provider about your test results, treatment options, and if necessary, the need for more tests. Vaccines  Your health care provider may recommend certain vaccines, such as: Influenza vaccine. This is recommended every year. Tetanus, diphtheria, and acellular pertussis (Tdap, Td) vaccine. You may need a Td booster every 10 years. Zoster vaccine. You may need this after age 10. Pneumococcal 13-valent conjugate (PCV13) vaccine. One dose is recommended after age 31. Pneumococcal polysaccharide (PPSV23) vaccine. One dose is recommended after age 11. Talk to your health care provider about which screenings and vaccines you need and how often you need them. This information is not intended to replace  advice given to you by your health care provider. Make sure you discuss any questions you have with your  health care provider. Document Released: 08/15/2015 Document Revised: 04/07/2016 Document Reviewed: 05/20/2015 Elsevier Interactive Patient Education  2017 [employer].  Fall Prevention in the Home Falls can cause injuries. They can happen to people of all ages. There are many things you can do to make your home safe and to help prevent falls. What can I do on the outside of my home? Regularly fix the edges of walkways and driveways and fix any cracks. Remove anything that might make you trip as you walk through a door, such as a raised step or threshold. Trim any bushes or trees on the path to your home. Use bright outdoor lighting. Clear any walking paths of anything that might make someone trip, such as rocks or tools. Regularly check to see if handrails are loose or broken. Make sure that both sides of any steps have handrails. Any raised decks and porches should have guardrails on the edges. Have any leaves, snow, or ice cleared regularly. Use sand or salt on walking paths during winter. Clean up any spills in your garage right away. This includes oil or grease spills. What can I do in the bathroom? Use night lights. Install grab bars by the toilet and in the tub and shower. Do not use towel bars as grab bars. Use non-skid mats or decals in the tub or shower. If you need to sit down in the shower, use a plastic, non-slip stool. Keep the floor dry. Clean up any water that spills on the floor as soon as it happens. Remove soap buildup in the tub or shower regularly. Attach bath mats securely with double-sided non-slip rug tape. Do not have throw rugs and other things on the floor that can make you trip. What can I do in the bedroom? Use night lights. Make sure that you have a light by your bed that is easy to reach. Do not use any sheets or blankets that are too big for your bed. They should not hang down onto the floor. Have a firm chair that has side arms. You can use this for  support while you get dressed. Do not have throw rugs and other things on the floor that can make you trip. What can I do in the kitchen? Clean up any spills right away. Avoid walking on wet floors. Keep items that you use a lot in easy-to-reach places. If you need to reach something above you, use a strong step stool that has a grab bar. Keep electrical cords out of the way. Do not use floor polish or wax that makes floors slippery. If you must use wax, use non-skid floor wax. Do not have throw rugs and other things on the floor that can make you trip. What can I do with my stairs? Do not leave any items on the stairs. Make sure that there are handrails on both sides of the stairs and use them. Fix handrails that are broken or loose. Make sure that handrails are as long as the stairways. Check any carpeting to make sure that it is firmly attached to the stairs. Fix any carpet that is loose or worn. Avoid having throw rugs at the top or bottom of the stairs. If you do have throw rugs, attach them to the floor with carpet tape. Make sure that you have a light switch at the top of the stairs and the bottom  of the stairs. If you do not have them, ask someone to add them for you. What else can I do to help prevent falls? Wear shoes that: Do not have high heels. Have rubber bottoms. Are comfortable and fit you well. Are closed at the toe. Do not wear sandals. If you use a stepladder: Make sure that it is fully opened. Do not climb a closed stepladder. Make sure that both sides of the stepladder are locked into place. Ask someone to hold it for you, if possible. Clearly mark and make sure that you can see: Any grab bars or handrails. First and last steps. Where the edge of each step is. Use tools that help you move around (mobility aids) if they are needed. These include: Canes. Walkers. Scooters. Crutches. Turn on the lights when you go into a dark area. Replace any light bulbs as soon  as they burn out. Set up your furniture so you have a clear path. Avoid moving your furniture around. If any of your floors are uneven, fix them. If there are any pets around you, be aware of where they are. Review your medicines with your doctor. Some medicines can make you feel dizzy. This can increase your chance of falling. Ask your doctor what other things that you can do to help prevent falls. This information is not intended to replace advice given to you by your health care provider. Make sure you discuss any questions you have with your health care provider. Document Released: 05/15/2009 Document Revised: 12/25/2015 Document Reviewed: 08/23/2014 Elsevier Interactive Patient Education  2017 [employer].

## 2024-01-25 NOTE — Progress Notes (Signed)
 Subjective:   Daisy Bennett is a 68 y.o. female who presents for Medicare Annual (Subsequent) preventive examination.  Visit Complete: Virtual I connected with  Daisy Bennett on 01/25/24 by a audio enabled telemedicine application and verified that I am speaking with the correct person using two identifiers.  Patient Location: Home  Provider Location: Home Office  I discussed the limitations of evaluation and management by telemedicine. The patient expressed understanding and agreed to proceed.  Vital Signs: Because this visit was a virtual/telehealth visit, some criteria may be missing or patient reported. Any vitals not documented were not able to be obtained and vitals that have been documented are patient reported.  Patient Medicare AWV questionnaire was completed by the patient on 01-24-2024; I have confirmed that all information answered by patient is correct and no changes since this date.  Cardiac Risk Factors include: advanced age (>51men, >69 women);hypertension     Objective:    Today's Vitals   01/25/24 1031  Weight: 160 lb (72.6 kg)  Height: 5' 6 (1.676 m)   Body mass index is 25.82 kg/m.     01/25/2024   10:28 AM 12/29/2022    9:06 AM 08/07/2022   12:23 PM 05/20/2022    2:19 PM 01/04/2022    9:50 PM 12/02/2021   11:03 AM 10/26/2021   11:24 AM  Advanced Directives  Does Patient Have a Medical Advance Directive? Yes Yes No No No Yes No  Type of Estate agent of [employer] Power of [place];Living will    Healthcare Power of Attorney   Does patient want to make changes to medical advance directive?    No - Patient declined     Copy of Healthcare Power of Attorney in Chart?  No - copy requested    Yes - validated most recent copy scanned in chart (See row information)   Would patient like information on creating a medical advance directive?     No - Patient declined  No - Patient declined    Current Medications (verified) Outpatient  Encounter Medications as of 01/25/2024  Medication Sig   amLODipine  (NORVASC ) 10 MG tablet TAKE 1 TABLET EVERY DAY (PLEASE KEEP UPCOMING APPOINTMENT FOR FUTURE REFILLS)   aspirin  EC 81 MG tablet Take 1 tablet (81 mg total) by mouth daily. Swallow whole.   B Complex Vitamins (B COMPLEX-B12 PO) Take by mouth.   carvedilol  (COREG ) 12.5 MG tablet Take 1 tablet (12.5 mg total) by mouth 2 (two) times daily.   cholecalciferol  (VITAMIN D ) 1000 units tablet Take 2,000 Units by mouth daily.   empagliflozin  (JARDIANCE ) 25 MG TABS tablet Take 1 tablet (25 mg total) by mouth daily before breakfast.   fenofibrate  160 MG tablet Take 1 tablet (160 mg total) by mouth daily.   fish oil-omega-3 fatty acids  1000 MG capsule Take 1 g by mouth daily.   glimepiride  (AMARYL ) 4 MG tablet Take 1 tablet (4 mg total) by mouth daily with breakfast.   levothyroxine  (SYNTHROID ) 112 MCG tablet 1 tab p.o. on empty stomach 6 days a week, half a tab 1 day a week   losartan  (COZAAR ) 100 MG tablet TAKE 1 TABLET EVERY DAY   metFORMIN  (GLUCOPHAGE ) 1000 MG tablet Take 1 tablet (1,000 mg total) by mouth daily with breakfast.   mirtazapine  (REMERON ) 15 MG tablet TAKE 1 TABLET BY MOUTH EVERYDAY AT BEDTIME   nitroGLYCERIN  (NITROSTAT ) 0.4 MG SL tablet PLACE 1 TABLET UNDER THE TONGUE EVERY 5 (FIVE) MINUTES X 3  DOSES AS NEEDED FOR CHEST PAIN.   pantoprazole  (PROTONIX ) 20 MG tablet Take 1 tablet (20 mg total) by mouth daily.   rosuvastatin  (CRESTOR ) 20 MG tablet Take 1 tablet (20 mg total) by mouth daily.   Semaglutide , 1 MG/DOSE, 4 MG/3ML SOPN Inject 1 mg into the skin once a week.   spironolactone  (ALDACTONE ) 25 MG tablet Take 1 tablet (25 mg total) by mouth daily.   venlafaxine  XR (EFFEXOR  XR) 150 MG 24 hr capsule Take 1 capsule (150 mg total) by mouth daily with breakfast.   No facility-administered encounter medications on file as of 01/25/2024.    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   Goiter, unspecified    Heart murmur    History of colonic polyps 2012   Multiple sclerosis (HCC)    Non-ST elevated myocardial infarction (non-STEMI) (HCC)    Olecranon bursitis of left elbow 10/17/2017   Oral mucositis 08/31/2019   SCCA (squamous cell carcinoma) of skin    right shin   SEMI (subendocardial myocardial infarction) (HCC) 09/29/2009   TOTAL RCA WITH STENT TO OM BRANCH   Shingles    Squamous cell carcinoma 2019   lower ext   Traumatic rupture of volar plate of left ring finger 03/25/2020   Unspecified essential hypertension    Unspecified hypothyroidism    Past Surgical History:  Procedure Laterality Date   CATARACT SURGERY  07/2009   CESAREAN SECTION     CORONARY ANGIOPLASTY WITH STENT PLACEMENT  2011   HAND SURGERY Left 09/2021   tendon surgery   KNEE SURGERY     leg surgery     squamous ccell removed    TENOSYNOVECTOMY Left 10/26/2021   Procedure: LEFT WRIST EXTENSOR TENOSYNOVECTOMY;  Surgeon: Murrell Drivers, MD;  Location: [facility];  Service: Orthopedics;  Laterality: Left;  45 MIN   TONSILLECTOMY     VESICOVAGINAL FISTULA CLOSURE W/ TAH     Family History  Problem Relation Age of Onset   Hypertension Mother    Heart failure Father    Diabetes Father    Aneurysm Brother        THORACIC/ABD ANEURYSM   Prostate cancer Brother 32   Diabetes Brother    Fibromyalgia Sister    Brain cancer Maternal Grandmother 74   Multiple sclerosis Neg Hx    Colon cancer Neg Hx    Esophageal cancer Neg Hx    Social History   Socioeconomic History   Marital status: Married    Spouse name: Not on file   Number of children: 2   Years of education: 14   Highest education level: Associate  degree: academic program  Occupational History   Occupation: Physiological scientist  Tobacco Use   Smoking status: Former    Current packs/day: 0.00    Average packs/day: 1 pack/day for 15.0 years (15.0 ttl pk-yrs)    Types: Cigarettes    Start date: 12/15/2000    Quit date: 12/16/2015    Years since quitting: 8.1   Smokeless tobacco: Never   Tobacco comments:    on and off smoker  Vaping Use   Vaping status: Never Used  Substance and Sexual Activity   Alcohol use:  Yes    Alcohol/week: 0.0 standard drinks of alcohol    Comment: OCCASIONAL ALCOHOL USE.   Drug use: No   Sexual activity: Yes  Other Topics Concern   Not on file  Social History Narrative   The patient lives with husband. 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    Patient drinks occasionally, does not chew tobacco, does not use recreational drugs. She does drink occasional caffeine. She does wear her seatbelt. She does wear bike helmet riding a bike. She does exercise 3 times a week. She does not wear hearing aids or dentures. There is a smoke alarm in her home. There are no firearms in her home. She feels safe in her relationship. She has never experienced physical abuse.   Patient sleeps 7-8 hours a night.   Patient drinks 2 cups of caffeine daily.   Patient is right handed.   Social Drivers of Corporate investment banker Strain: Low Risk  (01/24/2024)   Overall Financial Resource Strain (CARDIA)    Difficulty of Paying Living Expenses: Not hard at all  Food Insecurity: No Food Insecurity (01/24/2024)   Hunger Vital Sign    Worried About Running Out of Food in the Last Year: Never true    Ran Out of Food in the Last Year: Never true  Transportation Needs: No Transportation Needs (01/24/2024)   PRAPARE - Administrator, Civil Service (Medical): No    Lack of Transportation (Non-Medical): No  Physical Activity: Insufficiently Active (08/16/2023)   Exercise Vital Sign    Days  of Exercise per Week: 2 days    Minutes of Exercise per Session: 10 min  Stress: No Stress Concern Present (08/16/2023)   [employer] of Occupational Health - Occupational Stress Questionnaire    Feeling of Stress : Not at all  Social Connections: Socially Integrated (01/24/2024)   Social Connection and Isolation Panel    Frequency of Communication with Friends and Family: Twice a week    Frequency of Social Gatherings with Friends and Family: Three times a week    Attends Religious Services: 1 to 4 times per year    Active Member of Clubs or Organizations: Yes    Attends Banker Meetings: 1 to 4 times per year    Marital Status: Married    Tobacco Counseling Counseling given: Not Answered Tobacco comments: on and off smoker   Clinical Intake:  Pre-visit preparation completed: Yes  Pain : No/denies pain     Diabetes: Yes CBG done?: No Did pt. bring in CBG monitor from home?: No  How often do you need to have someone help you when you read instructions, pamphlets, or other written materials from your doctor or pharmacy?: 1 - Never  Interpreter Needed?: No  Information entered by :: Mliss Graff LPN   Activities of Daily Living    01/25/2024   10:31 AM 01/24/2024    2:16 PM  In your present state of health, do you have any difficulty performing the following activities:  Hearing? 0 0  Vision? 0 0  Difficulty concentrating or making decisions? 0 0  Walking or climbing stairs? 0 0  Dressing or bathing? 0 0  Doing errands, shopping? 0 0  Preparing Food and eating ? N N  Using the Toilet? N N  In the past six months, have you accidently leaked urine? N N  Do you have problems with loss of bowel control? N N  Managing  your Medications? N N  Managing your Finances? N N  Housekeeping or managing your Housekeeping? N N    Patient Care Team: Catherine Charlies LABOR, DO as PCP - General (Family Medicine) Delford Maude BROCKS, MD as PCP - Cardiology  (Cardiology) Haver[place], Tawni CROME, MD as Referring Physician (Dermatology) Jenel Carlin POUR, MD (Inactive) as Consulting Physician (Neurology) Dannielle Bouchard, DO as Consulting Physician (Obstetrics and Gynecology) Mevelyn JONETTA Bathe, OD (Optometry)  Indicate any recent Medical Services you may have received from other than [facility] providers in the past year (date may be approximate).     Assessment:   This is a routine wellness examination for [facility].  Hearing/Vision screen Hearing Screening - Comments:: Does not wear hearing aids Vision Screening - Comments:: Up to date [facility]   Goals Addressed   None    Depression Screen    01/25/2024   10:32 AM 12/02/2023   10:26 AM 08/19/2023   10:36 AM 05/18/2023    9:52 AM 12/29/2022    9:07 AM 12/03/2022    9:43 AM 11/26/2022    9:46 AM  PHQ 2/9 Scores  PHQ - 2 Score 0 0 0 0 0 0 0  PHQ- 9 Score 2 0  2       Fall Risk    01/25/2024   10:30 AM 01/24/2024    2:16 PM 12/02/2023   10:26 AM 08/19/2023   10:36 AM 08/16/2023    9:58 AM  Fall Risk   Falls in the past year? 0 0 0 0 0  Number falls in past yr: 0  0 0   Injury with Fall? 0  0 0   Risk for fall due to :   No Fall Risks No Fall Risks   Follow up Falls evaluation completed;Education provided;Falls prevention discussed  Falls evaluation completed Falls evaluation completed     MEDICARE RISK AT HOME: Medicare Risk at Home Any stairs in or around the home?: Yes If so, are there any without handrails?: Yes Home free of loose throw rugs in walkways, pet beds, electrical cords, etc?: Yes Adequate lighting in your home to reduce risk of falls?: Yes Life alert?: No Use of a cane, walker or w/c?: No Grab bars in the bathroom?: No Shower chair or bench in shower?: No Elevated toilet seat or a handicapped toilet?: No  TIMED UP AND GO:  Was the test performed?  No    Cognitive Function:    02/12/2019   10:03 AM 08/10/2018    9:03 AM 06/22/2017    9:33 AM 10/12/2016    2:53 PM  03/10/2016    9:08 AM  MMSE - Mini Mental State Exam  Not completed:  Refused     Orientation to time 4  4  5  5    Orientation to Place 5  5  5  5    Registration 3  3  3  3    Attention/ Calculation 5  5  5  5    Recall 3  2  2  2    Language- name 2 objects 2  2  2  2    Language- repeat 1  1 1 1   Language- follow 3 step command 3  3  3  3    Language- read & follow direction 1  1  1  1    Write a sentence 1  1  1  1    Copy design 1  1  1  1    Total score 29  28  29  29      Data saved with a previous flowsheet row definition        01/25/2024   10:30 AM 12/29/2022    9:09 AM 12/02/2021   11:06 AM  6CIT Screen  What Year? 0 points 0 points 0 points  What month? 0 points 0 points 0 points  What time? 0 points 0 points 0 points  Count back from 20 0 points 0 points 0 points  Months in reverse 0 points 0 points 0 points  Repeat phrase 0 points 0 points 0 points  Total Score 0 points 0 points 0 points    Immunizations Immunization History  Administered Date(s) Administered   Fluad Quad(high Dose 65+) 07/04/2019, 06/05/2020, 04/02/2021, 04/13/2022   Fluad Trivalent(High Dose 65+) 05/18/2023   Hepatitis A, Adult 10/17/2017, 05/18/2018   Influenza,inj,Quad PF,6+ Mos 04/16/2015, 05/12/2016, 06/17/2017, 05/18/2018   PFIZER Comirnaty(Gray Top)Covid-19 Tri-Sucrose Vaccine 08/27/2020   PFIZER(Purple Top)SARS-COV-2 Vaccination 07/23/2020   PNEUMOCOCCAL CONJUGATE-20 03/03/2021   Pneumococcal Conjugate-13 11/15/2016   Pneumococcal Polysaccharide-23 08/02/2009, 10/14/2015   Tdap 12/26/2013   Zoster Recombinant(Shingrix) 10/22/2019, 02/07/2020    TDAP status: Due, Education has been provided regarding the importance of this vaccine. Advised may receive this vaccine at local pharmacy or Health Dept. Aware to provide a copy of the vaccination record if obtained from local pharmacy or Health Dept. Verbalized acceptance and understanding.  Flu Vaccine status: Up to date  Pneumococcal vaccine  status: Up to date  Covid-19 vaccine status: Information provided on how to obtain vaccines.   Qualifies for Shingles Vaccine? No   Zostavax completed Yes   Shingrix Completed?: Yes  Screening Tests Health Maintenance  Topic Date Due   DTaP/Tdap/Td (2 - Td or Tdap) 12/27/2023   INFLUENZA VACCINE  03/02/2024   Diabetic kidney evaluation - Urine ACR  05/17/2024   HEMOGLOBIN A1C  06/03/2024   FOOT EXAM  08/18/2024   OPHTHALMOLOGY EXAM  10/09/2024   MAMMOGRAM  10/19/2024   Diabetic kidney evaluation - eGFR measurement  12/01/2024   Medicare Annual Wellness (AWV)  01/24/2025   DEXA SCAN  02/17/2026   Colonoscopy  07/17/2031   Pneumococcal Vaccine: 50+ Years  Completed   Hepatitis C Screening  Completed   Zoster Vaccines- Shingrix  Completed   Hepatitis B Vaccines  Aged Out   HPV VACCINES  Aged Out   Meningococcal B Vaccine  Aged Out   COVID-19 Vaccine  Discontinued    Health Maintenance  Health Maintenance Due  Topic Date Due   DTaP/Tdap/Td (2 - Td or Tdap) 12/27/2023    Colorectal cancer screening: Type of screening: Colonoscopy. Completed 2022. Repeat every 10 years  Mammogram status: Completed  . Repeat every year  Bone Density  completes through [facility]    Education provided  Lung Cancer Screening: (Low Dose CT Chest recommended if Age 38-80 years, 20 pack-year currently smoking OR have quit w/in 15years.) does not qualify.   Lung Cancer Screening Referral:   Additional Screening:  Hepatitis C Screening: does not qualify; Completed 2017  Vision Screening: Recommended annual ophthalmology exams for early detection of glaucoma and other disorders of the eye. Is the patient up to date with their annual eye exam?  Yes  Who is the provider or what is the name of the office in which the patient attends annual eye exams? [facility] If pt is not established with a provider, would they like to be referred to a provider to establish care? No .  Dental  Screening: Recommended annual dental exams for proper oral hygiene  Nutrition Risk Assessment:  Has the patient had any N/V/D within the last 2 months?  No  Does the patient have any non-healing wounds?  No  Has the patient had any unintentional weight loss or weight gain?  No   Diabetes:  Is the patient diabetic?  Yes  If diabetic, was a CBG obtained today?  No  Did the patient bring in their glucometer from home?  No  How often do you monitor your CBG's? At MD visits.   Financial Strains and Diabetes Management:  Are you having any financial strains with the device, your supplies or your medication? No .  Does the patient want to be seen by Chronic Care Management for management of their diabetes?  No  Would the patient like to be referred to a Nutritionist or for Diabetic Management?  No   Diabetic Exams:  Diabetic Eye Exam: Pt has been advised about the importance in completing this exam.   Diabetic Foot Exam: . Pt has been advised about the importance in completing this exam..    Community Resource Referral / Chronic Care Management: CRR required this visit?  No   CCM required this visit?  No     Plan:     I have personally reviewed and noted the following in the patient's chart:   Medical and social history Use of alcohol, tobacco or illicit drugs  Current medications and supplements including opioid prescriptions. Patient is not currently taking opioid prescriptions. Functional ability and status Nutritional status Physical activity Advanced directives List of other physicians Hospitalizations, surgeries, and ER visits in previous 12 months Vitals Screenings to include cognitive, depression, and falls Referrals and appointments  In addition, I have reviewed and discussed with patient certain preventive protocols, quality metrics, and best practice recommendations. A written personalized care plan for preventive services as well as general preventive health  recommendations were provided to patient.     Mliss Graff, LPN   [id number]   After Visit Summary: (MyChart) Due to this being a telephonic visit, the after visit summary with patients personalized plan was offered to patient via MyChart   Nurse Notes:

## 2024-02-02 ENCOUNTER — Other Ambulatory Visit: Payer: Self-pay | Admitting: Cardiovascular Disease

## 2024-02-08 ENCOUNTER — Encounter: Payer: Self-pay | Admitting: Neurology

## 2024-02-08 ENCOUNTER — Ambulatory Visit: Payer: Medicare HMO | Admitting: Neurology

## 2024-02-08 VITALS — BP 138/60 | HR 59 | Ht 66.0 in | Wt 160.5 lb

## 2024-02-08 DIAGNOSIS — G35 Multiple sclerosis: Secondary | ICD-10-CM | POA: Diagnosis not present

## 2024-02-08 NOTE — Addendum Note (Signed)
 Addended by: GAYLAND LAURAINE PARAS on: 02/08/2024 11:21 AM   Modules accepted: Level of Service

## 2024-02-08 NOTE — Progress Notes (Signed)
 Patient: Daisy Bennett Date of Birth: [birth year]  Reason for Visit: Follow up History from: Patient Primary Neurologist: Sater   ASSESSMENT AND PLAN 68 y.o. year old female   1.  Multiple sclerosis -Continues to do very well, has remained off MS medication since Sept 2022 (Aubagio) -Continue to monitor her MS symptoms.  Call for any worsening.  Continue to remain active, exercise. -Has decided she does not wish to pursue MRI imaging for surveillance -Follow up in 1 year or sooner if needed  HISTORY OF PRESENT ILLNESS: Update 02/08/24 SS: Doing well.  MS stable.  Remains off MS medication.  Decided not to pursue MRI of the brain last year. Claims wouldn't go on MS medication anyway.  Vision is fine, no weakness to the arms or legs.  Occasionally nocturnal leg cramps. [place].   Update 02/08/23 SS: Had MRI of the brain December 2023 at [facility]. No active lesions (report below). Remains off MS medications (off Aubagio since Sept 2022). Has bladder incontinence, urgency, worsens overtime. A few cramps to feet. Takes magnesium . Otherwise doing very well.   IMPRESSION:  White matter lesions distribution reflecting history of demyelinating disease. No active enhancing lesion   #  Brain parenchyma: There are scattered and confluent high T2/FLAIR signal lesions within the periventricular and subcortical white matter including in the corpus collosum, distribution reflecting history of demyelinating disease. No infratentorial lesions. No active enhancing lesions.   Update 07/01/22 SS: She is doing well. Is off gabapentin , her memory is significantly improved. Rarely left leg bothers her. Feels good. Vision is okay. No weakness to arms or legs. 1 fall, she tripped in the summer, broke both her little fingers. B/B are fine. Feels good energy.  Has no concerns, is glad to be off MS medication.  HISTORY Dr. Vear 12/30/2021: HISTORY OF  PRESENT ILLNESS:  I had the pleasure of seeing Daisy Bennett at the [facility] at [facility] for a neurologic transfer care regarding her multiple sclerosis.  She had been a patient of Dr. Jenel in the past.    She was diagnosed with MS in the 1990s after presenting with hearing loss followed by numbness from the waist down.  She is currently not on any disease modifying therapy.   Currently, her gait  and balance are mildly reduced but she walks without support.     She had a trip/fall last week but this was first fall in a couple years.    She had done PT in past for her balance.   She had another fall in 2017 causing a falcine subdural hematoma.    She no longer has left leg numbness but sensation is mildly altered.    Vision is doing well.   Bladder function is ok though she has occasional urgency.        She notes some fatigue but this usually does not limit her ability to do what she sets out to do.  She usually sleeps well.  She denies depression or major difficulties with cognition.     MS History The patient was diagnosed with multiple sclerosis at [facility] in the 1990s after presenting with left hearing loss.   She had an MRI performed worrisome for MS.   She then had left numbness form the waist down and was officially diagnosed.   She was placed on Betaseron and continued until 2015.  She was seen by Dr.  Sumner through our office in 2014 and Dr. Jenel between 2015 and 2022.  She had been on Tecfidera  but was noted to have low absolute lymphocyte counts and was switched over to Aubagio.  Due to diarrhea, she stopped in September 2022.    She is on metformin  but this medication predates the onset of her diarrhea.  REVIEW OF SYSTEMS: Out of a complete 14 system review of symptoms, the patient complains only of the following symptoms, and all other reviewed systems are negative.  See HPI  ALLERGIES: Allergies  Allergen Reactions   Aubagio [Teriflunomide]  Diarrhea    Pancreatitis, neuropathy, elevated glucose   Betaseron [Interferon Beta-1b] Other (See Comments)    Increased LFT's   Provigil  [Modafinil ] Swelling    Tongue swelling and sores   Topamax  [Topiramate ]     Cognitive slowing   Doxycycline  Other (See Comments)    Thrush,dizziness    HOME MEDICATIONS: Outpatient Medications Prior to Visit  Medication Sig Dispense Refill   amLODipine  (NORVASC ) 10 MG tablet Take 1 tablet (10 mg total) by mouth daily. 90 tablet 0   aspirin  EC 81 MG tablet Take 1 tablet (81 mg total) by mouth daily. Swallow whole. 90 tablet 3   B Complex Vitamins (B COMPLEX-B12 PO) Take by mouth.     carvedilol  (COREG ) 12.5 MG tablet Take 1 tablet (12.5 mg total) by mouth 2 (two) times daily. 180 tablet 1   cholecalciferol  (VITAMIN D ) 1000 units tablet Take 2,000 Units by mouth daily.     empagliflozin  (JARDIANCE ) 25 MG TABS tablet Take 1 tablet (25 mg total) by mouth daily before breakfast. 90 tablet 1   fenofibrate  160 MG tablet Take 1 tablet (160 mg total) by mouth daily. 90 tablet 3   fish oil-omega-3 fatty acids  1000 MG capsule Take 1 g by mouth daily.     glimepiride  (AMARYL ) 4 MG tablet Take 1 tablet (4 mg total) by mouth daily with breakfast. 90 tablet 1   levothyroxine  (SYNTHROID ) 112 MCG tablet 1 tab p.o. on empty stomach 6 days a week, half a tab 1 day a week 86 tablet 3   losartan  (COZAAR ) 100 MG tablet TAKE 1 TABLET EVERY DAY 90 tablet 3   metFORMIN  (GLUCOPHAGE ) 1000 MG tablet Take 1 tablet (1,000 mg total) by mouth daily with breakfast. 90 tablet 1   mirtazapine  (REMERON ) 15 MG tablet TAKE 1 TABLET BY MOUTH EVERYDAY AT BEDTIME 90 tablet 1   nitroGLYCERIN  (NITROSTAT ) 0.4 MG SL tablet PLACE 1 TABLET UNDER THE TONGUE EVERY 5 (FIVE) MINUTES X 3 DOSES AS NEEDED FOR CHEST PAIN. 25 tablet 4   pantoprazole  (PROTONIX ) 20 MG tablet Take 1 tablet (20 mg total) by mouth daily. 90 tablet 1   rosuvastatin  (CRESTOR ) 20 MG tablet Take 1 tablet (20 mg total) by mouth  daily. 90 tablet 3   Semaglutide , 1 MG/DOSE, 4 MG/3ML SOPN Inject 1 mg into the skin once a week. 9 mL 1   spironolactone  (ALDACTONE ) 25 MG tablet Take 1 tablet (25 mg total) by mouth daily. 90 tablet 1   venlafaxine  XR (EFFEXOR  XR) 150 MG 24 hr capsule Take 1 capsule (150 mg total) by mouth daily with breakfast. 90 capsule 1   No facility-administered medications prior to visit.    PAST MEDICAL HISTORY: Past Medical History:  Diagnosis Date   Basal cell carcinoma    Brain bleed (HCC) 2017   COVID-19 08/2019   DM (diabetes mellitus) (HCC)    Former smoker  Glossitis, benign migratory 08/31/2019   Goiter, unspecified    Heart murmur    History of colonic polyps 2012   Multiple sclerosis (HCC)    Non-ST elevated myocardial infarction (non-STEMI) (HCC)    Olecranon bursitis of left elbow 10/17/2017   Oral mucositis 08/31/2019   SCCA (squamous cell carcinoma) of skin    right shin   SEMI (subendocardial myocardial infarction) (HCC) 09/29/2009   TOTAL RCA WITH STENT TO OM BRANCH   Shingles    Squamous cell carcinoma 2019   lower ext   Traumatic rupture of volar plate of left ring finger 03/25/2020   Unspecified essential hypertension    Unspecified hypothyroidism     PAST SURGICAL HISTORY: Past Surgical History:  Procedure Laterality Date   CATARACT SURGERY  07/2009   CESAREAN SECTION     CORONARY ANGIOPLASTY WITH STENT PLACEMENT  2011   HAND SURGERY Left 09/2021   tendon surgery   KNEE SURGERY     leg surgery     squamous ccell removed    TENOSYNOVECTOMY Left 10/26/2021   Procedure: LEFT WRIST EXTENSOR TENOSYNOVECTOMY;  Surgeon: Murrell Drivers, MD;  Location: [facility];  Service: Orthopedics;  Laterality: Left;  45 MIN   TONSILLECTOMY     VESICOVAGINAL FISTULA CLOSURE W/ TAH      FAMILY HISTORY: Family History  Problem Relation Age of Onset   Hypertension Mother    Heart failure Father    Diabetes Father    Aneurysm Brother        THORACIC/ABD  ANEURYSM   Prostate cancer Brother 68   Diabetes Brother    Fibromyalgia Sister    Brain cancer Maternal Grandmother 25   Multiple sclerosis Neg Hx    Colon cancer Neg Hx    Esophageal cancer Neg Hx     SOCIAL HISTORY: Social History   Socioeconomic History   Marital status: Married    Spouse name: Not on file   Number of children: 2   Years of education: 14   Highest education level: Associate degree: academic program  Occupational History   Occupation: Physiological scientist  Tobacco Use   Smoking status: Former    Current packs/day: 0.00    Average packs/day: 1 pack/day for 15.0 years (15.0 ttl pk-yrs)    Types: Cigarettes    Start date: 12/15/2000    Quit date: 12/16/2015    Years since quitting: 8.1   Smokeless tobacco: Never   Tobacco comments:    on and off smoker  Vaping Use   Vaping status: Never Used  Substance and Sexual Activity   Alcohol use: Yes    Alcohol/week: 0.0 standard drinks of alcohol    Comment: OCCASIONAL ALCOHOL USE.   Drug use: No   Sexual activity: Yes  Other Topics Concern   Not on file  Social History Narrative   The patient lives with husband. 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    Patient drinks occasionally, does not chew tobacco, does not use recreational drugs. She does drink occasional caffeine. She does wear her seatbelt. She does wear bike helmet riding a bike. She does exercise 3 times a week. She does not wear hearing aids or dentures. There is a smoke alarm in her home. There are no firearms in her home. She feels safe in her relationship. She has never experienced physical abuse.   Patient sleeps 7-8 hours a night.   Patient drinks 2 cups of caffeine daily.  Patient is right handed.   Social Drivers of Corporate investment banker Strain: Low Risk  (01/24/2024)   Overall Financial Resource Strain (CARDIA)    Difficulty of Paying Living Expenses: Not hard at all  Food Insecurity: No Food  Insecurity (01/24/2024)   Hunger Vital Sign    Worried About Running Out of Food in the Last Year: Never true    Ran Out of Food in the Last Year: Never true  Transportation Needs: No Transportation Needs (01/24/2024)   PRAPARE - Administrator, Civil Service (Medical): No    Lack of Transportation (Non-Medical): No  Physical Activity: Insufficiently Active (08/16/2023)   Exercise Vital Sign    Days of Exercise per Week: 2 days    Minutes of Exercise per Session: 10 min  Stress: No Stress Concern Present (08/16/2023)   [employer] of Occupational Health - Occupational Stress Questionnaire    Feeling of Stress : Not at all  Social Connections: Socially Integrated (01/24/2024)   Social Connection and Isolation Panel    Frequency of Communication with Friends and Family: Twice a week    Frequency of Social Gatherings with Friends and Family: Three times a week    Attends Religious Services: 1 to 4 times per year    Active Member of Clubs or Organizations: Yes    Attends Banker Meetings: 1 to 4 times per year    Marital Status: Married  Catering manager Violence: Not At Risk (12/29/2022)   Humiliation, Afraid, Rape, and Kick questionnaire    Fear of Current or Ex-Partner: No    Emotionally Abused: No    Physically Abused: No    Sexually Abused: No   PHYSICAL EXAM  Vitals:   02/08/24 1050  BP: 138/60  Pulse: (!) 59  SpO2: 98%  Weight: 160 lb 8 oz (72.8 kg)  Height: 5' 6 (1.676 m)    Body mass index is 25.91 kg/m.  Generalized: Well developed, in no acute distress  Neurological examination  Mentation: Alert oriented to time, place, history taking. Follows all commands speech and language fluent Cranial nerve II-XII: Pupils were equal round reactive to light. Extraocular movements were full, visual field were full on confrontational test. Facial sensation and strength were normal.  Head turning and shoulder shrug  were normal and symmetric. Motor:  The motor testing reveals 5 over 5 strength of all 4 extremities. Good symmetric motor tone is noted throughout.  Sensory: Sensory testing is intact to soft touch on all 4 extremities. No evidence of extinction is noted.  Coordination: Cerebellar testing reveals good finger-nose-finger and heel-to-shin bilaterally.  Gait and station: Gait is normal.  Reflexes: Deep tendon reflexes are symmetric and normal bilaterally.   DIAGNOSTIC DATA (LABS, IMAGING, TESTING) - I reviewed patient records, labs, notes, testing and imaging myself where available.  Lab Results  Component Value Date   WBC 7.0 12/02/2023   HGB 13.3 12/02/2023   HCT 40.4 12/02/2023   MCV 100.1 (H) 12/02/2023   PLT 143.0 (L) 12/02/2023      Component Value Date/Time   NA 140 12/02/2023 1021   NA 135 09/22/2020 1351   K 4.8 12/02/2023 1021   CL 104 12/02/2023 1021   CO2 28 12/02/2023 1021   GLUCOSE 168 (H) 12/02/2023 1021   BUN 18 12/02/2023 1021   BUN 17 09/22/2020 1351   CREATININE 1.42 (H) 12/02/2023 1021   CREATININE 1.31 (H) 05/20/2022 1339   CREATININE 1.27 (H) 04/28/2022 9183  CALCIUM  10.1 12/02/2023 1021   PROT 6.7 12/02/2023 1021   PROT 7.0 03/23/2021 1108   ALBUMIN 4.3 12/02/2023 1021   ALBUMIN 4.2 03/23/2021 1108   AST 17 12/02/2023 1021   AST 22 05/20/2022 1339   ALT 17 12/02/2023 1021   ALT 17 05/20/2022 1339   ALKPHOS 61 12/02/2023 1021   BILITOT 0.4 12/02/2023 1021   BILITOT 0.5 05/20/2022 1339   GFRNONAA 45 (L) 05/20/2022 1339   GFRAA 77 09/22/2020 1351   Lab Results  Component Value Date   CHOL 139 12/02/2023   HDL 39.20 12/02/2023   LDLCALC 40 12/02/2023   LDLDIRECT 90.0 09/28/2021   TRIG 298.0 (H) 12/02/2023   CHOLHDL 4 12/02/2023   Lab Results  Component Value Date   HGBA1C 7.1 (H) 12/02/2023   Lab Results  Component Value Date   VITAMINB12 1,140 (H) 12/02/2023   Lab Results  Component Value Date   TSH 0.10 (L) 12/02/2023   Lauraine Born, AGNP-C, DNP 02/08/2024, 11:17  [facility] [address] [place], [place] [address] [phone]

## 2024-02-08 NOTE — Patient Instructions (Signed)
 Great to see you today.  Call for any worsening symptoms.  Continue exercise, activity.  Follow-up in 1 year.  Thanks!!

## 2024-03-07 ENCOUNTER — Other Ambulatory Visit: Payer: Self-pay

## 2024-03-07 ENCOUNTER — Other Ambulatory Visit: Payer: Self-pay | Admitting: Family Medicine

## 2024-03-07 MED ORDER — GLIMEPIRIDE 4 MG PO TABS: 4.0000 mg | ORAL_TABLET | Freq: Every day | ORAL | 0 refills | Status: DC

## 2024-03-13 ENCOUNTER — Encounter: Payer: Self-pay | Admitting: Family Medicine

## 2024-03-13 ENCOUNTER — Ambulatory Visit (INDEPENDENT_AMBULATORY_CARE_PROVIDER_SITE_OTHER): Admitting: Family Medicine

## 2024-03-13 VITALS — BP 110/68 | HR 62 | Temp 97.9°F | Wt 159.4 lb

## 2024-03-13 DIAGNOSIS — G35 Multiple sclerosis: Secondary | ICD-10-CM

## 2024-03-13 DIAGNOSIS — E1169 Type 2 diabetes mellitus with other specified complication: Secondary | ICD-10-CM

## 2024-03-13 DIAGNOSIS — Z7985 Long-term (current) use of injectable non-insulin antidiabetic drugs: Secondary | ICD-10-CM

## 2024-03-13 DIAGNOSIS — D6869 Other thrombophilia: Secondary | ICD-10-CM | POA: Diagnosis not present

## 2024-03-13 DIAGNOSIS — I10 Essential (primary) hypertension: Secondary | ICD-10-CM | POA: Diagnosis not present

## 2024-03-13 DIAGNOSIS — E785 Hyperlipidemia, unspecified: Secondary | ICD-10-CM

## 2024-03-13 DIAGNOSIS — F330 Major depressive disorder, recurrent, mild: Secondary | ICD-10-CM | POA: Diagnosis not present

## 2024-03-13 DIAGNOSIS — S065XAA Traumatic subdural hemorrhage with loss of consciousness status unknown, initial encounter: Secondary | ICD-10-CM | POA: Diagnosis not present

## 2024-03-13 DIAGNOSIS — I25118 Atherosclerotic heart disease of native coronary artery with other forms of angina pectoris: Secondary | ICD-10-CM | POA: Diagnosis not present

## 2024-03-13 DIAGNOSIS — N1832 Chronic kidney disease, stage 3b: Secondary | ICD-10-CM

## 2024-03-13 DIAGNOSIS — Z7984 Long term (current) use of oral hypoglycemic drugs: Secondary | ICD-10-CM

## 2024-03-13 DIAGNOSIS — E063 Autoimmune thyroiditis: Secondary | ICD-10-CM

## 2024-03-13 LAB — POCT GLYCOSYLATED HEMOGLOBIN (HGB A1C)
HbA1c POC (<> result, manual entry): 6.5 % (ref 4.0–5.6)
HbA1c, POC (controlled diabetic range): 6.5 % (ref 0.0–7.0)
HbA1c, POC (prediabetic range): 6.5 % — AB (ref 5.7–6.4)
Hemoglobin A1C: 6.5 % — AB (ref 4.0–5.6)

## 2024-03-13 LAB — MICROALBUMIN / CREATININE URINE RATIO
Creatinine,U: 68.8 mg/dL
Microalb Creat Ratio: 33.2 mg/g — ABNORMAL HIGH (ref 0.0–30.0)
Microalb, Ur: 2.3 mg/dL — ABNORMAL HIGH (ref 0.0–1.9)

## 2024-03-13 LAB — T3, FREE: T3, Free: 2.7 pg/mL (ref 2.3–4.2)

## 2024-03-13 LAB — T4, FREE: Free T4: 1.12 ng/dL (ref 0.60–1.60)

## 2024-03-13 LAB — TSH: TSH: 0.46 u[IU]/mL (ref 0.35–5.50)

## 2024-03-13 MED ORDER — AMLODIPINE BESYLATE 10 MG PO TABS: 10.0000 mg | ORAL_TABLET | Freq: Every day | ORAL | 1 refills | Status: DC

## 2024-03-13 MED ORDER — PANTOPRAZOLE SODIUM 20 MG PO TBEC: 20.0000 mg | DELAYED_RELEASE_TABLET | Freq: Every day | ORAL | 1 refills | Status: DC

## 2024-03-13 MED ORDER — FENOFIBRATE 160 MG PO TABS: 160.0000 mg | ORAL_TABLET | Freq: Every day | ORAL | 3 refills | Status: DC

## 2024-03-13 MED ORDER — ROSUVASTATIN CALCIUM 20 MG PO TABS: 20.0000 mg | ORAL_TABLET | Freq: Every day | ORAL | 3 refills | Status: AC

## 2024-03-13 MED ORDER — CARVEDILOL 12.5 MG PO TABS: 12.5000 mg | ORAL_TABLET | Freq: Two times a day (BID) | ORAL | 1 refills | Status: DC

## 2024-03-13 MED ORDER — EMPAGLIFLOZIN 25 MG PO TABS: 25.0000 mg | ORAL_TABLET | Freq: Every day | ORAL | 1 refills | Status: DC

## 2024-03-13 MED ORDER — METFORMIN HCL 1000 MG PO TABS: 1000.0000 mg | ORAL_TABLET | Freq: Every day | ORAL | 1 refills | Status: DC

## 2024-03-13 MED ORDER — SPIRONOLACTONE 25 MG PO TABS: 25.0000 mg | ORAL_TABLET | Freq: Every day | ORAL | 1 refills | Status: DC

## 2024-03-13 MED ORDER — VENLAFAXINE HCL ER 150 MG PO CP24: 150.0000 mg | ORAL_CAPSULE | Freq: Every day | ORAL | 1 refills | Status: DC

## 2024-03-13 MED ORDER — SEMAGLUTIDE (1 MG/DOSE) 4 MG/3ML ~~LOC~~ SOPN: 1.0000 mg | PEN_INJECTOR | SUBCUTANEOUS | 1 refills | Status: DC

## 2024-03-13 MED ORDER — MIRTAZAPINE 15 MG PO TABS: ORAL_TABLET | ORAL | 1 refills | Status: DC

## 2024-03-13 NOTE — Patient Instructions (Addendum)
 Return in about 16 weeks (around 07/03/2024) for Routine chronic condition follow-up.        Great to see you today.  I have refilled the medication(s) we provide.   If labs were collected or images ordered, we will inform you of  results once we have received them and reviewed. We will contact you either by echart message, or telephone call.  Please give ample time to the testing facility, and our office to run,  receive and review results. Please do not call inquiring of results, even if you can see them in your chart. We will contact you as soon as we are able. If it has been over 1 week since the test was completed, and you have not yet heard from us , then please call us .    - echart message- for normal results that have been seen by the patient already.   - telephone call: abnormal results or if patient has not viewed results in their echart.  If a referral to a specialist was entered for you, please call us  in 2 weeks if you have not heard from the specialist office to schedule.  TS

## 2024-03-13 NOTE — Progress Notes (Signed)
 Daisy Bennett , 01/15/1956, 68 y.o., female MRN: [id number] Patient Care Team    Relationship Specialty Notifications Start End  Catherine Charlies LABOR, DO PCP - General Family Medicine  03/10/15   Delford Maude BROCKS, MD PCP - Cardiology Cardiology Admissions 05/31/18   Haverstock, Tawni CROME, MD Referring Physician Dermatology  02/10/16   Jenel Carlin POUR, MD (Inactive) Consulting Physician Neurology  05/18/16   Dannielle Bouchard, DO Consulting Physician Obstetrics and Gynecology  06/04/16   Mevelyn JONETTA Bathe, OD  Optometry  08/17/16     Chief Complaint  Patient presents with   Diabetes    Chronic Conditions/illness Management Pt is fasting.      Subjective: .Daisy Bennett is a 68 y.o. female present for Chronic Conditions/illness Management  Diabetes/morbid obesity: She reports compliance with Amaryl   4 mg bid daily and metformin  1000 mg qd.  Jardiance  25 mg and tolerating ozempic1mg  weekly Patient denies dizziness, hyperglycemic or hypoglycemic events. Patient denies numbness, tingling in the extremities or nonhealing wounds of feet.  .   Hypertension/hyperlipidemia: She reports compliance  with her medication regimen of spironolactone  25 mg daily, Crestor  20 mg daily, losartan  100 mg daily, fish oil 1000 mg daily, fenofibrate  160 mg daily Norvasc  10 mg, baby aspirin  daily, Coreg  12.5 mg twice daily .  H/O CAD, NSTEMI, post stent 2011 ([facility])-collateralized RCA.  She follows with Dr. Nishan.  Patient denies chest pain, shortness of breath, dizziness or lower extremity edema.   depression/MS/sleep disturbance:  Patient reports compliance  with Effexor  at 150 mg daily and she remeron  15 mg qhs .       01/25/2024   10:32 AM 12/02/2023   10:26 AM 08/19/2023   10:36 AM 05/18/2023    9:52 AM 12/29/2022    9:07 AM  Depression screen PHQ 2/9  Decreased Interest 0 0 0 0 0  Down, Depressed, Hopeless 0 0 0 0 0  PHQ - 2 Score 0 0 0 0 0  Altered sleeping 0 0  2   Tired, decreased energy 0 0  0    Change in appetite 2 0  0   Feeling bad or failure about yourself  0 0  0   Trouble concentrating 0 0  0   Moving slowly or fidgety/restless 0 0  0   Suicidal thoughts  0  0   PHQ-9 Score 2 0  2   Difficult doing work/chores Not difficult at all Not difficult at all  Not difficult at all     Allergies  Allergen Reactions   Aubagio [Teriflunomide] Diarrhea    Pancreatitis, neuropathy, elevated glucose   Betaseron [Interferon Beta-1b] Other (See Comments)    Increased LFT's   Provigil  [Modafinil ] Swelling    Tongue swelling and sores   Topamax  [Topiramate ]     Cognitive slowing   Doxycycline  Other (See Comments)    Thrush,dizziness   Social History   Social History Narrative   The patient lives with husband. 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    Patient drinks occasionally, does not chew tobacco, does not use recreational drugs. She does drink occasional caffeine. She does wear her seatbelt. She does wear bike helmet riding a bike. She does exercise 3 times a week. She does not wear hearing aids or dentures. There is a smoke alarm in her home. There are no firearms in her home. She feels safe in her relationship. She has never experienced physical abuse.  Patient sleeps 7-8 hours a night.   Patient drinks 2 cups of caffeine daily.   Patient is right handed.   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   Goiter, unspecified    Heart murmur    History of colonic polyps 2012   Multiple sclerosis (HCC)    Non-ST elevated myocardial infarction (non-STEMI) (HCC)    Olecranon bursitis of left elbow 10/17/2017   Oral mucositis 08/31/2019   SCCA (squamous cell carcinoma) of skin    right shin   SEMI (subendocardial myocardial infarction) (HCC) 09/29/2009   TOTAL RCA WITH STENT TO OM BRANCH    Shingles    Squamous cell carcinoma 2019   lower ext   Traumatic rupture of volar plate of left ring finger 03/25/2020   Unspecified essential hypertension    Unspecified hypothyroidism    Past Surgical History:  Procedure Laterality Date   CATARACT SURGERY  07/2009   CESAREAN SECTION     CORONARY ANGIOPLASTY WITH STENT PLACEMENT  2011   HAND SURGERY Left 09/2021   tendon surgery   KNEE SURGERY     leg surgery     squamous ccell removed    TENOSYNOVECTOMY Left 10/26/2021   Procedure: LEFT WRIST EXTENSOR TENOSYNOVECTOMY;  Surgeon: Murrell Drivers, MD;  Location: [facility];  Service: Orthopedics;  Laterality: Left;  45 MIN   TONSILLECTOMY     VESICOVAGINAL FISTULA CLOSURE W/ TAH     Family History  Problem Relation Age of Onset   Hypertension Mother    Heart failure Father    Diabetes Father    Aneurysm Brother        THORACIC/ABD ANEURYSM   Prostate cancer Brother 63   Diabetes Brother    Fibromyalgia Sister    Brain cancer Maternal Grandmother 5   Multiple sclerosis Neg Hx    Colon cancer Neg Hx    Esophageal cancer Neg Hx    Allergies as of 03/13/2024       Reactions   Aubagio [teriflunomide] Diarrhea   Pancreatitis, neuropathy, elevated glucose   Betaseron [interferon Beta-1b] Other (See Comments)   Increased LFT's   Provigil  [modafinil ] Swelling   Tongue swelling and sores   Topamax  [topiramate ]    Cognitive slowing   Doxycycline  Other (See Comments)   Thrush,dizziness        Medication List        Accurate as of March 13, 2024  9:45 AM. If you have any questions, ask your nurse or doctor.          amLODipine  10 MG tablet Commonly known as: NORVASC  Take 1 tablet (10 mg total) by mouth daily.   aspirin  EC 81 MG tablet Take 1 tablet (81 mg total) by mouth daily. Swallow whole.   B COMPLEX-B12 PO Take by mouth.   carvedilol  12.5 MG tablet Commonly known as: COREG  Take 1 tablet (12.5 mg total) by mouth 2 (two) times daily.    cholecalciferol  1000 units tablet Commonly known as: VITAMIN D  Take 2,000 Units by mouth daily.   empagliflozin  25 MG Tabs tablet Commonly known as: Jardiance  Take 1 tablet (25 mg total) by mouth daily before breakfast.   fenofibrate  160 MG tablet Take 1 tablet (160 mg total) by mouth daily.   fish oil-omega-3 fatty acids  1000 MG  capsule Take 1 g by mouth daily.   glimepiride  4 MG tablet Commonly known as: AMARYL  Take 1 tablet (4 mg total) by mouth daily with breakfast.   levothyroxine  112 MCG tablet Commonly known as: SYNTHROID  TAKE 1 TABLET EVERY DAY   losartan  100 MG tablet Commonly known as: COZAAR  TAKE 1 TABLET EVERY DAY   metFORMIN  1000 MG tablet Commonly known as: GLUCOPHAGE  Take 1 tablet (1,000 mg total) by mouth daily with breakfast.   mirtazapine  15 MG tablet Commonly known as: REMERON  TAKE 1 TABLET BY MOUTH EVERYDAY AT BEDTIME   nitroGLYCERIN  0.4 MG SL tablet Commonly known as: NITROSTAT  PLACE 1 TABLET UNDER THE TONGUE EVERY 5 (FIVE) MINUTES X 3 DOSES AS NEEDED FOR CHEST PAIN.   pantoprazole  20 MG tablet Commonly known as: PROTONIX  Take 1 tablet (20 mg total) by mouth daily.   rosuvastatin  20 MG tablet Commonly known as: CRESTOR  Take 1 tablet (20 mg total) by mouth daily.   Semaglutide  (1 MG/DOSE) 4 MG/3ML Sopn Inject 1 mg into the skin once a week.   spironolactone  25 MG tablet Commonly known as: ALDACTONE  Take 1 tablet (25 mg total) by mouth daily.   venlafaxine  XR 150 MG 24 hr capsule Commonly known as: Effexor  XR Take 1 capsule (150 mg total) by mouth daily with breakfast.        All past medical history, surgical history, allergies, family history, immunizations andmedications were updated in the EMR today and reviewed under the history and medication portions of their EMR.     ROS: Negative, with the exception of above mentioned in HPI   Objective:  BP 110/68   Pulse 62   Temp 97.9 F (36.6 C)   Wt 159 lb 6.4 oz (72.3 kg)    SpO2 97%   BMI 25.73 kg/m  Body mass index is 25.73 kg/m. Physical Exam Vitals and nursing note reviewed.  Constitutional:      General: She is not in acute distress.    Appearance: Normal appearance. She is not ill-appearing, toxic-appearing or diaphoretic.  HENT:     Head: Normocephalic and atraumatic.  Eyes:     General: No scleral icterus.       Right eye: No discharge.        Left eye: No discharge.     Extraocular Movements: Extraocular movements intact.     Conjunctiva/sclera: Conjunctivae normal.     Pupils: Pupils are equal, round, and reactive to light.  Cardiovascular:     Rate and Rhythm: Normal rate and regular rhythm.     Heart sounds: No murmur heard. Pulmonary:     Effort: Pulmonary effort is normal. No respiratory distress.     Breath sounds: Normal breath sounds. No wheezing, rhonchi or rales.  Musculoskeletal:     Cervical back: Neck supple.     Right lower leg: No edema.     Left lower leg: No edema.  Skin:    General: Skin is warm.     Findings: No rash.  Neurological:     Mental Status: She is alert and oriented to person, place, and time. Mental status is at baseline.     Motor: No weakness.     Gait: Gait normal.  Psychiatric:        Mood and Affect: Mood normal.        Behavior: Behavior normal.        Thought Content: Thought content normal.        Judgment: Judgment normal.  Diabetic Foot Exam - Simple   Simple Foot Form Diabetic Foot exam was performed with the following findings: Yes 03/13/2024  9:44 AM  Visual Inspection No deformities, no ulcerations, no other skin breakdown bilaterally: Yes Sensation Testing Intact to touch and monofilament testing bilaterally: Yes Pulse Check Posterior Tibialis and Dorsalis pulse intact bilaterally: Yes Comments     No results found. No results found. Results for orders placed or performed in visit on 03/13/24 (from the past 24 hours)  POCT HgB A1C     Status: Abnormal   Collection Time:  03/13/24  9:28 AM  Result Value Ref Range   Hemoglobin A1C 6.5 (A) 4.0 - 5.6 %   HbA1c POC (<> result, manual entry) 6.5 4.0 - 5.6 %   HbA1c, POC (prediabetic range) 6.5 (A) 5.7 - 6.4 %   HbA1c, POC (controlled diabetic range) 6.5 0.0 - 7.0 %     Assessment/Plan: Daisy Bennett is a 68 y.o. female present for OV for Chronic Conditions/illness Management Diabetes mellitus with complication (HCC)/Morbid obesity Stable Continue jardiance  25 mg qd Continue Amaryl  to 4 mg every day> if able will decrease or stop after results Continue metformin  1000 mg daily (decreased secondary to kidney function) Continue ozempic  to 1 mg weekly injection.  - increase exercise regimen and continue dietary modification> cut back on sugary drinks. - tapered off gabapentin  (w/ neuro assistance)  POCT HgB A1C--> 5.9> 6.1> 6.6> 8.6> 7.6 > 7.1>7.6> 7.1 > 8.0>7.4> 7.1 >6.5 collected today - prevnar 20 completed  -  foot exam completed 03/13/2024 - Eye exam: UTD 10/10/2023-  Dr. Gib [place], yearly eye exams. Requested. - Microalbumin:collected 12/20/2023-collected today-prior lab not appearing in system.   - flu shot -UTD 2024  Hypertension/hyperlipidemia/Antiplatelet or antithrombotic long-term use/athersclerosis/hypokalemia/s/p stent placement:  Stable - follows with cardiology, Dr. Delford - low salt, increase exercise.  Continue amlodipine  10 Continue losartan  100 Continue coreg   12.5 mg BID  Continue spiro 25 mg daily (can stop kdur 10. She already stopped hctz) Continue fenofibrate  Continue statin Continue baby aspirin .   - maintain routine cardiology follow ups.   Depression/sleep disturbance Stable Continue Effexor  150 mg daily Continue remeron  15 mg every day  Multiple sclerosis (HCC) Managed by neuro. -currently off all meds  GERD: Protonix  20 every day continue  - b12, vitd and mag UTD  CKD 3 Avoid NSAIDs when able Renally dose meds when appropriate Vitamin D  levels  UTD  hypothyroid TSH mildly oversupplemented last visit, decrease dose by half tab weekly. Continue levo 112 mcg 1 tab p.o. 6 days a week, half a tablet 1 day a week.. refills will be provided in appropriate dose based on lab result today TSH, T4 free, T3 free collected today.  Reviewed expectations re: course of current medical issues. Discussed self-management of symptoms. Outlined signs and symptoms indicating need for more acute intervention. Patient verbalized understanding and all questions were answered. Patient received an After-Visit Summary.    Orders Placed This Encounter  Procedures   Urine Microalbumin w/creat. ratio   TSH   T4, free   T3, free   POCT HgB A1C    Meds ordered this encounter  Medications   fenofibrate  160 MG tablet    Sig: Take 1 tablet (160 mg total) by mouth daily.    Dispense:  90 tablet    Refill:  3   rosuvastatin  (CRESTOR ) 20 MG tablet    Sig: Take 1 tablet (20 mg total) by mouth daily.    Dispense:  90 tablet    Refill:  3   amLODipine  (NORVASC ) 10 MG tablet    Sig: Take 1 tablet (10 mg total) by mouth daily.    Dispense:  90 tablet    Refill:  1   carvedilol  (COREG ) 12.5 MG tablet    Sig: Take 1 tablet (12.5 mg total) by mouth 2 (two) times daily.    Dispense:  180 tablet    Refill:  1   empagliflozin  (JARDIANCE ) 25 MG TABS tablet    Sig: Take 1 tablet (25 mg total) by mouth daily before breakfast.    Dispense:  90 tablet    Refill:  1   metFORMIN  (GLUCOPHAGE ) 1000 MG tablet    Sig: Take 1 tablet (1,000 mg total) by mouth daily with breakfast.    Dispense:  90 tablet    Refill:  1   mirtazapine  (REMERON ) 15 MG tablet    Sig: TAKE 1 TABLET BY MOUTH EVERYDAY AT BEDTIME    Dispense:  90 tablet    Refill:  1   pantoprazole  (PROTONIX ) 20 MG tablet    Sig: Take 1 tablet (20 mg total) by mouth daily.    Dispense:  90 tablet    Refill:  1   Semaglutide , 1 MG/DOSE, 4 MG/3ML SOPN    Sig: Inject 1 mg into the skin once a week.     Dispense:  9 mL    Refill:  1   spironolactone  (ALDACTONE ) 25 MG tablet    Sig: Take 1 tablet (25 mg total) by mouth daily.    Dispense:  90 tablet    Refill:  1   venlafaxine  XR (EFFEXOR  XR) 150 MG 24 hr capsule    Sig: Take 1 capsule (150 mg total) by mouth daily with breakfast.    Dispense:  90 capsule    Refill:  1     Referral Orders  No referral(s) requested today     Note is dictated utilizing voice recognition software. Although note has been proof read prior to signing, occasional typographical errors still can be missed. If any questions arise, please do not hesitate to call for verification.   electronically signed by:  Charlies Bellini, DO  [facility] [place]

## 2024-03-14 ENCOUNTER — Ambulatory Visit: Payer: Self-pay | Admitting: Family Medicine

## 2024-03-14 DIAGNOSIS — R809 Proteinuria, unspecified: Secondary | ICD-10-CM

## 2024-03-14 MED ORDER — LEVOTHYROXINE SODIUM 112 MCG PO TABS: ORAL_TABLET | ORAL | 3 refills | Status: AC

## 2024-03-16 ENCOUNTER — Other Ambulatory Visit: Payer: Self-pay | Admitting: Family Medicine

## 2024-03-16 ENCOUNTER — Ambulatory Visit: Admitting: Family Medicine

## 2024-03-28 ENCOUNTER — Other Ambulatory Visit: Payer: Self-pay | Admitting: Family Medicine

## 2024-04-19 ENCOUNTER — Other Ambulatory Visit: Payer: Self-pay | Admitting: Cardiovascular Disease

## 2024-05-03 DIAGNOSIS — M9903 Segmental and somatic dysfunction of lumbar region: Secondary | ICD-10-CM | POA: Diagnosis not present

## 2024-05-03 DIAGNOSIS — M9907 Segmental and somatic dysfunction of upper extremity: Secondary | ICD-10-CM | POA: Diagnosis not present

## 2024-05-03 DIAGNOSIS — M4726 Other spondylosis with radiculopathy, lumbar region: Secondary | ICD-10-CM | POA: Diagnosis not present

## 2024-05-03 DIAGNOSIS — S4351XA Sprain of right acromioclavicular joint, initial encounter: Secondary | ICD-10-CM | POA: Diagnosis not present

## 2024-05-03 DIAGNOSIS — M9904 Segmental and somatic dysfunction of sacral region: Secondary | ICD-10-CM | POA: Diagnosis not present

## 2024-05-03 DIAGNOSIS — M4728 Other spondylosis with radiculopathy, sacral and sacrococcygeal region: Secondary | ICD-10-CM | POA: Diagnosis not present

## 2024-05-07 DIAGNOSIS — M4728 Other spondylosis with radiculopathy, sacral and sacrococcygeal region: Secondary | ICD-10-CM | POA: Diagnosis not present

## 2024-05-07 DIAGNOSIS — M9907 Segmental and somatic dysfunction of upper extremity: Secondary | ICD-10-CM | POA: Diagnosis not present

## 2024-05-07 DIAGNOSIS — S4351XA Sprain of right acromioclavicular joint, initial encounter: Secondary | ICD-10-CM | POA: Diagnosis not present

## 2024-05-07 DIAGNOSIS — M9904 Segmental and somatic dysfunction of sacral region: Secondary | ICD-10-CM | POA: Diagnosis not present

## 2024-05-07 DIAGNOSIS — M4726 Other spondylosis with radiculopathy, lumbar region: Secondary | ICD-10-CM | POA: Diagnosis not present

## 2024-05-07 DIAGNOSIS — M9903 Segmental and somatic dysfunction of lumbar region: Secondary | ICD-10-CM | POA: Diagnosis not present

## 2024-05-08 DIAGNOSIS — S4351XA Sprain of right acromioclavicular joint, initial encounter: Secondary | ICD-10-CM | POA: Diagnosis not present

## 2024-05-08 DIAGNOSIS — M4726 Other spondylosis with radiculopathy, lumbar region: Secondary | ICD-10-CM | POA: Diagnosis not present

## 2024-05-08 DIAGNOSIS — M9904 Segmental and somatic dysfunction of sacral region: Secondary | ICD-10-CM | POA: Diagnosis not present

## 2024-05-08 DIAGNOSIS — M9907 Segmental and somatic dysfunction of upper extremity: Secondary | ICD-10-CM | POA: Diagnosis not present

## 2024-05-08 DIAGNOSIS — M4728 Other spondylosis with radiculopathy, sacral and sacrococcygeal region: Secondary | ICD-10-CM | POA: Diagnosis not present

## 2024-05-08 DIAGNOSIS — M9903 Segmental and somatic dysfunction of lumbar region: Secondary | ICD-10-CM | POA: Diagnosis not present

## 2024-05-09 DIAGNOSIS — M4726 Other spondylosis with radiculopathy, lumbar region: Secondary | ICD-10-CM | POA: Diagnosis not present

## 2024-05-09 DIAGNOSIS — M9903 Segmental and somatic dysfunction of lumbar region: Secondary | ICD-10-CM | POA: Diagnosis not present

## 2024-05-09 DIAGNOSIS — M4728 Other spondylosis with radiculopathy, sacral and sacrococcygeal region: Secondary | ICD-10-CM | POA: Diagnosis not present

## 2024-05-09 DIAGNOSIS — M9907 Segmental and somatic dysfunction of upper extremity: Secondary | ICD-10-CM | POA: Diagnosis not present

## 2024-05-09 DIAGNOSIS — S4351XA Sprain of right acromioclavicular joint, initial encounter: Secondary | ICD-10-CM | POA: Diagnosis not present

## 2024-05-09 DIAGNOSIS — M9904 Segmental and somatic dysfunction of sacral region: Secondary | ICD-10-CM | POA: Diagnosis not present

## 2024-05-10 DIAGNOSIS — S4351XA Sprain of right acromioclavicular joint, initial encounter: Secondary | ICD-10-CM | POA: Diagnosis not present

## 2024-05-10 DIAGNOSIS — M9904 Segmental and somatic dysfunction of sacral region: Secondary | ICD-10-CM | POA: Diagnosis not present

## 2024-05-10 DIAGNOSIS — M4728 Other spondylosis with radiculopathy, sacral and sacrococcygeal region: Secondary | ICD-10-CM | POA: Diagnosis not present

## 2024-05-10 DIAGNOSIS — M9903 Segmental and somatic dysfunction of lumbar region: Secondary | ICD-10-CM | POA: Diagnosis not present

## 2024-05-10 DIAGNOSIS — M9907 Segmental and somatic dysfunction of upper extremity: Secondary | ICD-10-CM | POA: Diagnosis not present

## 2024-05-10 DIAGNOSIS — M4726 Other spondylosis with radiculopathy, lumbar region: Secondary | ICD-10-CM | POA: Diagnosis not present

## 2024-05-12 ENCOUNTER — Other Ambulatory Visit: Payer: Self-pay | Admitting: Physician Assistant

## 2024-05-15 DIAGNOSIS — M9907 Segmental and somatic dysfunction of upper extremity: Secondary | ICD-10-CM | POA: Diagnosis not present

## 2024-05-15 DIAGNOSIS — M9903 Segmental and somatic dysfunction of lumbar region: Secondary | ICD-10-CM | POA: Diagnosis not present

## 2024-05-15 DIAGNOSIS — M4726 Other spondylosis with radiculopathy, lumbar region: Secondary | ICD-10-CM | POA: Diagnosis not present

## 2024-05-15 DIAGNOSIS — M9904 Segmental and somatic dysfunction of sacral region: Secondary | ICD-10-CM | POA: Diagnosis not present

## 2024-05-15 DIAGNOSIS — M4728 Other spondylosis with radiculopathy, sacral and sacrococcygeal region: Secondary | ICD-10-CM | POA: Diagnosis not present

## 2024-05-15 DIAGNOSIS — S4351XA Sprain of right acromioclavicular joint, initial encounter: Secondary | ICD-10-CM | POA: Diagnosis not present

## 2024-05-16 DIAGNOSIS — M4728 Other spondylosis with radiculopathy, sacral and sacrococcygeal region: Secondary | ICD-10-CM | POA: Diagnosis not present

## 2024-05-16 DIAGNOSIS — M4726 Other spondylosis with radiculopathy, lumbar region: Secondary | ICD-10-CM | POA: Diagnosis not present

## 2024-05-16 DIAGNOSIS — M9904 Segmental and somatic dysfunction of sacral region: Secondary | ICD-10-CM | POA: Diagnosis not present

## 2024-05-16 DIAGNOSIS — M9907 Segmental and somatic dysfunction of upper extremity: Secondary | ICD-10-CM | POA: Diagnosis not present

## 2024-05-16 DIAGNOSIS — S4351XA Sprain of right acromioclavicular joint, initial encounter: Secondary | ICD-10-CM | POA: Diagnosis not present

## 2024-05-16 DIAGNOSIS — M9903 Segmental and somatic dysfunction of lumbar region: Secondary | ICD-10-CM | POA: Diagnosis not present

## 2024-05-20 ENCOUNTER — Other Ambulatory Visit: Payer: Self-pay | Admitting: Family Medicine

## 2024-05-22 DIAGNOSIS — M9903 Segmental and somatic dysfunction of lumbar region: Secondary | ICD-10-CM | POA: Diagnosis not present

## 2024-05-22 DIAGNOSIS — S4351XA Sprain of right acromioclavicular joint, initial encounter: Secondary | ICD-10-CM | POA: Diagnosis not present

## 2024-05-22 DIAGNOSIS — M4728 Other spondylosis with radiculopathy, sacral and sacrococcygeal region: Secondary | ICD-10-CM | POA: Diagnosis not present

## 2024-05-22 DIAGNOSIS — M9904 Segmental and somatic dysfunction of sacral region: Secondary | ICD-10-CM | POA: Diagnosis not present

## 2024-05-22 DIAGNOSIS — M9907 Segmental and somatic dysfunction of upper extremity: Secondary | ICD-10-CM | POA: Diagnosis not present

## 2024-05-22 DIAGNOSIS — M4726 Other spondylosis with radiculopathy, lumbar region: Secondary | ICD-10-CM | POA: Diagnosis not present

## 2024-05-29 DIAGNOSIS — M9903 Segmental and somatic dysfunction of lumbar region: Secondary | ICD-10-CM | POA: Diagnosis not present

## 2024-05-29 DIAGNOSIS — M9904 Segmental and somatic dysfunction of sacral region: Secondary | ICD-10-CM | POA: Diagnosis not present

## 2024-05-29 DIAGNOSIS — M4728 Other spondylosis with radiculopathy, sacral and sacrococcygeal region: Secondary | ICD-10-CM | POA: Diagnosis not present

## 2024-05-29 DIAGNOSIS — M9907 Segmental and somatic dysfunction of upper extremity: Secondary | ICD-10-CM | POA: Diagnosis not present

## 2024-05-29 DIAGNOSIS — S4351XA Sprain of right acromioclavicular joint, initial encounter: Secondary | ICD-10-CM | POA: Diagnosis not present

## 2024-05-29 DIAGNOSIS — M4726 Other spondylosis with radiculopathy, lumbar region: Secondary | ICD-10-CM | POA: Diagnosis not present

## 2024-06-06 DIAGNOSIS — Z780 Asymptomatic menopausal state: Secondary | ICD-10-CM | POA: Diagnosis not present

## 2024-06-06 DIAGNOSIS — Z7985 Long-term (current) use of injectable non-insulin antidiabetic drugs: Secondary | ICD-10-CM | POA: Diagnosis not present

## 2024-06-06 DIAGNOSIS — E1122 Type 2 diabetes mellitus with diabetic chronic kidney disease: Secondary | ICD-10-CM | POA: Diagnosis not present

## 2024-06-06 DIAGNOSIS — E039 Hypothyroidism, unspecified: Secondary | ICD-10-CM | POA: Diagnosis not present

## 2024-06-06 DIAGNOSIS — D8481 Immunodeficiency due to conditions classified elsewhere: Secondary | ICD-10-CM | POA: Diagnosis not present

## 2024-06-06 DIAGNOSIS — K219 Gastro-esophageal reflux disease without esophagitis: Secondary | ICD-10-CM | POA: Diagnosis not present

## 2024-06-06 DIAGNOSIS — Z95820 Peripheral vascular angioplasty status with implants and grafts: Secondary | ICD-10-CM | POA: Diagnosis not present

## 2024-06-06 DIAGNOSIS — I129 Hypertensive chronic kidney disease with stage 1 through stage 4 chronic kidney disease, or unspecified chronic kidney disease: Secondary | ICD-10-CM | POA: Diagnosis not present

## 2024-06-06 DIAGNOSIS — G35D Multiple sclerosis, unspecified: Secondary | ICD-10-CM | POA: Diagnosis not present

## 2024-06-06 DIAGNOSIS — I252 Old myocardial infarction: Secondary | ICD-10-CM | POA: Diagnosis not present

## 2024-06-06 DIAGNOSIS — Z8249 Family history of ischemic heart disease and other diseases of the circulatory system: Secondary | ICD-10-CM | POA: Diagnosis not present

## 2024-06-06 DIAGNOSIS — N189 Chronic kidney disease, unspecified: Secondary | ICD-10-CM | POA: Diagnosis not present

## 2024-06-06 DIAGNOSIS — E785 Hyperlipidemia, unspecified: Secondary | ICD-10-CM | POA: Diagnosis not present

## 2024-06-06 DIAGNOSIS — Z79890 Hormone replacement therapy (postmenopausal): Secondary | ICD-10-CM | POA: Diagnosis not present

## 2024-06-06 DIAGNOSIS — I2510 Atherosclerotic heart disease of native coronary artery without angina pectoris: Secondary | ICD-10-CM | POA: Diagnosis not present

## 2024-06-06 DIAGNOSIS — G4700 Insomnia, unspecified: Secondary | ICD-10-CM | POA: Diagnosis not present

## 2024-07-03 ENCOUNTER — Ambulatory Visit: Admitting: Family Medicine

## 2024-07-03 ENCOUNTER — Encounter: Payer: Self-pay | Admitting: Family Medicine

## 2024-07-03 VITALS — BP 126/78 | HR 66 | Temp 97.8°F | Wt 164.6 lb

## 2024-07-03 DIAGNOSIS — I10 Essential (primary) hypertension: Secondary | ICD-10-CM

## 2024-07-03 DIAGNOSIS — F330 Major depressive disorder, recurrent, mild: Secondary | ICD-10-CM

## 2024-07-03 DIAGNOSIS — D6869 Other thrombophilia: Secondary | ICD-10-CM

## 2024-07-03 DIAGNOSIS — N1832 Chronic kidney disease, stage 3b: Secondary | ICD-10-CM

## 2024-07-03 DIAGNOSIS — R809 Proteinuria, unspecified: Secondary | ICD-10-CM | POA: Insufficient documentation

## 2024-07-03 DIAGNOSIS — I25118 Atherosclerotic heart disease of native coronary artery with other forms of angina pectoris: Secondary | ICD-10-CM

## 2024-07-03 DIAGNOSIS — Z9181 History of falling: Secondary | ICD-10-CM | POA: Insufficient documentation

## 2024-07-03 DIAGNOSIS — Z23 Encounter for immunization: Secondary | ICD-10-CM

## 2024-07-03 DIAGNOSIS — E1169 Type 2 diabetes mellitus with other specified complication: Secondary | ICD-10-CM

## 2024-07-03 DIAGNOSIS — E063 Autoimmune thyroiditis: Secondary | ICD-10-CM

## 2024-07-03 DIAGNOSIS — G35D Multiple sclerosis, unspecified: Secondary | ICD-10-CM

## 2024-07-03 DIAGNOSIS — G479 Sleep disorder, unspecified: Secondary | ICD-10-CM

## 2024-07-03 LAB — BASIC METABOLIC PANEL WITH GFR
BUN: 31 mg/dL — ABNORMAL HIGH (ref 6–23)
CO2: 29 meq/L (ref 19–32)
Calcium: 10 mg/dL (ref 8.4–10.5)
Chloride: 103 meq/L (ref 96–112)
Creatinine, Ser: 1.71 mg/dL — ABNORMAL HIGH (ref 0.40–1.20)
GFR: 30.07 mL/min — ABNORMAL LOW (ref >60.00)
Glucose, Bld: 103 mg/dL — ABNORMAL HIGH (ref 70–99)
Potassium: 4.9 meq/L (ref 3.5–5.1)
Sodium: 138 meq/L (ref 135–145)

## 2024-07-03 LAB — CBC WITH DIFFERENTIAL/PLATELET
Basophils Absolute: 0 K/uL (ref 0.0–0.1)
Basophils Relative: 0.6 % (ref 0.0–3.0)
Eosinophils Absolute: 0.3 K/uL (ref 0.0–0.7)
Eosinophils Relative: 3.3 % (ref 0.0–5.0)
HCT: 35 % — ABNORMAL LOW (ref 36.0–46.0)
Hemoglobin: 11.8 g/dL — ABNORMAL LOW (ref 12.0–15.0)
Lymphocytes Relative: 10.2 % — ABNORMAL LOW (ref 12.0–46.0)
Lymphs Abs: 0.8 K/uL (ref 0.7–4.0)
MCHC: 33.8 g/dL (ref 30.0–36.0)
MCV: 97.2 fl (ref 78.0–100.0)
Monocytes Absolute: 0.8 K/uL (ref 0.1–1.0)
Monocytes Relative: 9.7 % (ref 3.0–12.0)
Neutro Abs: 5.9 K/uL (ref 1.4–7.7)
Neutrophils Relative %: 76.2 % (ref 43.0–77.0)
Platelets: 145 K/uL — ABNORMAL LOW (ref 150.0–400.0)
RBC: 3.6 Mil/uL — ABNORMAL LOW (ref 3.87–5.11)
RDW: 12.6 % (ref 11.5–15.5)
WBC: 7.7 K/uL (ref 4.0–10.5)

## 2024-07-03 LAB — POCT GLYCOSYLATED HEMOGLOBIN (HGB A1C)
HbA1c POC (<> result, manual entry): 6.6 % (ref 4.0–5.6)
HbA1c, POC (controlled diabetic range): 6.6 % (ref 0.0–7.0)
HbA1c, POC (prediabetic range): 6.6 % — AB (ref 5.7–6.4)
Hemoglobin A1C: 6.6 % — AB (ref 4.0–5.6)

## 2024-07-03 MED ORDER — SEMAGLUTIDE (1 MG/DOSE) 4 MG/3ML ~~LOC~~ SOPN: 1.0000 mg | PEN_INJECTOR | SUBCUTANEOUS | 1 refills | Status: AC

## 2024-07-03 MED ORDER — PANTOPRAZOLE SODIUM 20 MG PO TBEC: 20.0000 mg | DELAYED_RELEASE_TABLET | Freq: Every day | ORAL | 1 refills | Status: AC

## 2024-07-03 MED ORDER — MIRTAZAPINE 15 MG PO TABS: ORAL_TABLET | ORAL | 1 refills | Status: AC

## 2024-07-03 MED ORDER — CARVEDILOL 12.5 MG PO TABS: 12.5000 mg | ORAL_TABLET | Freq: Two times a day (BID) | ORAL | 1 refills | Status: AC

## 2024-07-03 MED ORDER — VENLAFAXINE HCL ER 150 MG PO CP24: 150.0000 mg | ORAL_CAPSULE | Freq: Every day | ORAL | 1 refills | Status: AC

## 2024-07-03 MED ORDER — AMLODIPINE BESYLATE 10 MG PO TABS: 10.0000 mg | ORAL_TABLET | Freq: Every day | ORAL | 1 refills | Status: AC

## 2024-07-03 MED ORDER — FENOFIBRATE 160 MG PO TABS: 160.0000 mg | ORAL_TABLET | Freq: Every day | ORAL | 3 refills | Status: AC

## 2024-07-03 MED ORDER — LOSARTAN POTASSIUM 100 MG PO TABS: 100.0000 mg | ORAL_TABLET | Freq: Every day | ORAL | 1 refills | Status: AC

## 2024-07-03 MED ORDER — EMPAGLIFLOZIN 25 MG PO TABS: 25.0000 mg | ORAL_TABLET | Freq: Every day | ORAL | 1 refills | Status: AC

## 2024-07-03 MED ORDER — SPIRONOLACTONE 25 MG PO TABS: 25.0000 mg | ORAL_TABLET | Freq: Every day | ORAL | 1 refills | Status: AC

## 2024-07-03 MED ORDER — GLIMEPIRIDE 4 MG PO TABS: 4.0000 mg | ORAL_TABLET | Freq: Every day | ORAL | 1 refills | Status: AC

## 2024-07-03 NOTE — Patient Instructions (Signed)

## 2024-07-03 NOTE — Progress Notes (Signed)
 Daisy Bennett , 1955-12-11, 68 y.o., female MRN: [id number] Patient Care Team    Relationship Specialty Notifications Start End  Catherine Charlies LABOR, DO PCP - General Family Medicine  03/10/15   Delford Maude BROCKS, MD PCP - Cardiology Cardiology Admissions 05/31/18   Haverstock, Tawni CROME, MD Referring Physician Dermatology  02/10/16   Jenel Carlin POUR, MD (Inactive) Consulting Physician Neurology  05/18/16   Dannielle Bouchard, DO Consulting Physician Obstetrics and Gynecology  06/04/16   Mevelyn JONETTA Bathe, OD  Optometry  08/17/16     Chief Complaint  Patient presents with   Diabetes    Influenza vaccine- given Pt is fasting.      Subjective: .Daisy Bennett is a 68 y.o. female present for Chronic Conditions/illness Management  Diabetes/morbid obesity: She reports compliance with Amaryl   4 mg bid daily and metformin  1000 mg qd.  Jardiance  25 mg and tolerating ozempic1mg  weekly Patient denies dizziness, hyperglycemic or hypoglycemic events. Patient denies numbness, tingling in the extremities or nonhealing wounds of feet.   Hypertension/hyperlipidemia: She reports compliance with her medication regimen of spironolactone  25 mg daily, Crestor  20 mg daily, losartan  100 mg daily, fish oil 1000 mg daily, fenofibrate  160 mg daily Norvasc  10 mg, baby aspirin  daily, Coreg  12.5 mg twice daily .  H/O CAD, NSTEMI, post stent 2011 ([facility])-collateralized RCA.  She follows with Dr. Nishan.  Patient denies chest pain, shortness of breath, dizziness or lower extremity edema.   depression/MS/sleep disturbance:  Patient reports compliance with Effexor  at 150 mg daily and she remeron  15 mg qhs .  She feels medication regimen is working well for her.       07/03/2024    8:55 AM 01/25/2024   10:32 AM 12/02/2023   10:26 AM 08/19/2023   10:36 AM 05/18/2023    9:52 AM  Depression screen PHQ 2/9  Decreased Interest 0 0 0 0 0  Down, Depressed, Hopeless 0 0 0 0 0  PHQ - 2 Score 0 0 0 0 0  Altered sleeping 1 0 0   2  Tired, decreased energy 0 0 0  0  Change in appetite 0 2 0  0  Feeling bad or failure about yourself  1 0 0  0  Trouble concentrating 0 0 0  0  Moving slowly or fidgety/restless 0 0 0  0  Suicidal thoughts 0  0  0  PHQ-9 Score 2 2  0   2   Difficult doing work/chores Not difficult at all Not difficult at all Not difficult at all  Not difficult at all     Data saved with a previous flowsheet row definition      08/19/2023   10:36 AM 12/02/2023   10:26 AM 01/24/2024    2:16 PM 01/25/2024   10:30 AM 07/03/2024    8:55 AM  Fall Risk  Falls in the past year? 0 0 0 0 1  Was there an injury with Fall? 0  0   0  1  Fall Risk Category Calculator 0 0  0 3  Patient at Risk for Falls Due to No Fall Risks No Fall Risks     Fall risk Follow up Falls evaluation completed Falls evaluation completed  Falls evaluation completed;Education provided;Falls prevention discussed Falls evaluation completed;Education provided;Falls prevention discussed     Data saved with a previous flowsheet row definition     Allergies  Allergen Reactions   Aubagio [Teriflunomide] Diarrhea    Pancreatitis, neuropathy,  elevated glucose   Betaseron [Interferon Beta-1b] Other (See Comments)    Increased LFT's   Provigil  [Modafinil ] Swelling    Tongue swelling and sores   Topamax  [Topiramate ]     Cognitive slowing   Doxycycline  Other (See Comments)    Thrush,dizziness   Social History   Social History Narrative   The patient lives with husband. 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    Patient drinks occasionally, does not chew tobacco, does not use recreational drugs. She does drink occasional caffeine. She does wear her seatbelt. She does wear bike helmet riding a bike. She does exercise 3 times a week. She does not wear hearing aids or dentures. There is a smoke alarm in her home. There are no firearms in her home. She feels safe in her relationship. She has never experienced physical abuse.    Patient sleeps 7-8 hours a night.   Patient drinks 2 cups of caffeine daily.   Patient is right handed.   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   Goiter, unspecified    Heart murmur    History of colonic polyps 2012   Multiple sclerosis    Non-ST elevated myocardial infarction (non-STEMI) (HCC)    Olecranon bursitis of left elbow 10/17/2017   Oral mucositis 08/31/2019   SCCA (squamous cell carcinoma) of skin    right shin   SEMI (subendocardial myocardial infarction) (HCC) 09/29/2009   TOTAL RCA WITH STENT TO OM BRANCH   Shingles    Squamous cell carcinoma 2019   lower ext   Traumatic rupture of volar plate of left ring finger 03/25/2020   Unspecified essential hypertension    Unspecified hypothyroidism    Past Surgical History:  Procedure Laterality Date   CATARACT SURGERY  07/2009   CESAREAN SECTION     CORONARY ANGIOPLASTY WITH STENT PLACEMENT  2011   HAND SURGERY Left 09/2021   tendon surgery   KNEE SURGERY     leg surgery     squamous ccell removed    TENOSYNOVECTOMY Left 10/26/2021   Procedure: LEFT WRIST EXTENSOR TENOSYNOVECTOMY;  Surgeon: Murrell Drivers, MD;  Location: [facility];  Service: Orthopedics;  Laterality: Left;  45 MIN   TONSILLECTOMY     VESICOVAGINAL FISTULA CLOSURE W/ TAH     Family History  Problem Relation Age of Onset   Hypertension Mother    Heart failure Father    Diabetes Father    Aneurysm Brother        THORACIC/ABD ANEURYSM   Prostate cancer Brother 35   Diabetes Brother    Fibromyalgia Sister    Brain cancer Maternal Grandmother 39   Multiple sclerosis Neg Hx    Colon cancer Neg Hx    Esophageal cancer Neg Hx    Allergies as of 07/03/2024       Reactions   Aubagio [teriflunomide] Diarrhea   Pancreatitis, neuropathy, elevated glucose   Betaseron  [interferon Beta-1b] Other (See Comments)   Increased LFT's   Provigil  [modafinil ] Swelling   Tongue swelling and sores   Topamax  [topiramate ]    Cognitive slowing   Doxycycline  Other (See Comments)   Thrush,dizziness        Medication List        Accurate as of July 03, 2024  9:16 AM. If you have any questions, ask your nurse or doctor.          STOP taking these medications    metFORMIN  1000 MG tablet Commonly known as: GLUCOPHAGE  Stopped by: Charlies Bellini       TAKE these medications    amLODipine  10 MG tablet Commonly known as: NORVASC  Take 1 tablet (10 mg total) by mouth daily.   aspirin  EC 81 MG tablet Take 1 tablet (81 mg total) by mouth daily. Swallow whole.   B COMPLEX-B12 PO Take by mouth.   carvedilol  12.5 MG tablet Commonly known as: COREG  Take 1 tablet (12.5 mg total) by mouth 2 (two) times daily.   cholecalciferol  1000 units tablet Commonly known as: VITAMIN D  Take 2,000 Units by mouth daily.   empagliflozin  25 MG Tabs tablet Commonly known as: Jardiance  Take 1 tablet (25 mg total) by mouth daily before breakfast.   fenofibrate  160 MG tablet Take 1 tablet (160 mg total) by mouth daily.   fish oil-omega-3 fatty acids  1000 MG capsule Take 1 g by mouth daily.   glimepiride  4 MG tablet Commonly known as: AMARYL  Take 1 tablet (4 mg total) by mouth daily with breakfast. What changed: See the new instructions. Changed by: Charlies Bellini   levothyroxine  112 MCG tablet Commonly known as: SYNTHROID  1 tab p.o. daily for 6 days a week, half a tab p.o. 1 day a week   losartan  100 MG tablet Commonly known as: COZAAR  Take 1 tablet (100 mg total) by mouth daily.   mirtazapine  15 MG tablet Commonly known as: REMERON  TAKE 1 TABLET BY MOUTH EVERYDAY AT BEDTIME   nitroGLYCERIN  0.4 MG SL tablet Commonly known as: NITROSTAT  PLACE 1 TABLET UNDER THE TONGUE EVERY 5 (FIVE) MINUTES X 3 DOSES AS NEEDED FOR CHEST PAIN.   pantoprazole  20 MG  tablet Commonly known as: PROTONIX  Take 1 tablet (20 mg total) by mouth daily.   rosuvastatin  20 MG tablet Commonly known as: CRESTOR  Take 1 tablet (20 mg total) by mouth daily.   Semaglutide  (1 MG/DOSE) 4 MG/3ML Sopn Inject 1 mg into the skin once a week.   spironolactone  25 MG tablet Commonly known as: ALDACTONE  Take 1 tablet (25 mg total) by mouth daily.   venlafaxine  XR 150 MG 24 hr capsule Commonly known as: Effexor  XR Take 1 capsule (150 mg total) by mouth daily with breakfast.        All past medical history, surgical history, allergies, family history, immunizations andmedications were updated in the EMR today and reviewed under the history and medication portions of their EMR.     ROS: Negative, with the exception of above mentioned in HPI   Objective:  BP 126/78   Pulse 66   Temp 97.8 F (36.6 C)   Wt 164 lb 9.6 oz (74.7 kg)   SpO2 97%   BMI 26.57 kg/m  Body mass index is 26.57 kg/m. Physical Exam Vitals and nursing note reviewed.  Constitutional:      General: She is not in acute distress.    Appearance: Normal appearance. She is not ill-appearing, toxic-appearing or diaphoretic.  HENT:     Head: Normocephalic and atraumatic.  Eyes:     General: No scleral icterus.       Right eye: No discharge.        Left eye: No discharge.     Extraocular Movements: Extraocular movements intact.     Conjunctiva/sclera: Conjunctivae normal.     Pupils: Pupils  are equal, round, and reactive to light.  Cardiovascular:     Rate and Rhythm: Normal rate and regular rhythm.     Heart sounds: No murmur heard. Pulmonary:     Effort: Pulmonary effort is normal. No respiratory distress.     Breath sounds: Normal breath sounds. No wheezing, rhonchi or rales.  Musculoskeletal:     Cervical back: Neck supple.     Right lower leg: No edema.     Left lower leg: No edema.  Skin:    General: Skin is warm.     Findings: No rash.  Neurological:     Mental Status: She is  alert and oriented to person, place, and time. Mental status is at baseline.     Cranial Nerves: No cranial nerve deficit.     Sensory: Sensation is intact. No sensory deficit.     Motor: No weakness or abnormal muscle tone.     Gait: Gait normal.  Psychiatric:        Mood and Affect: Mood normal.        Behavior: Behavior normal.        Thought Content: Thought content normal.        Judgment: Judgment normal.     Results for orders placed or performed in visit on 07/03/24 (from the past 48 hours)  POCT HgB A1C     Status: Abnormal   Collection Time: 07/03/24  9:02 AM  Result Value Ref Range   Hemoglobin A1C 6.6 (A) 4.0 - 5.6 %   HbA1c POC (<> result, manual entry) 6.6 4.0 - 5.6 %   HbA1c, POC (prediabetic range) 6.6 (A) 5.7 - 6.4 %   HbA1c, POC (controlled diabetic range) 6.6 0.0 - 7.0 %     Assessment/Plan: Daisy Bennett is a 68 y.o. female present for OV for Chronic Conditions/illness Management Diabetes mellitus with complication (HCC)/microalbuminuria Stable Continue jardiance  25 mg qd Continue Amaryl  to 4 mg every day (may increase next visit if needed) Dc metformin  d/t kidney fx Continue ozempic  to 1 mg weekly injection (max renal dose). - increase exercise regimen and continue dietary modification> cut back on sugary drinks. - tapered off gabapentin  (w/ neuro assistance)  POCT HgB A1C--> 5.9> 6.1> 6.6> 8.6> 7.6 > 7.1>7.6> 7.1 > 8.0>7.4> 7.1 >6.5>6.6 collected today - prevnar 20 completed  -  foot exam completed 03/13/2024 - Eye exam: UTD 10/10/2023-  Dr. Gib [place], yearly eye exams. Requested. - Microalbumin:collected 12/20/2023> +microalb> repeated today - flu shot -completed today  Hypertension/hyperlipidemia/Antiplatelet or antithrombotic long-term use/athersclerosis/hypokalemia/s/p stent placement:  Stable - follows with cardiology, Dr. Delford - low salt, increase exercise.  Continue amlodipine  10 Continue losartan  100 Continue coreg   12.5 mg BID   Continue spiro 25 mg daily (can stop kdur 10. She already stopped hctz) Continue fenofibrate  Continue Crestor  20 mg nightly  Continue baby aspirin .   - maintain routine cardiology follow ups.   Depression/sleep disturbance Stable Continue Effexor  150 mg daily Continue remeron  15 mg every day  Multiple sclerosis (HCC) Managed by neuro. -currently off all meds  GERD: Stable Continue Protonix  20 every day  - b12, vitd and mag UTD up-to-date 12/2023  CKD 3 Avoid NSAIDs when able Renally dose meds when appropriate Vitamin D  levels up-to-date 12/2023  hypothyroid Stable Continue levo 112 mcg 1 tab p.o. 6 days a week, half a tablet 1 day a week..  Labs up-to-date 03/2024  At risk of injury due to fall: She has had 3 falls this year.  1 fall was mechanical.   Falls were secondary to left foot numbness after sitting for longer to the time.  She reports today its only happened a few times over 4.1 when sitting for both times when she goes to step on it causes her to fall.  There was no injuries with either fall.  She is wearing a certain pair shoes with normal state band on each of those appliances.  She reports to me she gets up and moves the feeling returns rather quickly. I do not believe this is secondary to her MS, which was her concern.  The numbness she describes is more of a tingling sensation but a heavy sensation.  I suspect the elastic on the shoulder in question may be causing her symptoms since it occurs with that shoe, when sitting, instantly returns when she gets up and moves and it is only affecting the left forefoot only when it does occur. She will wear different pair shoes and monitor for any reoccurrence.  If symptoms become worse if she has another fall would recommend we follow-up at that time for further investigation. Exam today of the left foot is normal with normal sensation  Health maintenance Influenza vaccine completed today.  Reviewed expectations re: course  of current medical issues. Discussed self-management of symptoms. Outlined signs and symptoms indicating need for more acute intervention. Patient verbalized understanding and all questions were answered. Patient received an After-Visit Summary.    Orders Placed This Encounter  Procedures   Flu vaccine HIGH DOSE PF(Fluzone Trivalent)   Urine Microalbumin w/creat. ratio   Basic Metabolic Panel (BMET)   CBC w/Diff   POCT HgB A1C    Meds ordered this encounter  Medications   glimepiride  (AMARYL ) 4 MG tablet    Sig: Take 1 tablet (4 mg total) by mouth daily with breakfast.    Dispense:  90 tablet    Refill:  1   losartan  (COZAAR ) 100 MG tablet    Sig: Take 1 tablet (100 mg total) by mouth daily.    Dispense:  90 tablet    Refill:  1   amLODipine  (NORVASC ) 10 MG tablet    Sig: Take 1 tablet (10 mg total) by mouth daily.    Dispense:  90 tablet    Refill:  1   carvedilol  (COREG ) 12.5 MG tablet    Sig: Take 1 tablet (12.5 mg total) by mouth 2 (two) times daily.    Dispense:  180 tablet    Refill:  1   empagliflozin  (JARDIANCE ) 25 MG TABS tablet    Sig: Take 1 tablet (25 mg total) by mouth daily before breakfast.    Dispense:  90 tablet    Refill:  1   mirtazapine  (REMERON ) 15 MG tablet    Sig: TAKE 1 TABLET BY MOUTH EVERYDAY AT BEDTIME    Dispense:  90 tablet    Refill:  1   pantoprazole  (PROTONIX ) 20 MG tablet    Sig: Take 1 tablet (20 mg total) by mouth daily.    Dispense:  90 tablet    Refill:  1   Semaglutide , 1 MG/DOSE, 4 MG/3ML SOPN    Sig: Inject 1 mg into the skin once a week.    Dispense:  9 mL    Refill:  1   spironolactone  (ALDACTONE ) 25 MG tablet    Sig: Take 1 tablet (25 mg total) by mouth daily.    Dispense:  90 tablet    Refill:  1  venlafaxine  XR (EFFEXOR  XR) 150 MG 24 hr capsule    Sig: Take 1 capsule (150 mg total) by mouth daily with breakfast.    Dispense:  90 capsule    Refill:  1   fenofibrate  160 MG tablet    Sig: Take 1 tablet (160 mg  total) by mouth daily.    Dispense:  90 tablet    Refill:  3     Referral Orders  No referral(s) requested today     Note is dictated utilizing voice recognition software. Although note has been proof read prior to signing, occasional typographical errors still can be missed. If any questions arise, please do not hesitate to call for verification.   electronically signed by:  Charlies Bellini, DO   [facility] [place]

## 2024-07-04 LAB — MICROALBUMIN / CREATININE URINE RATIO
Creatinine,U: 129.2 mg/dL
Microalb Creat Ratio: 32.6 mg/g — ABNORMAL HIGH (ref 0.0–30.0)
Microalb, Ur: 4.2 mg/dL — ABNORMAL HIGH (ref 0.0–1.9)

## 2024-07-05 ENCOUNTER — Ambulatory Visit: Payer: Self-pay | Admitting: Family Medicine

## 2024-07-05 DIAGNOSIS — N1832 Chronic kidney disease, stage 3b: Secondary | ICD-10-CM

## 2024-07-05 NOTE — Telephone Encounter (Signed)
 Please call patient Blood cell counts are stable The protein levels in your urine are mildly improved, still just a little bit above normal. Your kidney function decreased a fraction.  I recommend you hydrate very well, at least 60 ounces a day of water/low sugar electrolyte replacement and we repeat these labs in 2 weeks by lab appointment only.  Again make sure you are very well hydrated for this labs so we can see if the declining kidney function is accurate  Please make patient a lab appointment only in 2 weeks.

## 2024-07-19 ENCOUNTER — Other Ambulatory Visit

## 2024-07-19 DIAGNOSIS — N1832 Chronic kidney disease, stage 3b: Secondary | ICD-10-CM | POA: Diagnosis not present

## 2024-07-19 LAB — BASIC METABOLIC PANEL WITH GFR
BUN: 28 mg/dL — ABNORMAL HIGH (ref 6–23)
CO2: 25 meq/L (ref 19–32)
Calcium: 10.2 mg/dL (ref 8.4–10.5)
Chloride: 98 meq/L (ref 96–112)
Creatinine, Ser: 1.51 mg/dL — ABNORMAL HIGH (ref 0.40–1.20)
GFR: 34.9 mL/min — ABNORMAL LOW (ref >60.00)
Glucose, Bld: 169 mg/dL — ABNORMAL HIGH (ref 70–99)
Potassium: 4.2 meq/L (ref 3.5–5.1)
Sodium: 135 meq/L (ref 135–145)

## 2024-07-23 ENCOUNTER — Ambulatory Visit: Payer: Self-pay | Admitting: Family Medicine

## 2024-07-24 NOTE — Progress Notes (Signed)
 CARDIOLOGY CONSULT NOTE       Patient ID: Daisy Bennett MRN: [id number] DOB/AGE: [birth year] 70 y.o.  Referring Physician: [id number] Primary Physician: Catherine Charlies LABOR, DO Primary Cardiologist: Re establish Bladimir Auman Reason for Consultation: CAD   HPI:  68 y.o. I have not seen in over 3 years. History of CAD  post stenting 2011  She had a non ST elevation MI with subsequent stenting of an OM. She has diffuse 3VD with a total right that is collateralized. She had smoewhat atypical presentation with her DM. SABRALast myovue done 06/16/18 low risk non ischemic EF 68%   October 2017:  Fell and hit head with subdural Memory issues since then. 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   Unfortunately with stress is smoking a bit again   More issues with anxiety on Effexor  and Remeron  started by Dr Cristino 10/28/20 He long term amytriptylline was making her mouth dry    Has had elevated [place] chains with no M spike mild anemia and elevated Cr as high as 1.85 being w/u by primary Most recent Cr 1.51 and Hct 35 December 2025.    Has 4 grand children and two daughters living in [place] to camp with her husband in trailer home. Has 2 in [place] by river and going to [place]  mid January  Denies chest pain Has had some dyspnea and fatigue with rare palpitations at night   ROS All other systems reviewed and negative except as noted above  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   Goiter, unspecified    Heart murmur    History of colonic polyps 2012   History of subdural hematoma 05/18/2016   Multiple sclerosis    Non-ST elevated myocardial infarction (non-STEMI) (HCC)    Olecranon bursitis of left elbow 10/17/2017   Oral mucositis 08/31/2019   SCCA  (squamous cell carcinoma) of skin    right shin   SEMI (subendocardial myocardial infarction) (HCC) 09/29/2009   TOTAL RCA WITH STENT TO OM BRANCH   Shingles    Squamous cell cancer of skin of right forearm 09/28/2021   Squamous cell carcinoma 2019   lower ext   Traumatic rupture of volar plate of left ring finger 03/25/2020   Unspecified essential hypertension    Unspecified hypothyroidism     Family History  Problem Relation Age of Onset   Hypertension Mother    Heart failure Father    Diabetes Father    Aneurysm Brother        THORACIC/ABD ANEURYSM   Prostate cancer Brother 77   Diabetes Brother    Fibromyalgia Sister    Brain cancer Maternal Grandmother 76   Multiple sclerosis Neg Hx    Colon cancer Neg Hx    Esophageal cancer Neg Hx     Social History   Socioeconomic History   Marital status: Married    Spouse name: Not on file   Number of children: 2   Years of education: 14   Highest education level: Associate degree: academic program  Occupational History   Occupation: Physiological Scientist  Tobacco Use   Smoking status: Former    Current packs/day: 0.00    Average packs/day: 1 pack/day  for 15.0 years (15.0 ttl pk-yrs)    Types: Cigarettes    Start date: 12/15/2000    Quit date: 12/16/2015    Years since quitting: 8.6   Smokeless tobacco: Never   Tobacco comments:    on and off smoker  Vaping Use   Vaping status: Never Used  Substance and Sexual Activity   Alcohol use: Yes    Alcohol/week: 0.0 standard drinks of alcohol    Comment: OCCASIONAL ALCOHOL USE.   Drug use: No   Sexual activity: Yes  Other Topics Concern   Not on file  Social History Narrative   The patient lives with husband. 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    Patient drinks occasionally, does not chew tobacco, does not use recreational drugs. She does drink occasional caffeine. She does wear her seatbelt. She does wear bike helmet riding a bike. She  does exercise 3 times a week. She does not wear hearing aids or dentures. There is a smoke alarm in her home. There are no firearms in her home. She feels safe in her relationship. She has never experienced physical abuse.   Patient sleeps 7-8 hours a night.   Patient drinks 2 cups of caffeine daily.   Patient is right handed.   Social Drivers of Health   Tobacco Use: Medium Risk (07/03/2024)   Patient History    Smoking Tobacco Use: Former    Smokeless Tobacco Use: Never    Passive Exposure: Not on file  Financial Resource Strain: Low Risk (06/26/2024)   Overall Financial Resource Strain (CARDIA)    Difficulty of Paying Living Expenses: Not hard at all  Food Insecurity: No Food Insecurity (06/26/2024)   Epic    Worried About Programme Researcher, Broadcasting/film/video in the Last Year: Never true    Ran Out of Food in the Last Year: Never true  Transportation Needs: No Transportation Needs (06/26/2024)   Epic    Lack of Transportation (Medical): No    Lack of Transportation (Non-Medical): No  Physical Activity: Insufficiently Active (06/26/2024)   Exercise Vital Sign    Days of Exercise per Week: 1 day    Minutes of Exercise per Session: 10 min  Stress: No Stress Concern Present (06/26/2024)   [employer] of Occupational Health - Occupational Stress Questionnaire    Feeling of Stress: Only a little  Social Connections: Moderately Integrated (06/26/2024)   Social Connection and Isolation Panel    Frequency of Communication with Friends and Family: Three times a week    Frequency of Social Gatherings with Friends and Family: Three times a week    Attends Religious Services: Never    Active Member of Clubs or Organizations: Yes    Attends Banker Meetings: 1 to 4 times per year    Marital Status: Married  Catering Manager Violence: Not At Risk (12/29/2022)   Humiliation, Afraid, Rape, and Kick questionnaire    Fear of Current or Ex-Partner: No    Emotionally Abused: No    Physically  Abused: No    Sexually Abused: No  Depression (PHQ2-9): Low Risk (07/03/2024)   Depression (PHQ2-9)    PHQ-2 Score: 2  Alcohol Screen: Low Risk (06/26/2024)   Alcohol Screen    Last Alcohol Screening Score (AUDIT): 2  Housing: Low Risk (06/26/2024)   Epic    Unable to Pay for Housing in the Last Year: No    Number of Times Moved in the Last Year: 0  Homeless in the Last Year: No  Utilities: Not At Risk (12/28/2022)   AHC Utilities    Threatened with loss of utilities: No  Health Literacy: Not on file    Past Surgical History:  Procedure Laterality Date   CATARACT SURGERY  07/2009   CESAREAN SECTION     CORONARY ANGIOPLASTY WITH STENT PLACEMENT  2011   HAND SURGERY Left 09/2021   tendon surgery   KNEE SURGERY     leg surgery     squamous ccell removed    TENOSYNOVECTOMY Left 10/26/2021   Procedure: LEFT WRIST EXTENSOR TENOSYNOVECTOMY;  Surgeon: Murrell Drivers, MD;  Location: [facility];  Service: Orthopedics;  Laterality: Left;  45 MIN   TONSILLECTOMY     VESICOVAGINAL FISTULA CLOSURE W/ TAH       Current Medications[1]    Physical Exam: Blood pressure (!) 146/60, pulse (!) 58, height 5' 6 (1.676 m), weight 157 lb (71.2 kg), SpO2 99%.    Affect appropriate Healthy:  appears stated age HEENT: normal Neck supple with no adenopathy JVP normal no bruits no thyromegaly Lungs clear with no wheezing and good diaphragmatic motion Heart:  S1/S2 no murmur, no rub, gallop or click PMI normal Abdomen: benighn, BS positve, no tenderness, no AAA no bruit.  No HSM or HJR Distal pulses intact with no bruits No edema Neuro non-focal Skin warm and dry No muscular weakness  Labs:   Lab Results  Component Value Date   WBC 7.7 07/03/2024   HGB 11.8 (L) 07/03/2024   HCT 35.0 (L) 07/03/2024   MCV 97.2 07/03/2024   PLT 145.0 (L) 07/03/2024    No results for input(s): NA, K, CL, CO2, BUN, CREATININE, CALCIUM , PROT, BILITOT, ALKPHOS, ALT,  AST, GLUCOSE in the last 168 hours.  Invalid input(s): LABALBU  Lab Results  Component Value Date   CKTOTAL 165 09/30/2009   CKMB (HH) 09/30/2009    11.6 CRITICAL VALUE NOTED.  VALUE IS CONSISTENT WITH PREVIOUSLY REPORTED AND CALLED VALUE.   TROPONINI (HH) 09/30/2009    1.72        POSSIBLE MYOCARDIAL ISCHEMIA. SERIAL TESTING RECOMMENDED. CRITICAL VALUE NOTED.  VALUE IS CONSISTENT WITH PREVIOUSLY REPORTED AND CALLED VALUE.    Lab Results  Component Value Date   CHOL 139 12/02/2023   CHOL 168 09/28/2021   CHOL 160 07/09/2020   Lab Results  Component Value Date   HDL 39.20 12/02/2023   HDL 45.80 09/28/2021   HDL 53.10 07/09/2020   Lab Results  Component Value Date   LDLCALC 40 12/02/2023   LDLCALC 73 07/04/2019   LDLCALC 45 05/18/2018   Lab Results  Component Value Date   TRIG 298.0 (H) 12/02/2023   TRIG 266.0 (H) 09/28/2021   TRIG 238.0 (H) 07/09/2020   Lab Results  Component Value Date   CHOLHDL 4 12/02/2023   CHOLHDL 4 09/28/2021   CHOLHDL 3 07/09/2020   Lab Results  Component Value Date   LDLDIRECT 90.0 09/28/2021   LDLDIRECT 77.0 07/09/2020      Radiology: No results found.  EKG: SB rate 58 normal    ASSESSMENT AND PLAN:   1.  CAD -no chest pains  -Recommended to continue current medication regimen including amlodipine  10 mg daily, aspirin  81 mg daily, carvedilol  12.5 mg twice daily, Jardiance  25 mg daily, fenofibrate  160 mg daily, losartan  100 mg daily, nitro as needed, omega-3 fatty acids  1000 mg daily, Crestor  20 mg daily, spironolactone  25 mg daily Given diffuse CAD and prior  stent favor PET/CT to r/o ischemia given 7-8 years since any stress testing    2.  HTN -Blood pressure well-controlled today -Encouraged to continue current medication regimen -Continue heart healthy, low-sodium diet -Increase exercise as tolerated   3.  Diabetes mellitus -A1c was 6.6 improved  -working on diet control -Ozempic  started 07/03/24    4.   MS -stable, not addressed today   5.  Hyperlipidemia - LDL 40 elevated triglycerides 298  -Continue diet modifications for her diabetes - continue crestor  Fenofibrate  started 07/03/24   6.  Smoking -no smoking, quit 4 years ago   7.  Subdural hematoma -no further issues -base to her baseline - distant mechanical fall  08/07/22   PET/CT  F/U in a year if low risk    Signed: Maude Emmer 07/31/2024, 4:31 PM      [1]  Current Outpatient Medications:    amLODipine  (NORVASC ) 10 MG tablet, Take 1 tablet (10 mg total) by mouth daily., Disp: 90 tablet, Rfl: 1   aspirin  EC 81 MG tablet, Take 1 tablet (81 mg total) by mouth daily. Swallow whole., Disp: 90 tablet, Rfl: 3   B Complex Vitamins (B COMPLEX-B12 PO), Take by mouth., Disp: , Rfl:    carvedilol  (COREG ) 12.5 MG tablet, Take 1 tablet (12.5 mg total) by mouth 2 (two) times daily., Disp: 180 tablet, Rfl: 1   cholecalciferol  (VITAMIN D ) 1000 units tablet, Take 2,000 Units by mouth daily., Disp: , Rfl:    empagliflozin  (JARDIANCE ) 25 MG TABS tablet, Take 1 tablet (25 mg total) by mouth daily before breakfast., Disp: 90 tablet, Rfl: 1   fenofibrate  160 MG tablet, Take 1 tablet (160 mg total) by mouth daily., Disp: 90 tablet, Rfl: 3   fish oil-omega-3 fatty acids  1000 MG capsule, Take 1 g by mouth daily., Disp: , Rfl:    glimepiride  (AMARYL ) 4 MG tablet, Take 1 tablet (4 mg total) by mouth daily with breakfast., Disp: 90 tablet, Rfl: 1   levothyroxine  (SYNTHROID ) 112 MCG tablet, 1 tab p.o. daily for 6 days a week, half a tab p.o. 1 day a week, Disp: 90 tablet, Rfl: 3   losartan  (COZAAR ) 100 MG tablet, Take 1 tablet (100 mg total) by mouth daily., Disp: 90 tablet, Rfl: 1   mirtazapine  (REMERON ) 15 MG tablet, TAKE 1 TABLET BY MOUTH EVERYDAY AT BEDTIME, Disp: 90 tablet, Rfl: 1   nitroGLYCERIN  (NITROSTAT ) 0.4 MG SL tablet, PLACE 1 TABLET UNDER THE TONGUE EVERY 5 (FIVE) MINUTES X 3 DOSES AS NEEDED FOR CHEST PAIN., Disp: 25 tablet, Rfl: 4    pantoprazole  (PROTONIX ) 20 MG tablet, Take 1 tablet (20 mg total) by mouth daily., Disp: 90 tablet, Rfl: 1   rosuvastatin  (CRESTOR ) 20 MG tablet, Take 1 tablet (20 mg total) by mouth daily., Disp: 90 tablet, Rfl: 3   Semaglutide , 1 MG/DOSE, 4 MG/3ML SOPN, Inject 1 mg into the skin once a week., Disp: 9 mL, Rfl: 1   spironolactone  (ALDACTONE ) 25 MG tablet, Take 1 tablet (25 mg total) by mouth daily., Disp: 90 tablet, Rfl: 1   venlafaxine  XR (EFFEXOR  XR) 150 MG 24 hr capsule, Take 1 capsule (150 mg total) by mouth daily with breakfast., Disp: 90 capsule, Rfl: 1

## 2024-07-31 ENCOUNTER — Ambulatory Visit: Admitting: Cardiovascular Disease

## 2024-07-31 VITALS — BP 146/60 | HR 58 | Ht 66.0 in | Wt 157.0 lb

## 2024-07-31 DIAGNOSIS — I10 Essential (primary) hypertension: Secondary | ICD-10-CM

## 2024-07-31 DIAGNOSIS — I2510 Atherosclerotic heart disease of native coronary artery without angina pectoris: Secondary | ICD-10-CM

## 2024-07-31 DIAGNOSIS — E782 Mixed hyperlipidemia: Secondary | ICD-10-CM

## 2024-07-31 NOTE — Patient Instructions (Addendum)
 Medication Instructions:  No medication changes were made at this visit. Continue current regimen.   *If you need a refill on your cardiac medications before your next appointment, please call your pharmacy*  Lab Work: None ordered today. If you have labs (blood work) drawn today and your tests are completely normal, you will receive your results only by: MyChart Message (if you have MyChart) OR A paper copy in the mail If you have any lab test that is abnormal or we need to change your treatment, we will call you to review the results.  Testing/Procedures: Your provider has requested that you have a cardiac PET/CT scan.  Follow-Up: At [facility], you and your health needs are our priority.  As part of our continuing mission to provide you with exceptional heart care, our providers are all part of one team.  This team includes your primary Cardiologist (physician) and Advanced Practice Providers or APPs (Physician Assistants and Nurse Practitioners) who all work together to provide you with the care you need, when you need it.  Your next appointment:   1 year(s)  Provider:   Maude Emmer, MD    We recommend signing up for the patient portal called MyChart.  Sign up information is provided on this After Visit Summary.  MyChart is used to connect with patients for Virtual Visits (Telemedicine).  Patients are able to view lab/test results, encounter notes, upcoming appointments, etc.  Non-urgent messages can be sent to your provider as well.   To learn more about what you can do with MyChart, go to [email or web address].   Other Instructions How to Prepare for Your Cardiac PET/CT Stress Test:  1. Please do not take these medications before your test:  ~Medications that may interfere with the cardiac pharmacological stress agent (ex. nitrates - including erectile dysfunction medications, isosorbide mononitrate, tamulosin or beta-blockers) the day of the exam. (Erectile  dysfunction medication should be held for at least 72 hrs prior to test) ~Theophylline containing medications for 12 hours. ~Dipyridamole 48 hours prior to the test. ~Your remaining medications may be taken with water. **HOLD Carvedilol  (Coreg ) the morning of your scan**  Diabetic Preparation: - Hold oral medications. - You may take NPH and Lantus insulin . - Do not take Humalog or Humulin R  (Regular Insulin ) the day of your test. - Check blood sugars prior to leaving the house. - If able to eat breakfast prior to 3 hour fasting, you may take all medications, including your insulin , - Do not worry if you miss your breakfast dose of insulin  - start at your next meal. - Patients who wear a continuous glucose monitor MUST remove the device prior to scanning.  2. Nothing to eat or drink, except water, 3 hours prior to arrival time.   ~ NO caffeine/decaffeinated products, or chocolate 12 hours prior to arrival.  3. NO perfume, cologne or lotion on chest or abdomen area.         - FEMALES - Please avoid wearing dresses to this appointment.  4. Total time is 1 to 2 hours; you may want to bring reading material for the waiting time.  Please report to Radiology at the [facility] Main Entrance 30 minutes early for your test. [address] [place], [place] [address]  In preparation for your appointment, medication and supplies will be purchased.  Appointment availability is limited, so if you need to cancel or reschedule, please call the Radiology Department at [phone] Geroge Long) 24 hours in advance to  avoid a cancellation fee of $100.00  What to Expect After you Arrive:  Once you arrive and check in for your appointment, you will be taken to a preparation room within the Radiology Department.  A technologist or Nurse will obtain your medical history, verify that you are correctly prepped for the exam, and explain the procedure.  Afterwards,  an IV will be started in your arm  and electrodes will be placed on your skin for EKG monitoring during the stress portion of the exam. Then you will be escorted to the PET/CT scanner.  There, staff will get you positioned on the scanner and obtain a blood pressure and EKG.  During the exam, you will continue to be connected to the EKG and blood pressure machines.  A small, safe amount of a radioactive tracer will be injected in your IV to obtain a series of pictures of your heart along with an injection of a stress agent.    After your Exam:  It is recommended that you eat a meal and drink a caffeinated beverage to counter act any effects of the stress agent.  Drink plenty of fluids for the remainder of the day and urinate frequently for the first couple of hours after the exam.  Your doctor will inform you of your test results within 7-10 business days.  For more information and frequently asked questions, please visit our website : [email or web address]  For questions about your test or how to prepare for your test, please call: Cardiac Imaging Nurse Navigators Office: [phone]

## 2024-08-06 ENCOUNTER — Encounter (HOSPITAL_COMMUNITY): Payer: Self-pay

## 2024-08-06 ENCOUNTER — Telehealth (HOSPITAL_COMMUNITY): Payer: Self-pay | Admitting: Emergency Medicine

## 2024-08-06 NOTE — Telephone Encounter (Signed)
 Needs to r/s PET due to being sick. Camie Shutter RN Navigator Cardiac Imaging [facility] [phone] Office  [phone] Cell

## 2024-08-07 ENCOUNTER — Ambulatory Visit (HOSPITAL_COMMUNITY): Admission: RE | Admit: 2024-08-07 | Source: Ambulatory Visit

## 2024-09-18 ENCOUNTER — Other Ambulatory Visit (HOSPITAL_COMMUNITY)

## 2024-11-20 ENCOUNTER — Ambulatory Visit: Admitting: Family Medicine

## 2025-01-30 ENCOUNTER — Encounter

## 2025-02-14 ENCOUNTER — Ambulatory Visit: Admitting: Neurology
